# Patient Record
Sex: Male | Born: 1940 | Race: White | Hispanic: No | Marital: Married | Smoking: Never smoker
Health system: Southern US, Community
[De-identification: ages and names within clinical notes are randomized; demographics above are authoritative.]

## PROBLEM LIST (undated history)

## (undated) DIAGNOSIS — R238 Other skin changes: Secondary | ICD-10-CM

## (undated) DIAGNOSIS — IMO0001 Reserved for inherently not codable concepts without codable children: Secondary | ICD-10-CM

## (undated) DIAGNOSIS — M4310 Spondylolisthesis, site unspecified: Secondary | ICD-10-CM

## (undated) DIAGNOSIS — G629 Polyneuropathy, unspecified: Secondary | ICD-10-CM

## (undated) DIAGNOSIS — M1990 Unspecified osteoarthritis, unspecified site: Secondary | ICD-10-CM

## (undated) DIAGNOSIS — M51379 Other intervertebral disc degeneration, lumbosacral region without mention of lumbar back pain or lower extremity pain: Secondary | ICD-10-CM

## (undated) DIAGNOSIS — C801 Malignant (primary) neoplasm, unspecified: Secondary | ICD-10-CM

## (undated) DIAGNOSIS — M069 Rheumatoid arthritis, unspecified: Secondary | ICD-10-CM

## (undated) DIAGNOSIS — Z5189 Encounter for other specified aftercare: Secondary | ICD-10-CM

## (undated) DIAGNOSIS — K5792 Diverticulitis of intestine, part unspecified, without perforation or abscess without bleeding: Secondary | ICD-10-CM

## (undated) DIAGNOSIS — M5137 Other intervertebral disc degeneration, lumbosacral region: Secondary | ICD-10-CM

## (undated) DIAGNOSIS — K222 Esophageal obstruction: Secondary | ICD-10-CM

## (undated) DIAGNOSIS — J189 Pneumonia, unspecified organism: Secondary | ICD-10-CM

## (undated) DIAGNOSIS — R21 Rash and other nonspecific skin eruption: Secondary | ICD-10-CM

## (undated) DIAGNOSIS — Z87898 Personal history of other specified conditions: Secondary | ICD-10-CM

## (undated) DIAGNOSIS — K219 Gastro-esophageal reflux disease without esophagitis: Secondary | ICD-10-CM

## (undated) DIAGNOSIS — M719 Bursopathy, unspecified: Secondary | ICD-10-CM

## (undated) DIAGNOSIS — I639 Cerebral infarction, unspecified: Secondary | ICD-10-CM

## (undated) DIAGNOSIS — N201 Calculus of ureter: Secondary | ICD-10-CM

## (undated) DIAGNOSIS — N400 Enlarged prostate without lower urinary tract symptoms: Secondary | ICD-10-CM

## (undated) DIAGNOSIS — Z8719 Personal history of other diseases of the digestive system: Secondary | ICD-10-CM

## (undated) DIAGNOSIS — Z87442 Personal history of urinary calculi: Secondary | ICD-10-CM

## (undated) DIAGNOSIS — R233 Spontaneous ecchymoses: Secondary | ICD-10-CM

## (undated) HISTORY — PX: COLONOSCOPY: SHX174

## (undated) HISTORY — PX: NISSEN FUNDOPLICATION: SHX2091

## (undated) HISTORY — DX: Spontaneous ecchymoses: R23.3

## (undated) HISTORY — PX: OTHER SURGICAL HISTORY: SHX169

## (undated) HISTORY — DX: Polyneuropathy, unspecified: G62.9

## (undated) HISTORY — DX: Diverticulitis of intestine, part unspecified, without perforation or abscess without bleeding: K57.92

## (undated) HISTORY — PX: HERNIA REPAIR: SHX51

## (undated) HISTORY — DX: Calculus of ureter: N20.1

## (undated) HISTORY — DX: Other skin changes: R23.8

## (undated) HISTORY — DX: Cerebral infarction, unspecified: I63.9

---

## 1997-09-18 ENCOUNTER — Emergency Department (HOSPITAL_COMMUNITY): Admission: EM | Admit: 1997-09-18 | Discharge: 1997-09-18 | Payer: Self-pay | Admitting: Emergency Medicine

## 1997-11-04 ENCOUNTER — Ambulatory Visit (HOSPITAL_COMMUNITY): Admission: RE | Admit: 1997-11-04 | Discharge: 1997-11-04 | Payer: Self-pay | Admitting: Gastroenterology

## 1997-12-09 ENCOUNTER — Inpatient Hospital Stay (HOSPITAL_COMMUNITY): Admission: RE | Admit: 1997-12-09 | Discharge: 1997-12-14 | Payer: Self-pay | Admitting: General Surgery

## 2000-01-10 ENCOUNTER — Encounter: Payer: Self-pay | Admitting: Rheumatology

## 2000-01-10 ENCOUNTER — Encounter: Admission: RE | Admit: 2000-01-10 | Discharge: 2000-01-10 | Payer: Self-pay | Admitting: Rheumatology

## 2000-07-20 ENCOUNTER — Encounter: Payer: Self-pay | Admitting: Emergency Medicine

## 2000-07-20 ENCOUNTER — Emergency Department (HOSPITAL_COMMUNITY): Admission: EM | Admit: 2000-07-20 | Discharge: 2000-07-20 | Payer: Self-pay | Admitting: Emergency Medicine

## 2000-11-06 ENCOUNTER — Inpatient Hospital Stay (HOSPITAL_COMMUNITY): Admission: EM | Admit: 2000-11-06 | Discharge: 2000-11-07 | Payer: Self-pay | Admitting: *Deleted

## 2000-11-06 ENCOUNTER — Encounter: Payer: Self-pay | Admitting: Internal Medicine

## 2000-11-26 ENCOUNTER — Ambulatory Visit (HOSPITAL_COMMUNITY): Admission: RE | Admit: 2000-11-26 | Discharge: 2000-11-26 | Payer: Self-pay | Admitting: Oncology

## 2000-11-26 ENCOUNTER — Encounter: Payer: Self-pay | Admitting: Oncology

## 2000-11-28 ENCOUNTER — Other Ambulatory Visit: Admission: RE | Admit: 2000-11-28 | Discharge: 2000-11-28 | Payer: Self-pay | Admitting: Oncology

## 2000-11-28 ENCOUNTER — Encounter (INDEPENDENT_AMBULATORY_CARE_PROVIDER_SITE_OTHER): Payer: Self-pay | Admitting: Specialist

## 2001-04-17 HISTORY — PX: TOTAL KNEE ARTHROPLASTY: SHX125

## 2001-05-29 ENCOUNTER — Emergency Department (HOSPITAL_COMMUNITY): Admission: EM | Admit: 2001-05-29 | Discharge: 2001-05-29 | Payer: Self-pay | Admitting: Emergency Medicine

## 2001-06-03 ENCOUNTER — Encounter: Payer: Self-pay | Admitting: Orthopedic Surgery

## 2001-06-05 ENCOUNTER — Inpatient Hospital Stay (HOSPITAL_COMMUNITY): Admission: RE | Admit: 2001-06-05 | Discharge: 2001-06-10 | Payer: Self-pay | Admitting: Orthopedic Surgery

## 2001-06-07 ENCOUNTER — Encounter: Payer: Self-pay | Admitting: Orthopedic Surgery

## 2002-03-12 ENCOUNTER — Encounter: Payer: Self-pay | Admitting: Emergency Medicine

## 2002-03-12 ENCOUNTER — Inpatient Hospital Stay (HOSPITAL_COMMUNITY): Admission: EM | Admit: 2002-03-12 | Discharge: 2002-03-13 | Payer: Self-pay | Admitting: Emergency Medicine

## 2002-03-13 ENCOUNTER — Encounter: Payer: Self-pay | Admitting: Geriatric Medicine

## 2002-04-01 ENCOUNTER — Encounter: Payer: Self-pay | Admitting: General Surgery

## 2002-04-01 ENCOUNTER — Ambulatory Visit (HOSPITAL_COMMUNITY): Admission: RE | Admit: 2002-04-01 | Discharge: 2002-04-01 | Payer: Self-pay | Admitting: General Surgery

## 2002-04-08 ENCOUNTER — Encounter: Payer: Self-pay | Admitting: General Surgery

## 2002-04-08 ENCOUNTER — Ambulatory Visit (HOSPITAL_COMMUNITY): Admission: RE | Admit: 2002-04-08 | Discharge: 2002-04-08 | Payer: Self-pay | Admitting: General Surgery

## 2002-08-28 ENCOUNTER — Ambulatory Visit (HOSPITAL_COMMUNITY): Admission: RE | Admit: 2002-08-28 | Discharge: 2002-08-28 | Payer: Self-pay | Admitting: Gastroenterology

## 2003-08-03 ENCOUNTER — Ambulatory Visit (HOSPITAL_COMMUNITY): Admission: RE | Admit: 2003-08-03 | Discharge: 2003-08-03 | Payer: Self-pay | Admitting: Oncology

## 2004-01-28 ENCOUNTER — Ambulatory Visit (HOSPITAL_COMMUNITY): Admission: RE | Admit: 2004-01-28 | Discharge: 2004-01-28 | Payer: Self-pay | Admitting: Gastroenterology

## 2004-01-28 ENCOUNTER — Encounter (INDEPENDENT_AMBULATORY_CARE_PROVIDER_SITE_OTHER): Payer: Self-pay | Admitting: Specialist

## 2004-07-18 ENCOUNTER — Encounter: Admission: RE | Admit: 2004-07-18 | Discharge: 2004-07-18 | Payer: Self-pay | Admitting: Geriatric Medicine

## 2004-07-27 ENCOUNTER — Encounter: Admission: RE | Admit: 2004-07-27 | Discharge: 2004-07-27 | Payer: Self-pay | Admitting: Geriatric Medicine

## 2005-04-17 HISTORY — PX: INGUINAL HERNIA REPAIR: SUR1180

## 2005-11-26 ENCOUNTER — Emergency Department (HOSPITAL_COMMUNITY): Admission: EM | Admit: 2005-11-26 | Discharge: 2005-11-26 | Payer: Self-pay | Admitting: Emergency Medicine

## 2005-11-29 ENCOUNTER — Ambulatory Visit (HOSPITAL_BASED_OUTPATIENT_CLINIC_OR_DEPARTMENT_OTHER): Admission: RE | Admit: 2005-11-29 | Discharge: 2005-11-29 | Payer: Self-pay | Admitting: Urology

## 2005-11-30 ENCOUNTER — Ambulatory Visit (HOSPITAL_COMMUNITY): Admission: RE | Admit: 2005-11-30 | Discharge: 2005-11-30 | Payer: Self-pay | Admitting: Urology

## 2006-01-17 ENCOUNTER — Inpatient Hospital Stay (HOSPITAL_COMMUNITY): Admission: RE | Admit: 2006-01-17 | Discharge: 2006-01-19 | Payer: Self-pay | Admitting: Orthopedic Surgery

## 2006-05-09 ENCOUNTER — Encounter: Admission: RE | Admit: 2006-05-09 | Discharge: 2006-05-09 | Payer: Self-pay | Admitting: Geriatric Medicine

## 2006-06-14 ENCOUNTER — Inpatient Hospital Stay (HOSPITAL_COMMUNITY): Admission: EM | Admit: 2006-06-14 | Discharge: 2006-06-20 | Payer: Self-pay | Admitting: Emergency Medicine

## 2006-06-15 ENCOUNTER — Encounter (INDEPENDENT_AMBULATORY_CARE_PROVIDER_SITE_OTHER): Payer: Self-pay | Admitting: *Deleted

## 2006-08-28 ENCOUNTER — Inpatient Hospital Stay (HOSPITAL_COMMUNITY): Admission: EM | Admit: 2006-08-28 | Discharge: 2006-08-30 | Payer: Self-pay | Admitting: Emergency Medicine

## 2007-04-18 HISTORY — PX: OTHER SURGICAL HISTORY: SHX169

## 2007-07-31 ENCOUNTER — Emergency Department (HOSPITAL_COMMUNITY): Admission: EM | Admit: 2007-07-31 | Discharge: 2007-07-31 | Payer: Self-pay | Admitting: Emergency Medicine

## 2007-09-11 ENCOUNTER — Encounter: Admission: RE | Admit: 2007-09-11 | Discharge: 2007-09-11 | Payer: Self-pay | Admitting: Geriatric Medicine

## 2008-02-19 ENCOUNTER — Ambulatory Visit (HOSPITAL_COMMUNITY): Admission: RE | Admit: 2008-02-19 | Discharge: 2008-02-19 | Payer: Self-pay | Admitting: General Surgery

## 2008-03-24 ENCOUNTER — Ambulatory Visit (HOSPITAL_COMMUNITY): Admission: RE | Admit: 2008-03-24 | Discharge: 2008-03-24 | Payer: Self-pay | Admitting: Gastroenterology

## 2008-04-30 ENCOUNTER — Ambulatory Visit (HOSPITAL_COMMUNITY): Admission: RE | Admit: 2008-04-30 | Discharge: 2008-05-01 | Payer: Self-pay | Admitting: General Surgery

## 2009-09-20 ENCOUNTER — Encounter: Admission: RE | Admit: 2009-09-20 | Discharge: 2009-09-20 | Payer: Self-pay | Admitting: Geriatric Medicine

## 2009-09-29 ENCOUNTER — Encounter: Admission: RE | Admit: 2009-09-29 | Discharge: 2009-09-29 | Payer: Self-pay | Admitting: Geriatric Medicine

## 2009-09-29 ENCOUNTER — Other Ambulatory Visit: Admission: RE | Admit: 2009-09-29 | Discharge: 2009-09-29 | Payer: Self-pay | Admitting: Interventional Radiology

## 2009-12-17 ENCOUNTER — Encounter (HOSPITAL_COMMUNITY): Admission: RE | Admit: 2009-12-17 | Discharge: 2010-01-06 | Payer: Self-pay | Admitting: Rheumatology

## 2010-05-06 ENCOUNTER — Ambulatory Visit (HOSPITAL_BASED_OUTPATIENT_CLINIC_OR_DEPARTMENT_OTHER): Payer: MEDICARE | Admitting: Oncology

## 2010-05-08 ENCOUNTER — Encounter: Payer: Self-pay | Admitting: Geriatric Medicine

## 2010-05-09 ENCOUNTER — Other Ambulatory Visit: Payer: Self-pay | Admitting: Dermatology

## 2010-05-23 ENCOUNTER — Encounter (HOSPITAL_BASED_OUTPATIENT_CLINIC_OR_DEPARTMENT_OTHER): Payer: MEDICARE | Admitting: Oncology

## 2010-05-23 ENCOUNTER — Other Ambulatory Visit: Payer: Self-pay | Admitting: Oncology

## 2010-05-23 DIAGNOSIS — D649 Anemia, unspecified: Secondary | ICD-10-CM

## 2010-05-23 LAB — MORPHOLOGY: PLT EST: ADEQUATE

## 2010-05-23 LAB — CBC & DIFF AND RETIC
Basophils Absolute: 0 10*3/uL (ref 0.0–0.1)
EOS%: 0.3 % (ref 0.0–7.0)
Eosinophils Absolute: 0 10*3/uL (ref 0.0–0.5)
HCT: 29.1 % — ABNORMAL LOW (ref 38.4–49.9)
MCH: 17.9 pg — ABNORMAL LOW (ref 27.2–33.4)
MCHC: 27.1 g/dL — ABNORMAL LOW (ref 32.0–36.0)
MCV: 65.8 fL — ABNORMAL LOW (ref 79.3–98.0)
MONO#: 1 10*3/uL — ABNORMAL HIGH (ref 0.1–0.9)
RBC: 4.42 10*6/uL (ref 4.20–5.82)
RDW: 17.8 % — ABNORMAL HIGH (ref 11.0–14.6)
Retic %: 1.01 % (ref 0.50–1.60)
lymph#: 2.5 10*3/uL (ref 0.9–3.3)

## 2010-05-25 ENCOUNTER — Encounter (HOSPITAL_BASED_OUTPATIENT_CLINIC_OR_DEPARTMENT_OTHER): Payer: MEDICARE | Admitting: Oncology

## 2010-05-25 DIAGNOSIS — D649 Anemia, unspecified: Secondary | ICD-10-CM

## 2010-05-25 LAB — PROTEIN ELECTROPHORESIS, SERUM
Alpha-1-Globulin: 3.5 % (ref 2.9–4.9)
Alpha-2-Globulin: 7.1 % (ref 7.1–11.8)
Beta Globulin: 5.4 % (ref 4.7–7.2)
Total Protein, Serum Electrophoresis: 7.9 g/dL (ref 6.0–8.3)

## 2010-05-25 LAB — IRON AND TIBC: UIBC: 368 ug/dL

## 2010-05-25 LAB — SEDIMENTATION RATE: Sed Rate: 5 mm/hr (ref 0–16)

## 2010-05-25 LAB — VITAMIN B12: Vitamin B-12: 532 pg/mL (ref 211–911)

## 2010-05-25 LAB — FERRITIN: Ferritin: 3 ng/mL — ABNORMAL LOW (ref 22–322)

## 2010-05-26 LAB — IGG, IGA, IGM: IgA: 91 mg/dL (ref 68–378)

## 2010-06-01 ENCOUNTER — Encounter (HOSPITAL_BASED_OUTPATIENT_CLINIC_OR_DEPARTMENT_OTHER): Payer: MEDICARE | Admitting: Oncology

## 2010-06-01 DIAGNOSIS — D649 Anemia, unspecified: Secondary | ICD-10-CM

## 2010-06-02 ENCOUNTER — Encounter (HOSPITAL_BASED_OUTPATIENT_CLINIC_OR_DEPARTMENT_OTHER): Payer: MEDICARE | Admitting: Oncology

## 2010-06-02 ENCOUNTER — Other Ambulatory Visit (HOSPITAL_COMMUNITY)
Admission: RE | Admit: 2010-06-02 | Discharge: 2010-06-02 | Disposition: A | Discharge status: Home / Self Care | Payer: Medicare Other | Source: Ambulatory Visit | Attending: Oncology | Admitting: Oncology

## 2010-06-02 ENCOUNTER — Other Ambulatory Visit: Payer: Self-pay | Admitting: Oncology

## 2010-06-02 DIAGNOSIS — D649 Anemia, unspecified: Secondary | ICD-10-CM

## 2010-06-02 DIAGNOSIS — D472 Monoclonal gammopathy: Secondary | ICD-10-CM | POA: Insufficient documentation

## 2010-06-02 LAB — CBC & DIFF AND RETIC
Basophils Absolute: 0.1 10*3/uL (ref 0.0–0.1)
Eosinophils Absolute: 0.2 10*3/uL (ref 0.0–0.5)
HCT: 32.8 % — ABNORMAL LOW (ref 38.4–49.9)
LYMPH%: 32.1 % (ref 14.0–49.0)
MCH: 18.9 pg — ABNORMAL LOW (ref 27.2–33.4)
MCV: 70.4 fL — ABNORMAL LOW (ref 79.3–98.0)
MONO#: 0.9 10*3/uL (ref 0.1–0.9)
MONO%: 14.7 % — ABNORMAL HIGH (ref 0.0–14.0)
Platelets: 197 10*3/uL (ref 140–400)
RBC: 4.66 10*6/uL (ref 4.20–5.82)
RDW: 23.5 % — ABNORMAL HIGH (ref 11.0–14.6)
Retic %: 1.62 % — ABNORMAL HIGH (ref 0.50–1.60)
Retic Ct Abs: 75.49 10*3/uL (ref 24.10–77.50)
lymph#: 2 10*3/uL (ref 0.9–3.3)

## 2010-06-03 ENCOUNTER — Ambulatory Visit: Payer: MEDICARE | Admitting: Cardiovascular Disease

## 2010-07-20 ENCOUNTER — Other Ambulatory Visit: Payer: Self-pay | Admitting: Oncology

## 2010-07-20 ENCOUNTER — Encounter (HOSPITAL_BASED_OUTPATIENT_CLINIC_OR_DEPARTMENT_OTHER): Payer: MEDICARE | Admitting: Oncology

## 2010-07-20 DIAGNOSIS — D509 Iron deficiency anemia, unspecified: Secondary | ICD-10-CM

## 2010-07-20 DIAGNOSIS — M069 Rheumatoid arthritis, unspecified: Secondary | ICD-10-CM

## 2010-07-20 DIAGNOSIS — D649 Anemia, unspecified: Secondary | ICD-10-CM

## 2010-07-20 LAB — CBC & DIFF AND RETIC
BASO%: 0.3 % (ref 0.0–2.0)
Basophils Absolute: 0 10*3/uL (ref 0.0–0.1)
Immature Retic Fract: 3 % (ref 0.00–13.40)
MCH: 22.3 pg — ABNORMAL LOW (ref 27.2–33.4)
MCHC: 29.2 g/dL — ABNORMAL LOW (ref 32.0–36.0)
MCV: 76.2 fL — ABNORMAL LOW (ref 79.3–98.0)
MONO#: 1.1 10*3/uL — ABNORMAL HIGH (ref 0.1–0.9)
NEUT#: 6.3 10*3/uL (ref 1.5–6.5)
NEUT%: 62.1 % (ref 39.0–75.0)
WBC: 10.1 10*3/uL (ref 4.0–10.3)
lymph#: 2.6 10*3/uL (ref 0.9–3.3)

## 2010-07-20 LAB — MORPHOLOGY: PLT EST: ADEQUATE

## 2010-07-20 LAB — IRON AND TIBC
TIBC: 328 ug/dL (ref 215–435)
UIBC: 310 ug/dL

## 2010-07-28 ENCOUNTER — Encounter (HOSPITAL_BASED_OUTPATIENT_CLINIC_OR_DEPARTMENT_OTHER): Payer: MEDICARE | Admitting: Oncology

## 2010-07-28 DIAGNOSIS — D649 Anemia, unspecified: Secondary | ICD-10-CM

## 2010-08-01 LAB — CBC
MCHC: 30.6 g/dL (ref 30.0–36.0)
MCV: 65.5 fL — ABNORMAL LOW (ref 78.0–100.0)
Platelets: 322 10*3/uL (ref 150–400)
WBC: 7.2 10*3/uL (ref 4.0–10.5)

## 2010-08-01 LAB — DIFFERENTIAL
Basophils Absolute: 0 10*3/uL (ref 0.0–0.1)
Basophils Relative: 0 % (ref 0–1)
Eosinophils Relative: 3 % (ref 0–5)
Lymphocytes Relative: 22 % (ref 12–46)
Lymphs Abs: 1.6 10*3/uL (ref 0.7–4.0)
Monocytes Relative: 15 % — ABNORMAL HIGH (ref 3–12)
Neutro Abs: 4.3 10*3/uL (ref 1.7–7.7)

## 2010-08-01 LAB — COMPREHENSIVE METABOLIC PANEL
AST: 19 U/L (ref 0–37)
Albumin: 3.2 g/dL — ABNORMAL LOW (ref 3.5–5.2)
Calcium: 9.5 mg/dL (ref 8.4–10.5)
Creatinine, Ser: 0.99 mg/dL (ref 0.4–1.5)
GFR calc Af Amer: >60 mL/min (ref >60)

## 2010-08-01 LAB — PROTIME-INR
INR: 1.2 (ref 0.00–1.49)
Prothrombin Time: 15.7 seconds — ABNORMAL HIGH (ref 11.6–15.2)

## 2010-08-02 ENCOUNTER — Inpatient Hospital Stay (HOSPITAL_COMMUNITY)
Admission: EM | Admit: 2010-08-02 | Discharge: 2010-08-06 | DRG: 982 | Disposition: A | Discharge status: Home / Self Care | Payer: MEDICARE | Attending: Internal Medicine | Admitting: Internal Medicine

## 2010-08-02 DIAGNOSIS — Z880 Allergy status to penicillin: Secondary | ICD-10-CM

## 2010-08-02 DIAGNOSIS — K5731 Diverticulosis of large intestine without perforation or abscess with bleeding: Principal | ICD-10-CM | POA: Diagnosis present

## 2010-08-02 DIAGNOSIS — D638 Anemia in other chronic diseases classified elsewhere: Secondary | ICD-10-CM | POA: Diagnosis present

## 2010-08-02 DIAGNOSIS — M069 Rheumatoid arthritis, unspecified: Secondary | ICD-10-CM | POA: Diagnosis present

## 2010-08-02 DIAGNOSIS — IMO0002 Reserved for concepts with insufficient information to code with codable children: Secondary | ICD-10-CM

## 2010-08-02 DIAGNOSIS — D696 Thrombocytopenia, unspecified: Secondary | ICD-10-CM | POA: Diagnosis present

## 2010-08-02 DIAGNOSIS — Z882 Allergy status to sulfonamides status: Secondary | ICD-10-CM

## 2010-08-02 DIAGNOSIS — L299 Pruritus, unspecified: Secondary | ICD-10-CM | POA: Diagnosis not present

## 2010-08-02 DIAGNOSIS — D62 Acute posthemorrhagic anemia: Secondary | ICD-10-CM | POA: Diagnosis present

## 2010-08-02 LAB — POCT I-STAT, CHEM 8
Chloride: 106 mEq/L (ref 96–112)
HCT: 42 % (ref 39.0–52.0)
Hemoglobin: 14.3 g/dL (ref 13.0–17.0)
Potassium: 4.5 mEq/L (ref 3.5–5.1)
Sodium: 141 mEq/L (ref 135–145)

## 2010-08-02 LAB — DIFFERENTIAL
Basophils Absolute: 0 10*3/uL (ref 0.0–0.1)
Eosinophils Relative: 1 % (ref 0–5)
Lymphocytes Relative: 26 % (ref 12–46)
Lymphs Abs: 2.8 10*3/uL (ref 0.7–4.0)
Monocytes Relative: 10 % (ref 3–12)
Neutrophils Relative %: 63 % (ref 43–77)

## 2010-08-02 LAB — CBC
HCT: 29.3 % — ABNORMAL LOW (ref 39.0–52.0)
Hemoglobin: 11.8 g/dL — ABNORMAL LOW (ref 13.0–17.0)
MCH: 23.6 pg — ABNORMAL LOW (ref 26.0–34.0)
MCHC: 30 g/dL (ref 30.0–36.0)
Platelets: 289 10*3/uL (ref 150–400)
Platelets: 245 10*3/uL (ref 150–400)
RBC: 5.01 MIL/uL (ref 4.22–5.81)
RDW: 22 % — ABNORMAL HIGH (ref 11.5–15.5)
WBC: 9.9 10*3/uL (ref 4.0–10.5)

## 2010-08-02 LAB — PREPARE RBC (CROSSMATCH)

## 2010-08-02 LAB — OCCULT BLOOD, POC DEVICE: Fecal Occult Bld: POSITIVE

## 2010-08-03 LAB — CBC
Hemoglobin: 8.2 g/dL — ABNORMAL LOW (ref 13.0–17.0)
MCHC: 31.3 g/dL (ref 30.0–36.0)
MCV: 77.5 fL — ABNORMAL LOW (ref 78.0–100.0)
Platelets: 224 10*3/uL (ref 150–400)
RDW: 21 % — ABNORMAL HIGH (ref 11.5–15.5)
RDW: 21.1 % — ABNORMAL HIGH (ref 11.5–15.5)
WBC: 9.2 10*3/uL (ref 4.0–10.5)
WBC: 7.8 10*3/uL (ref 4.0–10.5)

## 2010-08-03 LAB — HEMOGLOBIN AND HEMATOCRIT, BLOOD
HCT: 24.9 % — ABNORMAL LOW (ref 39.0–52.0)
HCT: 24.6 % — ABNORMAL LOW (ref 39.0–52.0)
Hemoglobin: 7.8 g/dL — ABNORMAL LOW (ref 13.0–17.0)

## 2010-08-03 LAB — BASIC METABOLIC PANEL
BUN: 17 mg/dL (ref 6–23)
GFR calc Af Amer: >60 mL/min (ref >60)
GFR calc non Af Amer: >60 mL/min (ref >60)
Potassium: 4.2 mEq/L (ref 3.5–5.1)

## 2010-08-03 LAB — PREPARE RBC (CROSSMATCH)

## 2010-08-04 ENCOUNTER — Inpatient Hospital Stay (HOSPITAL_COMMUNITY): Payer: MEDICARE

## 2010-08-04 LAB — HEMOGLOBIN AND HEMATOCRIT, BLOOD
HCT: 23 % — ABNORMAL LOW (ref 39.0–52.0)
Hemoglobin: 7.2 g/dL — ABNORMAL LOW (ref 13.0–17.0)
Hemoglobin: 7.8 g/dL — ABNORMAL LOW (ref 13.0–17.0)

## 2010-08-04 LAB — BASIC METABOLIC PANEL
CO2: 25 mEq/L (ref 19–32)
Calcium: 7.8 mg/dL — ABNORMAL LOW (ref 8.4–10.5)
Creatinine, Ser: 0.96 mg/dL (ref 0.4–1.5)
Glucose, Bld: 136 mg/dL — ABNORMAL HIGH (ref 70–99)

## 2010-08-04 LAB — CARDIAC PANEL(CRET KIN+CKTOT+MB+TROPI)
CK, MB: 1.2 ng/mL (ref 0.3–4.0)
CK, MB: 1.7 ng/mL (ref 0.3–4.0)
Relative Index: INVALID (ref 0.0–2.5)
Troponin I: 0.01 ng/mL (ref 0.00–0.06)

## 2010-08-04 MED ORDER — IOHEXOL 300 MG/ML  SOLN
300.0000 mL | Freq: Once | INTRAMUSCULAR | Status: AC | PRN
  Administered 2010-08-04: 65 mL via INTRA_ARTERIAL

## 2010-08-04 NOTE — H&P (Signed)
  NAME:  [name], [name]                ACCOUNT NO.:  [id number]  MEDICAL RECORD NO.:  [id number]           PATIENT TYPE:  E  LOCATION:  [facility]                         FACILITY:  [facility]  PHYSICIAN:  Lonia Blood, M.D.       DATE OF BIRTH:  1941-03-02  DATE OF ADMISSION:  08/02/2010 DATE OF DISCHARGE:                             HISTORY & PHYSICAL   PRIMARY CARE PHYSICIAN:  Hal T. Stoneking, MD  CHIEF COMPLAINT:  Passing blood through the rectum.  HISTORY OF PRESENT ILLNESS:  [name] is a 70 year old gentleman with history of rheumatoid arthritis and multiple abdominal surgeries, lysis of adhesions, recurrent incisional hernias, who presented to the emergency room after he started having significant hematochezia.  It is painless.  He has no fevers.  No chills.  PAST MEDICAL HISTORY: 1. Rheumatoid arthritis. 2. Osteoarthritis. 3. History of kidney stones. 4. History of gastric ulcers. 5. Chronic steroid usage. 6. Left total knee replacement. 7. Benign prostatic hypertrophy. 8. Right inguinal hernia repair. 9. Left inguinal hernia repair. 10.Nissen fundoplication. 11.Recurrent ventral incisional hernia repair in June 2010.  HOME MEDICATIONS:  Prednisone, Tylenol, calcium, multivitamin, and Prilosec.  ALLERGIES:  CLINDAMYCIN, CODEINE, and PENICILLIN.  SOCIAL HISTORY:  He is married, lives with his wife.  FAMILY HISTORY:  Positive for hypertension.  PHYSICAL EXAMINATION:  VITAL SIGNS:  Upon admission, temperature 98.2, blood pressure 109/71, pulse 63, respirations 19, and saturation 93% on room air. GENERAL:  The patient is alert and oriented, in no acute distress. HEAD:  Normocephalic and atraumatic. EYES:  Pupils equal, round reactive to light and accommodation. Extraocular movements are intact. THROAT:  Clear. NECK:  Supple.  No JVD. CHEST:  Clear to auscultation.  No wheeze, rhonchi, or crackles. HEART:  Regular rate and rhythm without murmurs, rubs, or  gallops. ABDOMEN:  Soft.  There is some hyperactive bowel sounds.  Nontender. LOWER EXTREMITIES:  Without edema. SKIN:  Warm and dry.  No suspicious looking rashes.  LABORATORY DATA:  Laboratory values on admission was white blood cell count is 10.9, hemoglobin 11.8, and platelet count is 289.  Sodium is 141, potassium 4.5, chloride 106, bicarbonate is 25, BUN 23, creatinine 1, and glucose 119.  ASSESSMENT/PLAN:  This is a 70 year old gentleman admitted with lower gastrointestinal bleeding, most likely diverticular in nature.  He does not seem to have upper gastrointestinal bleeding due to the fact of lack of vomiting, hemodynamic stability, and color of the blood.  Since he has been on chronic steroids, we will proceed with admission to telemetry unit.  Give him stress dose steroids.  Transfuse him 2 units packed red blood cells and follow closely to see if he is going to require any colonoscopy or upper endoscopy.  Gastroenterologist on call. Dr. Jeani Hawking, has been alerted about this admission by the emergency room physician.     Lonia Blood, M.D.   SL/MEDQ  D:  08/02/2010  T:  08/02/2010  Job:  [id number]  Electronically Signed by Lonia Blood M.D. on 08/04/2010 02:27:18 PM

## 2010-08-05 LAB — VITAMIN B12: Vitamin B-12: 326 pg/mL (ref 211–911)

## 2010-08-05 LAB — IRON AND TIBC
TIBC: 177 ug/dL — ABNORMAL LOW (ref 215–435)
UIBC: 145 ug/dL

## 2010-08-05 LAB — HEMOGLOBIN AND HEMATOCRIT, BLOOD: HCT: 23.7 % — ABNORMAL LOW (ref 39.0–52.0)

## 2010-08-05 LAB — FOLATE: Folate: 11.3 ng/mL

## 2010-08-05 LAB — PREPARE RBC (CROSSMATCH)

## 2010-08-06 LAB — TYPE AND SCREEN
ABO/RH(D): A NEG
Antibody Screen: NEGATIVE
Unit division: 0
Unit division: 0
Unit division: 0
Unit division: 0

## 2010-08-06 LAB — BASIC METABOLIC PANEL
BUN: 11 mg/dL (ref 6–23)
CO2: 26 mEq/L (ref 19–32)
Calcium: 7.8 mg/dL — ABNORMAL LOW (ref 8.4–10.5)
Creatinine, Ser: 0.93 mg/dL (ref 0.4–1.5)
GFR calc non Af Amer: >60 mL/min (ref >60)
Glucose, Bld: 110 mg/dL — ABNORMAL HIGH (ref 70–99)

## 2010-08-06 LAB — CBC
HCT: 23.7 % — ABNORMAL LOW (ref 39.0–52.0)
Hemoglobin: 8.1 g/dL — ABNORMAL LOW (ref 13.0–17.0)
MCH: 27.8 pg (ref 26.0–34.0)
MCHC: 34.2 g/dL (ref 30.0–36.0)
MCV: 81.4 fL (ref 78.0–100.0)
RDW: 18.1 % — ABNORMAL HIGH (ref 11.5–15.5)

## 2010-08-07 NOTE — Consult Note (Signed)
NAME:  [name], [name]                ACCOUNT NO.:  [id number]  MEDICAL RECORD NO.:  [id number]           PATIENT TYPE:  I  LOCATION:  6737                         FACILITY:  [facility]  PHYSICIAN:  Bogard Hawks. Elnoria Howard, MD    DATE OF BIRTH:  1940-12-31  DATE OF CONSULTATION:  08/02/2010 DATE OF DISCHARGE:                                CONSULTATION   REASON FOR CONSULTATION:  Hematochezia.  PRIMARY GASTROINTESTINAL PHYSICIAN:  Danise Edge, MD  PRIMARY CARE PROVIDER:  Hal T. Stoneking, MD  HEMATOLOGIST:  Pierce Crane, MD, FRCPC  HISTORY OF PRESENT ILLNESS:  This is a 70 year old gentleman with a past medical history of rheumatoid arthritis, history of small bowel obstruction status post lysis of adhesions, inguinal hernia repair, ventral hernia repair, history of iron-deficiency anemia, and history of vertigo who was admitted to the hospital with complaints of hematochezia.  The patient states that he started to have pain with hematochezia that was quite prominent starting at 2 p.m.  Prior to this time, he had a couple of bowel movements in the morning where he saw just a slight amount of blood but since 2 p.m., he started to have significant amount of bleeding.  This started to concern him after 2 large bloody bowel movements and subsequently presented to the emergency room for further evaluation and treatment.  While in the emergency room and also the time before he was evaluated, he had a total of 6 other bowel movements which brings a total of 8 bloody bowel movements at this time.  Currently, the patient denies having any further hematochezia and he is hemodynamically stable.  No complaints of any chest pain, abdominal pain, shortness of breath, nausea, vomiting, fevers, or chills.  In 2004, per the patient, he underwent a colonoscopy by Dr. Laural Benes with findings of some diverticula and a prior CT scan in the years prior does confirm that he had sigmoid diverticulosis.  As a result  of his symptoms, GI consultation was requested.  However, this has been an inadvertent consultation as the patient should have been seen by Dr. Laural Benes and or his partners at [facility].  PAST MEDICAL HISTORY:  As stated above.  PAST SURGICAL HISTORY:  As stated above.  FAMILY HISTORY:  Noncontributory.  SOCIAL HISTORY:  Negative for alcohol, tobacco, illicit drug use.  FAMILY HISTORY:  Noncontributory.  REVIEW OF SYSTEMS:  As stated above in history present illness. Otherwise negative.  ALLERGIES:  CODEINE, PENICILLIN, SULFA, and CLINDAMYCIN.  MEDICATIONS: 1. Hydrocortisone 25 mg IV q.12 h. 2. Protonix 40 mg IV daily. 3. Morphine 2 mg IV q.4 h. 4. Zofran 4 mg IV q.6 h.  PHYSICAL EXAMINATION:  VITAL SIGNS:  Blood pressure is 106/61, heart rate is 66, respirations 17, temperature is 97.9. GENERAL:  The patient is in no acute distress, alert and oriented. HEENT:  Normocephalic, atraumatic.  Extraocular muscles intact. NECK:  Supple.  No lymphadenopathy. LUNGS:  Clear to auscultation bilaterally. CARDIOVASCULAR:  Regular rate and rhythm. ABDOMEN:  Mildly obese, soft, nontender, nondistended. EXTREMITIES:  No clubbing, cyanosis, or edema. RECTAL:  Performed in ER.  Deferred at this  time, but is reported to be grossly bloody.  LABORATORY VALUES:  White blood cell count 9.9, hemoglobin 8.8, MCV 76.9, platelets at 246.  Sodium 141, potassium 4.5, chloride 106, glucose is 119, BUN 23, creatinine is 1.0.  Hemoccult positive.  IMPRESSION: 1. Probable diverticular bleed. 2. History of iron-deficiency anemia. 3. Rheumatoid arthritis. 4. History of small bowel obstruction status post lysis of adhesion.     Given the patient's acute onset of hematochezia and confirmation of     diverticula in the sigmoid colon, I believe that this is consistent     with a diverticular bleed.  Currently, he is hemodynamically     stable.  There is no evidence that this may be an ischemic colitis.      The patient has been worked up by Dr. Pierce Crane in the past with     bone marrow biopsy in August 2002 and most recently in February     2012 as he has a persistent microcytic anemia.  The bone marrow     biopsies are essentially negative and he has also had upper     endoscopies by Dr. Laural Benes in 2004 which were negative for celiac     disease.  In fact, the patient was supposed to see Dr. Laural Benes on     August 04, 2010 for an office visit to consider repeat evaluation of     a GI source for his microcytic anemia.  Plan at this time is supportive care.  I believe he will require a repeat colonoscopy with the recent bleeding and the fact that his last colonoscopy was 8 years ago.  He does have a persistent microcytic anemia.  Apparently, Dr. Donnie Coffin was asking Dr. Laural Benes to reevaluate this issue from GI standpoint.  As it stands now, I will relinquish the patient's care tomorrow to Dr. Laural Benes in [facility] and they will finalize the decisions for his management from the GI standpoint.     Legaspi Hawks Elnoria Howard, MD     PDH/MEDQ  D:  08/02/2010  T:  08/03/2010  Job:  [id number]  cc:   Danise Edge, M.D. Hal T. Stoneking, M.D. Pierce Crane, M.D., F.R.C.P.C.  Electronically Signed by Jeani Hawking MD on 08/07/2010 08:54:34 PM

## 2010-08-10 NOTE — Op Note (Signed)
  NAME:  [name], [name]                ACCOUNT NO.:  [id number]  MEDICAL RECORD NO.:  [id number]           PATIENT TYPE:  I  LOCATION:  6737                         FACILITY:  [facility]  PHYSICIAN:  Danise Edge, M.D.   DATE OF BIRTH:  06/18/1940  DATE OF PROCEDURE:  08/04/2010 DATE OF DISCHARGE:                              OPERATIVE REPORT   HISTORY:  [name] is a 70 year old male born on 11/22/1940.  The patient was admitted with acute lower gastrointestinal bleeding approximately 48 hours ago.  He underwent a colonic lavage prep today.  Diagnostic colonoscopy is scheduled.  The patient has undergone Nissen fundoplication performed by Dr. Claud Kelp and has undergone a partial small-bowel resection for adhesions causing small bowel obstruction years ago performed by Dr. Avel Peace.  In 2004, the patient underwent a screening colonoscopy which was normal. 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  ENDOSCOPIST:  Danise Edge, MD  PREMEDICATION:  Fentanyl 100 mcg, Versed 10 mg.  PROCEDURE:  After obtaining informed consent, the patient was placed in the left lateral decubitus position.  Anal inspection and digital rectal exam were normal.  The Pentax pediatric colonoscope was introduced into the rectum and easily advanced to the cecum.  A normal-appearing ileocecal valve and appendiceal orifice were identified.  The entire colon is filled with fresh blood and blood clots.  The patient has universal colonic diverticulosis.  Arterial bleeding from a probable diverticulum was isolated to the hepatic flexure.  The area could not be cleared of blood to deploy an Endoclip or inject with epinephrine.  Interventional Radiology was consulted and the patient transported to Radiology for interventional radiologic control of bleeding from the hepatic flexure.  I also notified Surgery who  will remain on standby if Interventional Radiology fails to control the arterial hepatic flexure bleeding and the patient requires surgery.          ______________________________ Danise Edge, M.D.     MJ/MEDQ  D:  08/04/2010  T:  08/05/2010  Job:  [id number]  cc:   [place] Daphine Deutscher, MD Hal T. Stoneking, M.D.  Electronically Signed by Danise Edge M.D. on 08/10/2010 12:07:44 PM

## 2010-08-24 NOTE — Discharge Summary (Signed)
NAME:  [name], [name]                ACCOUNT NO.:  [id number]  MEDICAL RECORD NO.:  [id number]           PATIENT TYPE:  I  LOCATION:  6737                         FACILITY:  [facility]  PHYSICIAN:  Calvert Cantor, M.D.     DATE OF BIRTH:  11-26-1940  DATE OF ADMISSION:  08/02/2010 DATE OF DISCHARGE:  08/06/2010                              DISCHARGE SUMMARY   PRIMARY CARE PHYSICIAN:  Hal T. Stoneking, MD  PRESENTING COMPLAINT:  Rectal bleed.  DISCHARGE DIAGNOSES: 1. Diverticular bleed status post embolization. 2. Anemia, chronic anemia and acute blood loss anemia status post 6     units of packed red blood cell transfusion.  PAST MEDICAL HISTORY: 1. Rheumatoid arthritis on prednisone. 2. Osteoarthritis. 3. Nephrolithiasis. 4. Gastric ulcers in the past. 5. BPH. 6. Nissen fundoplication.  DISCHARGE MEDICATIONS:  No new medications have been ordered.  The patient is being discharged on the following: 1. Calcium carbonate plus vitamin D 1 tablet daily. 2. Fish oil 1 capsule daily. 3. Miacalcin nasal spray, 1 spray daily. 4. Multivitamin 1 tablet daily. 5. Prednisone 5 mg 1-1/2 to 2 tablets daily. 6. Prilosec 20 mg daily. 7. Tylenol 650 mg twice a day as needed.  PROCEDURES:  The patient had a colonoscopy on August 04, 2010 performed by Dr. Danise Edge which revealed a colon which was filled with fresh blood and clots.  No intervention could be done.  The patient underwent a bead embolization on August 05, 2010 performed by Dr. Arn Medal.  HOSPITAL COURSE:  This is a 70 year old male who came in complaining of large amounts of bright red blood per rectum.  The patient's initial hemoglobin was 8.8 but eventually dropped to 7.8 and then 7.2.  He was transfused a total of 6 units of packed red blood cells over the course of his stay and hemoglobin on discharge has been stable at 8.1.  A GI consult was requested.  The patient was initially seen by Dr. Elnoria Howard however, his  original gastroenterologist is Dr. Laural Benes who evaluated him the subsequent day.  On the 19th, he performed a colonoscopy but could not perform any intervention.  On the 20th, the patient underwent embolization and subsequently stopped passing fresh blood.  He was passing old blood yesterday.  Today he states that the stool is essentially normal but with a slight blood tinge.  He has been advanced to a soft easy-to-digest diet and is doing well.  The patient has been advised to take a high-fiber diet and avoid seeds and nuts but he is advised that this can occur again and he should come to the ER immediately if it was to happen again.  PHYSICAL EXAMINATION ON DISCHARGE:  ABDOMEN:  Soft, nontender, nondistended.  Bowel sounds positive.  No organomegaly. LUNGS:  Clear bilaterally. HEART:  Regular rate and rhythm.  No murmurs.  CONDITION ON DISCHARGE:  Stable.  FOLLOWUP INSTRUCTIONS:  Follow with primary care physician and GI in 1-2 weeks.  Time on discharge was 50 minutes.     Calvert Cantor, M.D.     SR/MEDQ  D:  08/06/2010  T:  08/06/2010  Job:  [id number]  cc:   Hal T. Stoneking, M.D. Danise Edge, M.D.  Electronically Signed by Calvert Cantor M.D. on 08/24/2010 11:02:24 PM

## 2010-08-30 NOTE — H&P (Signed)
NAME:  [name], [name]                ACCOUNT NO.:  [id number]   MEDICAL RECORD NO.:  [id number]          PATIENT TYPE:  INP   LOCATION:  1827                         FACILITY:  [facility]   PHYSICIAN:  Ellie Lunch, M.D.      DATE OF BIRTH:  1941/01/23   DATE OF ADMISSION:  08/28/2006  DATE OF DISCHARGE:                              HISTORY & PHYSICAL   PRIMARY CARE PHYSICIAN:  Dr. Merlene Laughter with [facility] at [place].   REASON FOR ADMISSION:  Vertigo.   HISTORY OF PRESENT ILLNESS:  [name] is a very pleasant 70 year old  white male with a past medical history significant for rheumatoid  arthritis, that comes to the emergency room because of many episodes of  vertigo that started the evening prior to admission.  The patient  initially started having episodes where he would have to room spinning  around him when he got up and would start moving.  However, since 3:30  this morning, as soon as the patient sits up on the bed he is having  episodes of vertigo associated now with nausea and vomiting.  He is  unable to get out of bed or even sit up in bed because of the vertigo.  He denies any symptoms of headaches or focal neurological deficits,  slurred speech, blurry vision, seizures, or any loss of consciousness.  He does complain of ringing in his ears that has been associated with  the vertigo since yesterday evening, but no history of ear pain, ear  discharge, or any evidence of upper respiratory infection.  He felt  healthy up until yesterday evening.  He says that he has a tendency to  fall on his left side whenever he has episodes of vertigo.   PAST MEDICAL HISTORY:  1. History of rheumatoid arthritis for the past 20 years.  He was on      Enbrel up until 2 months ago.  2. Small bowel obstruction and ventral hernia with 3 small bowel      strictures in the mid ileus.  Status post exploratory laparotomy,      lysis of adhesions, small bowel resection, and ventral hernia  repair performed by Dr. Abbey Chatters in February of 2008.  3. History of right knee replacement.  4. Bilateral inguinal hernia repair by Dr. Derrell Lolling.  5. History of kidney stones.  Status post lithotripsy.  6. History of GERD.  7. History of gastric ulcers in the past.   CURRENT MEDICATIONS:  1. Prednisone 5 mg p.o. daily.  2. Motrin 200 mg two pills twice a day.  3. Multivitamin p.o. daily.   ALLERGIES:  CODEINE GIVES HIM HIVES.  PENICILLIN GIVES HIM A RASH.   SOCIAL HISTORY:  He lives with his wife.  He is currently retired,  worked as an Personnel officer.  He is very active, plays golf every day.  Does not give any history of smoking, alcohol, or drug use.   REVIEW OF SYSTEMS:  As per HPI.   PHYSICAL EXAMINATION:  VITAL SIGNS:  On admission, temperature 97.1,  blood pressure 151/83 laying, and 140/78 sitting.  Pulse  55,  respirations 22, O2 sats 98% on two liters.  GENERAL EXAM:  The patient is in no acute distress.  He is able to talk  in full sentences.  HEAD:  Atraumatic, normocephalic.  Pupils equal, round, and reactive to  light and accommodation.  Extraocular movements are intact.  Oropharynx  is clear.  NECK:  Supple.  No JVD.  No bruits.  CARDIOVASCULAR:  Bradycardic with a regular rate and rhythm.  No  murmurs.  CHEST:  Clear to auscultation bilaterally.  No rales, rhonchi, or  wheezing.  ABDOMEN:  Soft.  The patient does have a very well-healed midline  abdominal scar from his surgery performed in February of this year.  Positive bowel sounds.  EXTREMITIES:  No clubbing, cyanosis, or edema.  NEUROLOGICALLY:  Alert and oriented times 4.  Cranial nerves II-XII are  intact.  Muscle strength 5/5 in all extremities.  Sensations are intact.  Cerebellar signs were negative.  The patient does fall on the left side  when he was made to stand.   ADMISSION LABS:  White count 8.8, hemoglobin 10, platelets 360,000, MCV  72.  Sodium 138, potassium 3.1, chloride 104, Bicarb 27,  glucose 144,  BUN 10, creatinine 0.86.  Total protein 6.3.  Albumin 2.9.  SGOT 15,  SGPT 12.   PLAN:  1. Vertigo with tendency to fall on the left side.  We will go ahead      and check a MRI as well as a MRA to look for vertebral basilar      insufficiency.  We will also go ahead and call an ENT consult.  We      will give him p.r.n. meclizine for vertigo and p.r.n. Zofran for      nausea.  2. Rheumatoid arthritis.  We will continue the patient's prednisone      and Motrin.  3. Microcytic anemia.  Will inquire whether the patient has received a      colonoscopy as an outpatient.  Will continue to monitor in the      hospital.  Will check FOBT.      Ellie Lunch, M.D.  Electronically Signed     BP/MEDQ  D:  08/28/2006  T:  08/28/2006  Job:  [id number]   cc:   Hal T. Stoneking, M.D.

## 2010-08-30 NOTE — Op Note (Signed)
NAME:  [name], [name]                ACCOUNT NO.:  [id number]   MEDICAL RECORD NO.:  [id number]          PATIENT TYPE:  OIB   LOCATION:  5151                         FACILITY:  [facility]   PHYSICIAN:  Adolph Pollack, M.D.DATE OF BIRTH:  Aug 30, 1940   DATE OF PROCEDURE:  04/30/2008  DATE OF DISCHARGE:                               OPERATIVE REPORT   PREOPERATIVE DIAGNOSIS:  Recurrent ventral incisional hernia.   POSTOPERATIVE DIAGNOSIS:  Recurrent ventral incisional hernia.   PROCEDURE:  1. Laparoscopic lysis of adhesions (1.5 hours).  2. Laparoscopic repair of recurrent ventral incisional hernia with      mesh.   SURGEON:  Adolph Pollack, MD   ASSISTANT:  Currie Paris, MD   ANESTHESIA:  General.   INDICATIONS:  [name] is a 70 year old male who has had a number for  abdominal procedures.  His last procedure was emergency exploratory  laparotomy for small bowel obstruction requiring small bowel resection.  At that time, he was noted to have a small epigastric ventral incisional  hernia which was primarily repaired, but now has recurred and he  presents now for repair.   TECHNIQUE:  He was seen in the holding area and brought to the operating  room, placed supine on the operating table.  General anesthetic was  administered.  Foley catheter was inserted in the bladder.  The hair on  the abdominal wall was clipped and abdominal wall was sterilely prepped  and draped.   Marcaine solution was infiltrated in the left lateral abdomen.  An  incision was made in the left lateral abdomen to the skin and  subcutaneous tissue.  I then incised the fascial layers and entered the  peritoneal cavity under direct vision.  A purse-string suture of 0  Vicryl was placed around the fascial edges and a Hasson trocar was  induced to the peritoneal cavity.  A pneumoperitoneum was then created  by insufflation of CO2 gas.   Laparoscope was introduced.  There were significant omental  adhesions to  the anterior abdominal wall as well as some small bowel adhesions to the  anterior abdominal wall inferiorly.  I placed a 5-mm trocar in the left  lower quadrant, began dissecting the omentum away from the abdominal  wall partly until I could see the right side.  I then placed a 10-mm  trocar and a 11-mm trocar in the right lateral abdominal wall and  proceeded with adhesiolysis from this side.  I carefully lysed adhesions  between the omentum of the anterior abdominal wall and the  supraumbilical area.  Some bleeding from the omentum was stopped with  electrocautery.  I continued to perform this until I had completely  freed up the omentum from the anterior abdominal wall.  In the  subumbilical area, there was a fairly dense adhesions from the small  bowel to the abdominal wall, which I gently worked down sharply and  bluntly and included some peritoneum on the small bowel in order to have  the small bowel fall away enough for me to put the anchoring tacks and  anchoring sutures  in the hole of the mesh.  I could visualize the  ventral hernia and there were multiple small hernias noted in the upper  midline.   After an hour and half of doing this, I was unable to take a spinal  needle and marked the periphery of the mesh.  I marked 4 cm away from  all areas of the hernia and brought up in appropriate size piece of  Paratex mesh with a nonadherent barrier into the field.  The hernia  defect measured approximately 8 x 16 cm.  A piece of mesh of appropriate  size with appropriate overlap was then chosen and after 8 anchoring  sutures of #1 Novofil were placed around the mesh, he was hydrated.  It  was then rolled up and placed into the abdominal cavity and then  unrolled with a nonadherent barrier facing the viscera.   I then made 8 stab incisions around the area of the hernia and brought  up the anchoring suture across fascial bridges.  I then tied all these  down initially  anchoring the mesh into the anterior abdominal wall.  This appropriately covered the hernia defect with good overlap.  I then  used a spiral tacker to put it outer rim so this tacks in around the  periphery of the mesh and inner rim as well for further anchoring.   After this, I irrigated out the area and evacuated the fluid.  There was  no bleeding noted and all quadrants were inspected.  No small bowel  injury or intestinal injury was noted.   I then removed the trocars and watched the mesh to approximate the  omentum.  The remaining trocars were removed.  The fascial defect with a  Hasson trocar was closed by tightening up using a purse-string suture.   Needle, sponge, and instrument counts were reportedly to be correct.  All skin incisions were then closed with 4-0 Monocryl subcuticular  stitches followed by Steri-Strips and sterile dressings.  He tolerated  the procedure well without any apparent complications and was taken to  the recovery room in satisfactory condition.      Adolph Pollack, M.D.  Electronically Signed     TJR/MEDQ  D:  04/30/2008  T:  05/01/2008  Job:  [id number]   cc:   Hal T. Stoneking, M.D.  Danise Edge, M.D.  Excell Seltzer. Annabell Howells, M.D.

## 2010-09-02 NOTE — Consult Note (Signed)
[place]. [facility]  Patient:    [name], [name]                       MRN: [id number] Proc. Date: 11/06/00 Adm. Date:  [id number] Attending:  Corlis Leak CC:         Tyson Dense, M.D.  Charolett Bumpers III, M.D.   Consultation Report  REASON FOR CONSULTATION:  Evaluate small bowel obstruction.  HISTORY OF PRESENT ILLNESS:  This is a very pleasant 70 year old white man with steroid-dependent rheumatoid arthritis and a past history of peptic ulcer disease and a past history of gastroesophageal reflux disease.  He was admitted to the hospital last night after a 6-8 hour history of severe diffuse abdominal pain, visible abdominal distention, and nausea and retching but he did not vomit.  He states that he had a normal bowel movement yesterday morning and has not had any real change in his bowel habits lately.  He reports that he had a similar episode three months ago and was evaluated in the emergency room and discharged.  He was told that he had a "blockage."  He also notes that he has a small ventral hernia that does not bother him.  He was admitted last night.  X-rays showed a pattern suggesting small bowel obstruction.  A nasogastric tube was placed but has not drained very much. This morning, he feels much better stating that his pain and abdominal distention have completely resolved.  He states that he is passing flatus and is hungry.  PAST MEDICAL HISTORY: 1. He has history of severe rheumatoid arthritis, steroid dependent, followed    by Lennox Pippins. 2. History of gastroesophageal reflux disease.  History of esophageal    stricture which has not been dilated in several years.  He did have open    Nissen fundoplication and left inguinal hernia repair about three or four    years ago and has done well with that, although he still occasionally has    some breakthrough reflux. 3. He has a history of peptic ulcer disease in 1972. 4.  History of kidney stones. 5. History of benign prostatic hypertrophy. 6. History of diverticulosis with colonoscopy in 1994 and a flexible    sigmoidoscopy in 1998. 7. He has had a rectal fissure repair in the distant past.  CURRENT MEDICATIONS: 1. Prednisone 5 mg a day. 2. Plaquenil 200 mg q.d. 3. Folic acid 1 mg q.d. 4. Tylenol b.i.d. 5. Methotrexate 13 mg p.o. weekly and at other times under the direction of    Dr. Lennox Pippins.  ALLERGIES:  PENICILLIN causes a skin rash.  FAMILY HISTORY:  The patient is adopted.  SOCIAL HISTORY:  The patient is married.  He has three children.  Denies the use of tobacco or alcohol.  PHYSICAL EXAMINATION:  GENERAL:  A very pleasant middle-aged gentleman in no distress.  Nasogastric tube is in place and draining minimal fluid.  NECK:  No mass.  LUNGS:  Clear to auscultation.  HEART:  Regular rate and rhythm.  No murmur.  ABDOMEN:  An upper midline scar.  The abdomen is soft, not distended, nontender, no mass.  There is a small ventral hernia in the upper midline incision.  This is easily reducible and nontender.  EXTREMITIES:  Groins are examined.  There is a well-healed scar in the left groin.  No evidence of recurrent inguinal hernia.  LABORATORY DATA:  Hemoglobin 12,100, white blood cell count was  11,800 yesterday but is 8600 this morning.  Potassium is 3.8, glucose 125.  Abdominal x-ray this morning showed essentially resolved small bowel obstruction with minimal small bowel gas and lots of air in the colon.  Clear pattern of improvement.  IMPRESSION: 1. Suspect either resolving small bowel obstruction secondary to adhesions or    resolving motility disorder. 2. Ventral hernia, asymptomatic, doubt that this is the etiology of small    bowel obstruction. 3. Status post open Nissen fundoplication, doing well postoperatively. 4. Status post left inguinal hernia repair, doing well. 5. Esophageal stricture, somewhat symptomatic,  may need dilatation in the    future. 6. Steroid-dependent rheumatoid arthritis.  PLAN:  I advised that we remove the nasogastric tube and allow a clear-liquid diet and see how he does.  I also advised that we discontinue his narcotic.  He should be observed for 24 hours in the hospital to make sure that his gastrointestinal tract function normalizes.  If his symptoms recur, we will need to consider further workup, which may include small bowel follow-through or even possibly laparotomy if he develops a full-blown small bowel obstruction.  If he becomes asymptomatic and resumes gastrointestinal function, I think he can be followed as an outpatient. DD:  11/06/00 TD:  11/06/00 Job: [id number]

## 2010-09-02 NOTE — Discharge Summary (Signed)
NAME:  [name], [name]                ACCOUNT NO.:  [id number]   MEDICAL RECORD NO.:  [id number]          PATIENT TYPE:  INP   LOCATION:  5705                         FACILITY:  [facility]   PHYSICIAN:  Maisie Fus A. Cornett, M.D.DATE OF BIRTH:  1941/03/11   DATE OF ADMISSION:  06/14/2006  DATE OF DISCHARGE:  06/20/2006                               DISCHARGE SUMMARY   OPERATIVE PHYSICIAN:  Dr. Abbey Chatters.   CHIEF COMPLAINT AND REASON FOR ADMISSION:  [name] is a 70 year old  male patient with known history of rheumatoid arthritis, on prednisone  and Enbrel, who presented to the ER after experiencing abdominal pain  for the past 24 hours.  Last normal bowel movements were 24 hours prior,  earlier in the day around 9 o'clock.  Since that time, he has had  increasing nausea, vomiting and abdominal pain.  The pain was improved  somewhat with IV narcotics in the ER.  CT scan of the abdomen was  performed and demonstrated high-grade mid-jejunal/ileal small bowel  obstruction.  In the ER, the patient's white count was 20,000, fever was  101.3 and on exam, his abdomen was distended, but not tense, not very  tender.  No bowel sounds were auscultated.  He had an upper abdominal  ventral hernia from prior laparotomies, but no incarceration of  herniated material.  Otherwise, exam was unremarkable.  The patient was  admitted by Dr. Lindie Spruce with the following by diagnoses:  1. High-grade mid small bowel obstruction.  2. Rheumatoid arthritis, on chronic steroids/Enbrel.  3. Fever of unknown origin.  4. Volume depletion.   HOSPITAL COURSE:  The patient was admitted to the general surgical  floor, where he was started on n.p.o. status, IV fluids an NG tube for  bowel decompression.  Within 24 hours, his abdomen was slightly more  distended.  He was complaining of more diffuse pain and x-rays showed no  improvement in small bowel pattern.  His white count had improved to  13,800; hemoglobin was 10.8.   Urinalysis was negative.  Potassium 4.2,  creatinine 1.02.  He was not passing any flatus as well.  Based on  clinical findings, it was felt that the patient would best be served by  laparotomy procedure for expected lysis of adhesions.   On June 14, 2006, the patient was taken to the OR by Dr. Abbey Chatters  with a preoperative diagnosis of small bowel obstruction and ventral  hernia.  Postoperative diagnosis was the same, except the patient was  also found to have 3 distinct areas of small bowel stricturing not  related to adhesions externally.  The patient underwent exploratory  laparotomy and lysis of adhesions, small bowel resection and ventral  hernia repair.  The patient tolerated the procedure well and was sent  back to the general floor to recover.   Throughout the remainder of the hospitalization, the patient did  remarkably well.  His white count continued to decrease.  His  Foley  catheter was discontinued by postop day #1.  His NG tube was clamped by  postop day #3 and by postop day #4, his  NG tube was removed and he was  started on a clear liquid diet, advanced to full liquids by that evening  and by postop day #5, date of discharge, the patient was tolerating a  solid diet without any abdominal pain, nausea or vomiting.   As noted, the patient had some mild anemia preoperatively.  In  discussing with the patient, he has had prior history of duodenal ulcer  disease and has had iron deficiency anemia in the past for which he has  had to take iron supplementation.  Anemia indices were checked  preoperatively; iron was less than 10, UIBC was 289, B12 602, folate  greater than 20; reticulocyte count was normal.  On postop day #4, the  patient did have CBC checked postoperatively; white count was normal at  8600, but hemoglobin was down to 8.7; some of this was dilutional, but  the patient was started on Nu-Iron 150 mg b.i.d. and this will continue  after discharge.   The  patient also has steroid and immunosuppressant-dependent rheumatoid  arthritis.  The patient was complaining on postoperative day #4 of  severe pain, swelling and limited mobility of the extremities,  especially the right hand.  I had a long discussion with the patient  regarding reinitiation of rheumatological medications.  The patient had  been placed on Solu-Cortef 25 mg IV daily while n.p.o.  His prednisone  was restarted at 5 mg daily on postop day #4, since he was tolerating  clear liquids.  The patient insisted that during prior surgical  procedures including abdominal procedures, exploratory lap and knee  surgeries, he has continued his Enbrel without any difficulty related to  wound incisions or postoperative complications.  The patient states  without taking the Enbrel he cannot move and has significant pain;  prednisone is not adequate.  On postop day #4, all medications were  resumed including the Enbrel.  I did discuss this with Dr. Luisa Hart and  he agreed.  In addition, until Enbrel could be placed on board, the  patient was given Toradol 15 mg IV and by postop day #5, the patient and  wife noted that the patient had marked improvement in hand discomfort  and swelling with the use of the Toradol.  He had not been given any  Enbrel at this time.  The patient did verbalize concerns over the cost  of Enbrel and with Toradol causing marked decrease in symptoms, we were  curious as to whether he could continue this as an outpatient.  I did  recommend using over-the-counter NSAIDs in the immediate postoperative  period along with food and using a PPI such as Prilosec OTC, but  cautioned them regarding history of ulcer disease and taking this  medication along with the prednisone.  I see no reason not to take along  with Enbrel if they wish to continue this medication.   In regards to clinical exam, abdomen was soft, bowel sounds were present.  Stapled area was clean, dry and intact  without any redness,  drainage or wound dehiscence.  Vital Signs:  The patient was afebrile  and vital signs stable.   He did have an electrolyte panel checked on day of discharge; potassium  was 3.8, BUN 12, creatinine 0.81, glucose 95.  Surgical pathology  returned with no evidence of malignancy.   FINAL DISCHARGE DIAGNOSES:  1. Small bowel obstruction secondary to adhesions and small bowel      strictures.  2. status post exploratory laparotomy with lysis  of adhesions and      small bowel resection.  3. severe rheumatoid arthritis, on steroids and immunosuppressant      medication.  4. Iron deficiency anemia, recurrent.  5. History of duodenal ulcer with bleed.  6. Hypokalemia, resolved over oral repletion.   DISCHARGE MEDICATIONS:  1. The patient will resume home medications.  2. Nu-Iron 150 mg b.i.d. for anemia.  The patient has been given a      prescription with 6 refills.  3. Percocet 5/325 one to two tablets every 4 hours as need for pain,      #40 dispensed with no refills.  4. Over-the-counter ibuprofen as directed on bottle, need to take with      food and Prilosec OTC since taking prednisone.   ACTIVITY:  May shower.  No lifting more than 15 pounds for the next 6  weeks.  No driving while taking Percocet.  Increase activity slowly.  May walk up steps.   WOUND CARE:  Pat staple area dry.   DIET:  No restrictions.   FOLLOWUP APPOINTMENTS:  You are to see Dr. Abbey Chatters in the office on  Wednesday, March 12 at 9:45 the morning.   ADDITIONAL INSTRUCTIONS:  You are to call the surgeon if:  A.  Fever more than 101 degrees Fahrenheit.  B.  Nausea, vomiting or diarrhea.  C.  New or increased belly pain.  D.  Redness or drainage from wounds.   Prior to discharge and prior to reinitiating Enbrel, I also had a  discussion with the patient and his wife regarding potential  complications of using immunosuppressant drugs in the immediate  postoperative period,  especially in light that the patient did have a  small bowel resection with bowel anastomosis.  The biggest concern at  that this point would be for an anastomotic leak in addition to wound  infection and wound dehiscence.  I discussed with them signs and  symptoms to watch for including the above-mentioned signs of fever and  abdominal pain that is different, that may indicate a bowel leak.      Allison L. Rennis Harding, N.P.      Thomas A. Cornett, M.D.  Electronically Signed    ALE/MEDQ  D:  06/20/2006  T:  06/20/2006  Job:  [id number]   cc:   Adolph Pollack, M.D.  Hal T. Stoneking, M.D.

## 2010-09-29 ENCOUNTER — Ambulatory Visit (HOSPITAL_COMMUNITY)
Admission: RE | Admit: 2010-09-29 | Discharge: 2010-09-29 | Disposition: A | Discharge status: Home / Self Care | Payer: Medicare Other | Source: Ambulatory Visit | Attending: Gastroenterology | Admitting: Gastroenterology

## 2010-09-29 DIAGNOSIS — M810 Age-related osteoporosis without current pathological fracture: Secondary | ICD-10-CM | POA: Insufficient documentation

## 2010-09-29 DIAGNOSIS — Z8546 Personal history of malignant neoplasm of prostate: Secondary | ICD-10-CM | POA: Insufficient documentation

## 2010-09-29 DIAGNOSIS — Z96659 Presence of unspecified artificial knee joint: Secondary | ICD-10-CM | POA: Insufficient documentation

## 2010-09-29 DIAGNOSIS — R1310 Dysphagia, unspecified: Secondary | ICD-10-CM | POA: Insufficient documentation

## 2010-09-29 DIAGNOSIS — M069 Rheumatoid arthritis, unspecified: Secondary | ICD-10-CM | POA: Insufficient documentation

## 2010-10-03 NOTE — Op Note (Signed)
  NAME:  [name], [name]                ACCOUNT NO.:  [id number]  MEDICAL RECORD NO.:  [id number]  LOCATION:  [facility]                         FACILITY:  [facility]  PHYSICIAN:  Danise Edge, M.D.   DATE OF BIRTH:  [birth year]  DATE OF PROCEDURE:  09/29/2010 DATE OF DISCHARGE:                              OPERATIVE REPORT   PROCEDURE:  Esophagogastroduodenoscopy with esophageal dilation.  REFERRING PHYSICIAN:  Hal T. Stoneking, M.D.  HISTORY:  [name] is a 70 year old male, born 06-Jul-1940.  The patient has undergone Nissen fundoplication in the past.  He has developed esophageal dysphagia.  PAST MEDICAL AND SURGICAL HISTORY: 1. Erosive rheumatoid arthritis. 2. Allergic rhinitis. 3. Osteoporosis. 4. Monoclonal gammopathy, unknown significance. 5. Thyroid cyst. 6. Prostate cancer. 7. Diverticular colonic bleed. 8. Total left knee replacement surgery. 9. Rectal fissure surgery. 10.Inguinal hernia repair, twice. 11.Repair of abdominal wall hernia.  ENDOSCOPIST:  Danise Edge, M.D.  PREMEDICATION: 1. Fentanyl 75 mcg. 2. Versed 7.5 mg.  PROCEDURE:  The patient was placed in the left lateral decubitus position.  The Pentax gastroscope was passed through the posterior hypopharynx into the proximal esophagus without difficulty.  The hypopharynx, larynx and vocal cords appeared normal.  Esophagoscopy:  The proximal mid and lower segments of the esophageal mucosa appeared completely normal.  The squamocolumnar junction is noted at approximately 40 cm from the incisor teeth.  There is no endoscopic evidence for the presence of erosive esophagitis, Barrett's esophagus or esophageal stricture formation.  Gastroscopy:  Retroflex view of the gastric cardia and fundus appears normal.  The fundoplication appears intact.  The gastric body, antrum and pylorus appeared normal.  Duodenoscopy:  No duodenal bulb and descending duodenum appeared normal.  Esophageal dilation:   Using the CRE esophageal balloon dilator, the balloon was inflated to 18 mm at the esophagogastric junction.  Post dilation, there was no indication of a stricture dilation.  ASSESSMENT:  Normal esophagogastroduodenoscopy post Nissen fundoplication.  There is no endoscopic evidence for the presence of erosive esophagitis, Barrett's esophagus, esophageal stricture formation or esophageal obstruction.  I suspect the patient's intermittent esophageal dysphagia is related to the Nissen fundoplication remaining intact and possible esophageal dysmotility.          ______________________________ Danise Edge, M.D.     MJ/MEDQ  D:  09/29/2010  T:  09/29/2010  Job:  [id number]  Electronically Signed by Danise Edge M.D. on 10/03/2010 04:15:57 PM

## 2010-10-28 ENCOUNTER — Other Ambulatory Visit: Payer: Self-pay | Admitting: Oncology

## 2010-10-28 ENCOUNTER — Encounter (HOSPITAL_BASED_OUTPATIENT_CLINIC_OR_DEPARTMENT_OTHER): Payer: Medicare Other | Admitting: Oncology

## 2010-10-28 DIAGNOSIS — D649 Anemia, unspecified: Secondary | ICD-10-CM

## 2010-10-28 LAB — COMPREHENSIVE METABOLIC PANEL
AST: 16 U/L (ref 0–37)
Albumin: 4 g/dL (ref 3.5–5.2)
Alkaline Phosphatase: 57 U/L (ref 39–117)
BUN: 15 mg/dL (ref 6–23)
Calcium: 9.5 mg/dL (ref 8.4–10.5)
Chloride: 104 mEq/L (ref 96–112)
Glucose, Bld: 102 mg/dL — ABNORMAL HIGH (ref 70–99)
Potassium: 4.8 mEq/L (ref 3.5–5.3)
Sodium: 140 mEq/L (ref 135–145)
Total Protein: 7.6 g/dL (ref 6.0–8.3)

## 2010-10-28 LAB — MORPHOLOGY

## 2010-10-28 LAB — CBC & DIFF AND RETIC
EOS%: 0.7 % (ref 0.0–7.0)
Eosinophils Absolute: 0.1 10*3/uL (ref 0.0–0.5)
LYMPH%: 22.7 % (ref 14.0–49.0)
MCH: 23.6 pg — ABNORMAL LOW (ref 27.2–33.4)
MCV: 77.6 fL — ABNORMAL LOW (ref 79.3–98.0)
MONO%: 7.4 % (ref 0.0–14.0)
Platelets: 211 10*3/uL (ref 140–400)
RBC: 4.96 10*6/uL (ref 4.20–5.82)
RDW: 15.8 % — ABNORMAL HIGH (ref 11.0–14.6)
Retic %: 0.39 % — ABNORMAL LOW (ref 0.50–1.60)
Retic Ct Abs: 19.34 10*3/uL — ABNORMAL LOW (ref 24.10–77.50)

## 2010-10-28 LAB — IRON AND TIBC: UIBC: 314 ug/dL

## 2011-01-31 ENCOUNTER — Encounter: Payer: Self-pay | Admitting: Cardiovascular Disease

## 2011-02-02 ENCOUNTER — Other Ambulatory Visit: Payer: Self-pay | Admitting: Physical Medicine and Rehabilitation

## 2011-02-02 DIAGNOSIS — M79604 Pain in right leg: Secondary | ICD-10-CM

## 2011-02-07 ENCOUNTER — Ambulatory Visit
Admission: RE | Admit: 2011-02-07 | Discharge: 2011-02-07 | Disposition: A | Discharge status: Home / Self Care | Payer: Medicare Other | Source: Ambulatory Visit | Attending: Physical Medicine and Rehabilitation | Admitting: Physical Medicine and Rehabilitation

## 2011-02-07 DIAGNOSIS — M79605 Pain in left leg: Secondary | ICD-10-CM

## 2011-04-14 ENCOUNTER — Inpatient Hospital Stay (HOSPITAL_COMMUNITY)
Admission: EM | Admit: 2011-04-14 | Discharge: 2011-04-20 | DRG: 378 | Disposition: A | Discharge status: Home / Self Care | Payer: Medicare Other | Attending: Internal Medicine | Admitting: Internal Medicine

## 2011-04-14 ENCOUNTER — Encounter: Payer: Self-pay | Admitting: Emergency Medicine

## 2011-04-14 DIAGNOSIS — Z881 Allergy status to other antibiotic agents status: Secondary | ICD-10-CM

## 2011-04-14 DIAGNOSIS — D509 Iron deficiency anemia, unspecified: Secondary | ICD-10-CM | POA: Diagnosis present

## 2011-04-14 DIAGNOSIS — Z880 Allergy status to penicillin: Secondary | ICD-10-CM

## 2011-04-14 DIAGNOSIS — Z96659 Presence of unspecified artificial knee joint: Secondary | ICD-10-CM

## 2011-04-14 DIAGNOSIS — K922 Gastrointestinal hemorrhage, unspecified: Secondary | ICD-10-CM | POA: Diagnosis present

## 2011-04-14 DIAGNOSIS — K5731 Diverticulosis of large intestine without perforation or abscess with bleeding: Principal | ICD-10-CM | POA: Diagnosis present

## 2011-04-14 DIAGNOSIS — Z888 Allergy status to other drugs, medicaments and biological substances status: Secondary | ICD-10-CM

## 2011-04-14 DIAGNOSIS — IMO0002 Reserved for concepts with insufficient information to code with codable children: Secondary | ICD-10-CM

## 2011-04-14 DIAGNOSIS — Z79899 Other long term (current) drug therapy: Secondary | ICD-10-CM

## 2011-04-14 DIAGNOSIS — D62 Acute posthemorrhagic anemia: Secondary | ICD-10-CM | POA: Diagnosis present

## 2011-04-14 DIAGNOSIS — K219 Gastro-esophageal reflux disease without esophagitis: Secondary | ICD-10-CM | POA: Diagnosis present

## 2011-04-14 DIAGNOSIS — N400 Enlarged prostate without lower urinary tract symptoms: Secondary | ICD-10-CM | POA: Diagnosis present

## 2011-04-14 DIAGNOSIS — D500 Iron deficiency anemia secondary to blood loss (chronic): Secondary | ICD-10-CM | POA: Diagnosis present

## 2011-04-14 DIAGNOSIS — M069 Rheumatoid arthritis, unspecified: Secondary | ICD-10-CM | POA: Diagnosis present

## 2011-04-14 HISTORY — DX: Benign prostatic hyperplasia without lower urinary tract symptoms: N40.0

## 2011-04-14 HISTORY — DX: Spondylolisthesis, site unspecified: M43.10

## 2011-04-14 HISTORY — DX: Pneumonia, unspecified organism: J18.9

## 2011-04-14 HISTORY — DX: Other intervertebral disc degeneration, lumbosacral region: M51.37

## 2011-04-14 HISTORY — DX: Reserved for inherently not codable concepts without codable children: IMO0001

## 2011-04-14 HISTORY — DX: Bursopathy, unspecified: M71.9

## 2011-04-14 HISTORY — DX: Gastro-esophageal reflux disease without esophagitis: K21.9

## 2011-04-14 HISTORY — DX: Other intervertebral disc degeneration, lumbosacral region without mention of lumbar back pain or lower extremity pain: M51.379

## 2011-04-14 HISTORY — DX: Rheumatoid arthritis, unspecified: M06.9

## 2011-04-14 HISTORY — DX: Encounter for other specified aftercare: Z51.89

## 2011-04-14 HISTORY — DX: Unspecified osteoarthritis, unspecified site: M19.90

## 2011-04-14 LAB — BASIC METABOLIC PANEL
BUN: 17 mg/dL (ref 6–23)
Creatinine, Ser: 0.89 mg/dL (ref 0.50–1.35)
GFR calc non Af Amer: 85 mL/min — ABNORMAL LOW (ref >90)
Glucose, Bld: 92 mg/dL (ref 70–99)
Potassium: 3.7 mEq/L (ref 3.5–5.1)

## 2011-04-14 LAB — CBC
HCT: 39.5 % (ref 39.0–52.0)
HCT: 34.6 % — ABNORMAL LOW (ref 39.0–52.0)
Hemoglobin: 12.3 g/dL — ABNORMAL LOW (ref 13.0–17.0)
Hemoglobin: 10.4 g/dL — ABNORMAL LOW (ref 13.0–17.0)
MCH: 24.3 pg — ABNORMAL LOW (ref 26.0–34.0)
MCHC: 31.1 g/dL (ref 30.0–36.0)
MCV: 78.1 fL (ref 78.0–100.0)
WBC: 8.4 10*3/uL (ref 4.0–10.5)

## 2011-04-14 MED ORDER — SODIUM CHLORIDE 0.9 % IV SOLN: INTRAVENOUS | Status: DC

## 2011-04-14 MED ORDER — TRAZODONE 25 MG HALF TABLET
25.0000 mg | ORAL_TABLET | Freq: Every evening | ORAL | Status: DC | PRN
  Filled 2011-04-14: qty 1

## 2011-04-14 MED ORDER — SODIUM CHLORIDE 0.9 % IV SOLN
INTRAVENOUS | Status: DC
  Administered 2011-04-14 – 2011-04-19 (×9): via INTRAVENOUS

## 2011-04-14 MED ORDER — PREDNISONE (PAK) 5 MG PO TABS: 7.5000 mg | ORAL_TABLET | Freq: Every day | ORAL | Status: DC

## 2011-04-14 MED ORDER — PANTOPRAZOLE SODIUM 40 MG PO TBEC
40.0000 mg | DELAYED_RELEASE_TABLET | Freq: Every day | ORAL | Status: DC
  Administered 2011-04-14 – 2011-04-19 (×6): 40 mg via ORAL
  Filled 2011-04-14 (×6): qty 1

## 2011-04-14 MED ORDER — CALCIUM CARBONATE-VITAMIN D 500-200 MG-UNIT PO TABS
1.0000 | ORAL_TABLET | Freq: Every day | ORAL | Status: DC
  Administered 2011-04-14 – 2011-04-20 (×7): 1 via ORAL
  Filled 2011-04-14 (×7): qty 1

## 2011-04-14 MED ORDER — SODIUM CHLORIDE 0.9 % IJ SOLN: 3.0000 mL | INTRAMUSCULAR | Status: DC | PRN

## 2011-04-14 MED ORDER — SODIUM CHLORIDE 0.9 % IV SOLN: 250.0000 mL | INTRAVENOUS | Status: DC | PRN

## 2011-04-14 MED ORDER — ACETAMINOPHEN 650 MG RE SUPP: 650.0000 mg | Freq: Four times a day (QID) | RECTAL | Status: DC | PRN

## 2011-04-14 MED ORDER — PROSIGHT PO TABS
1.0000 | ORAL_TABLET | Freq: Every day | ORAL | Status: DC
  Administered 2011-04-14 – 2011-04-20 (×7): 1 via ORAL
  Filled 2011-04-14 (×8): qty 1

## 2011-04-14 MED ORDER — ACETAMINOPHEN 325 MG PO TABS: 650.0000 mg | ORAL_TABLET | Freq: Four times a day (QID) | ORAL | Status: DC | PRN

## 2011-04-14 MED ORDER — SODIUM CHLORIDE 0.9 % IJ SOLN: 3.0000 mL | Freq: Two times a day (BID) | INTRAMUSCULAR | Status: DC

## 2011-04-14 MED ORDER — PREDNISONE 5 MG PO TABS
7.5000 mg | ORAL_TABLET | Freq: Every day | ORAL | Status: DC
  Administered 2011-04-14 – 2011-04-20 (×7): 7.5 mg via ORAL
  Filled 2011-04-14 (×8): qty 1

## 2011-04-14 NOTE — ED Notes (Signed)
Attempted to call report to floor; floor unable to take report and to call back

## 2011-04-14 NOTE — ED Notes (Signed)
PT. REPORTS RECTAL BLEEDING ONSET 2 AM THIS MORNING ,   ABDOMINAL CRAMPING , STATES HISTORY OF BLEEDING COLON IN THE PAST .

## 2011-04-14 NOTE — ED Notes (Signed)
Pt was noted to have another episode of  Bright red rectal bleeding.  Noted to have lost about 200cc of blood.

## 2011-04-14 NOTE — H&P (Signed)
Hospital Admission Note Date: 04/14/2011  Patient name: [name]           Medical record number: [id number] Date of birth: 07-Sep-1940           Age: 70 y.o.   Gender: male    PCP:   Ginette Otto, MD, MD   Chief Complaint:  BRBPR  HPI: [name] is a 70 y.o. male with past medical history of rheumatoid arthritis on chronic steroids and a history of significant lower GI bleed in April 2012 treated with embolization. At that time required transfusion of 6 units of packed RBCs. Patient was in his usual state of health all about 2:00 in the morning. He felt the urge to go to the bathroom and he had bowel movement, since then he had 4 bloody bowel movements. Patient denies any nausea, vomiting, hematemesis, recent weight loss, abdominal pain or fever and chills. Patient did not have any lightheadedness or dizziness and because of his previous history of lower GI bleed he came into the emergency department for further evaluation. Upon initial evaluation in the emergency department patient was found to have hemoglobin of 12.3, since last night he total of 5 bloody bowel movements. GI was notified patient will be admitted for further evaluation.  Past Medical History: Past Medical History  Diagnosis Date  . Rheumatoid arthritis   . Osteoarthritis   . Bursitis   . BPH (benign prostatic hyperplasia)    Past Surgical History  Procedure Date  . Hernia repair   . Total knee arthroplasty   . Nissen fundoplication     Medications: Prior to Admission medications   Medication Sig Start Date End Date Taking? Authorizing Provider  acetaminophen (TYLENOL) 325 MG tablet Take 1,300 mg by mouth 2 (two) times daily.     Yes Historical Provider, MD  calcium-vitamin D (OSCAL WITH D) 500-200 MG-UNIT per tablet Take 1 tablet by mouth daily.     Yes Historical Provider, MD  multivitamin (PROSIGHT) TABS Take 1 tablet by mouth daily.     Yes Historical Provider, MD  omeprazole (PRILOSEC) 20  MG capsule Take 20 mg by mouth daily.     Yes Historical Provider, MD  predniSONE (STERAPRED UNI-PAK) 5 MG TABS Take 7.5 mg by mouth daily.     Yes Historical Provider, MD    Allergies:   Allergies  Allergen Reactions  . Clindamycin/Lincomycin Hives and Swelling  . Codeine Hives and Swelling  . Penicillins Hives and Swelling    Social History:  reports that he has never smoked. He does not have any smokeless tobacco history on file. He reports that he does not drink alcohol or use illicit drugs.  Family History: History reviewed. No pertinent family history.  Review of Systems:  Constitutional: negative for anorexia, fevers and sweats Eyes: negative for irritation, redness and visual disturbance Ears, nose, mouth, throat, and face: negative for earaches, epistaxis, nasal congestion and sore throat Respiratory: negative for cough, dyspnea on exertion, sputum and wheezing Cardiovascular: negative for chest pain, dyspnea, lower extremity edema, orthopnea, palpitations and syncope Gastrointestinal: negative for abdominal pain, constipation, diarrhea, melena, nausea and vomiting Genitourinary:negative for dysuria, frequency and hematuria Hematologic/lymphatic: negative for bleeding, easy bruising and lymphadenopathy Musculoskeletal:negative for arthralgias, muscle weakness and stiff joints Neurological: negative for coordination problems, gait problems, headaches and weakness Endocrine: negative for diabetic symptoms including polydipsia, polyuria and weight loss Allergic/Immunologic: negative for anaphylaxis, hay fever and urticaria  Physical Exam: BP 108/64  Pulse 55  Temp(Src) 97.7 F (36.5 C) (Oral)  Resp 18  SpO2 93% General appearance: alert, cooperative and no distress  Head: Normocephalic, without obvious abnormality, atraumatic  Eyes: conjunctivae/corneas clear. PERRL, EOM's intact. Fundi benign.  Nose: Nares normal. Septum midline. Mucosa normal. No drainage or sinus  tenderness.  Throat: lips, mucosa, and tongue normal; teeth and gums normal  Neck: Supple, no masses, no cervical lymphadenopathy, no JVD appreciated, no meningeal signs Resp: clear to auscultation bilaterally  Chest wall: no tenderness  Cardio: regular rate and rhythm, S1, S2 normal, no murmur, click, rub or gallop  GI: soft, non-tender; bowel sounds normal; no masses, no organomegaly  Extremities: extremities normal, atraumatic, no cyanosis or edema  Skin: Skin color, texture, turgor normal. No rashes or lesions  Neurologic: Alert and oriented X 3, normal strength and tone. Normal symmetric reflexes. Normal coordination and gait  Labs on Admission:   Basename 04/14/11 0600  NA 138  K 3.7  CL 103  CO2 26  GLUCOSE 92  BUN 17  CREATININE 0.89  CALCIUM 9.1  MG --  PHOS --   Basename 04/14/11 0600  WBC 9.1  NEUTROABS --  HGB 12.3*  HCT 39.5  MCV 78.1  PLT 252   Radiological Exams on Admission: No results found.   IMPRESSION: Present on Admission:  .Lower GI bleed .Rheumatoid arthritis .Iron deficiency anemia  Assessment/Plan  1. Lower GI bleed: Likely lower GI bleed. Patient has history of diverticulosis and history of lower GI bleed treated with embolization in April of 2012. Gastroenterology service already notified. Patient will be n.p.o. except for medications with sips of water. Denies any NSAID use, blood thinners he is on chronic steroids.  2. Anemia, acute or chronic: Patient does have chronic iron deficiency anemia. And 10/28/2010 his iron is 28 and his ferritin is 10. Patient is not on iron supplementation, but if he cannot tolerate oral iron can get IV InFeD before discharge.  3. Rheumatoid arthritis: Per notes erosive arthritis, patient is on chronic steroids. He said the dose to 7.5-10 mg depending on his aches and pains. He's been on it for more than 20 years. 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 If blood pressure drops  probably will need a stress dose of steroids. I will continue the prednisone for now.  Armanie Ullmer A 04/14/2011, 8:05 AM

## 2011-04-14 NOTE — ED Provider Notes (Addendum)
History     CSN: [id number]  Arrival date & time 04/14/11  0509   None     Chief Complaint  Patient presents with  . Rectal Bleeding     HPI The patient reports development of acute onset right rib blood per rectum this morning at approximately 2 AM.  He has had 5 episodes of bright red blood per rectum.  He has a prior history of diverticular bleeds that required interventional radiology treatment with a coil in April 2012.  He reports no abdominal pain.  He denies nausea and vomiting.  He denies fevers and chills.  Reports no lightheadedness weakness or syncope.  Nothing worsens the symptoms.  Nothing improves his symptoms.  His symptoms are intermittent.  He is not on anticoagulants.   Past Medical History  Diagnosis Date  . Rheumatoid arthritis   . Osteoarthritis   . Bursitis     Past Surgical History  Procedure Date  . Hernia repair   . Colon resection   . Total knee arthroplasty     No family history on file.  History  Substance Use Topics  . Smoking status: Never Smoker   . Smokeless tobacco: Not on file  . Alcohol Use: No      Review of Systems  All other systems reviewed and are negative.    Allergies  Clindamycin/lincomycin; Codeine; and Penicillins  Home Medications   Current Outpatient Rx  Name Route Sig Dispense Refill  . ACETAMINOPHEN 325 MG PO TABS Oral Take 1,300 mg by mouth 2 (two) times daily.      Marland Kitchen CALCIUM CARBONATE-VITAMIN D 500-200 MG-UNIT PO TABS Oral Take 1 tablet by mouth daily.      Marland Kitchen PROSIGHT PO TABS Oral Take 1 tablet by mouth daily.      Marland Kitchen OMEPRAZOLE 20 MG PO CPDR Oral Take 20 mg by mouth daily.      Marland Kitchen PREDNISONE (PAK) 5 MG PO TABS Oral Take 7.5 mg by mouth daily.        BP 147/76  Pulse 69  Temp(Src) 97.7 F (36.5 C) (Oral)  Resp 17  SpO2 98%  Physical Exam  Nursing note and vitals reviewed. Constitutional: He is oriented to person, place, and time. He appears well-developed and well-nourished.  HENT:  Head:  Normocephalic and atraumatic.  Eyes: EOM are normal.  Neck: Normal range of motion.  Cardiovascular: Normal rate, regular rhythm, normal heart sounds and intact distal pulses.   Pulmonary/Chest: Effort normal and breath sounds normal. No respiratory distress.  Abdominal: Soft. He exhibits no distension. There is no tenderness.  Genitourinary:       Bright red blood on gloved finger.  Nontender rectal exam  Musculoskeletal: Normal range of motion.  Neurological: He is alert and oriented to person, place, and time.  Skin: Skin is warm and dry.  Psychiatric: He has a normal mood and affect. Judgment normal.    ED Course  Procedures (including critical care time)  Labs Reviewed  CBC - Abnormal; Notable for the following:    Hemoglobin 12.3 (*)    MCH 24.3 (*)    All other components within normal limits  BASIC METABOLIC PANEL  PROTIME-INR  TYPE AND SCREEN   No results found.   1. Lower GI bleed       MDM  Patient with lower GI bleed who will require admission for serial CBCs and observation.  His hemoglobin and vital signs are normal this time.  We'll contact the hospitalist for  admission  6:40 AM Spoke with Dr Evette Cristal with [facility] who will evaluate the patient in the hospital. Spoke with Dr Conley Rolls, [facility] who will admit       Lyanne Co, MD 04/14/11 1610  Lyanne Co, MD 04/14/11 [phone]

## 2011-04-14 NOTE — Progress Notes (Signed)
Pt has had two bowel movements: first was a small/moderate amount of blood with clots, the second was a scant amount of blood with one quarter sized blood clot. Second bowel movement was considerably smaller than the fist.

## 2011-04-14 NOTE — ED Notes (Signed)
Pt reports that he has had 5 episodes of bright red bleeding out of his rectum.  States that he can feel blood building up in his rectum, he goes to the bathroom and it is bright red blood.  Reports that he had the same symptoms a year ago and had an arterial bleed in his colon that had a coil placed to repair the problem.  Pt reports some lightheadness this morning.  Denies pain or nausea.  Skin warm, dry and intact.  Neuro intact.

## 2011-04-14 NOTE — Consult Note (Signed)
[facility] Consult Note  Referring Provider: No ref. provider found Primary Care Physician:  Ginette Otto, MD, MD Primary Gastroenterologist:  Dr.  Chief Complaint: Painless rectal bleeding HPI: [name] is an 70 y.o. white male.  Who presents with the onset of painless hematochezia at about 2 AM with approximately 5 bowel movements often with blood and clots. He has no pain and no lightheadedness or near syncope. 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 Interventional radiology was consulted and did an arteriogram with placement of coils that stopped the bleeding. He has had no further bleeding until today.  Past Medical History  Diagnosis Date  . Rheumatoid arthritis   . Osteoarthritis   . Bursitis   . BPH (benign prostatic hyperplasia)   . Blood transfusion     " no reaction from transfusion"  . GERD (gastroesophageal reflux disease)   . Pneumonia   . H/O hiatal hernia     Past Surgical History  Procedure Date  . Hernia repair   . Total knee arthroplasty   . Nissen fundoplication     Medications Prior to Admission  Medication Dose Route Frequency Provider Last Rate Last Dose  . 0.9 %  sodium chloride infusion   Intravenous Continuous Mutaz A Elmahi 100 mL/hr at 04/14/11 1311    . acetaminophen (TYLENOL) tablet 650 mg  650 mg Oral Q6H PRN Mutaz A Elmahi       Or  . acetaminophen (TYLENOL) suppository 650 mg  650 mg Rectal Q6H PRN Mutaz A Elmahi      . calcium-vitamin D (OSCAL WITH D) 500-200 MG-UNIT per tablet 1 tablet  1 tablet Oral Daily Mutaz A Elmahi      . multivitamin (PROSIGHT) tablet 1 tablet  1 tablet Oral Daily Mutaz A Elmahi      . pantoprazole (PROTONIX) EC tablet 40 mg  40 mg Oral Q1200 Mutaz A Elmahi      . predniSONE (DELTASONE) tablet 7.5 mg  7.5 mg Oral Q breakfast Sloan Burch  Fleming, PHARMD      . traZODone (DESYREL) tablet 25 mg  25 mg Oral QHS PRN Mutaz A Elmahi      . DISCONTD: 0.9 %  sodium chloride infusion  250 mL Intravenous PRN Lyanne Co, MD      . DISCONTD: 0.9 %  sodium chloride infusion   Intravenous STAT Lyanne Co, MD      . DISCONTD: predniSONE (STERAPRED UNI-PAK) tablet 10 mg  10 mg Oral Daily Mutaz A Elmahi      . DISCONTD: sodium chloride 0.9 % injection 3 mL  3 mL Intravenous Q12H Lyanne Co, MD      . DISCONTD: sodium chloride 0.9 % injection 3 mL  3 mL Intravenous PRN Lyanne Co, MD       No current outpatient prescriptions on file as of 04/14/2011.    Allergies:  Allergies  Allergen Reactions  . Clindamycin/Lincomycin Hives and Swelling  . Codeine Hives and Swelling  . Penicillins Hives and Swelling    History reviewed. No pertinent family history.  Social History:  reports that he has never smoked. He has never used smokeless tobacco. He reports that he does not drink alcohol or use illicit drugs.  Negative except for the above   Blood pressure 114/66, pulse 56, temperature 97.7 F (  36.5 C), temperature source Oral, resp. rate 18, SpO2 93.00%. Head: Normocephalic, without obvious abnormality, atraumatic Neck: no adenopathy, no carotid bruit, no JVD, supple, symmetrical, trachea midline and thyroid not enlarged, symmetric, no tenderness/mass/nodules Resp: clear to auscultation bilaterally Cardio: regular rate and rhythm, S1, S2 normal, no murmur, click, rub or gallop GI: Abdomen soft nondistended with normoactive bowel sounds no hepatosplenomegaly mass or guarding Extremities: extremities normal, atraumatic, no cyanosis or edema  Results for orders placed during the hospital encounter of 04/14/11 (from the past 48 hour(s))  CBC     Status: Abnormal   Collection Time   04/14/11  6:00 AM      Component Value Range Comment   WBC 9.1  4.0 - 10.5 (K/uL)    RBC 5.06  4.22 - 5.81 (MIL/uL)    Hemoglobin 12.3 (*)  13.0 - 17.0 (g/dL)    HCT 45.4  09.8 - 11.9 (%)    MCV 78.1  78.0 - 100.0 (fL)    MCH 24.3 (*) 26.0 - 34.0 (pg)    MCHC 31.1  30.0 - 36.0 (g/dL)    RDW 14.7  82.9 - 56.2 (%)    Platelets 252  150 - 400 (K/uL)   BASIC METABOLIC PANEL     Status: Abnormal   Collection Time   04/14/11  6:00 AM      Component Value Range Comment   Sodium 138  135 - 145 (mEq/L)    Potassium 3.7  3.5 - 5.1 (mEq/L)    Chloride 103  96 - 112 (mEq/L)    CO2 26  19 - 32 (mEq/L)    Glucose, Bld 92  70 - 99 (mg/dL)    BUN 17  6 - 23 (mg/dL)    Creatinine, Ser 1.30  0.50 - 1.35 (mg/dL)    Calcium 9.1  8.4 - 10.5 (mg/dL)    GFR calc non Af Amer 85 (*) >90 (mL/min)    GFR calc Af Amer >90  >90 (mL/min)   PROTIME-INR     Status: Normal   Collection Time   04/14/11  6:00 AM      Component Value Range Comment   Prothrombin Time 15.0  11.6 - 15.2 (seconds)    INR 1.16  0.00 - 1.49    TYPE AND SCREEN     Status: Normal   Collection Time   04/14/11  6:15 AM      Component Value Range Comment   ABO/RH(D) A NEG      Antibody Screen NEG      Sample Expiration 04/17/2011      No results found.  Assessment: Painless lower GI bleeding likely from diverticulosis Plan:  Conservative approach initially with monitoring of stools and hemoglobin. If brisk bleeding continues will need to decide whether to attempt colonoscopy or to proceed directly to pooled RBC scan with followup angiography if positive results. At this point his hemoglobin and vital signs are stable. I would favor radiologic approach since his cecum was reached within the last year and no lesions other than diverticuli were seen. We'll follow with you. Anniston Nellums C 04/14/2011, 1:43 PM

## 2011-04-15 LAB — BASIC METABOLIC PANEL
CO2: 28 mEq/L (ref 19–32)
GFR calc non Af Amer: 86 mL/min — ABNORMAL LOW (ref >90)
Glucose, Bld: 87 mg/dL (ref 70–99)
Potassium: 3.8 mEq/L (ref 3.5–5.1)
Sodium: 139 mEq/L (ref 135–145)

## 2011-04-15 LAB — CBC
HCT: 31.3 % — ABNORMAL LOW (ref 39.0–52.0)
Hemoglobin: 10.6 g/dL — ABNORMAL LOW (ref 13.0–17.0)
MCH: 24.1 pg — ABNORMAL LOW (ref 26.0–34.0)
MCH: 24.2 pg — ABNORMAL LOW (ref 26.0–34.0)
MCV: 78 fL (ref 78.0–100.0)
MCV: 78.1 fL (ref 78.0–100.0)
Platelets: 191 10*3/uL (ref 150–400)
RBC: 4.4 MIL/uL (ref 4.22–5.81)
RBC: 4.01 MIL/uL — ABNORMAL LOW (ref 4.22–5.81)

## 2011-04-15 LAB — FERRITIN: Ferritin: 10 ng/mL — ABNORMAL LOW (ref 22–322)

## 2011-04-15 LAB — IRON AND TIBC
Iron: 12 ug/dL — ABNORMAL LOW (ref 42–135)
UIBC: 274 ug/dL (ref 125–400)

## 2011-04-15 LAB — RETICULOCYTES: Retic Count, Absolute: 43.8 10*3/uL (ref 19.0–186.0)

## 2011-04-15 MED ORDER — SODIUM CHLORIDE 0.9 % IV SOLN
1000.0000 mg | Freq: Once | INTRAVENOUS | Status: AC
  Administered 2011-04-15: 1000 mg via INTRAVENOUS
  Filled 2011-04-15: qty 20

## 2011-04-15 MED ORDER — SODIUM CHLORIDE 0.9 % IV SOLN
1250.0000 mg | Freq: Once | INTRAVENOUS | Status: DC
  Filled 2011-04-15: qty 25

## 2011-04-15 NOTE — Progress Notes (Signed)
DAILY PROGRESS NOTE                              GENERAL INTERNAL MEDICINE [facility]  SUBJECTIVE: Had about 4 bowel movements since admission, still has some blurred but overall it's clearing.  OBJECTIVE: BP 132/83  Pulse 63  Temp(Src) 98.2 F (36.8 C) (Oral)  Resp 18  Ht 5\' 6"  (1.676 m)  Wt 80.831 kg (178 lb 3.2 oz)  BMI 28.76 kg/m2  SpO2 96%  Intake/Output Summary (Last 24 hours) at 04/15/11 0941 Last data filed at 04/15/11 1610  Gross per 24 hour  Intake   1040 ml  Output    801 ml  Net    239 ml                      Weight change:  Physical Exam: General: Alert and awake oriented x3 not in any acute distress. HEENT: anicteric sclera, pupils equal reactive to light and accommodation CVS: S1-S2 heard, no murmur rubs or gallops Chest: clear to auscultation bilaterally, no wheezing rales or rhonchi Abdomen:  normal bowel sounds, soft, nontender, nondistended, no organomegaly Neuro: Cranial nerves II-XII intact, no focal neurological deficits Extremities: no cyanosis, no clubbing or edema noted bilaterally   Lab Results:  Basename 04/15/11 0720 04/14/11 0600  NA 139 138  K 3.8 3.7  CL 105 103  CO2 28 26  GLUCOSE 87 92  BUN 14 17  CREATININE 0.86 0.89  CALCIUM 9.0 9.1  MG -- --  PHOS -- --   Basename 04/14/11 1749 04/14/11 0600  WBC 8.4 9.1  NEUTROABS -- --  HGB 10.4* 12.3*  HCT 34.6* 39.5  MCV 78.1 78.1  PLT 211 252   Basename 04/14/11 1320  TSH 0.778  T4TOTAL --  T3FREE --  THYROIDAB --   Studies/Results: No results found. Medications: Scheduled Meds:   . calcium-vitamin D  1 tablet Oral Daily  . multivitamin  1 tablet Oral Daily  . pantoprazole  40 mg Oral Q1200  . predniSONE  7.5 mg Oral Q breakfast  . DISCONTD: sodium chloride   Intravenous STAT  . DISCONTD: predniSONE  10 mg Oral Daily   Continuous Infusions:   . sodium chloride 100 mL/hr at 04/14/11 1311   PRN Meds:.acetaminophen, acetaminophen, traZODone  ASSESSMENT &  PLAN: Active Problems:  Lower GI bleed  Rheumatoid arthritis  Long-term current use of steroids  Iron deficiency anemia  1. GI bleed: Likely lower GI bleed in origin, patient did have history of significant lower GI bleed needed embolization. He does have diverticulosis this is might be diverticular in origin. Its clearing now. Patient is still on clear liquids, hemoglobin from this morning pending. If it's okay with GI, diet might be advanced.  2. Rheumatoid arthritis: Erosive arthritis on long-term steroids without continue.  3. Anemia: Chronic iron deficiency anemia exacerbated by acute blood loss. Had low iron and ferritin in July of this year, he said he has sensitive stomach for oral iron. because he cannot get oral iron I'll give IV iron, because of very low iron in his body. He will need multivitamin on discharge.  4. Long-term use of steroids: Continue the steroids. If patient developed hypotension might need stress dose.    LOS: 1 day   Abass Misener A 04/15/2011, 9:41 AM

## 2011-04-15 NOTE — Progress Notes (Signed)
[name] 1:19 PM  Subjective: The patient has no complaints and no abdominal pain and we reviewed his history and his previous diverticular bleeding admission and he was not on any aspirin or nonsteroidals at home and believes his bleeding is slowing down and clotting and he has no other complaints  Objective: Vital signs stable afebrile abdomen is soft nontender hemoglobin stable  Assessment: Probable diverticular bleeding  Plan: Continue clear liquid nuclear bleeding scan when necessary may advance diet tomorrow if no signs of further bleeding  Vernice Mannina E

## 2011-04-16 LAB — CBC
HCT: 31.5 % — ABNORMAL LOW (ref 39.0–52.0)
Hemoglobin: 9.7 g/dL — ABNORMAL LOW (ref 13.0–17.0)
MCH: 24 pg — ABNORMAL LOW (ref 26.0–34.0)
MCH: 24.3 pg — ABNORMAL LOW (ref 26.0–34.0)
MCHC: 30.8 g/dL (ref 30.0–36.0)
MCHC: 31 g/dL (ref 30.0–36.0)
MCV: 78 fL (ref 78.0–100.0)
Platelets: 189 K/uL (ref 150–400)
Platelets: 208 10*3/uL (ref 150–400)
RBC: 4.04 MIL/uL — ABNORMAL LOW (ref 4.22–5.81)
RBC: 4.07 MIL/uL — ABNORMAL LOW (ref 4.22–5.81)
RDW: 15.5 % (ref 11.5–15.5)
RDW: 15.7 % — ABNORMAL HIGH (ref 11.5–15.5)
WBC: 7 K/uL (ref 4.0–10.5)

## 2011-04-16 NOTE — Progress Notes (Signed)
DAILY PROGRESS NOTE                              GENERAL [facility]NAL [facility]  SUBJECTIVE: No Bowel movements since yesterday.  OBJECTIVE: BP 136/75  Pulse 69  Temp(Src) 97.8 F (36.6 C) (Oral)  Resp 17  Ht 5\' 6"  (1.676 m)  Wt 80.4 kg (177 lb 4 oz)  BMI 28.61 kg/m2  SpO2 95%  Intake/Output Summary (Last 24 hours) at 04/16/11 0918 Last data filed at 04/16/11 0859  Gross per 24 hour  Intake 5798.33 ml  Output   1900 ml  Net 3898.33 ml                      Weight change: -7.8 kg (-17 lb 3.1 oz) Physical Exam: General: Alert and awake oriented x3 not in any acute distress. HEENT: anicteric sclera, pupils equal reactive to light and accommodation CVS: S1-S2 heard, no murmur rubs or gallops Chest: clear to auscultation bilaterally, no wheezing rales or rhonchi Abdomen:  normal bowel sounds, soft, nontender, nondistended, no organomegaly Neuro: Cranial nerves II-XII intact, no focal neurological deficits Extremities: no cyanosis, no clubbing or edema noted bilaterally   Lab Results:  Basename 04/15/11 0720 04/14/11 0600  NA 139 138  K 3.8 3.7  CL 105 103  CO2 28 26  GLUCOSE 87 92  BUN 14 17  CREATININE 0.86 0.89  CALCIUM 9.0 9.1  MG -- --  PHOS -- --    [facility] 04/15/11 2145 04/15/11 0931  WBC 6.9 7.7  NEUTROABS -- --  HGB 9.7* 10.6*  HCT 31.3* 34.3*  MCV 78.1 78.0  PLT 191 202    Basename 04/14/11 1320  TSH 0.778  T4TOTAL --  T3FREE --  THYROIDAB --   Studies/Results: No results found. Medications: Scheduled Meds:    . calcium-vitamin D  1 tablet Oral Daily  . iron dextran (INFED/DEXFERRUM) infusion  1,000 mg Intravenous Once  . multivitamin  1 tablet Oral Daily  . pantoprazole  40 mg Oral Q1200  . predniSONE  7.5 mg Oral Q breakfast  . DISCONTD: iron dextran (INFED/DEXFERRUM) infusion  1,250 mg Intravenous Once   Continuous Infusions:    . sodium chloride 100 mL/hr at 04/15/11 2311   PRN Meds:.acetaminophen, acetaminophen,  traZODone  ASSESSMENT & PLAN: Active Problems:  Lower GI bleed  Rheumatoid arthritis  Long-term current use of steroids  Iron deficiency anemia   1. GI bleed: Likely lower GI bleed in origin, patient did have history of significant lower GI bleed needed embolization. He does have diverticulosis this is might be diverticular in origin. Its clearing now. Patient is still on clear liquids, will advance her diet to a regular diet. If tolerated can be discharged later today or tomorrow morning.  2. Rheumatoid arthritis: Erosive arthritis on long-term steroids without continue.  3. Anemia: Chronic iron deficiency anemia exacerbated by acute blood loss. Had low iron and ferritin in July of this year, he said he has sensitive stomach for oral iron. IV iron given. Total of 1 gm. He will need multivitamin on discharge.  4. Long-term use of steroids: Continue the steroids. If patient developed hypotension might need stress dose.    LOS: 2 days   Ludmilla Mcgillis A 04/16/2011, 9:18 AM

## 2011-04-16 NOTE — Progress Notes (Signed)
[name] 9:13 AM  Subjective: Patient has no signs of bleeding and has not had a bowel movement since yesterday morning and has no abdominal pain and wants to eat and go home  Objective: Vital signs stable afebrile abdomen is soft nontender no a.m. hemoglobin minimal drop last night  Assessment: GI bleeding probably diverticuli  Plan: Slowly advance diet I tried to convince him he needed to stay 1 more day just to be sure but will leave time of discharge to hospitalist and please call us if we can be of any further assistance otherwise can followup with his primary gastroenterologist [name] in the office in one to 2 weeks  [facility] E

## 2011-04-16 NOTE — Progress Notes (Signed)
Small,dark colored loose stool noted.

## 2011-04-17 ENCOUNTER — Inpatient Hospital Stay (HOSPITAL_COMMUNITY): Payer: Medicare Other

## 2011-04-17 LAB — CBC
Hemoglobin: 8.9 g/dL — ABNORMAL LOW (ref 13.0–17.0)
MCHC: 30.8 g/dL (ref 30.0–36.0)
Platelets: 196 10*3/uL (ref 150–400)
Platelets: 207 10*3/uL (ref 150–400)
RBC: 3.67 MIL/uL — ABNORMAL LOW (ref 4.22–5.81)
RDW: 16 % — ABNORMAL HIGH (ref 11.5–15.5)
WBC: 8 10*3/uL (ref 4.0–10.5)
WBC: 7.7 10*3/uL (ref 4.0–10.5)

## 2011-04-17 LAB — PREPARE RBC (CROSSMATCH)

## 2011-04-17 MED ORDER — TECHNETIUM TC 99M-LABELED RED BLOOD CELLS IV KIT
25.0000 | PACK | Freq: Once | INTRAVENOUS | Status: AC | PRN
  Administered 2011-04-17: 25 via INTRAVENOUS

## 2011-04-17 NOTE — Progress Notes (Signed)
Patient had two more episodes of dark blood stool,total of 700 cc.BP 126/71 HR 64, T 98.0, R 18, O2 sat 94 on RA. On call MD,M Lynch,NP notified.Will await for 0:600 CBC result per MD.Will continue to monitor patient. Kermit Balo Joselita 04/17/2011

## 2011-04-17 NOTE — Progress Notes (Signed)
DAILY PROGRESS NOTE                              [facility]AL [facility]  SUBJECTIVE: Patient started on the regular diet yesterday. Had 5 bloody bowel movement since then. He also felt lightheaded this morning.  OBJECTIVE: BP 103/63  Pulse 79  Temp(Src) 98.3 F (36.8 C) (Oral)  Resp 18  Ht 5\' 6"  (1.676 m)  Wt 80.377 kg (177 lb 3.2 oz)  BMI 28.60 kg/m2  SpO2 96%  Intake/Output Summary (Last 24 hours) at 04/17/11 1208 Last data filed at 04/17/11 0900  Gross per 24 hour  Intake 3448.33 ml  Output   3201 ml  Net 247.33 ml                      Weight change: -0.023 kg (-0.8 oz) Physical Exam: General: Alert and awake oriented x3 not in any acute distress. HEENT: anicteric sclera, pupils equal reactive to light and accommodation CVS: S1-S2 heard, no murmur rubs or gallops Chest: clear to auscultation bilaterally, no wheezing rales or rhonchi Abdomen:  normal bowel sounds, soft, nontender, nondistended, no organomegaly Neuro: Cranial nerves II-XII intact, no focal neurological deficits Extremities: no cyanosis, no clubbing or edema noted bilaterally   Lab Results:  [facility] 04/15/11 0720  NA 139  K 3.8  CL 105  CO2 28  GLUCOSE 87  BUN 14  CREATININE 0.86  CALCIUM 9.0  MG --  PHOS --    Basename 04/17/11 1045 04/17/11 0201  WBC 7.7 8.0  NEUTROABS -- --  HGB 7.8* 8.9*  HCT 25.3* 28.7*  MCV 78.6 78.2  PLT 207 196    Basename 04/14/11 1320  TSH 0.778  T4TOTAL --  T3FREE --  THYROIDAB --   Studies/Results: No results found. Medications: Scheduled Meds:    . calcium-vitamin D  1 tablet Oral Daily  . multivitamin  1 tablet Oral Daily  . pantoprazole  40 mg Oral Q1200  . predniSONE  7.5 mg Oral Q breakfast   Continuous Infusions:    . sodium chloride 100 mL/hr at 04/17/11 0547   PRN Meds:.acetaminophen, acetaminophen, traZODone  ASSESSMENT & PLAN: Active Problems:  Lower GI bleed  Rheumatoid arthritis  Long-term current use  of steroids  Iron deficiency anemia   1. GI bleed: Likely lower GI bleed in origin, patient did have significant lower GI bleed in the past required embolization. The low GI bleed seems like it was stopped. When patient restarts her diet had multiple bowel movements it has fresh blood and blood clots. Patient hemoglobin dropped from 9.7 to 7.8. We'll continue the PPI I will notify GI about possible rebleeding.  2. Rheumatoid arthritis: Erosive arthritis on long-term steroids without continue.  3. Anemia: Chronic iron deficiency anemia exacerbated by acute blood loss. Had 1 g of iron infused. Over the past 24 hours hemoglobin dropped to 7.8 from 9.7. I will transfuse 2 units of packed RBCs. As patient has symptoms with lightheadedness.  4. Long-term use of steroids: Continue the steroids. If patient developed hypotension might need stress dose.    LOS: 3 days   Quindarrius Joplin A 04/17/2011, 12:08 PM

## 2011-04-17 NOTE — Progress Notes (Signed)
Utilization review complete 

## 2011-04-17 NOTE — Progress Notes (Signed)
Patient had bloody stools x2,450 cc total.Patient's Vitas BP 137/79 HR 62 T 97.8 RR 18 O2 sat 94% on RA.Patient complains of feeling  woozy,advised pt to call whenever he needs to use the bathroom.Bedside commode in the room.M. Lynch,NP notified.Will continue to monitor patient. [name] 04/17/2011

## 2011-04-17 NOTE — Progress Notes (Signed)
04/17/11 @ 21:06 received call from NM: positive hepatic flexure of the colon. Result sent via text to Donnamarie Poag on call for [facility]. Steele Berg RN

## 2011-04-18 ENCOUNTER — Inpatient Hospital Stay (HOSPITAL_COMMUNITY): Payer: Medicare Other

## 2011-04-18 ENCOUNTER — Encounter (HOSPITAL_COMMUNITY): Payer: Self-pay | Admitting: General Surgery

## 2011-04-18 DIAGNOSIS — K922 Gastrointestinal hemorrhage, unspecified: Secondary | ICD-10-CM

## 2011-04-18 LAB — CBC
HCT: 26.3 % — ABNORMAL LOW (ref 39.0–52.0)
HCT: 26.1 % — ABNORMAL LOW (ref 39.0–52.0)
HCT: 24.1 % — ABNORMAL LOW (ref 39.0–52.0)
Hemoglobin: 8.4 g/dL — ABNORMAL LOW (ref 13.0–17.0)
Hemoglobin: 8.4 g/dL — ABNORMAL LOW (ref 13.0–17.0)
MCH: 25.5 pg — ABNORMAL LOW (ref 26.0–34.0)
MCH: 26.1 pg (ref 26.0–34.0)
MCHC: 31.9 g/dL (ref 30.0–36.0)
MCV: 79.5 fL (ref 78.0–100.0)
Platelets: 181 10*3/uL (ref 150–400)
RBC: 3.3 MIL/uL — ABNORMAL LOW (ref 4.22–5.81)
RBC: 3.28 MIL/uL — ABNORMAL LOW (ref 4.22–5.81)
RBC: 3.03 MIL/uL — ABNORMAL LOW (ref 4.22–5.81)
RDW: 16.4 % — ABNORMAL HIGH (ref 11.5–15.5)
WBC: 8.3 10*3/uL (ref 4.0–10.5)

## 2011-04-18 LAB — TYPE AND SCREEN
ABO/RH(D): A NEG
Antibody Screen: NEGATIVE
Unit division: 0
Unit division: 0

## 2011-04-18 LAB — BASIC METABOLIC PANEL
BUN: 11 mg/dL (ref 6–23)
CO2: 22 mEq/L (ref 19–32)
Chloride: 110 mEq/L (ref 96–112)
Glucose, Bld: 106 mg/dL — ABNORMAL HIGH (ref 70–99)
Potassium: 3.3 mEq/L — ABNORMAL LOW (ref 3.5–5.1)
Sodium: 140 mEq/L (ref 135–145)

## 2011-04-18 MED ORDER — MIDAZOLAM HCL 5 MG/5ML IJ SOLN
INTRAMUSCULAR | Status: AC | PRN
  Administered 2011-04-18: 1 mg via INTRAVENOUS
  Administered 2011-04-18: 0.5 mg via INTRAVENOUS

## 2011-04-18 MED ORDER — FENTANYL CITRATE 0.05 MG/ML IJ SOLN
INTRAMUSCULAR | Status: AC | PRN
  Administered 2011-04-18: 50 ug via INTRAVENOUS
  Administered 2011-04-18: 25 ug via INTRAVENOUS

## 2011-04-18 NOTE — ED Notes (Signed)
Needs to void, urinal given.

## 2011-04-18 NOTE — Progress Notes (Signed)
Subjective: Multiple bloody bowel movements this morning. Nuclear medicine study last night showed active bleeding in hepatic flexure region.  Objective: Vital signs in last 24 hours: Temp:  [97.7 F (36.5 C)-98.5 F (36.9 C)] 97.7 F (36.5 C) (01/01 0514) Pulse Rate:  [63-80] 80  (01/01 0900) Resp:  [16-20] 18  (01/01 0900) BP: (94-138)/(58-76) 94/63 mmHg (01/01 0900) SpO2:  [95 %-96 %] 96 % (01/01 0900) Weight change:  Last BM Date: 04/17/11  PE: GEN:  NAD, frustrated, does not appear acutely toxic ABD:  Soft, hyperactive bowel sounds  Lab Results: Results for orders placed during the hospital encounter of 04/14/11 (from the past 48 hour(s))  CBC     Status: Abnormal   Collection Time   04/16/11 11:05 AM      Component Value Range Comment   WBC 7.0  4.0 - 10.5 (K/uL)    RBC 4.04 (*) 4.22 - 5.81 (MIL/uL)    Hemoglobin 9.7 (*) 13.0 - 17.0 (g/dL)    HCT 16.1 (*) 09.6 - 52.0 (%)    MCV 78.0  78.0 - 100.0 (fL)    MCH 24.0 (*) 26.0 - 34.0 (pg)    MCHC 30.8  30.0 - 36.0 (g/dL)    RDW 04.5  40.9 - 81.1 (%)    Platelets 189  150 - 400 (K/uL)   CBC     Status: Abnormal   Collection Time   04/16/11  5:36 PM      Component Value Range Comment   WBC 8.2  4.0 - 10.5 (K/uL)    RBC 4.07 (*) 4.22 - 5.81 (MIL/uL)    Hemoglobin 9.9 (*) 13.0 - 17.0 (g/dL)    HCT 91.4 (*) 78.2 - 52.0 (%)    MCV 78.4  78.0 - 100.0 (fL)    MCH 24.3 (*) 26.0 - 34.0 (pg)    MCHC 31.0  30.0 - 36.0 (g/dL)    RDW 95.6 (*) 21.3 - 15.5 (%)    Platelets 208  150 - 400 (K/uL)   CBC     Status: Abnormal   Collection Time   04/17/11  2:01 AM      Component Value Range Comment   WBC 8.0  4.0 - 10.5 (K/uL)    RBC 3.67 (*) 4.22 - 5.81 (MIL/uL)    Hemoglobin 8.9 (*) 13.0 - 17.0 (g/dL)    HCT 08.6 (*) 57.8 - 52.0 (%)    MCV 78.2  78.0 - 100.0 (fL)    MCH 24.3 (*) 26.0 - 34.0 (pg)    MCHC 31.0  30.0 - 36.0 (g/dL)    RDW 46.9 (*) 62.9 - 15.5 (%)    Platelets 196  150 - 400 (K/uL)   CBC     Status: Abnormal   Collection Time   04/17/11 10:45 AM      Component Value Range Comment   WBC 7.7  4.0 - 10.5 (K/uL)    RBC 3.22 (*) 4.22 - 5.81 (MIL/uL)    Hemoglobin 7.8 (*) 13.0 - 17.0 (g/dL)    HCT 52.8 (*) 41.3 - 52.0 (%)    MCV 78.6  78.0 - 100.0 (fL)    MCH 24.2 (*) 26.0 - 34.0 (pg)    MCHC 30.8  30.0 - 36.0 (g/dL)    RDW 24.4 (*) 01.0 - 15.5 (%)    Platelets 207  150 - 400 (K/uL)   PREPARE RBC (CROSSMATCH)     Status: Normal   Collection Time   04/17/11 12:30 PM  Component Value Range Comment   Order Confirmation ORDER PROCESSED BY BLOOD BANK     CBC     Status: Abnormal   Collection Time   04/18/11  1:15 AM      Component Value Range Comment   WBC 8.6  4.0 - 10.5 (K/uL)    RBC 3.30 (*) 4.22 - 5.81 (MIL/uL)    Hemoglobin 8.4 (*) 13.0 - 17.0 (g/dL)    HCT 16.1 (*) 09.6 - 52.0 (%)    MCV 79.7  78.0 - 100.0 (fL)    MCH 25.5 (*) 26.0 - 34.0 (pg)    MCHC 31.9  30.0 - 36.0 (g/dL)    RDW 04.5 (*) 40.9 - 15.5 (%)    Platelets 187  150 - 400 (K/uL)   CBC     Status: Abnormal   Collection Time   04/18/11  9:18 AM      Component Value Range Comment   WBC 8.3  4.0 - 10.5 (K/uL)    RBC 3.28 (*) 4.22 - 5.81 (MIL/uL)    Hemoglobin 8.4 (*) 13.0 - 17.0 (g/dL)    HCT 81.1 (*) 91.4 - 52.0 (%)    MCV 79.6  78.0 - 100.0 (fL)    MCH 25.6 (*) 26.0 - 34.0 (pg)    MCHC 32.2  30.0 - 36.0 (g/dL)    RDW 78.2 (*) 95.6 - 15.5 (%)    Platelets 203  150 - 400 (K/uL)     Studies/Results: Nm Gi Blood Loss  04/17/2011  *RADIOLOGY REPORT*  Clinical Data: Lower GI bleed.  NUCLEAR MEDICINE GASTROINTESTINAL BLEEDING STUDY  Technique:  Sequential abdominal images were obtained following intravenous administration of Tc-49m labeled red blood cells.  Radiopharmaceutical: 27 mCi technetium tagged to RBCs.  Comparison: CT scan dated 06/14/2006  Findings: There is evidence of active GI bleeding in the region of the hepatic flexure of the colon at 105 minutes.  No other abnormalities.  IMPRESSION: Active GI bleeding  detected in the right upper quadrant, probably in the hepatic flexure of the colon. 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  Critical Value/emergent results were called by telephone at the time of interpretation on 04/17/2011  at 8:56 p.m.  to  Lurena Joiner, RN, who verbally acknowledged these results.  Original Report Authenticated By: Gwynn Burly, M.D.     Assessment:  1.  Hematochezia with history of diverticulosis. 2.  Nuclear medicine study positive for bleeding in region of hepatic flexure.  Plan:  1.  I have discussed with Dr. Arthor Captain, who will consult interventional radiology.  Patient would likely best benefit from interventional radiology evaluation for angiogram with possible embolization. 2.  I have discussed with Dr. Dwain Sarna with [facility], who will see patient as well.  Would likely reserve surgical therapy for bleeding persistence despite angioembolization.   [name] 04/18/2011, 10:16 AM

## 2011-04-18 NOTE — Procedures (Signed)
Procedure:  SMA arteriography Access:  Right CFA, 5Fr sheath Findings:  Unable to find source of active GI bleed w/ selective SMA angio.  No recurrent bleed identified at hepatic flexure near region of previous embolization.  Attempted catheterization of IMA unsuccessful.

## 2011-04-18 NOTE — Progress Notes (Signed)
DAILY PROGRESS NOTE                              GENERAL [facility]NAL [facility]IC[facility]  SUBJECTIVE: Still has bloody bowel movements, had 4-5 since yesterday. Tagged RBC scan done last night showed active bleeding in the hepatic flexure.  OBJECTIVE: BP 94/63  Pulse 80  Temp(Src) 97.7 F (36.5 C) (Oral)  Resp 18  Ht 5\' 6"  (1.676 m)  Wt 80.377 kg (177 lb 3.2 oz)  BMI 28.60 kg/m2  SpO2 96%  Intake/Output Summary (Last 24 hours) at 04/18/11 1134 Last data filed at 04/18/11 1101  Gross per 24 hour  Intake 2196.67 ml  Output   1159 ml  Net 1037.67 ml                      Weight change:  Physical Exam: General: Alert and awake oriented x3 not in any acute distress. HEENT: anicteric sclera, pupils equal reactive to light and accommodation CVS: S1-S2 heard, no murmur rubs or gallops Chest: clear to auscultation bilaterally, no wheezing rales or rhonchi Abdomen:  normal bowel sounds, soft, nontender, nondistended, no organomegaly Neuro: Cranial nerves II-XII intact, no focal neurological deficits Extremities: no cyanosis, no clubbing or edema noted bilaterally   Lab Results:  [facility] 04/18/11 0918  NA 140  K 3.3*  CL 110  CO2 22  GLUCOSE 106*  BUN 11  CREATININE 0.93  CALCIUM 8.3*  MG --  PHOS --    Basename 04/18/11 0918 04/18/11 0115  WBC 8.3 8.6  NEUTROABS -- --  HGB 8.4* 8.4*  HCT 26.1* 26.3*  MCV 79.6 79.7  PLT 203 187   Studies/Results: No results found. Medications: Scheduled Meds:    . calcium-vitamin D  1 tablet Oral Daily  . multivitamin  1 tablet Oral Daily  . pantoprazole  40 mg Oral Q1200  . predniSONE  7.5 mg Oral Q breakfast   Continuous Infusions:    . sodium chloride 100 mL/hr at 04/18/11 0020   PRN Meds:.acetaminophen, acetaminophen, technetium labeled red blood cells, traZODone  ASSESSMENT & PLAN: Active Problems:  Lower GI bleed  Rheumatoid arthritis  Long-term current use of steroids  Iron deficiency anemia   1. GI  bleed: Likely lower GI bleed in origin, patient did have significant lower GI bleed in the past required coiling of colic branch of SMA in 08/04/2010.Marland Kitchen Patient still has bloody bowel movement, tagged RBC scan done last night showed active bleeding in the hepatic flexure. Dr. Dulce Sellar from gastroenterology is following. He recommended interventional radiology consultation. And he already consulted Gen. surgery for evaluation for possible surgical intervention.  I spoke with Dr. Fredia Sorrow from interventional radiology pager [phone] and he recommended to wait till general surgery decide about surgical intervention. His justification that the bleeding in the same area (hepatic flexure) that bled before earlier in April this year. Repeat coiling or embolization for the same arterial system has a higher risk of ischemia and complications. I spoke with Will Marlyne Beards PA-C. from Gen. surgery to notify me about their decision for surgery versus embolization.  2. Rheumatoid arthritis: Erosive arthritis on long-term steroids without continue.  3. Anemia: Chronic iron deficiency anemia exacerbated by acute blood loss. Had 1 g of iron infused. Over the past 24 hours hemoglobin dropped to 7.8 from 9.7. I will transfuse 2 units of packed RBCs. He hemoglobin improved to 8.4 after transfusion.  4. Long-term use  of steroids: Continue the steroids. If patient developed hypotension might need stress dose.    LOS: 4 days   Kendell Gammon A 04/18/2011, 11:34 AM

## 2011-04-18 NOTE — Consult Note (Signed)
Reason for Consult:Rectal Bleeding Referring Physician: Outlaw [name] is an 71 y.o. male.  HPI: Patient is a 70 year old gentleman with significant history of rheumatoid arthritis, he has been steroid dependent, and on immunosuppressive for 27 years. He was admitted to the [facility] 08/02/2010 with rectal bleeding. Workup included a colonoscopy which showed fresh blood and clots no intervention to be performed. He then underwent arteriogram and coil embolization of the right colic branch of the SMA 08/04/2010. Bleeding was confirmed at the hepatic flexure and this was well controlled with the coil. He's done well until 04/14/2011. He had a bowel movement at 2:30 AM and it seemed normal. He then had recurrent bowel movement in the early a.m. which were bloody. He was admitted to the hospital at [facility] on 12/28. He was seen by GI and they recommended conservative treatment. On admission his hemoglobin/hematocrit 9.7/31.3.  He has continued to have bleeding with a drop in his hemoglobin to 7.8 and hematocrit of 25.3. He's been transfused with 2 units of packed cells, his last hemoglobin was 8.4 with a hematocrit of 26.1. GI recommended nuclear medicine GI bleeding study which was performed yesterday showing active GI bleeding the right upper quadrant probably the right hepatic flexure of the colon. This is judged to be the same area of his previous bleed and embolization. Interventional radiology as been contacted and asked to intervene. It was their recommendation that they discussed this with the surgery with concerns that a repeat embolization of this area might cause severe ischemia. General surgery as been asked to see in consultation.  Past Medical History  Diagnosis Date  . Rheumatoid arthritis     27 years  . Osteoarthritis   . Bursitis     chronic hip pain  . BPH (benign prostatic hyperplasia)   . Blood transfusion     " no reaction from transfusion"  . GERD  (gastroesophageal reflux disease)   . Pneumonia   . H/O hiatal hernia   . Disc disease, degenerative, lumbar or lumbosacral     Chronic back pain now  . Spondylisthesis     Past Surgical History  Procedure Date  . Hernia repair   . Total knee arthroplasty     Bilateral  . Nissen fundoplication   . Inguinal hernia repair     Bilateral   . Lithotripsy for nephrolithiasis   . Lysis of adhesion, small bowel resection     2008  . Ventral hernia repair with mesh     2009    History reviewed. No pertinent family history.  Social History:  reports that he has never smoked. He has never used smokeless tobacco. He reports that he does not drink alcohol or use illicit drugs.  Allergies:  Allergies  Allergen Reactions  . Clindamycin/Lincomycin Hives and Swelling  . Codeine Hives and Swelling  . Penicillins Hives and Swelling    Medications:  Prior to Admission:  Prescriptions prior to admission  Medication Sig Dispense Refill  . acetaminophen (TYLENOL) 325 MG tablet Take 1,300 mg by mouth 2 (two) times daily.        . calcium-vitamin D (OSCAL WITH D) 500-200 MG-UNIT per tablet Take 1 tablet by mouth daily.        . multivitamin (PROSIGHT) TABS Take 1 tablet by mouth daily.        Marland Kitchen omeprazole (PRILOSEC) 20 MG capsule Take 20 mg by mouth daily.        . predniSONE (STERAPRED  UNI-PAK) 5 MG TABS Take 7.5 mg by mouth daily.         Scheduled:   . calcium-vitamin D  1 tablet Oral Daily  . multivitamin  1 tablet Oral Daily  . pantoprazole  40 mg Oral Q1200  . predniSONE  7.5 mg Oral Q breakfast   Continuous:   . sodium chloride 100 mL/hr at 04/18/11 0020    Results for orders placed during the hospital encounter of 04/14/11 (from the past 48 hour(s))  CBC     Status: Abnormal   Collection Time   04/16/11  5:36 PM      Component Value Range Comment   WBC 8.2  4.0 - 10.5 (K/uL)    RBC 4.07 (*) 4.22 - 5.81 (MIL/uL)    Hemoglobin 9.9 (*) 13.0 - 17.0 (g/dL)    HCT 16.1 (*)  09.6 - 52.0 (%)    MCV 78.4  78.0 - 100.0 (fL)    MCH 24.3 (*) 26.0 - 34.0 (pg)    MCHC 31.0  30.0 - 36.0 (g/dL)    RDW 04.5 (*) 40.9 - 15.5 (%)    Platelets 208  150 - 400 (K/uL)   CBC     Status: Abnormal   Collection Time   04/17/11  2:01 AM      Component Value Range Comment   WBC 8.0  4.0 - 10.5 (K/uL)    RBC 3.67 (*) 4.22 - 5.81 (MIL/uL)    Hemoglobin 8.9 (*) 13.0 - 17.0 (g/dL)    HCT 81.1 (*) 91.4 - 52.0 (%)    MCV 78.2  78.0 - 100.0 (fL)    MCH 24.3 (*) 26.0 - 34.0 (pg)    MCHC 31.0  30.0 - 36.0 (g/dL)    RDW 78.2 (*) 95.6 - 15.5 (%)    Platelets 196  150 - 400 (K/uL)   CBC     Status: Abnormal   Collection Time   04/17/11 10:45 AM      Component Value Range Comment   WBC 7.7  4.0 - 10.5 (K/uL)    RBC 3.22 (*) 4.22 - 5.81 (MIL/uL)    Hemoglobin 7.8 (*) 13.0 - 17.0 (g/dL)    HCT 21.3 (*) 08.6 - 52.0 (%)    MCV 78.6  78.0 - 100.0 (fL)    MCH 24.2 (*) 26.0 - 34.0 (pg)    MCHC 30.8  30.0 - 36.0 (g/dL)    RDW 57.8 (*) 46.9 - 15.5 (%)    Platelets 207  150 - 400 (K/uL)   PREPARE RBC (CROSSMATCH)     Status: Normal   Collection Time   04/17/11 12:30 PM      Component Value Range Comment   Order Confirmation ORDER PROCESSED BY BLOOD BANK     CBC     Status: Abnormal   Collection Time   04/18/11  1:15 AM      Component Value Range Comment   WBC 8.6  4.0 - 10.5 (K/uL)    RBC 3.30 (*) 4.22 - 5.81 (MIL/uL)    Hemoglobin 8.4 (*) 13.0 - 17.0 (g/dL)    HCT 62.9 (*) 52.8 - 52.0 (%)    MCV 79.7  78.0 - 100.0 (fL)    MCH 25.5 (*) 26.0 - 34.0 (pg)    MCHC 31.9  30.0 - 36.0 (g/dL)    RDW 41.3 (*) 24.4 - 15.5 (%)    Platelets 187  150 - 400 (K/uL)   CBC     Status:  Abnormal   Collection Time   04/18/11  9:18 AM      Component Value Range Comment   WBC 8.3  4.0 - 10.5 (K/uL)    RBC 3.28 (*) 4.22 - 5.81 (MIL/uL)    Hemoglobin 8.4 (*) 13.0 - 17.0 (g/dL)    HCT 16.1 (*) 09.6 - 52.0 (%)    MCV 79.6  78.0 - 100.0 (fL)    MCH 25.6 (*) 26.0 - 34.0 (pg)    MCHC 32.2  30.0 - 36.0  (g/dL)    RDW 04.5 (*) 40.9 - 15.5 (%)    Platelets 203  150 - 400 (K/uL)   BASIC METABOLIC PANEL     Status: Abnormal   Collection Time   04/18/11  9:18 AM      Component Value Range Comment   Sodium 140  135 - 145 (mEq/L)    Potassium 3.3 (*) 3.5 - 5.1 (mEq/L)    Chloride 110  96 - 112 (mEq/L)    CO2 22  19 - 32 (mEq/L)    Glucose, Bld 106 (*) 70 - 99 (mg/dL)    BUN 11  6 - 23 (mg/dL)    Creatinine, Ser 8.11  0.50 - 1.35 (mg/dL)    Calcium 8.3 (*) 8.4 - 10.5 (mg/dL)    GFR calc non Af Amer 83 (*) >90 (mL/min)    GFR calc Af Amer >90  >90 (mL/min)     Nm Gi Blood Loss  04/17/2011  *RADIOLOGY REPORT*  Clinical Data: Lower GI bleed.  NUCLEAR MEDICINE GASTROINTESTINAL BLEEDING STUDY  Technique:  Sequential abdominal images were obtained following intravenous administration of Tc-36m labeled red blood cells.  Radiopharmaceutical: 27 mCi technetium tagged to RBCs.  Comparison: CT scan dated 06/14/2006  Findings: There is evidence of active GI bleeding in the region of the hepatic flexure of the colon at 105 minutes.  No other abnormalities.  IMPRESSION: Active GI bleeding detected in the right upper quadrant, probably in the hepatic flexure of the colon. 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  Critical Value/emergent results were called by telephone at the time of interpretation on 04/17/2011  at 8:56 p.m.  to  Lurena Joiner, RN, who verbally acknowledged these results.  Original Report Authenticated By: Gwynn Burly, M.D.    Review of Systems  Constitutional: Negative.   HENT: Negative.   Eyes: Negative.   Respiratory: Negative.   Cardiovascular: Negative.        He's noted some orthostatic dizziness getting up to go to BR last 24 hours.  None prior to admission.  Gastrointestinal: Positive for heartburn (occasional), diarrhea (Multiple bloody stools since 04/14/11) and blood in stool. Negative for nausea, vomiting, abdominal pain and constipation.    Genitourinary: Negative.   Musculoskeletal: Positive for back pain and joint pain (hips). Negative for falls.       He has chronic hip and back pain  Skin: Negative.   Neurological: Negative.   Endo/Heme/Allergies: Negative.   Psychiatric/Behavioral: Negative.    Blood pressure 94/63, pulse 80, temperature 97.7 F (36.5 C), temperature source Oral, resp. rate 18, height 5\' 6"  (1.676 m), weight 80.377 kg (177 lb 3.2 oz), SpO2 96.00%. Physical Exam  Constitutional: He is oriented to person, place, and time. He appears well-developed and well-nourished. No distress.  HENT:  Head: Normocephalic and atraumatic.  Nose: Nose normal.  Eyes: Conjunctivae and EOM are normal. Pupils are equal, round, and reactive to light.  Neck: Normal range of motion.  Neck supple. No JVD present. No tracheal deviation present. No thyromegaly present.  Cardiovascular: Normal rate, regular rhythm, normal heart sounds and intact distal pulses.  Exam reveals no gallop and no friction rub.   No murmur heard. Respiratory: Effort normal and breath sounds normal. No respiratory distress. He has no wheezes. He has no rales. He exhibits no tenderness.  GI: He exhibits no distension and no mass. There is no tenderness. There is no rebound and no guarding.       Mid line abdominal scar and lateral abd scars, Inguinal hernia scars  Musculoskeletal: He exhibits no edema and no tenderness.       Bilateral knee scars  Lymphadenopathy:    He has no cervical adenopathy.  Neurological: He is alert and oriented to person, place, and time. He has normal reflexes. No cranial nerve deficit.  Skin: Skin is warm and dry. He is not diaphoretic.  Psychiatric: He has a normal mood and affect. His behavior is normal. Judgment and thought content normal.    Assessment/Plan: 1. Recurrent GI bleed in the area of the right hepatic flexure of the colon. 2. Prior bleed April 2012 with coil embolization distal right colic branch of the  SMA. 3. History of Open Niesen fundoplication with a  History GI bleed/ esophageal stricture. 4. History of prior small bowel obstruction, with lysis of adhesions and small bowel resection 2008. 5. History of ventral hernia repair with mesh, 2009. 6. 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 7. Chronic hip pain with bursitis. 8. Rheumatoid arthritis, severe. On steroids and immunosuppression for the last 27 years. 9. History of nephrolithiasis with prior lithotripsy. 10. BPH Plan: We will review this with Dr. Dwain Sarna, and he will discussed with hospitalist Dr. Dulce Sellar and interventional radiology.  [name] 04/18/2011, 12:52 PM

## 2011-04-18 NOTE — Consult Note (Signed)
He appears to have recurrence of a right colonic diverticular bleed.  This underwent angioembo last year and was effective.  He is currently bleeding and has received 2 U of blood.  His abdomen is soft, nontender.  He has a significant history of Nissen, SBO requiring laparotomy and SBR, and LVH with mesh.  We discussed options including observation (which I told him is not reasonable), radiologic intervention and surgery.  We discussed risks and benefits of all of these.  Specifically failure of IR to stop bleeding, localize or cause ischemia leading to emergent operation.  We also discussed operative risks. He would like to try and I agree with proceeding to IR first.  If this fails or there is complication, surgery will be the option.  I think even if this resolves he will need a conversation about elective surgery given two episodes.  He can do this with Dr. Abbey Chatters as he has operated on him before.

## 2011-04-19 LAB — CBC
HCT: 24.1 % — ABNORMAL LOW (ref 39.0–52.0)
HCT: 23.5 % — ABNORMAL LOW (ref 39.0–52.0)
Hemoglobin: 7.6 g/dL — ABNORMAL LOW (ref 13.0–17.0)
MCH: 25.8 pg — ABNORMAL LOW (ref 26.0–34.0)
MCH: 25.9 pg — ABNORMAL LOW (ref 26.0–34.0)
MCHC: 32.3 g/dL (ref 30.0–36.0)
MCV: 80.9 fL (ref 78.0–100.0)
Platelets: 191 10*3/uL (ref 150–400)
RBC: 2.98 MIL/uL — ABNORMAL LOW (ref 4.22–5.81)
RDW: 17.4 % — ABNORMAL HIGH (ref 11.5–15.5)
WBC: 7 10*3/uL (ref 4.0–10.5)

## 2011-04-19 NOTE — Progress Notes (Signed)
Pt had out-dated peripheral iv; new iv started on the 2nd attempt; pt somewhat upset because he's "been stuck over and over, every day;"   Would like to suggest a picc line for this pt if he will need longer term iv access; please advise;  Thank you!

## 2011-04-19 NOTE — Progress Notes (Signed)
  Subjective: Pt ok. No c/o Reports plenty of flatus, denies BPR. Denies pain.  Objective: Vital signs in last 24 hours: Temp:  [97.2 F (36.2 C)-98.5 F (36.9 C)] 97.9 F (36.6 C) (01/01 2130) Pulse Rate:  [65-82] 70  (01/01 2130) Resp:  [10-24] 16  (01/01 2130) BP: (94-163)/(57-89) 148/81 mmHg (01/01 2130) SpO2:  [93 %-100 %] 94 % (01/01 2130) Last BM Date: 04/18/11  Intake/Output this shift:    Physical Exam: BP 148/81  Pulse 70  Temp(Src) 97.9 F (36.6 C) (Oral)  Resp 16  Ht 5\' 6"  (1.676 m)  Wt 80.377 kg (177 lb 3.2 oz)  BMI 28.60 kg/m2  SpO2 94% Abdomen: soft, benign.  Labs: CBC  Basename 04/18/11 1733 04/18/11 0918  WBC 8.1 8.3  HGB 7.9* 8.4*  HCT 24.1* 26.1*  PLT 181 203   BMET  Basename 04/18/11 0918  NA 140  K 3.3*  CL 110  CO2 22  GLUCOSE 106*  BUN 11  CREATININE 0.93  CALCIUM 8.3*   LFT No results found for this basename: PROT,ALBUMIN,AST,ALT,ALKPHOS,BILITOT,BILIDIR,IBILI,LIPASE in the last 72 hours PT/INR  Basename 04/18/11 1331  LABPROT 16.1*  INR 1.26   ABG No results found for this basename: PHART:2,PCO2:2,PO2:2,HCO3:2 in the last 72 hours  Studies/Results: Nm Gi Blood Loss  04/17/2011  *RADIOLOGY REPORT*  Clinical Data: Lower GI bleed.  NUCLEAR MEDICINE GASTROINTESTINAL BLEEDING STUDY  Technique:  Sequential abdominal images were obtained following intravenous administration of Tc-79m labeled red blood cells.  Radiopharmaceutical: 27 mCi technetium tagged to RBCs.  Comparison: CT scan dated 06/14/2006  Findings: There is evidence of active GI bleeding in the region of the hepatic flexure of the colon at 105 minutes.  No other abnormalities.  IMPRESSION: Active GI bleeding detected in the right upper quadrant, probably in the hepatic flexure of the colon. 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  Critical Value/emergent results were called by telephone at the time of interpretation on 04/17/2011  at  8:56 p.m.  to  Lurena Joiner, RN, who verbally acknowledged these results.  Original Report Authenticated By: Gwynn Burly, M.D.    Assessment: Active Problems:  Lower GI bleed  Rheumatoid arthritis  Long-term current use of steroids  Iron deficiency anemia     Plan: Negative angio last night. Await labs today. ?need for colonoscopy while here? Will d/w MD and treating team.  LOS: 5 days    Marianna Fuss 04/19/2011

## 2011-04-19 NOTE — Progress Notes (Signed)
Subjective: No bleeding over past 24 hours. Angiogram yesterday afternoon showed no bleeding site.  Objective: Vital signs in last 24 hours: Temp:  [97.9 F (36.6 C)-98.5 F (36.9 C)] 97.9 F (36.6 C) (01/01 2130) Pulse Rate:  [65-82] 70  (01/01 2130) Resp:  [10-24] 16  (01/01 2130) BP: (115-163)/(57-89) 148/81 mmHg (01/01 2130) SpO2:  [93 %-100 %] 94 % (01/01 2130) Weight change:  Last BM Date: 04/18/11  PE: GEN:  NAD, non-toxic appearing. ABD:  Soft, non-tender  Lab Results: CBC    Component Value Date/Time   WBC 7.0 04/19/2011 0940   WBC 6.7 10/28/2010 1056   RBC 2.98* 04/19/2011 0940   RBC 4.96 10/28/2010 1056   HGB 7.7* 04/19/2011 0940   HGB 11.7* 10/28/2010 1056   HCT 24.1* 04/19/2011 0940   HCT 38.5 10/28/2010 1056   PLT 191 04/19/2011 0940   PLT 211 10/28/2010 1056   MCV 80.9 04/19/2011 0940   MCV 77.6* 10/28/2010 1056   MCH 25.8* 04/19/2011 0940   MCH 23.6* 10/28/2010 1056   MCHC 32.0 04/19/2011 0940   MCHC 30.4* 10/28/2010 1056   RDW 17.1* 04/19/2011 0940   RDW 15.8* 10/28/2010 1056   LYMPHSABS 1.5 10/28/2010 1056   LYMPHSABS 2.8 08/02/2010 1635   MONOABS 0.5 10/28/2010 1056   MONOABS 1.1* 08/02/2010 1635   EOSABS 0.1 10/28/2010 1056   EOSABS 0.1 08/02/2010 1635   BASOSABS 0.0 10/28/2010 1056   BASOSABS 0.0 08/02/2010 1635   Assessment:  1.  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  No bleeding in 24 hours. 2.  Acute blood loss anemia.  Improving post-transfusion.  Plan:  1.  Advance diet. 2.  I don't see any utility in repeat colonoscopy, as we have previously endoscopically located source of bleeding (but weren't able to treat it) within the past 8 months.  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 3.  At this point, I agree with supportive management.   However, I would give strong consideration to outpatient follow-up with Dr. Abbey Chatters (patient's primary surgeon) for discussion of risk/benefits of elective right hemicolectomy or, at the very least, potential surgical decision-making in the event he should have similar rebleeding in the future. 4.  Thank you for the consultation.  We will follow with you.   Freddy Jaksch 04/19/2011, 2:29 PM

## 2011-04-19 NOTE — Progress Notes (Signed)
Subjective: Patient seen and examined this am. denies any rectal bleed today.   Objective:  Vital signs in last 24 hours:  Filed Vitals:   04/18/11 2030 04/18/11 2130 04/19/11 1035 04/19/11 1400  BP: 115/61 148/81 140/84 135/79  Pulse: 69 70 84 76  Temp: 98.5 F (36.9 C) 97.9 F (36.6 C) 97.6 F (36.4 C) 98.2 F (36.8 C)  TempSrc: Oral Oral Oral Oral  Resp: 16 16 18 18   Height:      Weight:      SpO2: 93% 94% 94% 96%    Intake/Output from previous day:   Intake/Output Summary (Last 24 hours) at 04/19/11 1853 Last data filed at 04/19/11 1500  Gross per 24 hour  Intake    240 ml  Output    905 ml  Net   -665 ml    Physical Exam:  General: elderly male  in no acute distress. HEENT: no pallor, no icterus, moist oral mucosa, no JVD, no lymphadenopathy Heart: Normal  s1 &s2  Regular rate and rhythm, without murmurs, rubs, gallops. Lungs: Clear to auscultation bilaterally. Abdomen: Soft, nontender, nondistended, positive bowel sounds. Extremities: No clubbing cyanosis or edema with positive pedal pulses. Neuro: Alert, awake, oriented x3, nonfocal.   Lab Results:  Basic Metabolic Panel:    Component Value Date/Time   NA 140 04/18/2011 0918   K 3.3* 04/18/2011 0918   CL 110 04/18/2011 0918   CO2 22 04/18/2011 0918   BUN 11 04/18/2011 0918   CREATININE 0.93 04/18/2011 0918   GLUCOSE 106* 04/18/2011 0918   CALCIUM 8.3* 04/18/2011 0918   CBC:    Component Value Date/Time   WBC 8.3 04/19/2011 1726   WBC 6.7 10/28/2010 1056   HGB 7.6* 04/19/2011 1726   HGB 11.7* 10/28/2010 1056   HCT 23.5* 04/19/2011 1726   HCT 38.5 10/28/2010 1056   PLT 204 04/19/2011 1726   PLT 211 10/28/2010 1056   MCV 80.2 04/19/2011 1726   MCV 77.6* 10/28/2010 1056   NEUTROABS 4.6 10/28/2010 1056   NEUTROABS 6.9 08/02/2010 1635   LYMPHSABS 1.5 10/28/2010 1056   LYMPHSABS 2.8 08/02/2010 1635   MONOABS 0.5 10/28/2010 1056   MONOABS 1.1* 08/02/2010 1635   EOSABS 0.1 10/28/2010 1056   EOSABS 0.1 08/02/2010 1635   BASOSABS  0.0 10/28/2010 1056   BASOSABS 0.0 08/02/2010 1635    No results found for this or any previous visit (from the past 240 hour(s)).  Studies/Results: Nm Gi Blood Loss  04/17/2011  *RADIOLOGY REPORT*  Clinical Data: Lower GI bleed.  NUCLEAR MEDICINE GASTROINTESTINAL BLEEDING STUDY  Technique:  Sequential abdominal images were obtained following intravenous administration of Tc-34m labeled red blood cells.  Radiopharmaceutical: 27 mCi technetium tagged to RBCs.  Comparison: CT scan dated 06/14/2006  Findings: There is evidence of active GI bleeding in the region of the hepatic flexure of the colon at 105 minutes.  No other abnormalities.  IMPRESSION: Active GI bleeding detected in the right upper quadrant, probably in the hepatic flexure of the colon. 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  Critical Value/emergent results were called by telephone at the time of interpretation on 04/17/2011  at 8:56 p.m.  to  Lurena Joiner, RN, who verbally acknowledged these results.  Original Report Authenticated By: Gwynn Burly, M.D.   Ir Angiogram Visceral Selective  04/19/2011  *RADIOLOGY REPORT*  Clinical Data: Recurrent lower GI bleed.  Status post prior transcatheter coil embolization of a distal right colic branch on  08/04/2010 to treat bleeding at the level of the hepatic flexure of the colon.  Bleeding scan yesterday has demonstrated positive bleeding near the expected position of the hepatic flexure.  1.  ULTRASOUND GUIDANCE FOR VASCULAR ACCESS OF THE RIGHT COMMON FEMORAL ARTERY 2.  SELECTIVE ARTERIOGRAPHY OF THE SUPERIOR MESENTERIC ARTERY  Comparison:  08/04/2010  Sedation: 0.5 mg IV Versed; 25 mcg IV Fentanyl.  Total Moderate Sedation Time: 30 minutes.  Contrast:  90 ml Omnipaque-300  Fluoroscopy Time: 7.7 minutes.  Procedure:  The procedure, risks, benefits, and alternatives were explained to the patient.  Questions regarding the procedure were encouraged and answered.  The  patient understands and consents to the procedure.  The right groin was prepped with Betadine in a sterile fashion, and a sterile drape was applied covering the operative field.  A sterile gown and sterile gloves were used for the procedure. Local anesthesia was provided with 1% Lidocaine.  The right common femoral artery was accessed utilizing a micropuncture set and direct ultrasound guidance.  Ultrasound image documentation was performed.  After securing guide wire access, a 5- French sheath was placed.  A 5-French Cobra catheter was then advanced and used to selectively catheterize the trunk of the superior mesenteric artery.  Selective arteriography was performed in two different projections.  Attempt was also made to catheterize the inferior mesenteric artery with the Cobra catheter as well as a Sos catheter.  After the procedure, sheath and catheter were removed and hemostasis obtained with manual compression and use of a V-pad.  Complications: None  Findings: Superior mesenteric arteriography demonstrates no evidence of active contrast extravasation or vascular abnormality. Branch vessels are well delineated.  Focal embolization coils are again noted near the hepatic flexure in stable position.  No evidence of active bleeding at the site of prior embolization or at the hepatic flexure of the colon.  In order to assess the rest of the colon, attempt was made to catheterize the inferior mesenteric artery which did appear open by prior CT in 2008.  The artery was not able to be selectively catheterized utilizing different catheters.  IMPRESSION: Normal selective arteriography of the superior mesenteric artery demonstrating no evidence of active GI bleed.  Region of prior coil embolization at the hepatic flexure shows no angiographic abnormalities.  Embolization was unable to be performed.  The inferior mesenteric artery was attempted to be studied but could not be selectively catheterized.  Original Report  Authenticated By: Reola Calkins, M.D.   Ir US Guide Vasc Access Right  04/19/2011  *RADIOLOGY REPORT*  Clinical Data: Recurrent lower GI bleed.  Status post prior transcatheter coil embolization of a distal right colic branch on 08/04/2010 to treat bleeding at the level of the hepatic flexure of the colon.  Bleeding scan yesterday has demonstrated positive bleeding near the expected position of the hepatic flexure.  1.  ULTRASOUND GUIDANCE FOR VASCULAR ACCESS OF THE RIGHT COMMON FEMORAL ARTERY 2.  SELECTIVE ARTERIOGRAPHY OF THE SUPERIOR MESENTERIC ARTERY  Comparison:  08/04/2010  Sedation: 0.5 mg IV Versed; 25 mcg IV Fentanyl.  Total Moderate Sedation Time: 30 minutes.  Contrast:  90 ml Omnipaque-300  Fluoroscopy Time: 7.7 minutes.  Procedure:  The procedure, risks, benefits, and alternatives were explained to the patient.  Questions regarding the procedure were encouraged and answered.  The patient understands and consents to the procedure.  The right groin was prepped with Betadine in a sterile fashion, and a sterile drape was applied covering the operative field.  A  sterile gown and sterile gloves were used for the procedure. Local anesthesia was provided with 1% Lidocaine.  The right common femoral artery was accessed utilizing a micropuncture set and direct ultrasound guidance.  Ultrasound image documentation was performed.  After securing guide wire access, a 5- French sheath was placed.  A 5-French Cobra catheter was then advanced and used to selectively catheterize the trunk of the superior mesenteric artery.  Selective arteriography was performed in two different projections.  Attempt was also made to catheterize the inferior mesenteric artery with the Cobra catheter as well as a Sos catheter.  After the procedure, sheath and catheter were removed and hemostasis obtained with manual compression and use of a V-pad.  Complications: None  Findings: Superior mesenteric arteriography demonstrates no  evidence of active contrast extravasation or vascular abnormality. Branch vessels are well delineated.  Focal embolization coils are again noted near the hepatic flexure in stable position.  No evidence of active bleeding at the site of prior embolization or at the hepatic flexure of the colon.  In order to assess the rest of the colon, attempt was made to catheterize the inferior mesenteric artery which did appear open by prior CT in 2008.  The artery was not able to be selectively catheterized utilizing different catheters.  IMPRESSION: Normal selective arteriography of the superior mesenteric artery demonstrating no evidence of active GI bleed.  Region of prior coil embolization at the hepatic flexure shows no angiographic abnormalities.  Embolization was unable to be performed.  The inferior mesenteric artery was attempted to be studied but could not be selectively catheterized.  Original Report Authenticated By: Reola Calkins, M.D.    Medications: Scheduled Meds:   . calcium-vitamin D  1 tablet Oral Daily  . multivitamin  1 tablet Oral Daily  . pantoprazole  40 mg Oral Q1200  . predniSONE  7.5 mg Oral Q breakfast   Continuous Infusions:   . sodium chloride 100 mL/hr at 04/19/11 1316   PRN Meds:.acetaminophen, acetaminophen, traZODone  Assessment/Plan: 71 y/o male with hx of RA on steroids, hx of lower Gi bleed s/p embolization in April 2012presented with lower GI bleed  1. GI bleed:  Likely lower GI bleed in origin, patient did have significant lower GI bleed in the past required embolization.  When patient restarted  her diet had multiple bowel movements it has fresh blood and blood clots. Patient's  hemoglobin dropped from 9.7 to 7.8. Cont PPI Seen by GI and surgery. Had SMA angiogram done on 1/1 and unable to find source of active Gi bleed. No recurrent bleeding seen over hepatic flexure which was previously embolized. This still could be the source of bleeding as per GI as both  colonoscopy and tagged RBC scan in past have shown the hepatic flexure to be the source of bleed. Feels the there is no utility on re scoping and recommend monitoring for now and patient to follow up with his surgeon on evaluation for possible rt hemicolectomy as outpt.  2. Rheumatoid arthritis: Erosive arthritis on long-term steroids . Cont for now  3. Anemia: Chronic iron deficiency anemia exacerbated by acute blood loss. Had 1 g of iron infused. Over the past 24 hours hemoglobin dropped to 7.8 from 9.7. Given 2 u PRBC . H&h now stable. monitor in am       LOS: 5 days   Kennith Morss 04/19/2011, 6:53 PM

## 2011-04-20 LAB — CBC
MCH: 26.1 pg (ref 26.0–34.0)
MCHC: 31.7 g/dL (ref 30.0–36.0)
MCV: 82.5 fL (ref 78.0–100.0)
Platelets: 241 10*3/uL (ref 150–400)
RBC: 3.14 MIL/uL — ABNORMAL LOW (ref 4.22–5.81)
RDW: 18.6 % — ABNORMAL HIGH (ref 11.5–15.5)

## 2011-04-20 NOTE — Discharge Summary (Signed)
Patient ID: [name] MRN: [id number] DOB/AGE: Sep 14, 1940 71 y.o.  Admit date: 04/14/2011 Discharge date: 04/20/2011  Primary Care Physician:  Ginette Otto, MD, MD  Discharge Diagnoses:    Present on Admission:  . Acute Lower GI bleed .Rheumatoid arthritis .Iron deficiency anemia    Current Discharge Medication List    CONTINUE these medications which have NOT CHANGED   Details  acetaminophen (TYLENOL) 325 MG tablet Take 1,300 mg by mouth 2 (two) times daily.      calcium-vitamin D (OSCAL WITH D) 500-200 MG-UNIT per tablet Take 1 tablet by mouth daily.      multivitamin (PROSIGHT) TABS Take 1 tablet by mouth daily.      omeprazole (PRILOSEC) 20 MG capsule Take 20 mg by mouth daily.      predniSONE (STERAPRED UNI-PAK) 5 MG TABS Take 7.5 mg by mouth daily.          Disposition and Follow-up:  Follow up with PCP in 1 week  follow up with Dr Abbey Chatters ( surgery) within 1 week  Consults:   GI Madilyn Fireman) Dwain Sarna( surgery)  Significant Diagnostic Studies:  04/17/2011 *RADIOLOGY REPORT* Clinical Data: Lower GI bleed. NUCLEAR MEDICINE GASTROINTESTINAL BLEEDING STUDY Technique: Sequential abdominal images were obtained following intravenous administration of Tc-40m labeled red blood cells. Radiopharmaceutical: 27 mCi technetium tagged to RBCs. Comparison: CT scan dated 06/14/2006 Findings: There is evidence of active GI bleeding in the region of the hepatic flexure of the colon at 105 minutes. No other abnormalities. IMPRESSION: Active GI bleeding detected in the right upper quadrant, probably in the hepatic flexure of the colon. 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 Critical Value/emergent results were called by telephone at the time of interpretation on 04/17/2011 at 8:56 p.m. to Lurena Joiner, RN, who verbally acknowledged these results. Original Report Authenticated By: Gwynn Burly, M.D.   Ir Angiogram Visceral Selective    04/19/2011 *RADIOLOGY REPORT* Clinical Data: Recurrent lower GI bleed. Status post prior transcatheter coil embolization of a distal right colic branch on 08/04/2010 to treat bleeding at the level of the hepatic flexure of the colon. Bleeding scan yesterday has demonstrated positive bleeding near the expected position of the hepatic flexure. 1. ULTRASOUND GUIDANCE FOR VASCULAR ACCESS OF THE RIGHT COMMON FEMORAL ARTERY 2. SELECTIVE ARTERIOGRAPHY OF THE SUPERIOR MESENTERIC ARTERY Comparison: 08/04/2010 Sedation: 0.5 mg IV Versed; 25 mcg IV Fentanyl. Total Moderate Sedation Time: 30 minutes. Contrast: 90 ml Omnipaque-300 Fluoroscopy Time: 7.7 minutes. Procedure: The procedure, risks, benefits, and alternatives were explained to the patient. Questions regarding the procedure were encouraged and answered. The patient understands and consents to the procedure. The right groin was prepped with Betadine in a sterile fashion, and a sterile drape was applied covering the operative field. A sterile gown and sterile gloves were used for the procedure. Local anesthesia was provided with 1% Lidocaine. The right common femoral artery was accessed utilizing a micropuncture set and direct ultrasound guidance. Ultrasound image documentation was performed. After securing guide wire access, a 5- French sheath was placed. A 5-French Cobra catheter was then advanced and used to selectively catheterize the trunk of the superior mesenteric artery. Selective arteriography was performed in two different projections. Attempt was also made to catheterize the inferior mesenteric artery with the Cobra catheter as well as a Sos catheter. After the procedure, sheath and catheter were removed and hemostasis obtained with manual compression and use of a V-pad. Complications: None Findings: Superior mesenteric arteriography demonstrates no evidence of active  contrast extravasation or vascular abnormality. Branch vessels are well delineated. Focal  embolization coils are again noted near the hepatic flexure in stable position. No evidence of active bleeding at the site of prior embolization or at the hepatic flexure of the colon. In order to assess the rest of the colon, attempt was made to catheterize the inferior mesenteric artery which did appear open by prior CT in 2008. The artery was not able to be selectively catheterized utilizing different catheters. IMPRESSION: Normal selective arteriography of the superior mesenteric artery demonstrating no evidence of active GI bleed. Region of prior coil embolization at the hepatic flexure shows no angiographic abnormalities. Embolization was unable to be performed. The inferior mesenteric artery was attempted to be studied but could not be selectively catheterized. Original Report Authenticated By: Reola Calkins, M.D.    Ir US Guide Vasc Access Right  04/19/2011 *RADIOLOGY REPORT* Clinical Data: Recurrent lower GI bleed. Status post prior transcatheter coil embolization of a distal right colic branch on 08/04/2010 to treat bleeding at the level of the hepatic flexure of the colon. Bleeding scan yesterday has demonstrated positive bleeding near the expected position of the hepatic flexure. 1. ULTRASOUND GUIDANCE FOR VASCULAR ACCESS OF THE RIGHT COMMON FEMORAL ARTERY 2. SELECTIVE ARTERIOGRAPHY OF THE SUPERIOR MESENTERIC ARTERY Comparison: 08/04/2010 Sedation: 0.5 mg IV Versed; 25 mcg IV Fentanyl. Total Moderate Sedation Time: 30 minutes. Contrast: 90 ml Omnipaque-300 Fluoroscopy Time: 7.7 minutes. Procedure: The procedure, risks, benefits, and alternatives were explained to the patient. Questions regarding the procedure were encouraged and answered. The patient understands and consents to the procedure. The right groin was prepped with Betadine in a sterile fashion, and a sterile drape was applied covering the operative field. A sterile gown and sterile gloves were used for the procedure. Local anesthesia was  provided with 1% Lidocaine. The right common femoral artery was accessed utilizing a micropuncture set and direct ultrasound guidance. Ultrasound image documentation was performed. After securing guide wire access, a 5- French sheath was placed. A 5-French Cobra catheter was then advanced and used to selectively catheterize the trunk of the superior mesenteric artery. Selective arteriography was performed in two different projections. Attempt was also made to catheterize the inferior mesenteric artery with the Cobra catheter as well as a Sos catheter. After the procedure, sheath and catheter were removed and hemostasis obtained with manual compression and use of a V-pad. Complications: None Findings: Superior mesenteric arteriography demonstrates no evidence of active contrast extravasation or vascular abnormality. Branch vessels are well delineated. Focal embolization coils are again noted near the hepatic flexure in stable position. No evidence of active bleeding at the site of prior embolization or at the hepatic flexure of the colon. In order to assess the rest of the colon, attempt was made to catheterize the inferior mesenteric artery which did appear open by prior CT in 2008. The artery was not able to be selectively catheterized utilizing different catheters. IMPRESSION: Normal selective arteriography of the superior mesenteric artery demonstrating no evidence of active GI bleed. Region of prior coil embolization at the hepatic flexure shows no angiographic abnormalities. Embolization was unable to be performed. The inferior mesenteric artery was attempted to be studied but could not be selectively catheterized. Original Report Authenticated By: Reola Calkins, M.D.   Medications:    Brief H and P: For complete details please refer to admission H and P, but in brief 71 y.o. male with past medical history of rheumatoid arthritis on chronic steroids and a history  of significant lower GI bleed in  April 2012 treated with embolization. At that time required transfusion of 6 units of packed RBCs. Patient was in his usual state of health all about 2:00 in the morning. He felt the urge to go to the bathroom and he had bowel movement, since then he had 4 bloody bowel movements. Patient denies any nausea, vomiting, hematemesis, recent weight loss, abdominal pain or fever and chills. Patient did not have any lightheadedness or dizziness and because of his previous history of lower GI bleed he came into the emergency department for further evaluation.  Upon initial evaluation in the emergency department patient was found to have hemoglobin of 12.3, since last night he total of 5 bloody bowel movements. GI was notified patient will be admitted for further evaluation.    Physical Exam on Discharge:  Filed Vitals:   04/19/11 1035 04/19/11 1400 04/19/11 2147 04/20/11 0604  BP: 140/84 135/79 113/56 131/67  Pulse: 84 76 70 87  Temp: 97.6 F (36.4 C) 98.2 F (36.8 C) 98.2 F (36.8 C) 98.1 F (36.7 C)  TempSrc: Oral Oral Oral Oral  Resp: 18 18 18 18   Height:   5\' 6"  (1.676 m)   Weight:   79.198 kg (174 lb 9.6 oz)   SpO2: 94% 96% 95% 97%     Intake/Output Summary (Last 24 hours) at 04/20/11 1120 Last data filed at 04/20/11 0600  Gross per 24 hour  Intake    240 ml  Output    952 ml  Net   -712 ml    General: elderly male in no acute distress.  HEENT: no pallor, no icterus, moist oral mucosa, no JVD, no lymphadenopathy  Heart: Normal s1 &s2 Regular rate and rhythm, without murmurs, rubs, gallops.  Lungs: Clear to auscultation bilaterally.  Abdomen: Soft, nontender, nondistended, positive bowel sounds.  Extremities: No clubbing cyanosis or edema with positive pedal pulses.  Neuro: Alert, awake, oriented x3, nonfocal.   CBC:    Component Value Date/Time   WBC 9.9 04/20/2011 0845   WBC 6.7 10/28/2010 1056   HGB 8.2* 04/20/2011 0845   HGB 11.7* 10/28/2010 1056   HCT 25.9* 04/20/2011 0845    HCT 38.5 10/28/2010 1056   PLT 241 04/20/2011 0845   PLT 211 10/28/2010 1056   MCV 82.5 04/20/2011 0845   MCV 77.6* 10/28/2010 1056   NEUTROABS 4.6 10/28/2010 1056   NEUTROABS 6.9 08/02/2010 1635   LYMPHSABS 1.5 10/28/2010 1056   LYMPHSABS 2.8 08/02/2010 1635   MONOABS 0.5 10/28/2010 1056   MONOABS 1.1* 08/02/2010 1635   EOSABS 0.1 10/28/2010 1056   EOSABS 0.1 08/02/2010 1635   BASOSABS 0.0 10/28/2010 1056   BASOSABS 0.0 08/02/2010 1635    Basic Metabolic Panel:    Component Value Date/Time   NA 140 04/18/2011 0918   K 3.3* 04/18/2011 0918   CL 110 04/18/2011 0918   CO2 22 04/18/2011 0918   BUN 11 04/18/2011 0918   CREATININE 0.93 04/18/2011 0918   GLUCOSE 106* 04/18/2011 0918   CALCIUM 8.3* 04/18/2011 0918    Hospital Course:   1. Lower GI bleed:  Likely lower GI bleed in origin, patient did have significant lower GI bleed in the past requiring embolization.  When patient restarted his  diet he had multiple bowel movements it has fresh blood and blood clots. Patient's hemoglobin dropped from 9.7 to 7.8. Placed on PPI Seen by GI and surgery. Had SMA angiogram done on 1/1 and unable to find  source of active Gi bleed. No recurrent bleeding seen over hepatic flexure which was previously embolized. This still could be the source of bleeding as per GI as both colonoscopy and tagged RBC scan in past have shown the hepatic flexure to be the source of bleed. Feels the there is no utility on re scoping and recommend monitoring for now and patient to follow up with his surgeon on evaluation for possible rt hemicolectomy as outpt. Seen by surgery and recommends outpatient follow up soon  With Dr Abbey Chatters to discuss the possibility of rt hemicolectomy. He will make appt for follow up within 1 week. Patient also has Chronic iron deficiency anemia exacerbated by acute blood loss. Had 1 g of iron infused. Over the past 24 hours since admission hemoglobin dropped to 7.8 from 9.7. Given 2 u PRBC . Patient asymptomatic with H&h  now stable and hb prior to discharge is 8.2 with no further lower GI bleed in past 48 hrs  2. Rheumatoid arthritis: Erosive arthritis on long-term steroids . stable      Time spent on Discharge: 45 minutes  Signed: Masaichi Kracht 04/20/2011, 11:20 AM

## 2011-04-20 NOTE — Progress Notes (Signed)
I assessed patient's rt. Groin area at 0815 am.  Patient refuses any more assessments related to right groin area [name] 

## 2011-04-20 NOTE — Progress Notes (Signed)
   CARE MANAGEMENT NOTE 04/20/2011  Patient:  [name], [name]   Account Number:  [id number]  Date Initiated:  04/20/2011  Documentation initiated by:  Letha Cape  Subjective/Objective Assessment:   dx lower gib  admit- pta independent.     Action/Plan:   Anticipated DC Date:  04/20/2011   Anticipated DC Plan:  HOME/SELF CARE      DC Planning Services  CM consult      Choice offered to / List presented to:             Status of service:  Completed, signed off Medicare Important Message given?   (If response is "NO", the following Medicare IM given date fields will be blank) Date Medicare IM given:   Date Additional Medicare IM given:    Discharge Disposition:  HOME/SELF CARE  Per UR Regulation:    Comments:  04/20/11 132:59 Letha Cape RN, BSN [phone] Pta independent, no needs identified.  Patient dc to home.

## 2011-04-20 NOTE — Progress Notes (Signed)
  Subjective: Pt had BM this am, seemed more formed to him but documented as dark red, poss clot per RN. HE denies pain and wants to go home, he is eating reg diet well.  Objective: Vital signs in last 24 hours: Temp:  [97.6 F (36.4 C)-98.2 F (36.8 C)] 98.1 F (36.7 C) (01/03 0604) Pulse Rate:  [70-87] 87  (01/03 0604) Resp:  [18] 18  (01/03 0604) BP: (113-140)/(56-84) 131/67 mmHg (01/03 0604) SpO2:  [94 %-97 %] 97 % (01/03 0604) Weight:  [174 lb 9.6 oz (79.198 kg)] 174 lb 9.6 oz (79.198 kg) (01/02 2147) Last BM Date: 04/18/11  Intake/Output this shift:    Physical Exam: BP 131/67  Pulse 87  Temp(Src) 98.1 F (36.7 C) (Oral)  Resp 18  Ht 5\' 6"  (1.676 m)  Wt 174 lb 9.6 oz (79.198 kg)  BMI 28.18 kg/m2  SpO2 97% Abdomen: soft, NT  Labs: CBC  Basename 04/19/11 1726 04/19/11 0940  WBC 8.3 7.0  HGB 7.6* 7.7*  HCT 23.5* 24.1*  PLT 204 191   BMET  Basename 04/18/11 0918  NA 140  K 3.3*  CL 110  CO2 22  GLUCOSE 106*  BUN 11  CREATININE 0.93  CALCIUM 8.3*   LFT No results found for this basename: PROT,ALBUMIN,AST,ALT,ALKPHOS,BILITOT,BILIDIR,IBILI,LIPASE in the last 72 hours PT/INR  Basename 04/18/11 1331  LABPROT 16.1*  INR 1.26   ABG No results found for this basename: PHART:2,PCO2:2,PO2:2,HCO3:2 in the last 72 hours    Assessment: Active Problems:  Lower GI bleed  Rheumatoid arthritis  Long-term current use of steroids  Iron deficiency anemia     Plan: Hgb stable. Hemodynamically stable. Clot with formed stool this am. Prob ok for Dc, resume Iron supp. F/U Dr. Abbey Chatters soon to discuss elective resection.  LOS: 6 days    Marianna Fuss 04/20/2011

## 2011-04-20 NOTE — Progress Notes (Signed)
Pt stated he had BM. BM appeared dark red, small.  Appeared to be that the patient passed a clot.  Will continue to monitor BMs

## 2011-04-20 NOTE — Progress Notes (Signed)
I assessed patient's rt. Groin area at 0815 am.  Patient refuses any more assessments related to right groin area [name], Cira Rue

## 2011-05-01 ENCOUNTER — Encounter (INDEPENDENT_AMBULATORY_CARE_PROVIDER_SITE_OTHER): Payer: Medicare Other | Admitting: Cardiology

## 2011-05-01 ENCOUNTER — Other Ambulatory Visit: Payer: Self-pay | Admitting: Cardiology

## 2011-05-01 DIAGNOSIS — M7989 Other specified soft tissue disorders: Secondary | ICD-10-CM

## 2011-05-01 DIAGNOSIS — R609 Edema, unspecified: Secondary | ICD-10-CM

## 2011-05-19 ENCOUNTER — Encounter (INDEPENDENT_AMBULATORY_CARE_PROVIDER_SITE_OTHER): Payer: Self-pay | Admitting: General Surgery

## 2011-05-19 ENCOUNTER — Ambulatory Visit (INDEPENDENT_AMBULATORY_CARE_PROVIDER_SITE_OTHER): Payer: Medicare Other | Admitting: General Surgery

## 2011-05-19 VITALS — BP 128/84 | HR 68 | Temp 97.8°F | Resp 16 | Ht 66.5 in | Wt 182.6 lb

## 2011-05-19 DIAGNOSIS — Z09 Encounter for follow-up examination after completed treatment for conditions other than malignant neoplasm: Secondary | ICD-10-CM

## 2011-05-19 NOTE — Patient Instructions (Signed)
High fiber diet. Try to get off the prednisone.

## 2011-05-19 NOTE — Progress Notes (Signed)
[name] is here for followup after his recent hospitalization for lower GI bleeding from an area at the hepatic flexure the colon felt to be secondary to diverticular disease. He had a previous bleed last year that was treated with angioembolization end coiling in hepatic flexure region. He had a recent recurrent GI bleed in early January which resolved spontaneously. The bleeding scan demonstrated an area of increased activity in the right upper quadrant region consistent with the hepatic flexure.  He received 2 units of blood. He was told at that time that if he has any recurrent bleeding they would recommend partial colectomy. He has had no further bleeding since being at home. He had a colonoscopy done last year by Dr. Laural Benes.  Exam: [name] looks well and in no acute distress.  Abdomen-soft with a long midline scar and some laxity present; multiple small lateral scars are noted as well. No palpable masses.  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 He has had a number of intra-abdominal operations and is currently on steroids for his rheumatoid arthritis.  He is not very keen on any operative intervention at this time.  Plan: High fiber diet. Try to wean him off the prednisone as much as possible-this will be left up to his medical doctors.  If he has a third bleed and I think he should have operative intervention while he was in the hospital. I have discussed this with him.

## 2011-07-28 ENCOUNTER — Other Ambulatory Visit: Payer: Self-pay | Admitting: Geriatric Medicine

## 2011-07-28 DIAGNOSIS — E049 Nontoxic goiter, unspecified: Secondary | ICD-10-CM

## 2011-08-01 ENCOUNTER — Other Ambulatory Visit: Payer: Medicare Other

## 2011-08-03 ENCOUNTER — Ambulatory Visit
Admission: RE | Admit: 2011-08-03 | Discharge: 2011-08-03 | Disposition: A | Discharge status: Home / Self Care | Payer: Medicare Other | Source: Ambulatory Visit | Attending: Geriatric Medicine | Admitting: Geriatric Medicine

## 2011-08-03 DIAGNOSIS — E049 Nontoxic goiter, unspecified: Secondary | ICD-10-CM

## 2012-08-27 ENCOUNTER — Encounter (HOSPITAL_COMMUNITY): Payer: Self-pay | Admitting: *Deleted

## 2012-08-27 ENCOUNTER — Other Ambulatory Visit: Payer: Self-pay | Admitting: Urology

## 2012-08-28 ENCOUNTER — Encounter (HOSPITAL_COMMUNITY): Payer: Self-pay | Admitting: Pharmacy Technician

## 2012-08-28 NOTE — H&P (Signed)
ctive Problems Problems  1. Benign Prostatic Hypertrophy With Urinary Obstruction 600.01 2. Feelings Of Urinary Urgency 788.63 3. Malignant Prostatic Neoplasm T1c 4. Microscopic Hematuria 599.72 5. History of  Nephrolithiasis V13.01 6. 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 Prostate Cancer 185 9. PSA,Elevated 790.93 10. Urge Incontinence Of Urine 788.31 11. Urinary Tract Infection 599.0  History of Present Illness  [name] returns today in f/u.  He has a 6mm right mid ureteral stone.   I couldn't see it well on the CT scout so he is back today for a KUB.   The stone is readily seen on KUB just above the bottom of the sacral edge.  He is not having voiding symptoms but hasn't seen blood since last week.  The pain has improved.   Past Medical History Problems  1. History of  Arthritis V13.4 2. History of  Duodenal Ulcer 532.90 3. History of  Esophageal Reflux 530.81 4. History of  Nephrolithiasis V13.01 5. History of  Nocturia 788.43 6. History of  Rheumatoid Arthritis 714.0  Surgical History Problems  1. History of  Anterior Gastropexy For Hiatal Hernia 2. History of  Anterior Gastropexy For Hiatal Hernia 3. History of  Colon Surgery 4. History of  Cystoscopy With Ureteroscopy 5. History of  Incisional Hernia Repair With Implantation Of Mesh 6. History of  Inguinal Hernia Repair 7. History of  Intestinal Surgery 8. History of  Kidney Surgery 9. History of  Knee Replacement 10. History of  Lithotripsy  Current Meds 1. Alendronate Sodium 70 MG Oral Tablet; Therapy: 29Jan2013 to 2. ICaps MV TABS; Therapy: (Recorded:13Jan2014) to 3. Leflunomide 20 MG Oral Tablet; Therapy: 29Jan2013 to 4. Levitra 10 MG Oral Tablet; TAKE 1 TABLET As Directed PRN; Therapy: 13Jan2014 to (Last  Rx:13Jan2014)  Requested for: 13Jan2014 5. Multi-Vitamin TABS; Therapy: (Recorded:30Apr2009) to 6. Omeprazole 20 MG Oral Capsule Delayed Release; Therapy: 11Oct2012 to 7. PredniSONE 5 MG Oral  Tablet; Therapy: 29Jan2013 to 8. Tamsulosin HCl 0.4 MG Oral Capsule; TAKE 1 CAPSULE Daily with meals; Therapy: 18Nov2013  to (Evaluate:13Nov2014)  Requested for: 18Nov2013; Last Rx:18Nov2013 9. TraMADol HCl 50 MG Oral Tablet; Therapy: 21May2013 to 10. Tylenol Arthritis Pain TBCR; Therapy: (Recorded:13Jan2014) to 11. Vitamin D TABS; Therapy: (Recorded:18Nov2013) to  Allergies Medication  1. Clindamycin 2. Codeine Derivatives 3. Penicillins  Family History Problems  1. Family history of  Family Health Status Number Of Children 1 son1 daughter  Social History Problems  1. Caffeine Use 2. Marital History - Currently Married 3. 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 History of  Alcohol Use  Review of Systems  Cardiovascular: no chest pain.  Respiratory: no shortness of breath.    Vitals Vital Signs [Data Includes: Last 1 Day]  12May2014 03:49PM  Blood Pressure: 150 / 82 Temperature: 98.3 F Heart Rate: 60  Physical Exam Constitutional: Well nourished and well developed . No acute distress.  Pulmonary: No respiratory distress and normal respiratory rhythm and effort.  Cardiovascular: Heart rate and rhythm are normal . No peripheral edema.    Results/Data  The following images/tracing/specimen were independently visualized:  I reviewed his KUB from today and compared to his prior KUB's and his recent CT. He has a 6mm stone over lying the right lower sacrum and it was previously in the lumbar area in 11/13. He has a 5mm RLP stone. There are multiple hernia screws. The film is otherwise unremarkable. Selected Results  URINE CULTURE 09May2014 04:33PM Seward Grater  SOURCE : CLEAN CATCH  Test Name Result Flag Reference  CULTURE, URINE Culture, Urine    ===== COLONY COUNT: =====  9,000 COLONIES/ML   FINAL REPORT: INSIGNIFICANT GROWTH   UA With REFLEX 09May2014 03:29PM Seward Grater   Test Name Result Flag Reference  COLOR YELLOW   YELLOW  APPEARANCE CLEAR  CLEAR  SPECIFIC GRAVITY 1.030  1.005-1.030  pH 6.0  5.0-8.0  GLUCOSE NEG mg/dL  NEG  BILIRUBIN SMALL A NEG  KETONE NEG mg/dL  NEG  BLOOD SMALL A NEG  PROTEIN TRACE mg/dL  NEG  UROBILINOGEN 0.2 mg/dL  5.7-8.4  NITRITE NEG  NEG  LEUKOCYTE ESTERASE NEG  NEG  SQUAMOUS EPITHELIAL/HPF RARE  RARE  WBC 0-2 WBC/hpf  <3  RBC 0-2 RBC/hpf  <3  BACTERIA RARE  RARE  MICROSCOPIC EXAM PERFORMED ON UNCONCENTRATED URINE  CRYSTALS NONE SEEN  NONE SEEN  CASTS NONE SEEN  NONE SEEN   AU CT-STONE PROTOCOL 09May2014 12:00AM Seward Grater   Test Name Result Flag Reference  ** RADIOLOGY REPORT BY [facility] **   *RADIOLOGY REPORT*  Clinical Data: Right flank pain. Nausea. Hematuria. Urolithiasis.  CT ABDOMEN AND PELVIS WITHOUT CONTRAST (URINARY CALCULUS PROTOCOL)  Technique: Multidetector CT imaging was performed through the abdomen and pelvis without intravenous contrast to include the urinary tract.  Comparison: None.  Findings: Moderate right sided hydronephrosis and ureterectasis is seen. There are two adjacent calculi in the mid-pelvic portion of the right ureter where it crosses the iliac vessels, each measuring approximately 6 mm in diameter. No evidence of left ureteral calculi or hydronephrosis.  Several fluid attenuation renal cysts are noted bilaterally, as well as a 1.2 cm hemorrhagic or proteinaceous cyst in the lateral mid pole of the right kidney. Moderately enlarged prostate gland is seen. Bladder shows several diverticula, consistent with chronic bladder outlet obstruction.  The visualized portions of the other abdominal parenchymal organs are unremarkable in appearance on this noncontrast study. No evidence of retroperitoneal or pelvic lymphadenopathy. Sigmoid diverticulosis is demonstrated, however there is no evidence of diverticulitis. Surgical mesh is seen within the anterior abdominal wall soft tissues. No evidence of  recurrent hernia.  IMPRESSION:  1. Moderate right-sided hydronephrosis and ureterectasis, with two adjacent obstructing calculi in the mid-pelvic portion of the right ureter each measuring approximately 6 mm. 2. Moderately enlarged prostate and findings of chronic bladder outlet obstruction. 3. Diverticulosis. No radiographic evidence of diverticulitis.   Original Report Authenticated By: Myles Rosenthal, M.D.   Assessment Assessed  1. Mid Ureteral Stone On The Right 592.1   His mid ureteral stone is well seen on KUB and was at about L3 in November.   I thought it was a bone spur then.   Plan Mid Ureteral Stone On The Right (592.1)  1. Follow-up Schedule Surgery Office  Follow-up  Requested for: 12May2014   I discussed ESWL vs ureteroscopy and he would like to proceed with right ESWL.   I reviewed the risks of bleeding, infection, injury to adjacent structures, need for secondary procedures, thrombotic events and anesthetic complications.

## 2012-08-29 ENCOUNTER — Ambulatory Visit (HOSPITAL_COMMUNITY): Payer: Medicare Other

## 2012-08-29 ENCOUNTER — Ambulatory Visit (HOSPITAL_COMMUNITY)
Admission: RE | Admit: 2012-08-29 | Discharge: 2012-08-29 | Disposition: A | Discharge status: Home / Self Care | Payer: Medicare Other | Source: Ambulatory Visit | Attending: Urology | Admitting: Urology

## 2012-08-29 ENCOUNTER — Encounter (HOSPITAL_COMMUNITY): Payer: Self-pay | Admitting: General Practice

## 2012-08-29 ENCOUNTER — Encounter (HOSPITAL_COMMUNITY)
Admission: RE | Disposition: A | Discharge status: Home / Self Care | Payer: Self-pay | Source: Ambulatory Visit | Attending: Urology

## 2012-08-29 DIAGNOSIS — Z79899 Other long term (current) drug therapy: Secondary | ICD-10-CM | POA: Insufficient documentation

## 2012-08-29 DIAGNOSIS — K5730 Diverticulosis of large intestine without perforation or abscess without bleeding: Secondary | ICD-10-CM | POA: Insufficient documentation

## 2012-08-29 DIAGNOSIS — K219 Gastro-esophageal reflux disease without esophagitis: Secondary | ICD-10-CM | POA: Insufficient documentation

## 2012-08-29 DIAGNOSIS — N201 Calculus of ureter: Secondary | ICD-10-CM | POA: Insufficient documentation

## 2012-08-29 DIAGNOSIS — Z881 Allergy status to other antibiotic agents status: Secondary | ICD-10-CM | POA: Insufficient documentation

## 2012-08-29 DIAGNOSIS — C61 Malignant neoplasm of prostate: Secondary | ICD-10-CM | POA: Insufficient documentation

## 2012-08-29 DIAGNOSIS — Z885 Allergy status to narcotic agent status: Secondary | ICD-10-CM | POA: Insufficient documentation

## 2012-08-29 DIAGNOSIS — M069 Rheumatoid arthritis, unspecified: Secondary | ICD-10-CM | POA: Insufficient documentation

## 2012-08-29 DIAGNOSIS — IMO0002 Reserved for concepts with insufficient information to code with codable children: Secondary | ICD-10-CM | POA: Insufficient documentation

## 2012-08-29 DIAGNOSIS — N200 Calculus of kidney: Secondary | ICD-10-CM | POA: Insufficient documentation

## 2012-08-29 DIAGNOSIS — Z880 Allergy status to penicillin: Secondary | ICD-10-CM | POA: Insufficient documentation

## 2012-08-29 DIAGNOSIS — Z96659 Presence of unspecified artificial knee joint: Secondary | ICD-10-CM | POA: Insufficient documentation

## 2012-08-29 DIAGNOSIS — N529 Male erectile dysfunction, unspecified: Secondary | ICD-10-CM | POA: Insufficient documentation

## 2012-08-29 DIAGNOSIS — Z8711 Personal history of peptic ulcer disease: Secondary | ICD-10-CM | POA: Insufficient documentation

## 2012-08-29 DIAGNOSIS — N3941 Urge incontinence: Secondary | ICD-10-CM | POA: Insufficient documentation

## 2012-08-29 SURGERY — LITHOTRIPSY, ESWL
Anesthesia: LOCAL | Laterality: Right

## 2012-08-29 MED ORDER — FENTANYL CITRATE 0.05 MG/ML IJ SOLN: 25.0000 ug | INTRAMUSCULAR | Status: DC | PRN

## 2012-08-29 MED ORDER — CIPROFLOXACIN HCL 500 MG PO TABS
500.0000 mg | ORAL_TABLET | ORAL | Status: AC
  Administered 2012-08-29: 500 mg via ORAL
  Filled 2012-08-29: qty 1

## 2012-08-29 MED ORDER — DIAZEPAM 5 MG PO TABS
10.0000 mg | ORAL_TABLET | ORAL | Status: AC
  Administered 2012-08-29: 10 mg via ORAL
  Filled 2012-08-29: qty 2

## 2012-08-29 MED ORDER — ACETAMINOPHEN 650 MG RE SUPP
650.0000 mg | RECTAL | Status: DC | PRN
  Filled 2012-08-29: qty 1

## 2012-08-29 MED ORDER — SODIUM CHLORIDE 0.9 % IJ SOLN: 3.0000 mL | INTRAMUSCULAR | Status: DC | PRN

## 2012-08-29 MED ORDER — SODIUM CHLORIDE 0.9 % IV SOLN: 250.0000 mL | INTRAVENOUS | Status: DC | PRN

## 2012-08-29 MED ORDER — DIPHENHYDRAMINE HCL 25 MG PO CAPS
25.0000 mg | ORAL_CAPSULE | ORAL | Status: AC
  Administered 2012-08-29: 25 mg via ORAL
  Filled 2012-08-29: qty 1

## 2012-08-29 MED ORDER — ONDANSETRON HCL 4 MG/2ML IJ SOLN: 4.0000 mg | Freq: Four times a day (QID) | INTRAMUSCULAR | Status: DC | PRN

## 2012-08-29 MED ORDER — SODIUM CHLORIDE 0.9 % IJ SOLN: 3.0000 mL | Freq: Two times a day (BID) | INTRAMUSCULAR | Status: DC

## 2012-08-29 MED ORDER — DEXTROSE-NACL 5-0.45 % IV SOLN
INTRAVENOUS | Status: DC
  Administered 2012-08-29: 07:00:00 via INTRAVENOUS

## 2012-08-29 MED ORDER — ACETAMINOPHEN 325 MG PO TABS: 650.0000 mg | ORAL_TABLET | ORAL | Status: DC | PRN

## 2012-08-29 NOTE — Progress Notes (Signed)
Returned from [employer] and has softball sized pink area right groin . Pt was positioned prone for treatment of stone

## 2012-09-06 ENCOUNTER — Encounter (INDEPENDENT_AMBULATORY_CARE_PROVIDER_SITE_OTHER): Payer: Self-pay

## 2012-09-12 ENCOUNTER — Other Ambulatory Visit: Payer: Self-pay | Admitting: Gastroenterology

## 2012-10-01 ENCOUNTER — Ambulatory Visit (HOSPITAL_COMMUNITY)
Admission: RE | Admit: 2012-10-01 | Discharge: 2012-10-01 | Disposition: A | Discharge status: Home / Self Care | Payer: Medicare Other | Source: Ambulatory Visit | Attending: Gastroenterology | Admitting: Gastroenterology

## 2012-10-01 ENCOUNTER — Encounter (HOSPITAL_COMMUNITY)
Admission: RE | Disposition: A | Discharge status: Home / Self Care | Payer: Self-pay | Source: Ambulatory Visit | Attending: Gastroenterology

## 2012-10-01 ENCOUNTER — Encounter (HOSPITAL_COMMUNITY): Payer: Self-pay

## 2012-10-01 DIAGNOSIS — M069 Rheumatoid arthritis, unspecified: Secondary | ICD-10-CM | POA: Insufficient documentation

## 2012-10-01 DIAGNOSIS — K449 Diaphragmatic hernia without obstruction or gangrene: Secondary | ICD-10-CM | POA: Insufficient documentation

## 2012-10-01 DIAGNOSIS — Z79899 Other long term (current) drug therapy: Secondary | ICD-10-CM | POA: Insufficient documentation

## 2012-10-01 DIAGNOSIS — M810 Age-related osteoporosis without current pathological fracture: Secondary | ICD-10-CM | POA: Insufficient documentation

## 2012-10-01 DIAGNOSIS — K219 Gastro-esophageal reflux disease without esophagitis: Secondary | ICD-10-CM | POA: Insufficient documentation

## 2012-10-01 DIAGNOSIS — Z8546 Personal history of malignant neoplasm of prostate: Secondary | ICD-10-CM | POA: Insufficient documentation

## 2012-10-01 DIAGNOSIS — R1310 Dysphagia, unspecified: Secondary | ICD-10-CM | POA: Insufficient documentation

## 2012-10-01 HISTORY — PX: ESOPHAGOGASTRODUODENOSCOPY: SHX5428

## 2012-10-01 HISTORY — PX: BALLOON DILATION: SHX5330

## 2012-10-01 SURGERY — EGD (ESOPHAGOGASTRODUODENOSCOPY)
Anesthesia: Moderate Sedation

## 2012-10-01 MED ORDER — FENTANYL CITRATE 0.05 MG/ML IJ SOLN
INTRAMUSCULAR | Status: DC | PRN
  Administered 2012-10-01: 75 ug via INTRAVENOUS

## 2012-10-01 MED ORDER — SODIUM CHLORIDE 0.9 % IV SOLN: INTRAVENOUS | Status: DC

## 2012-10-01 MED ORDER — MIDAZOLAM HCL 10 MG/2ML IJ SOLN
INTRAMUSCULAR | Status: AC
  Filled 2012-10-01: qty 2

## 2012-10-01 MED ORDER — BUTAMBEN-TETRACAINE-BENZOCAINE 2-2-14 % EX AERO
INHALATION_SPRAY | CUTANEOUS | Status: DC | PRN
  Administered 2012-10-01: 2 via TOPICAL

## 2012-10-01 MED ORDER — MIDAZOLAM HCL 10 MG/2ML IJ SOLN
INTRAMUSCULAR | Status: DC | PRN
  Administered 2012-10-01: 5 mg via INTRAVENOUS

## 2012-10-01 MED ORDER — FENTANYL CITRATE 0.05 MG/ML IJ SOLN
INTRAMUSCULAR | Status: AC
  Filled 2012-10-01: qty 2

## 2012-10-01 NOTE — H&P (Signed)
  Problem: Dysphagia on Fosamax.  History: The patient is a 72 year old male born [birth year]. 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 He has undergone Nissen fundoplication surgery. On 09/29/2010, is an esophagogastroduodenoscopy was normal post Nissen fundoplication. On 08/28/2002, the patient underwent a normal screening colonoscopy.  The patient was prescribed Fosamax 1 tablet weekly to treat osteoporosis.  The patient has developed solid food esophageal dysphagia without odynophagia.  The patient is scheduled to undergo a diagnostic esophagogastroduodenoscopy with dilation of a distal esophageal stricture if necessary.  Past medical and surgical history: Rheumatoid arthritis. Allergic rhinitis. Osteoporosis. Monoclonal gammopathy. Thyroid cyst. Prostate cancer. Gastroesophageal reflux with a benign distal esophageal stricture every rectal fissure repair. Left knee replacement surgery. Inguinal herniorrhaphies. Abdominal hernia repair.  Medication allergies: Clindamycin causes hives. Codeine causes hives. Penicillin causes rash. Remicade causes itching.  Exam: The patient is alert and lying comfortably on the endoscopy stretcher. Abdomen is soft, flat, and nontender to palpation. Cardiac exam reveals a regular rhythm. Lungs are clear to auscultation.  Plan: Proceed with diagnostic esophagogastroduodenoscopy and possible esophageal stricture dilation to evaluate solid food esophageal dysphagia post this and fundoplication surgery.

## 2012-10-01 NOTE — Op Note (Signed)
Procedure: Diagnostic esophagogastroduodenoscopy with esophageal balloon dilation to evaluate solid food esophageal dysphagia post Nissen fundoplication surgery for gastroesophageal reflux.  Endoscopist: Danise Edge  Premedication: Versed 7.5 mg. Fentanyl 50 mcg.  Procedure: The patient was placed in the left lateral decubitus position. The Pentax gastroscope was passed through the posterior hypopharynx into the proximal esophagus without difficulty. The hypopharynx, larynx, and vocal cords appeared normal.  Esophagoscopy: The proximal, mid, and lower segments of the esophageal mucosa appear normal. The squamocolumnar junction is noted at 36 cm from the incisor teeth. There is no endoscopic evidence for the presence of erosive esophagitis, esophageal stricture formation, or Barrett's esophagus. Using the CRE esophageal balloon dilator, the balloon was inflated to 18 mm at the esophagogastric junction without mucosal disruption indicative of the absence of an esophageal stricture.  Gastroscopy: There is a small hiatal hernia. Retroflexed view of the gastric cardia and fundus was normal. The diaphragmatic hiatus is somewhat patulous post Nissen fundoplication surgery. The gastric body, antrum, and pylorus appeared normal.  Duodenoscopy: The duodenal bulb and descending duodenum appear normal.  Assessment: Normal esophagogastroduodenoscopy post Nissen fundoplication surgery. A small hiatal hernia is present in the diaphragmatic hiatus is somewhat patulous. There is no endoscopic evidence for the presence of an esophageal stricture.  Recommendations: The patient should not take Fosamax tablets. I recommend switching to Fosamax liquid.

## 2012-10-02 ENCOUNTER — Encounter (HOSPITAL_COMMUNITY): Payer: Self-pay | Admitting: Gastroenterology

## 2012-10-31 ENCOUNTER — Other Ambulatory Visit: Payer: Self-pay | Admitting: Urology

## 2012-11-15 DIAGNOSIS — N201 Calculus of ureter: Secondary | ICD-10-CM

## 2012-11-15 HISTORY — DX: Calculus of ureter: N20.1

## 2012-11-25 ENCOUNTER — Encounter (HOSPITAL_COMMUNITY): Payer: Self-pay | Admitting: Pharmacy Technician

## 2012-11-26 ENCOUNTER — Other Ambulatory Visit (HOSPITAL_COMMUNITY): Payer: Self-pay | Admitting: *Deleted

## 2012-11-28 ENCOUNTER — Encounter (HOSPITAL_COMMUNITY)
Admission: RE | Admit: 2012-11-28 | Discharge: 2012-11-28 | Disposition: A | Discharge status: Home / Self Care | Payer: Medicare Other | Source: Ambulatory Visit | Attending: Urology | Admitting: Urology

## 2012-11-28 ENCOUNTER — Encounter (HOSPITAL_COMMUNITY): Payer: Self-pay

## 2012-11-28 DIAGNOSIS — N138 Other obstructive and reflux uropathy: Secondary | ICD-10-CM | POA: Insufficient documentation

## 2012-11-28 DIAGNOSIS — Z01812 Encounter for preprocedural laboratory examination: Secondary | ICD-10-CM | POA: Insufficient documentation

## 2012-11-28 DIAGNOSIS — N401 Enlarged prostate with lower urinary tract symptoms: Secondary | ICD-10-CM | POA: Insufficient documentation

## 2012-11-28 DIAGNOSIS — N139 Obstructive and reflux uropathy, unspecified: Secondary | ICD-10-CM | POA: Insufficient documentation

## 2012-11-28 DIAGNOSIS — N201 Calculus of ureter: Secondary | ICD-10-CM | POA: Insufficient documentation

## 2012-11-28 HISTORY — DX: Personal history of urinary calculi: Z87.442

## 2012-11-28 HISTORY — DX: Rash and other nonspecific skin eruption: R21

## 2012-11-28 HISTORY — DX: Personal history of other specified conditions: Z87.898

## 2012-11-28 HISTORY — DX: Personal history of other diseases of the digestive system: Z87.19

## 2012-11-28 LAB — CBC
HCT: 39.9 % (ref 39.0–52.0)
Hemoglobin: 12.4 g/dL — ABNORMAL LOW (ref 13.0–17.0)
MCH: 25.4 pg — ABNORMAL LOW (ref 26.0–34.0)
MCHC: 31.1 g/dL (ref 30.0–36.0)
MCV: 81.6 fL (ref 78.0–100.0)
RBC: 4.89 MIL/uL (ref 4.22–5.81)

## 2012-11-28 LAB — BASIC METABOLIC PANEL
BUN: 14 mg/dL (ref 6–23)
CO2: 30 mEq/L (ref 19–32)
GFR calc non Af Amer: 82 mL/min — ABNORMAL LOW (ref >90)
Glucose, Bld: 95 mg/dL (ref 70–99)
Potassium: 4 mEq/L (ref 3.5–5.1)

## 2012-11-28 NOTE — Patient Instructions (Addendum)
[name]  11/28/2012                           YOUR PROCEDURE IS SCHEDULED ON:  12/05/12               PLEASE REPORT TO SHORT STAY CENTER AT :  7:30 AM               CALL THIS NUMBER IF ANY PROBLEMS THE DAY OF SURGERY :               [phone]                      REMEMBER:   Do not eat food or drink liquids AFTER MIDNIGHT   Take these medicines the morning of surgery with A SIP OF WATER: tylenol / omeprazole / prednisone / tramadol if needed   Do not wear jewelry, make-up   Do not wear lotions, powders, or perfumes.   Do not shave legs or underarms 12 hrs. before surgery (men may shave face)  Do not bring valuables to the hospital.  Contacts, dentures or bridgework may not be worn into surgery.  Leave suitcase in the car. After surgery it may be brought to your room.  For patients admitted to the hospital more than one night, checkout time is 11:00                          The day of discharge.   Patients discharged the day of surgery will not be allowed to drive home                             If going home same day of surgery, must have someone stay with you first                           24 hrs at home and arrange for some one to drive you home from hospital.    Special Instructions:   Please read over the following fact sheets that you were given:                        1. [name] PREPARING FOR SURGERY SHEET                                                X_____________________________________________________________________        Failure to follow these instructions may result in cancellation of your surgery

## 2012-12-04 NOTE — H&P (Signed)
ctive Problems Problems  1. Benign Prostatic Hypertrophy With Urinary Obstruction 600.01 2. Feelings Of Urinary Urgency 788.63 3. Malignant Prostatic Neoplasm T1c 4. Microscopic Hematuria 599.72 5. Mid Ureteral Stone On The Right 592.1 6. History of  Nephrolithiasis V13.01 7. 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 Prostate Cancer 185 10. PSA,Elevated 790.93 11. Urge Incontinence Of Urine 788.31 12. Urinary Tract Infection 599.0  History of Present Illness  [name] returns today in f/u.  He had right ESWL for a mid ureteral stone on 5/15.  A KUB on 5/28 showed the stone in 2 fragments in the distal ureter.  A KUB today shows both the stones at the RUVJ.   He has had an occasional pain in the right flank.  He has had intermittant hematuria as well.    He has BPH with BOO.  He was given tamsulosin but had side effects and had to stop it.  His PVR today is 117cc and his IPSS is 29.  He reports increased LUTS over the last month.   He has T1c prostate cancer that is being managed with surveillance and his follow up biopsy in 2010 was actually negative.  His PSA in January was down from the high to 4.34.   Past Medical History Problems  1. History of  Arthritis V13.4 2. History of  Duodenal Ulcer 532.90 3. History of  Esophageal Reflux 530.81 4. History of  Nephrolithiasis V13.01 5. History of  Nocturia 788.43 6. History of  Rheumatoid Arthritis 714.0  Surgical History Problems  1. History of  Anterior Gastropexy For Hiatal Hernia 2. History of  Anterior Gastropexy For Hiatal Hernia 3. History of  Colon Surgery 4. History of  Cystoscopy With Ureteroscopy 5. History of  Esophageal Dilation 6. History of  Incisional Hernia Repair With Implantation Of Mesh 7. History of  Inguinal Hernia Repair 8. History of  Intestinal Surgery 9. History of  Kidney Surgery 10. History of  Knee Replacement 11. History of  Lithotripsy 12. History of  Lithotripsy  Current Meds 1. ICaps MV  TABS; Therapy: (Recorded:13Jan2014) to 2. Leflunomide 20 MG Oral Tablet; Therapy: 29Jan2013 to 3. Levitra 10 MG Oral Tablet; TAKE 1 TABLET As Directed PRN; Therapy: 13Jan2014 to (Last  Rx:13Jan2014)  Requested for: 13Jan2014 4. Multi-Vitamin TABS; Therapy: (Recorded:30Apr2009) to 5. Omeprazole 20 MG Oral Capsule Delayed Release; Therapy: 11Oct2012 to 6. PredniSONE 5 MG Oral Tablet; Therapy: 29Jan2013 to 7. Tamsulosin HCl 0.4 MG Oral Capsule; TAKE 1 CAPSULE Daily with meals; Therapy: 18Nov2013  to (Evaluate:13Nov2014)  Requested for: 18Nov2013; Last Rx:18Nov2013 8. TraMADol HCl 50 MG Oral Tablet; Therapy: 21May2013 to 9. Tylenol Arthritis Pain TBCR; Therapy: (Recorded:13Jan2014) to 10. Vitamin D TABS; Therapy: (Recorded:18Nov2013) to  Allergies Medication  1. Clindamycin 2. Codeine Derivatives 3. Penicillins  Family History Problems  1. Family history of  Family Health Status Number Of Children 1 son1 daughter  Social History Problems  1. Caffeine Use 2. Marital History - Currently Married 3. 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 History of  Alcohol Use   Past and social history reviewed and  updated.   Review of Systems  Genitourinary: feelings of urinary urgency, incontinence, weak urinary stream, urinary stream starts and stops and hematuria.  Cardiovascular: no chest pain.  Respiratory: no shortness of breath.    Vitals Vital Signs [Data Includes: Last 1 Day]  16Jul2014 12:26PM  Blood Pressure: 148 / 79 Temperature: 96.9 F Heart Rate: 57  Physical Exam Constitutional: Well nourished and well  developed . No acute distress.  Neck: The appearance of the neck is normal and no neck mass is present.  Pulmonary: No respiratory distress and normal respiratory rhythm and effort.  Cardiovascular: Heart rate and rhythm are normal . No peripheral edema.  Abdomen: The abdomen is soft and nontender. No masses are palpated. No CVA tenderness.  No hernias are palpable. No hepatosplenomegaly noted.    Results/Data Urine [Data Includes: Last 1 Day]   16Jul2014  COLOR YELLOW   APPEARANCE CLEAR   SPECIFIC GRAVITY 1.020   pH 5.5   GLUCOSE NEG mg/dL  BILIRUBIN NEG   KETONE NEG mg/dL  BLOOD NEG   PROTEIN NEG mg/dL  UROBILINOGEN 0.2 mg/dL  NITRITE NEG   LEUKOCYTE ESTERASE NEG    The following images/tracing/specimen were independently visualized:  KUB today shows at the RUVJ. They measure about 4x27mm. There are no other stones. He has hernia screws in the abdomen. The films is otherwise unchanged.  The following clinical lab reports were reviewed:  UA today is clear.    Assessment Assessed  1. Mid Ureteral Stone On The Right 592.1 2. Benign Prostatic Hypertrophy With Urinary Obstruction 600.01   The stones are at the UVJ on the right and he has intermittant symptoms. His LUTS is worse and he has known BPH with BOO.   Plan Benign Prostatic Hypertrophy With Urinary Obstruction (600.01)  1. Follow-up Schedule Surgery Office  Follow-up  Requested for: 16Jul2014 Health Maintenance (V70.0)  2. UA With REFLEX  Done: 16Jul2014 11:18AM   I discussed the options and will get him set up for right ureteroscopic stone extraction and TURP.   I reviewed the risks of bleeding, infection, incontinence, stricture, sexual dysfunction, need for a stent, injury to the ureter or adjacent structures, thrombotic events and anesthetic complications.   He wants to wait about a month because of some work he is doing.

## 2012-12-05 ENCOUNTER — Encounter (HOSPITAL_COMMUNITY): Payer: Self-pay

## 2012-12-05 ENCOUNTER — Ambulatory Visit (HOSPITAL_COMMUNITY): Payer: Medicare Other

## 2012-12-05 ENCOUNTER — Observation Stay (HOSPITAL_COMMUNITY)
Admission: RE | Admit: 2012-12-05 | Discharge: 2012-12-07 | Disposition: A | Discharge status: Home / Self Care | Payer: Medicare Other | Source: Ambulatory Visit | Attending: Urology | Admitting: Urology

## 2012-12-05 ENCOUNTER — Encounter (HOSPITAL_COMMUNITY): Payer: Self-pay | Admitting: Anesthesiology

## 2012-12-05 ENCOUNTER — Ambulatory Visit (HOSPITAL_COMMUNITY): Payer: Medicare Other | Admitting: Anesthesiology

## 2012-12-05 ENCOUNTER — Encounter (HOSPITAL_COMMUNITY)
Admission: RE | Disposition: A | Discharge status: Home / Self Care | Payer: Self-pay | Source: Ambulatory Visit | Attending: Urology

## 2012-12-05 DIAGNOSIS — N139 Obstructive and reflux uropathy, unspecified: Secondary | ICD-10-CM | POA: Insufficient documentation

## 2012-12-05 DIAGNOSIS — N201 Calculus of ureter: Secondary | ICD-10-CM | POA: Insufficient documentation

## 2012-12-05 DIAGNOSIS — N138 Other obstructive and reflux uropathy: Principal | ICD-10-CM | POA: Insufficient documentation

## 2012-12-05 DIAGNOSIS — C61 Malignant neoplasm of prostate: Secondary | ICD-10-CM | POA: Insufficient documentation

## 2012-12-05 DIAGNOSIS — N529 Male erectile dysfunction, unspecified: Secondary | ICD-10-CM | POA: Insufficient documentation

## 2012-12-05 DIAGNOSIS — Z79899 Other long term (current) drug therapy: Secondary | ICD-10-CM | POA: Insufficient documentation

## 2012-12-05 DIAGNOSIS — M069 Rheumatoid arthritis, unspecified: Secondary | ICD-10-CM | POA: Insufficient documentation

## 2012-12-05 DIAGNOSIS — N4289 Other specified disorders of prostate: Secondary | ICD-10-CM | POA: Insufficient documentation

## 2012-12-05 DIAGNOSIS — K219 Gastro-esophageal reflux disease without esophagitis: Secondary | ICD-10-CM | POA: Insufficient documentation

## 2012-12-05 DIAGNOSIS — N401 Enlarged prostate with lower urinary tract symptoms: Secondary | ICD-10-CM | POA: Insufficient documentation

## 2012-12-05 DIAGNOSIS — N3941 Urge incontinence: Secondary | ICD-10-CM | POA: Insufficient documentation

## 2012-12-05 HISTORY — PX: TRANSURETHRAL PROSTATECTOMY WITH GYRUS INSTRUMENTS: SHX6153

## 2012-12-05 HISTORY — PX: CYSTOSCOPY WITH URETEROSCOPY: SHX5123

## 2012-12-05 SURGERY — CYSTOSCOPY WITH URETEROSCOPY
Anesthesia: General | Laterality: Right | Wound class: Clean Contaminated

## 2012-12-05 MED ORDER — ACETAMINOPHEN 325 MG PO TABS: 650.0000 mg | ORAL_TABLET | ORAL | Status: DC | PRN

## 2012-12-05 MED ORDER — DEXAMETHASONE SODIUM PHOSPHATE 10 MG/ML IJ SOLN
INTRAMUSCULAR | Status: DC | PRN
  Administered 2012-12-05: 10 mg via INTRAVENOUS

## 2012-12-05 MED ORDER — LIDOCAINE HCL (CARDIAC) 20 MG/ML IV SOLN
INTRAVENOUS | Status: DC | PRN
  Administered 2012-12-05: 100 mg via INTRAVENOUS

## 2012-12-05 MED ORDER — PROPOFOL 10 MG/ML IV BOLUS
INTRAVENOUS | Status: DC | PRN
  Administered 2012-12-05: 150 mg via INTRAVENOUS

## 2012-12-05 MED ORDER — BISACODYL 10 MG RE SUPP: 10.0000 mg | Freq: Every day | RECTAL | Status: DC | PRN

## 2012-12-05 MED ORDER — LEFLUNOMIDE 10 MG PO TABS
20.0000 mg | ORAL_TABLET | Freq: Every day | ORAL | Status: DC
  Administered 2012-12-05 – 2012-12-07 (×3): 20 mg via ORAL
  Filled 2012-12-05 (×3): qty 2

## 2012-12-05 MED ORDER — HYDRALAZINE HCL 20 MG/ML IJ SOLN
5.0000 mg | INTRAMUSCULAR | Status: AC
  Administered 2012-12-05: 5 mg via INTRAVENOUS
  Filled 2012-12-05: qty 1

## 2012-12-05 MED ORDER — LACTATED RINGERS IV SOLN: INTRAVENOUS | Status: DC

## 2012-12-05 MED ORDER — HYDRALAZINE HCL 20 MG/ML IJ SOLN
5.0000 mg | INTRAMUSCULAR | Status: DC
  Administered 2012-12-05: 5 mg via INTRAVENOUS
  Filled 2012-12-05: qty 1

## 2012-12-05 MED ORDER — TRAMADOL HCL 50 MG PO TABS
50.0000 mg | ORAL_TABLET | Freq: Four times a day (QID) | ORAL | Status: DC | PRN
  Administered 2012-12-06 – 2012-12-07 (×2): 50 mg via ORAL
  Filled 2012-12-05 (×2): qty 1

## 2012-12-05 MED ORDER — DOCUSATE SODIUM 100 MG PO CAPS: 100.0000 mg | ORAL_CAPSULE | Freq: Two times a day (BID) | ORAL | Status: DC

## 2012-12-05 MED ORDER — PREDNISONE 5 MG PO TABS
5.0000 mg | ORAL_TABLET | Freq: Every day | ORAL | Status: DC
  Administered 2012-12-06 – 2012-12-07 (×2): 5 mg via ORAL
  Filled 2012-12-05 (×2): qty 1

## 2012-12-05 MED ORDER — LACTATED RINGERS IV SOLN
INTRAVENOUS | Status: DC | PRN
  Administered 2012-12-05 (×2): via INTRAVENOUS

## 2012-12-05 MED ORDER — IOHEXOL 300 MG/ML  SOLN
INTRAMUSCULAR | Status: DC | PRN
  Administered 2012-12-05: 3 mL

## 2012-12-05 MED ORDER — CIPROFLOXACIN IN D5W 400 MG/200ML IV SOLN
400.0000 mg | INTRAVENOUS | Status: AC
  Administered 2012-12-05: 400 mg via INTRAVENOUS

## 2012-12-05 MED ORDER — HYDROMORPHONE HCL PF 1 MG/ML IJ SOLN
0.5000 mg | INTRAMUSCULAR | Status: DC | PRN
Start: 2012-12-05 — End: 2012-12-07
  Administered 2012-12-05 (×3): 1 mg via INTRAVENOUS
  Filled 2012-12-05 (×2): qty 1

## 2012-12-05 MED ORDER — KCL IN DEXTROSE-NACL 20-5-0.45 MEQ/L-%-% IV SOLN
INTRAVENOUS | Status: DC
  Administered 2012-12-05: 14:00:00 via INTRAVENOUS
  Filled 2012-12-05 (×3): qty 1000

## 2012-12-05 MED ORDER — ONDANSETRON HCL 4 MG/2ML IJ SOLN
4.0000 mg | INTRAMUSCULAR | Status: DC | PRN
  Administered 2012-12-05: 4 mg via INTRAVENOUS
  Filled 2012-12-05: qty 2

## 2012-12-05 MED ORDER — DOCUSATE SODIUM 100 MG PO CAPS
100.0000 mg | ORAL_CAPSULE | Freq: Two times a day (BID) | ORAL | Status: DC
  Administered 2012-12-05 – 2012-12-07 (×4): 100 mg via ORAL
  Filled 2012-12-05 (×5): qty 1

## 2012-12-05 MED ORDER — LEFLUNOMIDE 20 MG PO TABS
20.0000 mg | ORAL_TABLET | Freq: Every morning | ORAL | Status: DC
  Filled 2012-12-05: qty 1

## 2012-12-05 MED ORDER — FENTANYL CITRATE 0.05 MG/ML IJ SOLN
INTRAMUSCULAR | Status: DC | PRN
  Administered 2012-12-05 (×2): 50 ug via INTRAVENOUS
  Administered 2012-12-05: 100 ug via INTRAVENOUS
  Administered 2012-12-05: 50 ug via INTRAVENOUS

## 2012-12-05 MED ORDER — PANTOPRAZOLE SODIUM 40 MG PO TBEC
40.0000 mg | DELAYED_RELEASE_TABLET | Freq: Every day | ORAL | Status: DC
  Administered 2012-12-06 – 2012-12-07 (×2): 40 mg via ORAL
  Filled 2012-12-05 (×2): qty 1

## 2012-12-05 MED ORDER — SODIUM CHLORIDE 0.9 % IR SOLN
3000.0000 mL | Status: AC
  Administered 2012-12-05: 3000 mL

## 2012-12-05 MED ORDER — HYOSCYAMINE SULFATE 0.125 MG SL SUBL
0.1250 mg | SUBLINGUAL_TABLET | SUBLINGUAL | Status: DC | PRN
  Administered 2012-12-05: 0.125 mg via ORAL
  Filled 2012-12-05: qty 1

## 2012-12-05 MED ORDER — PROMETHAZINE HCL 25 MG/ML IJ SOLN: 6.2500 mg | INTRAMUSCULAR | Status: DC | PRN

## 2012-12-05 MED ORDER — CIPROFLOXACIN HCL 500 MG PO TABS
500.0000 mg | ORAL_TABLET | Freq: Two times a day (BID) | ORAL | Status: DC
  Administered 2012-12-05 – 2012-12-07 (×5): 500 mg via ORAL
  Filled 2012-12-05 (×7): qty 1

## 2012-12-05 MED ORDER — CIPROFLOXACIN HCL 500 MG PO TABS: 500.0000 mg | ORAL_TABLET | Freq: Two times a day (BID) | ORAL | Status: DC

## 2012-12-05 MED ORDER — ZOLPIDEM TARTRATE 5 MG PO TABS: 5.0000 mg | ORAL_TABLET | Freq: Every evening | ORAL | Status: DC | PRN

## 2012-12-05 MED ORDER — HYDROCORTISONE 1 % EX CREA
1.0000 "application " | TOPICAL_CREAM | Freq: Four times a day (QID) | CUTANEOUS | Status: DC
  Administered 2012-12-05 – 2012-12-07 (×6): 1 via TOPICAL
  Filled 2012-12-05 (×2): qty 28

## 2012-12-05 MED ORDER — SODIUM CHLORIDE 0.9 % IR SOLN
Status: DC | PRN
  Administered 2012-12-05: 22000 mL via INTRAVESICAL

## 2012-12-05 MED ORDER — ONDANSETRON HCL 4 MG/2ML IJ SOLN
INTRAMUSCULAR | Status: DC | PRN
  Administered 2012-12-05: 4 mg via INTRAVENOUS

## 2012-12-05 MED ORDER — HYDROMORPHONE HCL PF 1 MG/ML IJ SOLN
INTRAMUSCULAR | Status: AC
  Administered 2012-12-05: 1 mg
  Filled 2012-12-05: qty 1

## 2012-12-05 MED ORDER — MIDAZOLAM HCL 5 MG/5ML IJ SOLN
INTRAMUSCULAR | Status: DC | PRN
  Administered 2012-12-05: 2 mg via INTRAVENOUS

## 2012-12-05 MED ORDER — HYDROMORPHONE HCL PF 1 MG/ML IJ SOLN
0.2500 mg | INTRAMUSCULAR | Status: DC | PRN
  Administered 2012-12-05 (×2): 0.5 mg via INTRAVENOUS

## 2012-12-05 SURGICAL SUPPLY — 30 items
BAG URINE DRAINAGE (UROLOGICAL SUPPLIES) IMPLANT
BAG URO CATCHER STRL LF (DRAPE) ×3 IMPLANT
BASKET LASER NITINOL 1.9FR (BASKET) ×3 IMPLANT
BLADE SURG 15 STRL LF DISP TIS (BLADE) IMPLANT
BLADE SURG 15 STRL SS (BLADE)
BSKT STON RTRVL 120 1.9FR (BASKET) ×2
CATH FOLEY 3WAY 30CC 22FR (CATHETERS) ×1 IMPLANT
CATH URET 5FR 28IN OPEN ENDED (CATHETERS) ×3 IMPLANT
CLOTH BEACON ORANGE TIMEOUT ST (SAFETY) ×3 IMPLANT
DRAPE CAMERA CLOSED 9X96 (DRAPES) ×3 IMPLANT
ELECT BUTTON HF 24-28F 2 30DE (ELECTRODE) ×2 IMPLANT
ELECT HF RESECT BIPO 24F 45 ND (CUTTING LOOP) ×2 IMPLANT
ELECT LOOP MED HF 24F 12D (CUTTING LOOP) ×2 IMPLANT
ELECT LOOP MED HF 24F 12D CBL (CLIP) ×1 IMPLANT
ELECT REM PT RETURN 9FT ADLT (ELECTROSURGICAL)
ELECTRODE REM PT RTRN 9FT ADLT (ELECTROSURGICAL) ×2 IMPLANT
GLOVE SURG SS PI 8.0 STRL IVOR (GLOVE) ×3 IMPLANT
GOWN PREVENTION PLUS XLARGE (GOWN DISPOSABLE) ×3 IMPLANT
GOWN STRL REIN XL XLG (GOWN DISPOSABLE) ×3 IMPLANT
GUIDEWIRE STR DUAL SENSOR (WIRE) ×1 IMPLANT
HOLDER FOLEY CATH W/STRAP (MISCELLANEOUS) ×1 IMPLANT
IV NS IRRIG 3000ML ARTHROMATIC (IV SOLUTION) ×8 IMPLANT
KIT ASPIRATION TUBING (SET/KITS/TRAYS/PACK) ×3 IMPLANT
MANIFOLD NEPTUNE II (INSTRUMENTS) ×3 IMPLANT
PACK CYSTO (CUSTOM PROCEDURE TRAY) ×3 IMPLANT
SHEATH ACCESS URETERAL 38CM (SHEATH) ×1 IMPLANT
SUT ETHILON 3 0 PS 1 (SUTURE) IMPLANT
SYR 30ML LL (SYRINGE) ×1 IMPLANT
SYRINGE IRR TOOMEY STRL 70CC (SYRINGE) ×1 IMPLANT
TUBING CONNECTING 10 (TUBING) ×3 IMPLANT

## 2012-12-05 NOTE — Anesthesia Postprocedure Evaluation (Signed)
Anesthesia Post Note  Patient: [name]  Procedure(s) Performed: Procedure(s) (LRB): CYSTOSCOPY WITH RIGHT URETEROSCOPY AND STONE EXTRACTION (Right) TRANSURETHRAL PROSTATECTOMY WITH GYRUS INSTRUMENTS (N/A)  Anesthesia type: General  Patient location: PACU  Post pain: Pain level controlled  Post assessment: Post-op Vital signs reviewed  Last Vitals:  Filed Vitals:   12/05/12 1245  BP: 154/79  Pulse: 52  Temp:   Resp: 13    Post vital signs: Reviewed  Level of consciousness: sedated  Complications: No apparent anesthesia complications

## 2012-12-05 NOTE — Progress Notes (Signed)
Patient ID: [name], male   DOB: 01/05/1941, 72 y.o.   MRN: [id number]  Mr.  [name] has had some right colic post ureteroscopy but that has improved and his BP is better with apresoline and is down to 164/71.   His urine has been bloody but clears on CBI.    With his large prostate and hematuria, he will probably be best served with 2 days in house with the foley.    Dr. Retta Diones will round in the morning. Scripts, instruction and f/u appt on chart.

## 2012-12-05 NOTE — Progress Notes (Signed)
Patients BP elevated upon admission from PACU systolic in 190-200's/ 80's-90's7, RN notified MD and received orders, see MAR. Will continue to monitor.

## 2012-12-05 NOTE — Anesthesia Preprocedure Evaluation (Addendum)
Anesthesia Evaluation  Patient identified by MRN, date of birth, ID band Patient awake    Reviewed: Allergy & Precautions, H&P , NPO status , Patient's Chart, lab work & pertinent test results  Airway Mallampati: II TM Distance: >3 FB Neck ROM: Full    Dental  (+) Teeth Intact and Dental Advisory Given   Pulmonary pneumonia -,  breath sounds clear to auscultation  Pulmonary exam normal       Cardiovascular negative cardio ROS  Rhythm:Regular Rate:Normal     Neuro/Psych negative neurological ROS  negative psych ROS   GI/Hepatic Neg liver ROS, hiatal hernia, GERD-  Medicated,History Nissen Fundoplication    Endo/Other  negative endocrine ROS  Renal/GU negative Renal ROS  negative genitourinary   Musculoskeletal  (+) Arthritis -, Rheumatoid disorders,    Abdominal   Peds negative pediatric ROS (+)  Hematology negative hematology ROS (+)   Anesthesia Other Findings   Reproductive/Obstetrics                          Anesthesia Physical Anesthesia Plan  ASA: II  Anesthesia Plan: General   Post-op Pain Management:    Induction: Intravenous  Airway Management Planned: LMA  Additional Equipment:   Intra-op Plan:   Post-operative Plan: Extubation in OR  Informed Consent: I have reviewed the patients History and Physical, chart, labs and discussed the procedure including the risks, benefits and alternatives for the proposed anesthesia with the patient or authorized representative who has indicated his/her understanding and acceptance.   Dental advisory given  Plan Discussed with: CRNA  Anesthesia Plan Comments:         Anesthesia Quick Evaluation

## 2012-12-05 NOTE — Brief Op Note (Signed)
12/05/2012  11:50 AM  PATIENT:  [name]  72 y.o. male  PRE-OPERATIVE DIAGNOSIS:  RIGHT UVJ STONE AND BPH WITH BLADDER OUTLET OBSTRUCTION  POST-OPERATIVE DIAGNOSIS:  RIGHT UVJ STONE AND BPH WITH BLADDER OUTLET OBSTRUCTION  PROCEDURE:  Procedure(s): CYSTOSCOPY WITH RIGHT URETEROSCOPY AND STONE EXTRACTION (Right) TRANSURETHRAL PROSTATECTOMY WITH GYRUS INSTRUMENTS (N/A)  SURGEON:  Surgeon(s) and Role:    * Anner Crete, MD - Primary  PHYSICIAN ASSISTANT:   ASSISTANTS: none   ANESTHESIA:   general  EBL:  Total I/O In: 1000 [I.V.:1000] Out: - 500  BLOOD ADMINISTERED:none  DRAINS: Urinary Catheter (Foley)   LOCAL MEDICATIONS USED:  NONE  SPECIMEN:  Source of Specimen:  right ureteral stones and prostate chips  DISPOSITION OF SPECIMEN:  stones to family.  prostate to path.   COUNTS:  YES  TOURNIQUET:  * No tourniquets in log *  DICTATION: .Other Dictation: Dictation Number [id number]  PLAN OF CARE: Admit for overnight observation  PATIENT DISPOSITION:  PACU - hemodynamically stable.   Delay start of Pharmacological VTE agent (>24hrs) due to surgical blood loss or risk of bleeding: yes

## 2012-12-05 NOTE — Transfer of Care (Signed)
Immediate Anesthesia Transfer of Care Note  Patient: [name]  Procedure(s) Performed: Procedure(s): CYSTOSCOPY WITH RIGHT URETEROSCOPY AND STONE EXTRACTION (Right) TRANSURETHRAL PROSTATECTOMY WITH GYRUS INSTRUMENTS (N/A)  Patient Location: PACU  Anesthesia Type:General  Level of Consciousness: sedated  Airway & Oxygen Therapy: Patient Spontanous Breathing and Patient connected to face mask oxygen  Post-op Assessment: Report given to PACU RN and Post -op Vital signs reviewed and stable  Post vital signs: Reviewed and stable  Complications: No apparent anesthesia complications

## 2012-12-05 NOTE — Interval H&P Note (Signed)
History and Physical Interval Note:  12/05/2012 9:53 AM  [name]  has presented today for surgery, with the diagnosis of RIGHT UVJ STONE AND BPH WITH BLADDER OUTLET OBSTRUCTION  The various methods of treatment have been discussed with the patient and family. After consideration of risks, benefits and other options for treatment, the patient has consented to  Procedure(s): CYSTOSCOPY WITH RIGHT URETEROSCOPY AND STONE EXTRACTION (Right) TRANSURETHRAL PROSTATECTOMY WITH GYRUS INSTRUMENTS (N/A) as a surgical intervention .  The patient's history has been reviewed, patient examined, no change in status, stable for surgery.  I have reviewed the patient's chart and labs.  Questions were answered to the patient's satisfaction.     [name]

## 2012-12-06 ENCOUNTER — Encounter (HOSPITAL_COMMUNITY): Payer: Self-pay | Admitting: Urology

## 2012-12-06 NOTE — Progress Notes (Signed)
1 Day Post-Op Subjective: Patient reports that he is having no pain. He has not had nausea.  Objective: Vital signs in last 24 hours: Temp:  [97.5 F (36.4 C)-98 F (36.7 C)] 98 F (36.7 C) (08/22 0537) Pulse Rate:  [51-68] 61 (08/22 0537) Resp:  [11-13] 12 (08/22 0537) BP: (116-210)/(65-88) 144/65 mmHg (08/22 0537) SpO2:  [96 %-100 %] 97 % (08/22 0537) Weight:  [82.237 kg (181 lb 4.8 oz)] 82.237 kg (181 lb 4.8 oz) (08/21 1314)  Intake/Output from previous day: 08/21 0701 - 08/22 0700 In: 40981.1 [P.O.:120; I.V.:2978.3] Out: 91478 [Urine:25600] Intake/Output this shift:    Physical Exam:  Constitutional: Vital signs reviewed. WD WN in NAD   Eyes: PERRL, No scleral icterus.    Urine is still slightly bloody but without clots. CBI is still continuing.  Lab Results: No results found for this basename: HGB, HCT,  in the last 72 hours BMET No results found for this basename: NA, K, CL, CO2, GLUCOSE, BUN, CREATININE, CALCIUM,  in the last 72 hours No results found for this basename: LABPT, INR,  in the last 72 hours No results found for this basename: LABURIN,  in the last 72 hours Results for orders placed in visit on 10/28/10  [facility] SMEAR     Status: None   Collection Time    10/28/10 10:56 AM      Result Value Range Status   Smear Result Smear Available   Final    Studies/Results: Kub Day Of Procedure  12/05/2012   *RADIOLOGY REPORT*  Clinical Data: Ureteral calculus  ABDOMEN - 1 VIEW  Comparison: October 30, 2012  Findings: There are coils throughout the abdomen.  There are clips in the upper abdomen and pelvis.  There are stable calcifications in the pelvis which may be vascular etiology.  No convincing ureteral calcifications are seen.  The bowel gas pattern is normal.  IMPRESSION: Normal bowel gas pattern.  No convincing calculi seen on this study.   Original Report Authenticated By: Bretta Bang, M.D.   Dg C-arm 1-60 Min-no Report  12/05/2012   CLINICAL DATA: intra  op   C-ARM 1-60 MINUTES  Fluoroscopy was utilized by the requesting physician.  No radiographic  interpretation.     Assessment/Plan:   Postoperative day #1 ureteroscopic stone extraction and TURP. He is doing well. He still needs his CBI. He can get out of bed and be put on a regular diet. We will saline lock his IV.   LOS: 1 day   Chelsea Aus 12/06/2012, 7:43 AM

## 2012-12-06 NOTE — Op Note (Signed)
NAME:  [name], [name]                ACCOUNT NO.:  [id number]  MEDICAL RECORD NO.:  [id number]  LOCATION:  [facility]                         FACILITY:  [facility]  PHYSICIAN:  Excell Seltzer. Annabell Howells, M.D.    DATE OF BIRTH:  1941-04-07  DATE OF PROCEDURE:  12/05/2012 DATE OF DISCHARGE:                              OPERATIVE REPORT   PROCEDURE: 1. Cystoscopy with right ureteroscopic stone extraction. 2. Transurethral resection of the prostate.  PREOPERATIVE DIAGNOSES: 1. Right distal ureteral stone. 2. Benign prostatic hypertrophy. 3. Bladder outlet obstruction.  POSTOPERATIVE DIAGNOSES: 1. Right distal ureteral stone. 2. Benign prostatic hypertrophy. 3. Bladder outlet obstruction.  SURGEON:  Excell Seltzer. Annabell Howells, M.D.  ANESTHESIA:  General.  SPECIMENS:  Two distal ureteral stones and prostate chips.  BLOOD LOSS:  Approximately 500 mL.  DRAINS:  22-French 3-way Foley catheter.  COMPLICATIONS:  None.  INDICATIONS:  [name] is a 72 year old white male with two 6-mm right distal ureteral stents and BPH, bladder outlet obstruction.  He is to undergo cystoscopy with right ureteroscopic stone extraction and TURP.  FINDINGS ON PROCEDURE:  He was given Cipro, he was taken to the operating room where general anesthetic was induced.  He was placed in lithotomy position.  He was fitted with PAS hose.  Perineum and genitalia were prepped with Betadine solution, draped in usual sterile fashion.  Cystoscopy was performed using a 22-French scope and 12-degree lens. Examination revealed a normal urethra.  The external sphincter was intact.  The prostatic urethra was approximately 4 cm in length with trilobar hyperplasia with obstruction.  The bladder wall had moderate trabeculation.  No tumors or stones were noted.  Ureteral orifices were unremarkable.  They were somewhat obscured by the middle lobe.  The right ureteral orifice was identified and cannulated with 5-French open-end catheter, a small amount  of contrast was injected to confirm the location of stones in the distal ureter.  Filling defect was seen, confirming the calcifications noted were intraureteral.  The guidewire was then passed to the kidney, and a 12-French introducer sheath inner core was used to dilate the distal ureter.  A 6.4-French short ureteroscope was then passed alongside the wire, and the stones were visualized.  The Nitinol basket was used and the stones were sequentially grasped and removed without significant resistance.  Once the stones were removed, it was felt that stenting was not indicated and proceeded with a TURP.  The 28-French continuous flow resectoscope sheath was inserted.  This was fitted with an [place] handle with a 12-degree lens, and a bipolar loop.  Saline was used as irrigant.  Resection was initiated by resecting the middle lobe of the prostate down the bladder fibers and the floor of the prostate was resected out to alongside the verumontanum.  The left lobe was resected from bladder neck to apex out to capsular fibers.  This was followed by the right lobe.  Apical and anterior tissue was resected once the initial channel was created.  At this point, the bladder was evacuated free of chips and final hemostasis was performed.  Once the hemostasis was achieved, the bladder was inspected.  There were no retained chips.  Ureteral orifices were  intact and there was no active bleeding.  The external sphincter appeared intact as well.  The scope was removed.  Pressure on the bladder produced a good stream.  A 22-French three-way Foley catheter was inserted with the aid of a catheter guide.  The balloon was filled 30 mL sterile fluid.  The catheter was irrigated with clear return and placed to continuous irrigation and straight drainage.  The patient was taken down from lithotomy position.  His anesthetic was reversed.  He was moved to recovery room in stable condition.  There were  no complications.     Excell Seltzer. Annabell Howells, M.D.     JJW/MEDQ  D:  12/05/2012  T:  12/05/2012  Job:  [id number]

## 2012-12-07 NOTE — Progress Notes (Signed)
Utilization Review completed.  

## 2012-12-07 NOTE — Discharge Summary (Signed)
Physician Discharge Summary  Patient ID: [name] MRN: [id number] DOB/AGE: [birth year] 72 y.o.  Admit date: 12/05/2012 Discharge date: 12/07/2012  Admission Diagnoses: Rt Ureteral Stone, Prostatic Hyperplasia with Urinary Obstruction  Discharge Diagnoses: Rt Ureteral Stone, Prostatic Hyperplasia with Urinary Obstruction s/p surgery  Discharged Condition: good  Hospital Course: Pt underwent right ureteroscopic stone manipulation and transurethral resection of prostate on the day of admission without acute complications. He was admitted to the 4th floor Urology service post-op where he began his uneventful recovery. He was initially managed on continuous bladder irrigation which was weaned to off. By POD 2, the day of discharge, the patient had passed a 3 cup voiding trial, has pain controlled with PO meds, was ambulatory, maintaining PO intake, and felt to be adequate for discharge.   Consults: None  Significant Diagnostic Studies: labs: surgical pathology - pending at discharge  Treatments: surgery:  right ureteroscopic stone manipulation and transurethral resection of prostate on 12/05/12  Discharge Exam: Blood pressure 152/77, pulse 59, temperature 98.2 F (36.8 C), temperature source Oral, resp. rate 12, height 5\' 7"  (1.702 m), weight 82.237 kg (181 lb 4.8 oz), SpO2 98.00%. General appearance: alert, cooperative, appears stated age and wife at bedside Head: Normocephalic, without obvious abnormality, atraumatic Eyes: conjunctivae/corneas clear. PERRL, EOM's intact. Fundi benign. Ears: normal TM's and external ear canals both ears Nose: Nares normal. Septum midline. Mucosa normal. No drainage or sinus tenderness. Throat: lips, mucosa, and tongue normal; teeth and gums normal Neck: no adenopathy, no carotid bruit, no JVD, supple, symmetrical, trachea midline and thyroid not enlarged, symmetric, no tenderness/mass/nodules Back: symmetric, no curvature. ROM normal. No CVA  tenderness. Resp: clear to auscultation bilaterally Chest wall: no tenderness Cardio: regular rate and rhythm, S1, S2 normal, no murmur, click, rub or gallop GI: soft, non-tender; bowel sounds normal; no masses,  no organomegaly Male genitalia: normal Extremities: extremities normal, atraumatic, no cyanosis or edema Pulses: 2+ and symmetric Skin: Skin color, texture, turgor normal. No rashes or lesions Lymph nodes: Cervical, supraclavicular, and axillary nodes normal. Neurologic: Grossly normal 3 glass urine inspected and found to be progressively clearing and free of clot. Pt denies sensation of residual.  Disposition: 01-Home or Self Care     Medication List    TAKE these medications       ciprofloxacin 500 MG tablet  Commonly known as:  CIPRO  Take 1 tablet (500 mg total) by mouth 2 (two) times daily.     docusate sodium 100 MG capsule  Commonly known as:  COLACE  Take 1 capsule (100 mg total) by mouth 2 (two) times daily.     hydrocortisone cream 1 %  Apply 1 application topically as needed (for rash in groin).     ICAPS Caps  Take 1 capsule by mouth daily.     leflunomide 20 MG tablet  Commonly known as:  ARAVA  Take 20 mg by mouth every morning.     omeprazole 20 MG capsule  Commonly known as:  PRILOSEC  Take 20 mg by mouth daily.     predniSONE 5 MG tablet  Commonly known as:  DELTASONE  Take 5 mg by mouth daily.     Similasan Allergy Eye Relief Soln  Apply 1 drop to eye daily as needed (for allergies).     traMADol 50 MG tablet  Commonly known as:  ULTRAM  Take 50 mg by mouth 2 (two) times daily.      ASK your doctor about these medications  acetaminophen 650 MG CR tablet  Commonly known as:  TYLENOL  Take 1,300 mg by mouth 2 (two) times daily.  Ask about: Which instructions should I use?           Follow-up Information   Follow up with Anner Crete, MD On 12/12/2012. [phone])    Specialty:  Urology   Contact information:   [address]  [address] [place] [address] [phone]       Signed: Sebastian [name] 12/07/2012, 1:10 PM

## 2013-02-03 ENCOUNTER — Inpatient Hospital Stay (HOSPITAL_COMMUNITY)
Admission: EM | Admit: 2013-02-03 | Discharge: 2013-02-09 | DRG: 378 | Disposition: A | Discharge status: Home / Self Care | Payer: Medicare Other | Attending: Internal Medicine | Admitting: Internal Medicine

## 2013-02-03 ENCOUNTER — Encounter (HOSPITAL_COMMUNITY): Payer: Self-pay | Admitting: Emergency Medicine

## 2013-02-03 DIAGNOSIS — M069 Rheumatoid arthritis, unspecified: Secondary | ICD-10-CM

## 2013-02-03 DIAGNOSIS — K625 Hemorrhage of anus and rectum: Secondary | ICD-10-CM

## 2013-02-03 DIAGNOSIS — K922 Gastrointestinal hemorrhage, unspecified: Secondary | ICD-10-CM

## 2013-02-03 DIAGNOSIS — N390 Urinary tract infection, site not specified: Secondary | ICD-10-CM

## 2013-02-03 DIAGNOSIS — Z96659 Presence of unspecified artificial knee joint: Secondary | ICD-10-CM

## 2013-02-03 DIAGNOSIS — N400 Enlarged prostate without lower urinary tract symptoms: Secondary | ICD-10-CM | POA: Diagnosis present

## 2013-02-03 DIAGNOSIS — M810 Age-related osteoporosis without current pathological fracture: Secondary | ICD-10-CM | POA: Diagnosis present

## 2013-02-03 DIAGNOSIS — M1990 Unspecified osteoarthritis, unspecified site: Secondary | ICD-10-CM | POA: Diagnosis present

## 2013-02-03 DIAGNOSIS — D62 Acute posthemorrhagic anemia: Secondary | ICD-10-CM | POA: Diagnosis present

## 2013-02-03 DIAGNOSIS — K219 Gastro-esophageal reflux disease without esophagitis: Secondary | ICD-10-CM | POA: Diagnosis present

## 2013-02-03 DIAGNOSIS — D509 Iron deficiency anemia, unspecified: Secondary | ICD-10-CM

## 2013-02-03 DIAGNOSIS — E46 Unspecified protein-calorie malnutrition: Secondary | ICD-10-CM | POA: Diagnosis present

## 2013-02-03 DIAGNOSIS — K5731 Diverticulosis of large intestine without perforation or abscess with bleeding: Principal | ICD-10-CM

## 2013-02-03 DIAGNOSIS — IMO0002 Reserved for concepts with insufficient information to code with codable children: Secondary | ICD-10-CM

## 2013-02-03 LAB — URINALYSIS, ROUTINE W REFLEX MICROSCOPIC
Ketones, ur: NEGATIVE mg/dL
Protein, ur: 100 mg/dL — AB
Urobilinogen, UA: 0.2 mg/dL (ref 0.0–1.0)

## 2013-02-03 LAB — CBC WITH DIFFERENTIAL/PLATELET
Basophils Relative: 1 % (ref 0–1)
HCT: 34.6 % — ABNORMAL LOW (ref 39.0–52.0)
Hemoglobin: 10.7 g/dL — ABNORMAL LOW (ref 13.0–17.0)
Lymphocytes Relative: 18 % (ref 12–46)
Lymphs Abs: 1.8 10*3/uL (ref 0.7–4.0)
MCHC: 30.9 g/dL (ref 30.0–36.0)
Monocytes Absolute: 1 10*3/uL (ref 0.1–1.0)
Monocytes Relative: 10 % (ref 3–12)
Neutro Abs: 6.9 10*3/uL (ref 1.7–7.7)
RBC: 4.34 MIL/uL (ref 4.22–5.81)

## 2013-02-03 LAB — COMPREHENSIVE METABOLIC PANEL
Alkaline Phosphatase: 69 U/L (ref 39–117)
BUN: 13 mg/dL (ref 6–23)
CO2: 24 mEq/L (ref 19–32)
Chloride: 104 mEq/L (ref 96–112)
Creatinine, Ser: 0.98 mg/dL (ref 0.50–1.35)
GFR calc non Af Amer: 81 mL/min — ABNORMAL LOW (ref >90)
Glucose, Bld: 125 mg/dL — ABNORMAL HIGH (ref 70–99)
Total Bilirubin: 0.5 mg/dL (ref 0.3–1.2)

## 2013-02-03 LAB — URINE MICROSCOPIC-ADD ON

## 2013-02-03 LAB — CBC
HCT: 28.5 % — ABNORMAL LOW (ref 39.0–52.0)
Hemoglobin: 9.7 g/dL — ABNORMAL LOW (ref 13.0–17.0)
Hemoglobin: 8.9 g/dL — ABNORMAL LOW (ref 13.0–17.0)
MCH: 25.4 pg — ABNORMAL LOW (ref 26.0–34.0)
Platelets: 196 10*3/uL (ref 150–400)
RBC: 3.82 MIL/uL — ABNORMAL LOW (ref 4.22–5.81)
RBC: 3.59 MIL/uL — ABNORMAL LOW (ref 4.22–5.81)
WBC: 8.9 10*3/uL (ref 4.0–10.5)
WBC: 7.5 10*3/uL (ref 4.0–10.5)

## 2013-02-03 MED ORDER — SODIUM CHLORIDE 0.9 % IV SOLN
INTRAVENOUS | Status: DC
  Administered 2013-02-03: 125 mL via INTRAVENOUS
  Administered 2013-02-03: 125 mL/h via INTRAVENOUS
  Administered 2013-02-04: 06:00:00 via INTRAVENOUS

## 2013-02-03 MED ORDER — PANTOPRAZOLE SODIUM 40 MG PO TBEC
40.0000 mg | DELAYED_RELEASE_TABLET | Freq: Every day | ORAL | Status: DC
  Administered 2013-02-04 – 2013-02-09 (×6): 40 mg via ORAL
  Filled 2013-02-03 (×6): qty 1

## 2013-02-03 MED ORDER — ONDANSETRON HCL 4 MG/2ML IJ SOLN: 4.0000 mg | Freq: Four times a day (QID) | INTRAMUSCULAR | Status: DC | PRN

## 2013-02-03 MED ORDER — PREDNISONE 10 MG PO TABS
10.0000 mg | ORAL_TABLET | Freq: Every day | ORAL | Status: DC
  Administered 2013-02-04: 5 mg via ORAL
  Filled 2013-02-03: qty 1

## 2013-02-03 MED ORDER — ALBUTEROL SULFATE (5 MG/ML) 0.5% IN NEBU: 2.5000 mg | INHALATION_SOLUTION | RESPIRATORY_TRACT | Status: DC | PRN

## 2013-02-03 MED ORDER — ACETAMINOPHEN 500 MG PO TABS
1000.0000 mg | ORAL_TABLET | Freq: Two times a day (BID) | ORAL | Status: DC
  Administered 2013-02-03 – 2013-02-09 (×12): 1000 mg via ORAL
  Filled 2013-02-03 (×15): qty 2

## 2013-02-03 MED ORDER — SODIUM CHLORIDE 0.9 % IV BOLUS (SEPSIS)
500.0000 mL | Freq: Once | INTRAVENOUS | Status: AC
  Administered 2013-02-03: 500 mL via INTRAVENOUS

## 2013-02-03 MED ORDER — LEFLUNOMIDE 20 MG PO TABS
20.0000 mg | ORAL_TABLET | Freq: Every morning | ORAL | Status: DC
  Administered 2013-02-04 – 2013-02-09 (×6): 20 mg via ORAL
  Filled 2013-02-03 (×6): qty 1

## 2013-02-03 MED ORDER — OCUVITE-LUTEIN PO CAPS
1.0000 | ORAL_CAPSULE | Freq: Every day | ORAL | Status: DC
  Administered 2013-02-04 – 2013-02-09 (×6): 1 via ORAL
  Filled 2013-02-03 (×6): qty 1

## 2013-02-03 MED ORDER — CIPROFLOXACIN IN D5W 400 MG/200ML IV SOLN
400.0000 mg | Freq: Two times a day (BID) | INTRAVENOUS | Status: DC
  Administered 2013-02-03 – 2013-02-04 (×2): 400 mg via INTRAVENOUS
  Filled 2013-02-03 (×3): qty 200

## 2013-02-03 MED ORDER — SODIUM CHLORIDE 0.9 % IJ SOLN
3.0000 mL | Freq: Two times a day (BID) | INTRAMUSCULAR | Status: DC
  Administered 2013-02-05 – 2013-02-08 (×7): 3 mL via INTRAVENOUS

## 2013-02-03 MED ORDER — SIMILASAN ALLERGY EYE RELIEF OP SOLN: 1.0000 [drp] | Freq: Every day | OPHTHALMIC | Status: DC | PRN

## 2013-02-03 MED ORDER — ONDANSETRON HCL 4 MG PO TABS: 4.0000 mg | ORAL_TABLET | Freq: Four times a day (QID) | ORAL | Status: DC | PRN

## 2013-02-03 MED ORDER — TRAMADOL HCL 50 MG PO TABS
50.0000 mg | ORAL_TABLET | Freq: Two times a day (BID) | ORAL | Status: DC
  Administered 2013-02-03 – 2013-02-09 (×12): 50 mg via ORAL
  Filled 2013-02-03 (×12): qty 1

## 2013-02-03 NOTE — ED Notes (Signed)
Spoke with EDP stated seen clotted blood dark red from patient stool sample from Nurse tech. Visibly bloody. Cancelled stool occult.

## 2013-02-03 NOTE — ED Provider Notes (Signed)
CSN: [id number]     Arrival date & time 02/03/13  0915 History   First MD Initiated Contact with Patient 02/03/13 [phone]     Chief Complaint  Patient presents with  . Rectal Bleeding   (Consider location/radiation/quality/duration/timing/severity/associated sxs/prior Treatment) HPI Comments: [name] is a 72 y.o. male who reports frequent episodes of rectal bleeding, this morning, dark, red in color. He has mild, associated weakness. He last had an episode of rectal bleeding, January 2014. He's had multiple episodes of rectal bleeding, associated with "broken blood vessel." He denies chest pain, shortness of breath, or back pain. No recent fever, or nausea or vomiting. There are no other known modifying factors.   Patient is a 72 y.o. male presenting with hematochezia. The history is provided by the patient.  Rectal Bleeding   Past Medical History  Diagnosis Date  . Bursitis     chronic hip pain  . BPH (benign prostatic hyperplasia)   . Blood transfusion     " no reaction from transfusion"  . GERD (gastroesophageal reflux disease)   . H/O hiatal hernia   . Spondylisthesis   . Osteoporosis   . Bruises easily   . Diverticulitis   . Pneumonia   . Rash     GROIN - ITCHING PAST 3 MONTHS  . History of kidney stones   . History of GI bleed     "vessels burst in colon" x2  . Rheumatoid arthritis(714.0)     27 years  . Osteoarthritis   . Disc disease, degenerative, lumbar or lumbosacral     Chronic back pain now  . History of vertigo    Past Surgical History  Procedure Laterality Date  . Total knee arthroplasty  2003    Bilateral  . Nissen fundoplication      about 2005, pt not sure records not currently available  . Lithotripsy for nephrolithiasis      Pt and wife not sure when but here in [place]  . Lysis of adhesion, small bowel resection  2009  . Hernia repair    . Inguinal hernia repair  2007    Bilateral   . Ventral hernia repair with mesh    .  Esophagogastroduodenoscopy N/A 10/01/2012    Procedure: ESOPHAGOGASTRODUODENOSCOPY (EGD);  Surgeon: Charolett Bumpers, MD;  Location: [facility];  Service: Endoscopy;  Laterality: N/A;  . Balloon dilation N/A 10/01/2012    Procedure: BALLOON DILATION;  Surgeon: Charolett Bumpers, MD;  Location: [facility];  Service: Endoscopy;  Laterality: N/A;  . Cystoscopy with ureteroscopy Right 12/05/2012    Procedure: CYSTOSCOPY WITH RIGHT URETEROSCOPY AND STONE EXTRACTION;  Surgeon: Anner Crete, MD;  Location: [facility];  Service: Urology;  Laterality: Right;  . Transurethral prostatectomy with gyrus instruments N/A 12/05/2012    Procedure: TRANSURETHRAL PROSTATECTOMY WITH GYRUS INSTRUMENTS;  Surgeon: Anner Crete, MD;  Location: [facility];  Service: Urology;  Laterality: N/A;   No family history on file. History  Substance Use Topics  . Smoking status: Never Smoker   . Smokeless tobacco: Never Used  . Alcohol Use: No    Review of Systems  Gastrointestinal: Positive for hematochezia.  All other systems reviewed and are negative.    Allergies  Clindamycin/lincomycin; Codeine; and Penicillins  Home Medications   Current Outpatient Rx  Name  Route  Sig  Dispense  Refill  . acetaminophen (TYLENOL) 650 MG CR tablet   Oral   Take 1,300 mg by mouth 2 (two) times daily.         Marland Kitchen  Homeopathic Products [facility] ALLERGY EYE RELIEF) SOLN   Ophthalmic   Apply 1 drop to eye daily as needed (for allergies).         . hydrocortisone cream 1 %   Topical   Apply 1 application topically as needed (for rash in groin).         Marland Kitchen leflunomide (ARAVA) 20 MG tablet   Oral   Take 20 mg by mouth every morning.          . Multiple Vitamins-Minerals (ICAPS) CAPS   Oral   Take 1 capsule by mouth daily.         Marland Kitchen omeprazole (PRILOSEC) 20 MG capsule   Oral   Take 20 mg by mouth daily.           . predniSONE (DELTASONE) 5 MG tablet   Oral   Take 5 mg by mouth daily.         . traMADol (ULTRAM)  50 MG tablet   Oral   Take 50 mg by mouth 2 (two) times daily.          BP 130/75  Pulse 59  Temp(Src) 97.7 F (36.5 C) (Oral)  Resp 16  Ht 5' 7.5" (1.715 m)  Wt 178 lb (80.74 kg)  BMI 27.45 kg/m2  SpO2 100% Physical Exam  Nursing note and vitals reviewed. Constitutional: He is oriented to person, place, and time. He appears well-developed and well-nourished.  HENT:  Head: Normocephalic and atraumatic.  Right Ear: External ear normal.  Left Ear: External ear normal.  Eyes: Conjunctivae and EOM are normal. Pupils are equal, round, and reactive to light.  Neck: Normal range of motion and phonation normal. Neck supple.  Cardiovascular: Normal rate, regular rhythm, normal heart sounds and intact distal pulses.   Pulmonary/Chest: Effort normal and breath sounds normal. He exhibits no bony tenderness.  Abdominal: Soft. Normal appearance. There is no tenderness.  Musculoskeletal: Normal range of motion.  Neurological: He is alert and oriented to person, place, and time. No cranial nerve deficit or sensory deficit. He exhibits normal muscle tone. Coordination normal.  Skin: Skin is warm, dry and intact.  Psychiatric: He has a normal mood and affect. His behavior is normal. Judgment and thought content normal.    ED Course  Procedures (including critical care time) Medications  0.9 %  sodium chloride infusion (125 mL/hr Intravenous New Bag/Given 02/03/13 1059)  sodium chloride 0.9 % bolus 500 mL (0 mLs Intravenous Stopped 02/03/13 1059)    Patient Vitals for the past 24 hrs:  BP Temp Temp src Pulse Resp SpO2 Height Weight  02/03/13 1445 130/75 mmHg - - 59 16 100 % - -  02/03/13 1330 126/64 mmHg - - - 12 - - -  02/03/13 1245 121/71 mmHg - - 54 13 98 % - -  02/03/13 1215 116/73 mmHg - - 58 16 98 % - -  02/03/13 1145 119/69 mmHg - - - 16 98 % - -  02/03/13 1130 120/66 mmHg - - - 15 98 % - -  02/03/13 1115 122/63 mmHg - - - 16 98 % - -  02/03/13 1045 121/60 mmHg - - - 16 98 % - -   02/03/13 1000 107/70 mmHg - - 60 16 100 % - -  02/03/13 0936 108/71 mmHg - - 80 - 97 % - -  02/03/13 0935 128/84 mmHg - - 65 - 94 % - -  02/03/13 0934 149/82 mmHg - - 62 18 - - -  02/03/13 0922 133/89 mmHg 97.7 F (36.5 C) Oral 79 18 97 % 5' 7.5" (1.715 m) 178 lb (80.74 kg)     Consultation: Discussed with Dr. Laural Benes his primary gastroenterologist to assess a call, his on-call partner. I discussed the case with Dr. Dulce Sellar, who will see the patient in the hospital  Consultation: Discussed with the on-call [facility] hospitalist, who will admit the patient.   Holding orders for admission written   CRITICAL CARE Performed by: Flint Melter Total critical care time: 35 minutes Critical care time was exclusive of separately billable procedures and treating other patients. Critical care was necessary to treat or prevent imminent or life-threatening deterioration. Critical care was time spent personally by me on the following activities: development of treatment plan with patient and/or surrogate as well as nursing, discussions with consultants, evaluation of patient's response to treatment, examination of patient, obtaining history from patient or surrogate, ordering and performing treatments and interventions, ordering and review of laboratory studies, ordering and review of radiographic studies, pulse oximetry and re-evaluation of patient's condition.  Labs Review Labs Reviewed  CBC WITH DIFFERENTIAL - Abnormal; Notable for the following:    Hemoglobin 10.7 (*)    HCT 34.6 (*)    MCH 24.7 (*)    All other components within normal limits  COMPREHENSIVE METABOLIC PANEL - Abnormal; Notable for the following:    Glucose, Bld 125 (*)    Albumin 3.2 (*)    GFR calc non Af Amer 81 (*)    All other components within normal limits  URINALYSIS, ROUTINE W REFLEX MICROSCOPIC - Abnormal; Notable for the following:    APPearance CLOUDY (*)    Specific Gravity, Urine 1.031 (*)    Hgb urine dipstick  SMALL (*)    Bilirubin Urine SMALL (*)    Protein, ur 100 (*)    Leukocytes, UA MODERATE (*)    All other components within normal limits  URINE MICROSCOPIC-ADD ON - Abnormal; Notable for the following:    Bacteria, UA FEW (*)    Casts HYALINE CASTS (*)    All other components within normal limits  URINE CULTURE  CBC  TYPE AND SCREEN   Imaging Review No results found.  EKG Interpretation   None       MDM   1. Rectal bleeding   2. UTI (lower urinary tract infection)    Rectal bleeding, likely diverticular, with drop in hemoglobin, from baseline. He has remained hemodynamically stable in the emergency department. He will need to be admitted for treatment, and observation.   Nursing Notes Reviewed/ Care Coordinated, and agree without changes. Applicable Imaging Reviewed.  Interpretation of Laboratory Data incorporated into ED treatment   Plan: Admit    Flint Melter, MD 02/03/13 [phone]

## 2013-02-03 NOTE — ED Notes (Signed)
Patient rolled to bathroom on stedy, patient somewhat dizzy, and returned on stedy.

## 2013-02-03 NOTE — ED Notes (Signed)
Pt rolled to bathroom on stedy and returned.  Loose bloody stool in toliet.

## 2013-02-03 NOTE — H&P (Signed)
[facility] History and Physical  [name] GEX:[id number] DOB: Jul 16, 1940 DOA: 02/03/2013  Referring physician: Dr. Mancel Bale, EDP PCP: Ginette Otto, MD  Outpatient Specialists:  1. GI: Dr. Danise Edge. 2. Rheumatology: Dr. Zenovia Beveridge  Chief Complaint: Rectal bleeding  HPI: [name] is a 72 y.o. male with prior history of rectal bleeding (2012: Apparently was transfused 8 units PRBCs and had to have "coiling" to stop bleeding and in 2013), not on blood thinners, rheumatoid arthritis on chronic prednisone, GERD presented to the ED on 02/03/13 with several episodes of bright red bleeding per rectum. At approximately 5:30 AM on 02/03/13, he felt a growling in the abdomen followed by first episode of bright red bleeding with clots. Following this he had a 8 more episodes of rectal bleeding before he came to the ED. This consisted of all bright red blood, some clots and no significant stool. He denies abdominal pain, nausea or vomiting. After the first couple of episodes, he's been feeling "woozy" but no sweating or LOC. Denies rectal pain. No NSAID use. In the ED, initial hemoglobin was 10.7 (11/28/12:12.4) which dropped to 9.7 in 4 hours. [facility] consulted by EDP. Hospitalist admission requested.   Review of Systems: All systems reviewed and apart from history of presenting illness, are negative. Gives history of chronic aches and pains related to his arthritis.  Past Medical History  Diagnosis Date  . Bursitis     chronic hip pain  . BPH (benign prostatic hyperplasia)   . Blood transfusion     " no reaction from transfusion"  . GERD (gastroesophageal reflux disease)   . H/O hiatal hernia   . Spondylisthesis   . Osteoporosis   . Bruises easily   . Diverticulitis   . Pneumonia   . Rash     GROIN - ITCHING PAST 3 MONTHS  . History of kidney stones   . History of GI bleed     "vessels burst in colon" x2  . Rheumatoid arthritis(714.0)     27 years   . Osteoarthritis   . Disc disease, degenerative, lumbar or lumbosacral     Chronic back pain now  . History of vertigo    Past Surgical History  Procedure Laterality Date  . Total knee arthroplasty  2003    Bilateral  . Nissen fundoplication      about 2005, pt not sure records not currently available  . Lithotripsy for nephrolithiasis      Pt and wife not sure when but here in [place]  . Lysis of adhesion, small bowel resection  2009  . Hernia repair    . Inguinal hernia repair  2007    Bilateral   . Ventral hernia repair with mesh    . Esophagogastroduodenoscopy N/A 10/01/2012    Procedure: ESOPHAGOGASTRODUODENOSCOPY (EGD);  Surgeon: Charolett Bumpers, MD;  Location: [facility];  Service: Endoscopy;  Laterality: N/A;  . Balloon dilation N/A 10/01/2012    Procedure: BALLOON DILATION;  Surgeon: Charolett Bumpers, MD;  Location: [facility];  Service: Endoscopy;  Laterality: N/A;  . Cystoscopy with ureteroscopy Right 12/05/2012    Procedure: CYSTOSCOPY WITH RIGHT URETEROSCOPY AND STONE EXTRACTION;  Surgeon: Anner Crete, MD;  Location: [facility];  Service: Urology;  Laterality: Right;  . Transurethral prostatectomy with gyrus instruments N/A 12/05/2012    Procedure: TRANSURETHRAL PROSTATECTOMY WITH GYRUS INSTRUMENTS;  Surgeon: Anner Crete, MD;  Location: [facility];  Service: Urology;  Laterality: N/A;   Social History:  reports that he has never smoked. He has never used smokeless tobacco. He reports that he does not drink alcohol or use illicit drugs. Married. Independent of activities of daily living.  Allergies  Allergen Reactions  . Clindamycin/Lincomycin Hives and Swelling  . Codeine Hives and Swelling  . Penicillins Hives and Swelling    History reviewed. No pertinent family history. Denies significant family history.  Prior to Admission medications   Medication Sig Start Date End Date Taking? Authorizing Provider  acetaminophen (TYLENOL) 650 MG CR tablet Take 1,300 mg by  mouth 2 (two) times daily.   Yes Historical Provider, MD  Homeopathic Products [facility] ALLERGY EYE RELIEF) SOLN Apply 1 drop to eye daily as needed (for allergies).   Yes Historical Provider, MD  hydrocortisone cream 1 % Apply 1 application topically as needed (for rash in groin).   Yes Historical Provider, MD  leflunomide (ARAVA) 20 MG tablet Take 20 mg by mouth every morning.    Yes Historical Provider, MD  Multiple Vitamins-Minerals (ICAPS) CAPS Take 1 capsule by mouth daily.   Yes Historical Provider, MD  omeprazole (PRILOSEC) 20 MG capsule Take 20 mg by mouth daily.     Yes Historical Provider, MD  predniSONE (DELTASONE) 5 MG tablet Take 5 mg by mouth daily.   Yes Historical Provider, MD  traMADol (ULTRAM) 50 MG tablet Take 50 mg by mouth 2 (two) times daily.   Yes Historical Provider, MD   Physical Exam: Filed Vitals:   02/03/13 1215 02/03/13 1245 02/03/13 1330 02/03/13 1445  BP: 116/73 121/71 126/64 130/75  Pulse: 58 54  59  Temp:      TempSrc:      Resp: 16 13 12 16   Height:      Weight:      SpO2: 98% 98%  100%     General exam: Moderately built and nourished male patient, lying comfortably supine on the gurney in no obvious distress.  Head, eyes and ENT: Nontraumatic and normocephalic. Pupils equally reacting to light and accommodation. Oral mucosa moist.  Neck: Supple. No JVD, carotid bruit or thyromegaly.  Lymphatics: No lymphadenopathy.  Respiratory system: Clear to auscultation. No increased work of breathing.  Cardiovascular system: S1 and S2 heard, RRR. No JVD, murmurs, gallops, clicks or pedal edema.  Gastrointestinal system: Abdomen is nondistended, soft and nontender. Midline laparotomy scar. Normal bowel sounds heard. No organomegaly or masses appreciated.  Central nervous system: Alert and oriented. No focal neurological deficits.  Extremities: Symmetric 5 x 5 power. Peripheral pulses symmetrically felt.   Skin: No rashes or acute  findings.  Musculoskeletal system: Boggy swelling of metacarpophalangeal joints without acute findings.  Psychiatry: Pleasant and cooperative.   Labs on Admission:  Basic Metabolic Panel:  Recent Labs Lab 02/03/13 0945  NA 138  K 3.5  CL 104  CO2 24  GLUCOSE 125*  BUN 13  CREATININE 0.98  CALCIUM 8.8   Liver Function Tests:  Recent Labs Lab 02/03/13 0945  AST 17  ALT 10  ALKPHOS 69  BILITOT 0.5  PROT 7.2  ALBUMIN 3.2*   No results found for this basename: LIPASE, AMYLASE,  in the last 168 hours No results found for this basename: AMMONIA,  in the last 168 hours CBC:  Recent Labs Lab 02/03/13 0945 02/03/13 1434  WBC 10.3 8.9  NEUTROABS 6.9  --   HGB 10.7* 9.7*  HCT 34.6* 30.3*  MCV 79.7 79.3  PLT 242 196   Cardiac Enzymes: No results found for this  basename: CKTOTAL, CKMB, CKMBINDEX, TROPONINI,  in the last 168 hours  BNP (last 3 results) No results found for this basename: PROBNP,  in the last 8760 hours CBG: No results found for this basename: GLUCAP,  in the last 168 hours  Radiological Exams on Admission: No results found.   Assessment/Plan Principal Problem:   Lower GI bleed Active Problems:   Rheumatoid arthritis(714.0)   Acute post-hemorrhagic anemia   UTI (lower urinary tract infection)   Acute lower GI bleeding - DD: Diverticular, AVM versus other etiologies - Admit to step down unit for close monitoring. - Place on clear liquid diet. - GI consulted and input pending.  Acute blood loss anemia - Secondary to GI bleed - Place 2 large bore peripheral IV access is - Monitor CBCs closely and transfuse if hemoglobin less than 8 g per DL  Possible UTI/BPH history - IV Cipro pending urine culture results   Rheumatoid arthritis/chronic prednisone - Will double the dose of prednisone for stressful situation.  History of GERD - Continue PPIs  Code Status: Full  Family Communication: Discussed with spouse at bedside.  Disposition  Plan: Home when medically stable.   Time spent: 55 minutes  Jettie Lazare, MD, FACP, FHM. [facility] Pager [phone]  If 7PM-7AM, please contact night-coverage [email or web address] Password [email or web address] 02/03/2013, 3:55 PM

## 2013-02-03 NOTE — ED Notes (Signed)
Pt reports blood in stool noted since 0530 today, pt reports having 9 episodes of seeing blood in stool. Pt reports nausea and dizziness.

## 2013-02-03 NOTE — ED Notes (Signed)
EDP spoke with patient who stated does not want rectal exam will wait for bowel movement.

## 2013-02-04 DIAGNOSIS — K5731 Diverticulosis of large intestine without perforation or abscess with bleeding: Principal | ICD-10-CM

## 2013-02-04 LAB — URINE CULTURE

## 2013-02-04 LAB — BASIC METABOLIC PANEL
BUN: 15 mg/dL (ref 6–23)
CO2: 25 mEq/L (ref 19–32)
Calcium: 7.7 mg/dL — ABNORMAL LOW (ref 8.4–10.5)
Creatinine, Ser: 0.92 mg/dL (ref 0.50–1.35)
GFR calc non Af Amer: 83 mL/min — ABNORMAL LOW (ref >90)
Glucose, Bld: 90 mg/dL (ref 70–99)

## 2013-02-04 LAB — CBC
HCT: 25.5 % — ABNORMAL LOW (ref 39.0–52.0)
Hemoglobin: 7.8 g/dL — ABNORMAL LOW (ref 13.0–17.0)
MCH: 24.6 pg — ABNORMAL LOW (ref 26.0–34.0)
MCV: 80.4 fL (ref 78.0–100.0)
RBC: 3.17 MIL/uL — ABNORMAL LOW (ref 4.22–5.81)

## 2013-02-04 MED ORDER — PREDNISONE 5 MG PO TABS
5.0000 mg | ORAL_TABLET | Freq: Every day | ORAL | Status: DC
  Administered 2013-02-05 – 2013-02-09 (×5): 5 mg via ORAL
  Filled 2013-02-04 (×5): qty 1

## 2013-02-04 NOTE — Progress Notes (Signed)
Last hgb=7.8. NP/PA paged with this information.

## 2013-02-04 NOTE — Progress Notes (Signed)
Utilization Review Completed.  

## 2013-02-04 NOTE — Progress Notes (Signed)
[facility] Progress Note [facility] TEAM 1 - Stepdown ICU Team   [name] ZOX:[id number] DOB: 1940-06-17 DOA: 02/03/2013 PCP: Ginette Otto, MD  Brief narrative: 72 y.o. male admitted for hematochezia. Patient has no abdominal pain. This is his third episode of hematochezia since April 2012. Had colonoscopy for bleeding in April 2012 by Dr. 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 In addition he had a UA that appeared consitent with a UTI    Assessment/Plan: Active Problems:   Lower GI bleed/knownDiverticulosis of colon with hemorrhage -appreciate GI assistance -no further bleeding and no abdominal pain -no plans to pursue endoscopy at this time since not actively bleeding -if recurrent bleeding then need to pursue NM Bleeding scan -GI advancing diet    Acute post-hemorrhagic anemia -hgb has drifted down to 7.8 from 10.7 so will transfuse 2 units PRBCs and check CBC after done and again in am    ?? UTI (lower urinary tract infection) -urine cx no growth so will dc anbx's    Rheumatoid arthritis(714.0)/ Long-term current use of steroids -clarified home dose Prednisone as 5 mg daily -cont Arava   DVT prophylaxis: SCDs Code Status: Full Family Communication: Patient Disposition Plan/Expected LOS: Stepdown Isolation: None Nutritional Status: Acute protein calorie malnutrition related to acute lower GI bleeding  Consultants: Gastroenterology  Procedures: None  Antibiotics: Cipro 10/20 >>> 10/21  HPI/Subjective: Patient alert. No complaints. No abdominal pain or further bleeding. No shortness of breath no chest pain. No dizziness.  Objective: Blood pressure 122/66, pulse 57, temperature 98 F (36.7 C), temperature source Oral, resp. rate 19, height 5' 7.5" (1.715 m), weight 80.74 kg (178 lb), SpO2 99.00%.  Intake/Output  Summary (Last 24 hours) at 02/04/13 1211 Last data filed at 02/04/13 1100  Gross per 24 hour  Intake 2555.83 ml  Output    215 ml  Net 2340.83 ml     Exam: General: No acute respiratory distress Lungs: Clear to auscultation bilaterally without wheezes or crackles, RA Cardiovascular: Regular rate and rhythm without murmur gallop or rub normal S1 and S2, no peripheral edema or JVD Abdomen: Nontender, nondistended, soft, bowel sounds positive, no rebound, no ascites, no appreciable mass Musculoskeletal: No significant cyanosis, clubbing of bilateral lower extremities Neurological: Alert and oriented x 3, moves all extremities x 4 without focal neurological deficits, CN 2-12 intact  Scheduled Meds:  Scheduled Meds: . acetaminophen  1,000 mg Oral BID  . ciprofloxacin  400 mg Intravenous Q12H  . leflunomide  20 mg Oral q morning - 10a  . multivitamin-lutein  1 capsule Oral Daily  . pantoprazole  40 mg Oral Daily  . predniSONE  10 mg Oral Daily  . sodium chloride  3 mL Intravenous Q12H  . traMADol  50 mg Oral BID   Continuous Infusions: . sodium chloride Stopped (02/04/13 0943)    **Reviewed in detail by the Attending Physician  Data Reviewed: Basic Metabolic Panel:  Recent Labs Lab 02/03/13 0945 02/04/13 0245  NA 138 142  K 3.5 4.0  CL 104 112  CO2 24 25  GLUCOSE 125* 90  BUN 13 15  CREATININE 0.98 0.92  CALCIUM 8.8 7.7*   Liver Function Tests:  Recent Labs Lab 02/03/13 0945  AST 17  ALT 10  ALKPHOS 69  BILITOT 0.5  PROT 7.2  ALBUMIN 3.2*   No results found for this basename: LIPASE, AMYLASE,  in the last 168 hours  No results found for this basename: AMMONIA,  in the last 168 hours CBC:  Recent Labs Lab 02/03/13 0945 02/03/13 1434 02/03/13 2015 02/04/13 0245  WBC 10.3 8.9 7.5 6.5  NEUTROABS 6.9  --   --   --   HGB 10.7* 9.7* 8.9* 7.8*  HCT 34.6* 30.3* 28.5* 25.5*  MCV 79.7 79.3 79.4 80.4  PLT 242 196 201 184   Cardiac Enzymes: No results found  for this basename: CKTOTAL, CKMB, CKMBINDEX, TROPONINI,  in the last 168 hours BNP (last 3 results) No results found for this basename: PROBNP,  in the last 8760 hours CBG: No results found for this basename: GLUCAP,  in the last 168 hours  Recent Results (from the past 240 hour(s))  URINE CULTURE     Status: None   Collection Time    02/03/13 11:02 AM      Result Value Range Status   Specimen Description URINE, RANDOM   Final   Special Requests NONE   Final   Culture  Setup Time     Final   Value: 02/03/2013 12:24     Performed at [facility] Count     Final   Value: NO GROWTH     Performed at [facility]   Culture     Final   Value: NO GROWTH     Performed at [facility]   Report Status 02/04/2013 FINAL   Final  MRSA PCR SCREENING     Status: None   Collection Time    02/03/13  5:29 PM      Result Value Range Status   MRSA by PCR NEGATIVE  NEGATIVE Final   Comment:            The GeneXpert MRSA Assay (FDA     approved for NASAL specimens     only), is one component of a     comprehensive MRSA colonization     surveillance program. It is not     intended to diagnose MRSA     infection nor to guide or     monitor treatment for     MRSA infections.     Studies:  Recent x-ray studies have been reviewed in detail by the Attending Physician     Junious Silk, ANP [facility] Office  [phone] Pager [phone]  **If unable to reach the above provider after paging please contact the Flow Manager @ [phone]  On-Call/Text Page:      [email or web address]      password [email or web address]  If 7PM-7AM, please contact night-coverage [email or web address] Password [facility] 02/04/2013, 12:11 PM   LOS: 1 day   I have examined the patient, reviewed the chart and modified the above note which I agree with.   Gwendolin Briel,MD [phone] 02/04/2013, 4:21 PM

## 2013-02-04 NOTE — Progress Notes (Signed)
Nutrition Brief Note  Patient identified on the Malnutrition Screening Tool (MST) Report  Wt Readings from Last 15 Encounters:  02/03/13 178 lb (80.74 kg)  12/05/12 181 lb 4.8 oz (82.237 kg)  12/05/12 181 lb 4.8 oz (82.237 kg)  11/28/12 177 lb 8 oz (80.513 kg)  10/01/12 177 lb (80.287 kg)  10/01/12 177 lb (80.287 kg)  08/29/12 177 lb (80.287 kg)  08/29/12 177 lb (80.287 kg)  05/19/11 182 lb 9.6 oz (82.827 kg)  04/19/11 174 lb 9.6 oz (79.198 kg)    Body mass index is 27.45 kg/(m^2). Patient meets criteria for overweight based on current BMI.   Current diet order is Full Liquids, patient is consuming approximately 75% of meals at this time. Labs and medications reviewed.   Patient reports good appetite, feels ready for diet advancement. No abdominal pain or bleeding. Plan to discharge in the next day or two if no rebleeding.   No nutrition interventions warranted at this time. If nutrition issues arise, please consult RD.   Linnell Fulling, RD, LDN Pager #: [phone] After-Hours Pager #: [phone]

## 2013-02-04 NOTE — Consult Note (Signed)
[facility] Gastroenterology Consultation Note  Referring Provider: Dr. Marcellus Scott [facility]) Primary Care Physician:  Ginette Otto, MD Primary Gastroenterologist:  Dr. Danise Edge  Reason for Consultation:  Blood in stool  HPI: [name] is a 72 y.o. male admitted for hematochezia.  Patient has no abdominal pain.  This is his third episode of hematochezia since April 2012.  Had colonoscopy for bleeding in April 2012 by Dr. 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  Since admission, his bleeding has stopped.     Past Medical History  Diagnosis Date  . Bursitis     chronic hip pain  . BPH (benign prostatic hyperplasia)   . Blood transfusion     " no reaction from transfusion"  . GERD (gastroesophageal reflux disease)   . H/O hiatal hernia   . Spondylisthesis   . Osteoporosis   . Bruises easily   . Diverticulitis   . Pneumonia   . Rash     GROIN - ITCHING PAST 3 MONTHS  . History of kidney stones   . History of GI bleed     "vessels burst in colon" x2  . Rheumatoid arthritis(714.0)     27 years  . Osteoarthritis   . Disc disease, degenerative, lumbar or lumbosacral     Chronic back pain now  . History of vertigo     Past Surgical History  Procedure Laterality Date  . Total knee arthroplasty  2003    Bilateral  . Nissen fundoplication      about 2005, pt not sure records not currently available  . Lithotripsy for nephrolithiasis      Pt and wife not sure when but here in [place]  . Lysis of adhesion, small bowel resection  2009  . Hernia repair    . Inguinal hernia repair  2007    Bilateral   . Ventral hernia repair with mesh    . Esophagogastroduodenoscopy N/A 10/01/2012    Procedure: ESOPHAGOGASTRODUODENOSCOPY (EGD);  Surgeon: Charolett Bumpers, MD;  Location: [facility];  Service: Endoscopy;  Laterality: N/A;  . Balloon  dilation N/A 10/01/2012    Procedure: BALLOON DILATION;  Surgeon: Charolett Bumpers, MD;  Location: [facility];  Service: Endoscopy;  Laterality: N/A;  . Cystoscopy with ureteroscopy Right 12/05/2012    Procedure: CYSTOSCOPY WITH RIGHT URETEROSCOPY AND STONE EXTRACTION;  Surgeon: Anner Crete, MD;  Location: [facility];  Service: Urology;  Laterality: Right;  . Transurethral prostatectomy with gyrus instruments N/A 12/05/2012    Procedure: TRANSURETHRAL PROSTATECTOMY WITH GYRUS INSTRUMENTS;  Surgeon: Anner Crete, MD;  Location: [facility];  Service: Urology;  Laterality: N/A;    Prior to Admission medications   Medication Sig Start Date End Date Taking? Authorizing Provider  acetaminophen (TYLENOL) 650 MG CR tablet Take 1,300 mg by mouth 2 (two) times daily.   Yes Historical Provider, MD  [employer] [facility] ALLERGY EYE RELIEF) SOLN Apply 1 drop to eye daily as needed (for allergies).   Yes Historical Provider, MD  hydrocortisone cream 1 % Apply 1 application topically as needed (for rash in groin).   Yes Historical Provider, MD  leflunomide (ARAVA) 20 MG tablet Take 20 mg by mouth every morning.    Yes Historical Provider, MD  Multiple Vitamins-Minerals (ICAPS) CAPS Take 1 capsule by mouth daily.   Yes Historical Provider, MD  omeprazole (PRILOSEC) 20 MG capsule Take 20 mg by mouth daily.  Yes Historical Provider, MD  predniSONE (DELTASONE) 5 MG tablet Take 5 mg by mouth daily.   Yes Historical Provider, MD  traMADol (ULTRAM) 50 MG tablet Take 50 mg by mouth 2 (two) times daily.   Yes Historical Provider, MD    Current Facility-Administered Medications  Medication Dose Route Frequency Provider Last Rate Last Dose  . 0.9 %  sodium chloride infusion   Intravenous Continuous Flint Melter, MD 125 mL/hr at 02/04/13 0617    . acetaminophen (TYLENOL) tablet 1,000 mg  1,000 mg Oral BID Elease Etienne, MD   1,000 mg at 02/03/13 2255  . albuterol (PROVENTIL) (5 MG/ML) 0.5% nebulizer  solution 2.5 mg  2.5 mg Nebulization Q2H PRN Elease Etienne, MD      . ciprofloxacin (CIPRO) IVPB 400 mg  400 mg Intravenous Q12H Elease Etienne, MD   400 mg at 02/04/13 0615  . leflunomide (ARAVA) tablet 20 mg  20 mg Oral q morning - 10a Elease Etienne, MD      . multivitamin-lutein (OCUVITE-LUTEIN) capsule 1 capsule  1 capsule Oral Daily Elease Etienne, MD      . ondansetron (ZOFRAN) tablet 4 mg  4 mg Oral Q6H PRN Elease Etienne, MD       Or  . ondansetron (ZOFRAN) injection 4 mg  4 mg Intravenous Q6H PRN Elease Etienne, MD      . pantoprazole (PROTONIX) EC tablet 40 mg  40 mg Oral Daily Elease Etienne, MD      . predniSONE (DELTASONE) tablet 10 mg  10 mg Oral Daily Elease Etienne, MD      . sodium chloride 0.9 % injection 3 mL  3 mL Intravenous Q12H Elease Etienne, MD      . traMADol Janean Sark) tablet 50 mg  50 mg Oral BID Elease Etienne, MD   50 mg at 02/03/13 2257    Allergies as of 02/03/2013 - Review Complete 02/03/2013  Allergen Reaction Noted  . Clindamycin/lincomycin Hives and Swelling 04/14/2011  . Codeine Hives and Swelling 04/14/2011  . Penicillins Hives and Swelling 04/14/2011    History reviewed. No pertinent family history.  History   Social History  . Marital Status: Married    Spouse Name: N/A    Number of Children: N/A  . Years of Education: N/A   Occupational History  . Not on file.   Social History Main Topics  . Smoking status: Never Smoker   . Smokeless tobacco: Never Used  . Alcohol Use: No  . Drug Use: No  . Sexual Activity: Not Currently   Other Topics Concern  . Not on file   Social History Narrative  . No narrative on file    Review of Systems: As per HPI, all others negative.  Physical Exam: Vital signs in last 24 hours: Temp:  [97.5 F (36.4 C)-98.3 F (36.8 C)] 97.5 F (36.4 C) (10/21 0754) Pulse Rate:  [54-86] 58 (10/21 0800) Resp:  [10-23] 16 (10/21 0800) BP: (107-149)/(52-89) 142/66 mmHg (10/21 0800) SpO2:   [94 %-100 %] 97 % (10/21 0800) Weight:  [80.74 kg (178 lb)] 80.74 kg (178 lb) (10/20 0922) Last BM Date: 02/03/13 General:   Alert,  Well-developed, well-nourished, pleasant and cooperative in NAD Head:  Normocephalic and atraumatic. Eyes:  Sclera clear, no icterus.   Conjunctiva pink. Ears:  Normal auditory acuity. Nose:  No deformity, discharge,  or lesions. Mouth:  No deformity or lesions.  Oropharynx pink & moist.  Neck:  Supple; no masses or thyromegaly. Lungs:  Clear throughout to auscultation.   No wheezes, crackles, or rhonchi. No acute distress. Heart:  Regular rate and rhythm; no murmurs, clicks, rubs,  or gallops. Abdomen:  Soft, nontender and nondistended. Old surgical scar.  No masses, hepatosplenomegaly or hernias noted. Normal bowel sounds, without guarding, and without rebound.     Msk:  Symmetrical without gross deformities. Normal posture. Pulses:  Normal pulses noted. Extremities:  Without clubbing or edema. Neurologic:  Alert and  oriented x4;  grossly normal neurologically. Skin:  Intact without significant lesions or rashes. Cervical Nodes:  No significant cervical adenopathy. Psych:  Alert and cooperative. Normal mood and affect.   Lab Results:  Recent Labs  02/03/13 1434 02/03/13 2015 02/04/13 0245  WBC 8.9 7.5 6.5  HGB 9.7* 8.9* 7.8*  HCT 30.3* 28.5* 25.5*  PLT 196 201 184   BMET  Recent Labs  02/03/13 0945 02/04/13 0245  NA 138 142  K 3.5 4.0  CL 104 112  CO2 24 25  GLUCOSE 125* 90  BUN 13 15  CREATININE 0.98 0.92  CALCIUM 8.8 7.7*   LFT  Recent Labs  02/03/13 0945  PROT 7.2  ALBUMIN 3.2*  AST 17  ALT 10  ALKPHOS 69  BILITOT 0.5   PT/INR No results found for this basename: LABPROT, INR,  in the last 72 hours  Studies/Results: No results found.  Impression:  1.  Hematochezia.  Suspect diverticular bleeding.  Third episode over past 2.5 years.  Bleeding has seemingly stopped. 2.  Acute blood loss anemia.  Plan:  1.   Advance diet. 2.  If rebleeds, consider nuclear medicine tagged RBC study as next step in evaluation. 3.  If patient has repeated episodes of bleeding, might consider elective surgical consult, but this would likely require a complete colectomy.  Patient and I would both like to avoid this if at all possible. 4.  If no rebleeding, and tolerates diet, hopefully patient can be discharged in the next day or two.   LOS: 1 day   Zadia Uhde M  02/04/2013, 8:47 AM

## 2013-02-05 ENCOUNTER — Inpatient Hospital Stay (HOSPITAL_COMMUNITY): Payer: Medicare Other

## 2013-02-05 ENCOUNTER — Encounter (HOSPITAL_COMMUNITY): Payer: Self-pay | Admitting: Radiology

## 2013-02-05 DIAGNOSIS — K625 Hemorrhage of anus and rectum: Secondary | ICD-10-CM

## 2013-02-05 LAB — CBC
HCT: 26.9 % — ABNORMAL LOW (ref 39.0–52.0)
HCT: 24.6 % — ABNORMAL LOW (ref 39.0–52.0)
HCT: 27.6 % — ABNORMAL LOW (ref 39.0–52.0)
Hemoglobin: 9.8 g/dL — ABNORMAL LOW (ref 13.0–17.0)
Hemoglobin: 8.8 g/dL — ABNORMAL LOW (ref 13.0–17.0)
Hemoglobin: 8 g/dL — ABNORMAL LOW (ref 13.0–17.0)
Hemoglobin: 9 g/dL — ABNORMAL LOW (ref 13.0–17.0)
MCH: 25 pg — ABNORMAL LOW (ref 26.0–34.0)
MCH: 25.6 pg — ABNORMAL LOW (ref 26.0–34.0)
MCH: 26.5 pg (ref 26.0–34.0)
MCHC: 31 g/dL (ref 30.0–36.0)
MCHC: 32.5 g/dL (ref 30.0–36.0)
MCHC: 32.6 g/dL (ref 30.0–36.0)
MCV: 78.7 fL (ref 78.0–100.0)
MCV: 78.8 fL (ref 78.0–100.0)
Platelets: 185 10*3/uL (ref 150–400)
Platelets: 166 10*3/uL (ref 150–400)
RBC: 3.92 MIL/uL — ABNORMAL LOW (ref 4.22–5.81)
RBC: 3.42 MIL/uL — ABNORMAL LOW (ref 4.22–5.81)
RBC: 3.12 MIL/uL — ABNORMAL LOW (ref 4.22–5.81)
RBC: 3.4 MIL/uL — ABNORMAL LOW (ref 4.22–5.81)
RDW: 16.7 % — ABNORMAL HIGH (ref 11.5–15.5)
WBC: 8 10*3/uL (ref 4.0–10.5)
WBC: 7.2 10*3/uL (ref 4.0–10.5)

## 2013-02-05 LAB — PREPARE RBC (CROSSMATCH)

## 2013-02-05 MED ORDER — NITROGLYCERIN 1 MG/10 ML FOR IR/CATH LAB
INTRA_ARTERIAL | Status: AC
  Filled 2013-02-05: qty 10

## 2013-02-05 MED ORDER — MIDAZOLAM HCL 2 MG/2ML IJ SOLN
INTRAMUSCULAR | Status: AC | PRN
  Administered 2013-02-05: 2 mg via INTRAVENOUS

## 2013-02-05 MED ORDER — TECHNETIUM TC 99M-LABELED RED BLOOD CELLS IV KIT
25.0000 | PACK | Freq: Once | INTRAVENOUS | Status: AC | PRN
  Administered 2013-02-05: 25 via INTRAVENOUS

## 2013-02-05 MED ORDER — DEXTROSE-NACL 5-0.9 % IV SOLN
INTRAVENOUS | Status: DC
  Administered 2013-02-05 – 2013-02-06 (×2): via INTRAVENOUS

## 2013-02-05 MED ORDER — MIDAZOLAM HCL 2 MG/2ML IJ SOLN
INTRAMUSCULAR | Status: AC
  Filled 2013-02-05: qty 6

## 2013-02-05 MED ORDER — FENTANYL CITRATE 0.05 MG/ML IJ SOLN
INTRAMUSCULAR | Status: AC
  Filled 2013-02-05: qty 4

## 2013-02-05 MED ORDER — IOHEXOL 300 MG/ML  SOLN
150.0000 mL | Freq: Once | INTRAMUSCULAR | Status: AC | PRN
  Administered 2013-02-05: 80 mL via INTRA_ARTERIAL

## 2013-02-05 MED ORDER — FENTANYL CITRATE 0.05 MG/ML IJ SOLN
INTRAMUSCULAR | Status: AC | PRN
  Administered 2013-02-05: 50 ug via INTRAVENOUS

## 2013-02-05 NOTE — H&P (Signed)
Chief Complaint: "Blood in stool since 10/20." Referring Physician: Dr. Sharon Seller HPI: Mateus F [place] is an 72 y.o. male with PMHx of colonic diverticular GI bleed s/p embolization of small right colic branch in 07/2010 and angiogram in 2013 with no angiographic abnormalities seen at that time and the inferior mesenteric artery was attempted to be studied and unable to be cannulated. Patient states no recurrence until 10/20 at 0530 his stomach was "growling" and he used the restroom where he saw dark red blood with some clots fill the toilet. He presented to ED and hgb found to be trending down from 10.7 to 7.8, patient had PRBC transfusion and hgb trended up 9.8 on 10/21 then today continued bright red bleeding and hgb now 8.0 patient denies any abdominal pain, N/V, chest pain or shortness of breath. He did have NM scan today which revealed active bleeding and we have been consulted for angiogram and possible embolization.   Past Medical History:  Past Medical History  Diagnosis Date  . Bursitis     chronic hip pain  . BPH (benign prostatic hyperplasia)   . Blood transfusion     " no reaction from transfusion"  . GERD (gastroesophageal reflux disease)   . H/O hiatal hernia   . Spondylisthesis   . Osteoporosis   . Bruises easily   . Diverticulitis   . Pneumonia   . Rash     GROIN - ITCHING PAST 3 MONTHS  . History of kidney stones   . History of GI bleed     "vessels burst in colon" x2  . Rheumatoid arthritis(714.0)     27 years  . Osteoarthritis   . Disc disease, degenerative, lumbar or lumbosacral     Chronic back pain now  . History of vertigo     Past Surgical History:  Past Surgical History  Procedure Laterality Date  . Total knee arthroplasty  2003    Bilateral  . Nissen fundoplication      about 2005, pt not sure records not currently available  . Lithotripsy for nephrolithiasis      Pt and wife not sure when but here in [place]  . Lysis of adhesion, small bowel  resection  2009  . Hernia repair    . Inguinal hernia repair  2007    Bilateral   . Ventral hernia repair with mesh    . Esophagogastroduodenoscopy N/A 10/01/2012    Procedure: ESOPHAGOGASTRODUODENOSCOPY (EGD);  Surgeon: Charolett Bumpers, MD;  Location: [facility];  Service: Endoscopy;  Laterality: N/A;  . Balloon dilation N/A 10/01/2012    Procedure: BALLOON DILATION;  Surgeon: Charolett Bumpers, MD;  Location: [facility];  Service: Endoscopy;  Laterality: N/A;  . Cystoscopy with ureteroscopy Right 12/05/2012    Procedure: CYSTOSCOPY WITH RIGHT URETEROSCOPY AND STONE EXTRACTION;  Surgeon: Anner Crete, MD;  Location: [facility];  Service: Urology;  Laterality: Right;  . Transurethral prostatectomy with gyrus instruments N/A 12/05/2012    Procedure: TRANSURETHRAL PROSTATECTOMY WITH GYRUS INSTRUMENTS;  Surgeon: Anner Crete, MD;  Location: [facility];  Service: Urology;  Laterality: N/A;    Family History: History reviewed. No pertinent family history.  Social History:  reports that he has never smoked. He has never used smokeless tobacco. He reports that he does not drink alcohol or use illicit drugs.  Allergies:  Allergies  Allergen Reactions  . Clindamycin/Lincomycin Hives and Swelling  . Codeine Hives and Swelling  . Penicillins Hives and Swelling     Medication  List    ASK your doctor about these medications       acetaminophen 650 MG CR tablet  Commonly known as:  TYLENOL  Take 1,300 mg by mouth 2 (two) times daily.     hydrocortisone cream 1 %  Apply 1 application topically as needed (for rash in groin).     ICAPS Caps  Take 1 capsule by mouth daily.     leflunomide 20 MG tablet  Commonly known as:  ARAVA  Take 20 mg by mouth every morning.     omeprazole 20 MG capsule  Commonly known as:  PRILOSEC  Take 20 mg by mouth daily.     predniSONE 5 MG tablet  Commonly known as:  DELTASONE  Take 5 mg by mouth daily.     [employer] ALLERGY EYE RELIEF Soln  Apply 1 drop to  eye daily as needed (for allergies).     traMADol 50 MG tablet  Commonly known as:  ULTRAM  Take 50 mg by mouth 2 (two) times daily.       Please HPI for pertinent positives, otherwise complete 10 system ROS negative.  Physical Exam: BP 128/66  Pulse 65  Temp(Src) 98 F (36.7 C) (Oral)  Resp 11  Ht 5' 7.5" (1.715 m)  Wt 175 lb 4.3 oz (79.5 kg)  BMI 27.03 kg/m2  SpO2 99% Body mass index is 27.03 kg/(m^2).  General Appearance:  Alert, cooperative, no distress  Head:  Normocephalic, without obvious abnormality, atraumatic  Neck: Supple, symmetrical, trachea midline  Lungs:   Clear to auscultation bilaterally, no w/r/r, respirations unlabored without use of accessory muscles.  Chest Wall:  No tenderness or deformity  Heart:  Regular rate and rhythm, S1, S2 normal, no murmur, rub or gallop.  Abdomen:   Soft, non-tender, non distended, (+) BS  Extremities: Extremities normal, atraumatic, no cyanosis or edema  Pulses: 2+ and symmetric  Neurologic: Normal affect, no gross deficits.   Results for orders placed during the hospital encounter of 02/03/13 (from the past 48 hour(s))  MRSA PCR SCREENING     Status: None   Collection Time    02/03/13  5:29 PM      Result Value Range   MRSA by PCR NEGATIVE  NEGATIVE   Comment:            The GeneXpert MRSA Assay (FDA     approved for NASAL specimens     only), is one component of a     comprehensive MRSA colonization     surveillance program. It is not     intended to diagnose MRSA     infection nor to guide or     monitor treatment for     MRSA infections.  CBC     Status: Abnormal   Collection Time    02/03/13  8:15 PM      Result Value Range   WBC 7.5  4.0 - 10.5 K/uL   RBC 3.59 (*) 4.22 - 5.81 MIL/uL   Hemoglobin 8.9 (*) 13.0 - 17.0 g/dL   HCT 16.1 (*) 09.6 - 04.5 %   MCV 79.4  78.0 - 100.0 fL   MCH 24.8 (*) 26.0 - 34.0 pg   MCHC 31.2  30.0 - 36.0 g/dL   RDW 40.9  81.1 - 91.4 %   Platelets 201  150 - 400 K/uL  CBC      Status: Abnormal   Collection Time    02/04/13  2:45 AM  Result Value Range   WBC 6.5  4.0 - 10.5 K/uL   RBC 3.17 (*) 4.22 - 5.81 MIL/uL   Hemoglobin 7.8 (*) 13.0 - 17.0 g/dL   HCT 96.0 (*) 45.4 - 09.8 %   MCV 80.4  78.0 - 100.0 fL   MCH 24.6 (*) 26.0 - 34.0 pg   MCHC 30.6  30.0 - 36.0 g/dL   RDW 11.9  14.7 - 82.9 %   Platelets 184  150 - 400 K/uL  BASIC METABOLIC PANEL     Status: Abnormal   Collection Time    02/04/13  2:45 AM      Result Value Range   Sodium 142  135 - 145 mEq/L   Potassium 4.0  3.5 - 5.1 mEq/L   Chloride 112  96 - 112 mEq/L   CO2 25  19 - 32 mEq/L   Glucose, Bld 90  70 - 99 mg/dL   BUN 15  6 - 23 mg/dL   Creatinine, Ser 5.62  0.50 - 1.35 mg/dL   Calcium 7.7 (*) 8.4 - 10.5 mg/dL   GFR calc non Af Amer 83 (*) >90 mL/min   GFR calc Af Amer >90  >90 mL/min   Comment: (NOTE)     The eGFR has been calculated using the CKD EPI equation.     This calculation has not been validated in all clinical situations.     eGFR's persistently <90 mL/min signify possible Chronic Kidney     Disease.  PREPARE RBC (CROSSMATCH)     Status: None   Collection Time    02/04/13  8:14 AM      Result Value Range   Order Confirmation ORDER PROCESSED BY BLOOD BANK    CBC     Status: Abnormal   Collection Time    02/04/13  4:50 PM      Result Value Range   WBC 8.0  4.0 - 10.5 K/uL   RBC 3.92 (*) 4.22 - 5.81 MIL/uL   Hemoglobin 9.8 (*) 13.0 - 17.0 g/dL   Comment: POST TRANSFUSION SPECIMEN   HCT 31.6 (*) 39.0 - 52.0 %   MCV 80.6  78.0 - 100.0 fL   MCH 25.0 (*) 26.0 - 34.0 pg   MCHC 31.0  30.0 - 36.0 g/dL   RDW 13.0 (*) 86.5 - 78.4 %   Platelets 185  150 - 400 K/uL  CBC     Status: Abnormal   Collection Time    02/05/13  7:10 AM      Result Value Range   WBC 7.2  4.0 - 10.5 K/uL   RBC 3.42 (*) 4.22 - 5.81 MIL/uL   Hemoglobin 8.8 (*) 13.0 - 17.0 g/dL   HCT 69.6 (*) 29.5 - 28.4 %   MCV 78.7  78.0 - 100.0 fL   MCH 25.7 (*) 26.0 - 34.0 pg   MCHC 32.7  30.0 - 36.0 g/dL    RDW 13.2 (*) 44.0 - 15.5 %   Platelets 176  150 - 400 K/uL  CBC     Status: Abnormal   Collection Time    02/05/13  9:36 AM      Result Value Range   WBC 6.6  4.0 - 10.5 K/uL   RBC 3.12 (*) 4.22 - 5.81 MIL/uL   Hemoglobin 8.0 (*) 13.0 - 17.0 g/dL   HCT 10.2 (*) 72.5 - 36.6 %   MCV 78.8  78.0 - 100.0 fL   MCH 25.6 (*) 26.0 - 34.0  pg   MCHC 32.5  30.0 - 36.0 g/dL   RDW 09.8 (*) 11.9 - 14.7 %   Platelets 179  150 - 400 K/uL  PREPARE RBC (CROSSMATCH)     Status: None   Collection Time    02/05/13 11:05 AM      Result Value Range   Order Confirmation ORDER PROCESSED BY BLOOD BANK     No results found.  Assessment/Plan GI bleed, active bleed on NM scan today hgb trending down 8.0 after transfusion. BP stable. History of colonic diverticular bleed s/p embolization 2012 of small right colic artery branch. S/p angiogram in 2013 with no angiographic abnormalities. Request for mesenteric angiogram with possible embolization today. Patient has been NPO, labs reviewed, images reviewed. Risks and Benefits discussed with the patient and his wife. All of the patient's questions were answered, patient is agreeable to proceed. Consent signed and in chart.    Pattricia Boss D PA-C 02/05/2013, 3:01 PM

## 2013-02-05 NOTE — Procedures (Signed)
Procedure:  SMA arteriography Access:  Right CFA, 5 Fr sheath Findings:  Pre and post NTG arteriography of SMA shows no GI bleed at level of colon and specifically, hepatic flexure region where previous embolization performed.

## 2013-02-05 NOTE — Progress Notes (Signed)
Subjective: Several episodes of bleeding, starting this morning. No abdominal pain.  Objective: Vital signs in last 24 hours: Temp:  [98 F (36.7 C)-98.5 F (36.9 C)] 98.1 F (36.7 C) (10/22 0735) Pulse Rate:  [57-82] 66 (10/22 0735) Resp:  [13-24] 20 (10/22 0735) BP: (113-153)/(45-131) 132/87 mmHg (10/22 0735) SpO2:  [95 %-100 %] 100 % (10/22 0735) Weight:  [79.5 kg (175 lb 4.3 oz)] 79.5 kg (175 lb 4.3 oz) (10/22 0416) Weight change: -1.24 kg (-2 lb 11.8 oz) Last BM Date: 02/04/13  PE: GEN:  NAD ABD:  Soft, non-tender, hyperactive bowel sounds  Lab Results: CBC    Component Value Date/Time   WBC 7.2 02/05/2013 0710   WBC 6.7 10/28/2010 1056   RBC 3.42* 02/05/2013 0710   RBC 4.38 04/15/2011 1110   RBC 4.96 10/28/2010 1056   HGB 8.8* 02/05/2013 0710   HGB 11.7* 10/28/2010 1056   HCT 26.9* 02/05/2013 0710   HCT 38.5 10/28/2010 1056   PLT 176 02/05/2013 0710   PLT 211 10/28/2010 1056   MCV 78.7 02/05/2013 0710   MCV 77.6* 10/28/2010 1056   MCH 25.7* 02/05/2013 0710   MCH 23.6* 10/28/2010 1056   MCHC 32.7 02/05/2013 0710   MCHC 30.4* 10/28/2010 1056   RDW 16.7* 02/05/2013 0710   RDW 15.8* 10/28/2010 1056   LYMPHSABS 1.8 02/03/2013 0945   LYMPHSABS 1.5 10/28/2010 1056   MONOABS 1.0 02/03/2013 0945   MONOABS 0.5 10/28/2010 1056   EOSABS 0.4 02/03/2013 0945   EOSABS 0.1 10/28/2010 1056   BASOSABS 0.1 02/03/2013 0945   BASOSABS 0.0 10/28/2010 1056   Assessment:  1.  Recurrent GIB, suspect diverticular, hemodynamically stable.  Plan:  1.  Agree with tagged RBC study. 2.  Surgical consult, given recurrent diverticular bleeding. 3.  Will follow.   [name] 02/05/2013, 9:46 AM

## 2013-02-05 NOTE — Plan of Care (Signed)
RN paged. Pt with bloody stools since yesterday. Hgb 8.8. Per GI notes: recommend NM Bleeding scan if re-bleeds. I have ordered STAT and made pt NPO.  Junious Silk, ANP

## 2013-02-05 NOTE — Progress Notes (Signed)
Pt continues to have active rectal bleed. Since 7 AM today, pt had had 5 large watery bright red BM. NP/PA notified. Order received for a STAT CBC. Will continue to monitor.

## 2013-02-05 NOTE — Progress Notes (Signed)
[facility] Progress Note [place] TEAM 1 - Stepdown ICU Team   [name] ZOX:[id number] DOB: 08/24/1940 DOA: 02/03/2013 PCP: Ginette Otto, MD  Brief narrative: 72 y.o. male admitted for hematochezia. Patient has no abdominal pain. This is his third episode of hematochezia since April 2012. Had colonoscopy for bleeding in April 2012 by Dr. 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 In addition he had a UA that appeared consitent with a UTI   Assessment/Plan:    Lower GI bleed/knownDiverticulosis of colon with hemorrhage -appreciate GI assistance -developed recurrent bleeding this am  -NM Bleeding scan was positive for ACTIVE bleeding in hepatic flexure- d/w Dr. Outlaw/rec consult IR to see if candidate for repeat coiling procedure o/w will need urgent surgical consultation - have discussed w/ Dr. Fredia Sorrow and he PLANS TO EVALUATE FOR REPEAT COIL PROCEDURE    Acute post-hemorrhagic anemia -hgb had drifted down to 7.8 from 10.7 and was transfused 2 units PRBCs on 10/21 w/ increase in Hgb to 9.8 -today w/ recurrent bleeding Hgb has drifted back down to 8.0 so will transfuse 2 more units    ?? UTI (lower urinary tract infection) -urine cx no growth so anbx's were dc'd    Rheumatoid arthritis/ Long-term current use of steroids -clarified home dose Prednisone as 5 mg daily - convert to IV -cont Arava unless must remain NPO   DVT prophylaxis: SCDs Code Status: Full Family Communication: Patient and wife at bedside Disposition Plan/Expected LOS: Stepdown  Consultants: Gastroenterology  Procedures: None  Antibiotics: Cipro 10/20 >>> 10/21  HPI/Subjective: Patient alert. Frustrated by recurrence of bleeding. Feels weak now that has brisk bleeding..  Objective: Blood pressure 128/66, pulse 65, temperature 98.1 F (36.7 C),  temperature source Oral, resp. rate 11, height 5' 7.5" (1.715 m), weight 79.5 kg (175 lb 4.3 oz), SpO2 99.00%.  Intake/Output Summary (Last 24 hours) at 02/05/13 1242 Last data filed at 02/05/13 0331  Gross per 24 hour  Intake   1320 ml  Output   1350 ml  Net    -30 ml   Exam: General: No acute respiratory distress Lungs: Clear to auscultation bilaterally without wheezes or crackles, RA Cardiovascular: Regular rate and rhythm without murmur gallop or rub normal S1 and S2, no peripheral edema or JVD Abdomen: Nontender, nondistended, soft, bowel sounds positive, no rebound, no ascites, no appreciable mass Musculoskeletal: No significant cyanosis, clubbing of bilateral lower extremities Neurological: Alert and oriented x 3, moves all extremities x 4 without focal neurological deficits, CN 2-12 intact  Scheduled Meds:  Scheduled Meds: . acetaminophen  1,000 mg Oral BID  . leflunomide  20 mg Oral q morning - 10a  . multivitamin-lutein  1 capsule Oral Daily  . pantoprazole  40 mg Oral Daily  . predniSONE  5 mg Oral Daily  . sodium chloride  3 mL Intravenous Q12H  . traMADol  50 mg Oral BID   Data Reviewed: Basic Metabolic Panel:  Recent Labs Lab 02/03/13 0945 02/04/13 0245  NA 138 142  K 3.5 4.0  CL 104 112  CO2 24 25  GLUCOSE 125* 90  BUN 13 15  CREATININE 0.98 0.92  CALCIUM 8.8 7.7*   Liver Function Tests:  Recent Labs Lab 02/03/13 0945  AST 17  ALT 10  ALKPHOS 69  BILITOT 0.5  PROT 7.2  ALBUMIN 3.2*   CBC:  Recent Labs Lab 02/03/13 0945  02/03/13 2015 02/04/13  0245 02/04/13 1650 02/05/13 0710 02/05/13 0936  WBC 10.3  < > 7.5 6.5 8.0 7.2 6.6  NEUTROABS 6.9  --   --   --   --   --   --   HGB 10.7*  < > 8.9* 7.8* 9.8* 8.8* 8.0*  HCT 34.6*  < > 28.5* 25.5* 31.6* 26.9* 24.6*  MCV 79.7  < > 79.4 80.4 80.6 78.7 78.8  PLT 242  < > 201 184 185 176 179  < > = values in this interval not displayed.   Recent Results (from the past 240 hour(s))  URINE  CULTURE     Status: None   Collection Time    02/03/13 11:02 AM      Result Value Range Status   Specimen Description URINE, RANDOM   Final   Special Requests NONE   Final   Culture  Setup Time     Final   Value: 02/03/2013 12:24     Performed at [facility] Count     Final   Value: NO GROWTH     Performed at [facility]   Culture     Final   Value: NO GROWTH     Performed at [facility]   Report Status 02/04/2013 FINAL   Final  MRSA PCR SCREENING     Status: None   Collection Time    02/03/13  5:29 PM      Result Value Range Status   MRSA by PCR NEGATIVE  NEGATIVE Final   Comment:            The GeneXpert MRSA Assay (FDA     approved for NASAL specimens     only), is one component of a     comprehensive MRSA colonization     surveillance program. It is not     intended to diagnose MRSA     infection nor to guide or     monitor treatment for     MRSA infections.     Studies:  Recent x-ray studies have been reviewed in detail by the Attending Physician     Junious Silk, ANP [facility] Office  [phone] Pager [phone]  **If unable to reach the above provider after paging please contact the Flow Manager @ [phone]  On-Call/Text Page:      [email or web address]      password [email or web address]  If 7PM-7AM, please contact night-coverage [email or web address] Password [email or web address] 02/05/2013, 12:42 PM   LOS: 2 days   I have personally examined this patient and reviewed the entire database. I have reviewed the above note, made any necessary editorial changes, and agree with its content.  Lonia Blood, MD [facility]

## 2013-02-05 NOTE — H&P (Signed)
Agree 

## 2013-02-06 ENCOUNTER — Encounter (HOSPITAL_COMMUNITY): Payer: Self-pay | Admitting: General Surgery

## 2013-02-06 DIAGNOSIS — K922 Gastrointestinal hemorrhage, unspecified: Secondary | ICD-10-CM

## 2013-02-06 LAB — COMPREHENSIVE METABOLIC PANEL
ALT: 6 U/L (ref 0–53)
AST: 12 U/L (ref 0–37)
Albumin: 2.3 g/dL — ABNORMAL LOW (ref 3.5–5.2)
CO2: 25 mEq/L (ref 19–32)
Calcium: 7.6 mg/dL — ABNORMAL LOW (ref 8.4–10.5)
Creatinine, Ser: 0.95 mg/dL (ref 0.50–1.35)
GFR calc non Af Amer: 82 mL/min — ABNORMAL LOW (ref >90)
Sodium: 140 mEq/L (ref 135–145)
Total Bilirubin: 0.4 mg/dL (ref 0.3–1.2)
Total Protein: 4.8 g/dL — ABNORMAL LOW (ref 6.0–8.3)

## 2013-02-06 LAB — CBC
HCT: 23.8 % — ABNORMAL LOW (ref 39.0–52.0)
HCT: 27.5 % — ABNORMAL LOW (ref 39.0–52.0)
Hemoglobin: 9.2 g/dL — ABNORMAL LOW (ref 13.0–17.0)
MCH: 27 pg (ref 26.0–34.0)
MCH: 27.1 pg (ref 26.0–34.0)
MCHC: 33.2 g/dL (ref 30.0–36.0)
MCHC: 33.5 g/dL (ref 30.0–36.0)
MCV: 81.2 fL (ref 78.0–100.0)
Platelets: 159 10*3/uL (ref 150–400)
RDW: 16.4 % — ABNORMAL HIGH (ref 11.5–15.5)
RDW: 15.9 % — ABNORMAL HIGH (ref 11.5–15.5)
WBC: 8.8 10*3/uL (ref 4.0–10.5)

## 2013-02-06 LAB — PREPARE RBC (CROSSMATCH)

## 2013-02-06 MED ORDER — SODIUM CHLORIDE 0.9 % IV BOLUS (SEPSIS)
500.0000 mL | Freq: Once | INTRAVENOUS | Status: AC
  Administered 2013-02-06: 500 mL via INTRAVENOUS

## 2013-02-06 NOTE — Consult Note (Signed)
[name] 18-Jun-1940  [id number].   Primary Care MD: Dr. Zenovia Foley, Rheumatologist Requesting MD: Dr. Calvert Cantor Chief Complaint/Reason for Consult: GI bleed HPI: This is a 72 yo male who has a history of recurrent diverticular bleeds who underwent emobolization in 2012 of the hepatic flexure.  He did well until 2013 when he rebled and was hospitalized again.  This episode stopped on it's own.  He began bleeding again this past Monday.  He was admitted and is currently getting his 5th and 6th unit of pRBCs.  The patient had  Nuclear medicine bleeding scan yesterday which was positive for the hepatic flexure.  He then underwent angiogram, but was negative.  He had a colonoscopy 2 years ago that revealed pandiverticulosis.  We have been admitted for further evaluation given continued bleeding.  ROS: please see HPI, otherwise patient denies abdominal pain, admits to weakness.  States blood is bright red and some old darker clots.  Otherwise all other systems are negative.  History reviewed. No pertinent family history.  Past Medical History  Diagnosis Date  . Bursitis     chronic hip pain  . BPH (benign prostatic hyperplasia)   . Blood transfusion     " no reaction from transfusion"  . GERD (gastroesophageal reflux disease)   . H/O hiatal hernia   . Spondylisthesis   . Osteoporosis   . Bruises easily   . Diverticulitis   . Pneumonia   . Rash     GROIN - ITCHING PAST 3 MONTHS  . History of kidney stones   . History of GI bleed     "vessels burst in colon" x2  . Rheumatoid arthritis(714.0)     27 years  . Osteoarthritis   . Disc disease, degenerative, lumbar or lumbosacral     Chronic back pain now  . History of vertigo     Past Surgical History  Procedure Laterality Date  . Total knee arthroplasty  2003    Bilateral  . Nissen fundoplication      about 2005, pt not sure records not currently available  . Lithotripsy for nephrolithiasis      Pt and wife not sure  when but here in [place]  . Lysis of adhesion, small bowel resection  2009  . Hernia repair    . Inguinal hernia repair  2007    Bilateral   . Ventral hernia repair with mesh    . Esophagogastroduodenoscopy N/A 10/01/2012    Procedure: ESOPHAGOGASTRODUODENOSCOPY (EGD);  Surgeon: Charolett Bumpers, MD;  Location: [facility];  Service: Endoscopy;  Laterality: N/A;  . Balloon dilation N/A 10/01/2012    Procedure: BALLOON DILATION;  Surgeon: Charolett Bumpers, MD;  Location: [facility];  Service: Endoscopy;  Laterality: N/A;  . Cystoscopy with ureteroscopy Right 12/05/2012    Procedure: CYSTOSCOPY WITH RIGHT URETEROSCOPY AND STONE EXTRACTION;  Surgeon: Anner Crete, MD;  Location: [facility];  Service: Urology;  Laterality: Right;  . Transurethral prostatectomy with gyrus instruments N/A 12/05/2012    Procedure: TRANSURETHRAL PROSTATECTOMY WITH GYRUS INSTRUMENTS;  Surgeon: Anner Crete, MD;  Location: [facility];  Service: Urology;  Laterality: N/A;    Social History:  reports that he has never smoked. He has never used smokeless tobacco. He reports that he does not drink alcohol or use illicit drugs.  Allergies:  Allergies  Allergen Reactions  . Clindamycin/Lincomycin Hives and Swelling  . Codeine Hives and Swelling  . Penicillins Hives and Swelling    Medications Prior  to Admission  Medication Sig Dispense Refill  . acetaminophen (TYLENOL) 650 MG CR tablet Take 1,300 mg by mouth 2 (two) times daily.      . Homeopathic Products [facility] ALLERGY EYE RELIEF) SOLN Apply 1 drop to eye daily as needed (for allergies).      . hydrocortisone cream 1 % Apply 1 application topically as needed (for rash in groin).      Marland Kitchen leflunomide (ARAVA) 20 MG tablet Take 20 mg by mouth every morning.       . Multiple Vitamins-Minerals (ICAPS) CAPS Take 1 capsule by mouth daily.      Marland Kitchen omeprazole (PRILOSEC) 20 MG capsule Take 20 mg by mouth daily.        . predniSONE (DELTASONE) 5 MG tablet Take 5 mg by mouth  daily.      . traMADol (ULTRAM) 50 MG tablet Take 50 mg by mouth 2 (two) times daily.        Blood pressure 151/64, pulse 72, temperature 98.4 F (36.9 C), temperature source Oral, resp. rate 12, height 5' 7.5" (1.715 m), weight 174 lb 2.6 oz (79 kg), SpO2 100.00%. Physical Exam: General: pleasant, WD, WN white male who is laying in bed in NAD HEENT: head is normocephalic, atraumatic.  Sclera are noninjected.  PERRL.  Ears and nose without any masses or lesions.  Mouth is pink and moist Heart: regular, rate, and rhythm.  Normal s1,s2. No obvious murmurs, gallops, or rubs noted.  Palpable radial and pedal pulses bilaterally Lungs: CTAB, no wheezes, rhonchi, or rales noted.  Respiratory effort nonlabored Abd: soft, NT, ND, +BS, no masses, hernias, or organomegaly MS: all 4 extremities are symmetrical with no cyanosis, clubbing, or edema. Skin: warm and dry with no masses, lesions, or rashes Psych: A&Ox3 with an appropriate affect.    Results for orders placed during the hospital encounter of 02/03/13 (from the past 48 hour(s))  CBC     Status: Abnormal   Collection Time    02/04/13  4:50 PM      Result Value Range   WBC 8.0  4.0 - 10.5 K/uL   RBC 3.92 (*) 4.22 - 5.81 MIL/uL   Hemoglobin 9.8 (*) 13.0 - 17.0 g/dL   Comment: POST TRANSFUSION SPECIMEN   HCT 31.6 (*) 39.0 - 52.0 %   MCV 80.6  78.0 - 100.0 fL   MCH 25.0 (*) 26.0 - 34.0 pg   MCHC 31.0  30.0 - 36.0 g/dL   RDW 16.1 (*) 09.6 - 04.5 %   Platelets 185  150 - 400 K/uL  CBC     Status: Abnormal   Collection Time    02/05/13  7:10 AM      Result Value Range   WBC 7.2  4.0 - 10.5 K/uL   RBC 3.42 (*) 4.22 - 5.81 MIL/uL   Hemoglobin 8.8 (*) 13.0 - 17.0 g/dL   HCT 40.9 (*) 81.1 - 91.4 %   MCV 78.7  78.0 - 100.0 fL   MCH 25.7 (*) 26.0 - 34.0 pg   MCHC 32.7  30.0 - 36.0 g/dL   RDW 78.2 (*) 95.6 - 21.3 %   Platelets 176  150 - 400 K/uL  CBC     Status: Abnormal   Collection Time    02/05/13  9:36 AM      Result Value Range    WBC 6.6  4.0 - 10.5 K/uL   RBC 3.12 (*) 4.22 - 5.81 MIL/uL   Hemoglobin 8.0 (*)  13.0 - 17.0 g/dL   HCT 82.9 (*) 56.2 - 13.0 %   MCV 78.8  78.0 - 100.0 fL   MCH 25.6 (*) 26.0 - 34.0 pg   MCHC 32.5  30.0 - 36.0 g/dL   RDW 86.5 (*) 78.4 - 69.6 %   Platelets 179  150 - 400 K/uL  PREPARE RBC (CROSSMATCH)     Status: None   Collection Time    02/05/13 11:05 AM      Result Value Range   Order Confirmation ORDER PROCESSED BY BLOOD BANK    CBC     Status: Abnormal   Collection Time    02/05/13 10:42 PM      Result Value Range   WBC 9.1  4.0 - 10.5 K/uL   RBC 3.40 (*) 4.22 - 5.81 MIL/uL   Hemoglobin 9.0 (*) 13.0 - 17.0 g/dL   HCT 29.5 (*) 28.4 - 13.2 %   MCV 81.2  78.0 - 100.0 fL   MCH 26.5  26.0 - 34.0 pg   MCHC 32.6  30.0 - 36.0 g/dL   RDW 44.0 (*) 10.2 - 72.5 %   Platelets 166  150 - 400 K/uL  COMPREHENSIVE METABOLIC PANEL     Status: Abnormal   Collection Time    02/06/13  5:25 AM      Result Value Range   Sodium 140  135 - 145 mEq/L   Potassium 3.9  3.5 - 5.1 mEq/L   Chloride 110  96 - 112 mEq/L   CO2 25  19 - 32 mEq/L   Glucose, Bld 108 (*) 70 - 99 mg/dL   BUN 14  6 - 23 mg/dL   Creatinine, Ser 3.66  0.50 - 1.35 mg/dL   Calcium 7.6 (*) 8.4 - 10.5 mg/dL   Total Protein 4.8 (*) 6.0 - 8.3 g/dL   Albumin 2.3 (*) 3.5 - 5.2 g/dL   AST 12  0 - 37 U/L   ALT 6  0 - 53 U/L   Alkaline Phosphatase 44  39 - 117 U/L   Total Bilirubin 0.4  0.3 - 1.2 mg/dL   GFR calc non Af Amer 82 (*) >90 mL/min   GFR calc Af Amer >90  >90 mL/min   Comment: (NOTE)     The eGFR has been calculated using the CKD EPI equation.     This calculation has not been validated in all clinical situations.     eGFR's persistently <90 mL/min signify possible Chronic Kidney     Disease.  CBC     Status: Abnormal   Collection Time    02/06/13 11:15 AM      Result Value Range   WBC 8.8  4.0 - 10.5 K/uL   RBC 2.93 (*) 4.22 - 5.81 MIL/uL   Hemoglobin 7.9 (*) 13.0 - 17.0 g/dL   HCT 44.0 (*) 34.7 - 42.5 %    MCV 81.2  78.0 - 100.0 fL   MCH 27.0  26.0 - 34.0 pg   MCHC 33.2  30.0 - 36.0 g/dL   RDW 95.6 (*) 38.7 - 56.4 %   Platelets 159  150 - 400 K/uL  PREPARE RBC (CROSSMATCH)     Status: None   Collection Time    02/06/13 12:52 PM      Result Value Range   Order Confirmation ORDER PROCESSED BY BLOOD BANK     Nm Gi Blood Loss  02/05/2013   CLINICAL DATA:  Gastrointestinal bleeding  EXAM: NUCLEAR MEDICINE  GASTROINTESTINAL BLEEDING SCAN  TECHNIQUE: Sequential abdominal images were obtained following intravenous administration of Tc-24m labeled red blood cells.  COMPARISON:  None.  RADIOPHARMACEUTICALS:  CURIE ULTRATAG TECHNETIUM TC 19M-LABELED RED BLOOD CELLS IV KITmCi Tc-55m in-vitro labeled red cells.  FINDINGS: There in the initial hr of imaging, there is faint uptake in the region of the hepatic flexure. During the 2nd hr min imaging, there is intense uptake within the region of the hepatic flexure of which courses across the location of the transverse colon and descending colon and a pattern consistent with peristalsis of the colon. Physiologic activity in the urinary system is noted. Blood pool activity is also present.  IMPRESSION: The above study is compatible with acute gastrointestinal bleeding within the hepatic flexure of the colon.   Electronically Signed   By: Maryclare Bean M.D.   On: 02/05/2013 15:08   Ir Angiogram Visceral Selective  02/05/2013   CLINICAL DATA:  Lower GI bleed emanating from the hepatic flexure of the colon by nuclear medicine bleeding scan. The patient has a history of right-sided diverticular bleed with prior transcatheter coil embolization.  EXAM: IR ULTRASOUND GUIDANCE VASC ACCESS RIGHT; SELECTIVE VISCERAL ARTERIOGRAPHY  CONTRAST:  CONTRAST 80 mL Omnipaque 300  PROCEDURE: Informed consent was obtained from the patient for selective arteriography and possible embolization. The right groin was sterilely prepped with Betadine and draped. Local anesthesia was provided  with 1% lidocaine.  Ultrasound was used to confirm patency of the right common femoral artery. Under ultrasound guidance, the right common femoral artery was accessed utilizing a micropuncture set. A 5 French vascular sheath was placed.  A 5 French Cobra catheter was introduced into the aorta. This was used to selectively catheterize the superior mesenteric artery. Catheter was advanced over a guidewire in the SMA trunk. Selective arteriography was performed. 100 mcg of intra-arterial a nitroglycerin was administered to the catheter. Additional selective arteriography was then performed. After the procedure, hemostasis was obtained with manual compression and use of a V-Pad after sheath removal.  Complications: None  FINDINGS: SMA arteriography shows normally patent branches supplying small bowel and colon. There is no evidence of active extravasation of contrast material. Examination was focused to the proximal half of the colon. Previously placed hepatic flexure embolization coils are in stable position. No vascular malformations are identified. The venous phase shows normally patent venous drainage.  IMPRESSION: Normal SMA arteriography demonstrating no evidence of active GI bleed. A target vessel for embolization was not identifiable.  PREPERATION AND PROCEDURE MEDICATIONS: Sedation: 2 mg IV Versed, 50 mcg IV fentanyl.  Total Moderate Sedation Time: 21 minutes.  100 mcg intra arterial nitroglycerin.   Electronically Signed   By: Irish Lack M.D.   On: 02/05/2013 17:15   Ir US Guide Vasc Access Right  02/05/2013   CLINICAL DATA:  Lower GI bleed emanating from the hepatic flexure of the colon by nuclear medicine bleeding scan. The patient has a history of right-sided diverticular bleed with prior transcatheter coil embolization.  EXAM: IR ULTRASOUND GUIDANCE VASC ACCESS RIGHT; SELECTIVE VISCERAL ARTERIOGRAPHY  CONTRAST:  CONTRAST 80 mL Omnipaque 300  PROCEDURE: Informed consent was obtained from the  patient for selective arteriography and possible embolization. The right groin was sterilely prepped with Betadine and draped. Local anesthesia was provided with 1% lidocaine.  Ultrasound was used to confirm patency of the right common femoral artery. Under ultrasound guidance, the right common femoral artery was accessed utilizing a micropuncture set. A 5 French vascular sheath was placed.  A 5 French Cobra catheter was introduced into the aorta. This was used to selectively catheterize the superior mesenteric artery. Catheter was advanced over a guidewire in the SMA trunk. Selective arteriography was performed. 100 mcg of intra-arterial a nitroglycerin was administered to the catheter. Additional selective arteriography was then performed. After the procedure, hemostasis was obtained with manual compression and use of a V-Pad after sheath removal.  Complications: None  FINDINGS: SMA arteriography shows normally patent branches supplying small bowel and colon. There is no evidence of active extravasation of contrast material. Examination was focused to the proximal half of the colon. Previously placed hepatic flexure embolization coils are in stable position. No vascular malformations are identified. The venous phase shows normally patent venous drainage.  IMPRESSION: Normal SMA arteriography demonstrating no evidence of active GI bleed. A target vessel for embolization was not identifiable.  PREPERATION AND PROCEDURE MEDICATIONS: Sedation: 2 mg IV Versed, 50 mcg IV fentanyl.  Total Moderate Sedation Time: 21 minutes.  100 mcg intra arterial nitroglycerin.   Electronically Signed   By: Irish Lack M.D.   On: 02/05/2013 17:15       Assessment/Plan 1. LGI bleed, likely location of hepatic flexure 2. ABL anemia Patient Active Problem List   Diagnosis Date Noted  . Diverticulosis of colon with hemorrhage 02/04/2013  . Acute post-hemorrhagic anemia 02/03/2013  . UTI (lower urinary tract infection)  02/03/2013  . Lower GI bleed 04/14/2011  . Rheumatoid arthritis(714.0) 04/14/2011  . Long-term current use of steroids 04/14/2011  . Iron deficiency anemia 04/14/2011   Plan: 1. The patient has had his 3rd episode of diverticular bleeding in three years.  Each time the location has been the hepatic flexure, including this episode, noted on nuclear medicine scan.  He does have pandiverticulosis, but has some fairly convincing information to point to the right colon as the source of bleeding.  The patient would not like to immediate jump to an operation; however, after he receives these two units of blood, if his bleeding does not cease, then he would like to pursue a partial colectomy.  The patient is om prednisone and Arava, which to put him at higher risk for healing problems.  Transfuse as needed.  We will follow closely.  Npo p MN in case he needs OR tomorrow.  Hoyle Barkdull E 02/06/2013, 2:10 PM Pager: [phone]

## 2013-02-06 NOTE — Progress Notes (Signed)
Subjective: Patient s/p mesenteric angiogram 10/22 with no angiographic abnormality. He states still with bloody stools overnight, however less frequent. He did have (2) episodes overnight bright red stool. He denies any abdominal pain. He denies any right groin access site pain, bleeding or swelling.  Objective: Physical Exam: BP 120/67  Pulse 71  Temp(Src) 98.2 F (36.8 C) (Oral)  Resp 20  Ht 5' 7.5" (1.715 m)  Wt 174 lb 2.6 oz (79 kg)  BMI 26.86 kg/m2  SpO2 98%  Abd: Soft, NT, ND, (+) BS Ext: right femoral access site dressing C/D/I, no signs of bleeding, hematoma, or infection, soft. DP intact bilaterally.  Labs: CBC  Recent Labs  02/05/13 0936 02/05/13 2242  WBC 6.6 9.1  HGB 8.0* 9.0*  HCT 24.6* 27.6*  PLT 179 166   BMET  Recent Labs  02/04/13 0245 02/06/13 0525  NA 142 140  K 4.0 3.9  CL 112 110  CO2 25 25  GLUCOSE 90 108*  BUN 15 14  CREATININE 0.92 0.95  CALCIUM 7.7* 7.6*   LFT  Recent Labs  02/06/13 0525  PROT 4.8*  ALBUMIN 2.3*  AST 12  ALT 6  ALKPHOS 44  BILITOT 0.4   PT/INR No results found for this basename: LABPROT, INR,  in the last 72 hours   Studies/Results: Nm Gi Blood Loss  02/05/2013   CLINICAL DATA:  Gastrointestinal bleeding  EXAM: NUCLEAR MEDICINE GASTROINTESTINAL BLEEDING SCAN  TECHNIQUE: Sequential abdominal images were obtained following intravenous administration of Tc-37m labeled red blood cells.  COMPARISON:  None.  RADIOPHARMACEUTICALS:  CURIE ULTRATAG TECHNETIUM TC 60M-LABELED RED BLOOD CELLS IV KITmCi Tc-70m in-vitro labeled red cells.  FINDINGS: There in the initial hr of imaging, there is faint uptake in the region of the hepatic flexure. During the 2nd hr min imaging, there is intense uptake within the region of the hepatic flexure of which courses across the location of the transverse colon and descending colon and a pattern consistent with peristalsis of the colon. Physiologic activity in the urinary system  is noted. Blood pool activity is also present.  IMPRESSION: The above study is compatible with acute gastrointestinal bleeding within the hepatic flexure of the colon.   Electronically Signed   By: Maryclare Bean M.D.   On: 02/05/2013 15:08   Ir Angiogram Visceral Selective  02/05/2013   CLINICAL DATA:  Lower GI bleed emanating from the hepatic flexure of the colon by nuclear medicine bleeding scan. The patient has a history of right-sided diverticular bleed with prior transcatheter coil embolization.  EXAM: IR ULTRASOUND GUIDANCE VASC ACCESS RIGHT; SELECTIVE VISCERAL ARTERIOGRAPHY  CONTRAST:  CONTRAST 80 mL Omnipaque 300  PROCEDURE: Informed consent was obtained from the patient for selective arteriography and possible embolization. The right groin was sterilely prepped with Betadine and draped. Local anesthesia was provided with 1% lidocaine.  Ultrasound was used to confirm patency of the right common femoral artery. Under ultrasound guidance, the right common femoral artery was accessed utilizing a micropuncture set. A 5 French vascular sheath was placed.  A 5 French Cobra catheter was introduced into the aorta. This was used to selectively catheterize the superior mesenteric artery. Catheter was advanced over a guidewire in the SMA trunk. Selective arteriography was performed. 100 mcg of intra-arterial a nitroglycerin was administered to the catheter. Additional selective arteriography was then performed. After the procedure, hemostasis was obtained with manual compression and use of a V-Pad after sheath removal.  Complications: None  FINDINGS: SMA arteriography shows normally  patent branches supplying small bowel and colon. There is no evidence of active extravasation of contrast material. Examination was focused to the proximal half of the colon. Previously placed hepatic flexure embolization coils are in stable position. No vascular malformations are identified. The venous phase shows normally patent venous  drainage.  IMPRESSION: Normal SMA arteriography demonstrating no evidence of active GI bleed. A target vessel for embolization was not identifiable.  PREPERATION AND PROCEDURE MEDICATIONS: Sedation: 2 mg IV Versed, 50 mcg IV fentanyl.  Total Moderate Sedation Time: 21 minutes.  100 mcg intra arterial nitroglycerin.   Electronically Signed   By: Irish Lack M.D.   On: 02/05/2013 17:15   Ir US Guide Vasc Access Right  02/05/2013   CLINICAL DATA:  Lower GI bleed emanating from the hepatic flexure of the colon by nuclear medicine bleeding scan. The patient has a history of right-sided diverticular bleed with prior transcatheter coil embolization.  EXAM: IR ULTRASOUND GUIDANCE VASC ACCESS RIGHT; SELECTIVE VISCERAL ARTERIOGRAPHY  CONTRAST:  CONTRAST 80 mL Omnipaque 300  PROCEDURE: Informed consent was obtained from the patient for selective arteriography and possible embolization. The right groin was sterilely prepped with Betadine and draped. Local anesthesia was provided with 1% lidocaine.  Ultrasound was used to confirm patency of the right common femoral artery. Under ultrasound guidance, the right common femoral artery was accessed utilizing a micropuncture set. A 5 French vascular sheath was placed.  A 5 French Cobra catheter was introduced into the aorta. This was used to selectively catheterize the superior mesenteric artery. Catheter was advanced over a guidewire in the SMA trunk. Selective arteriography was performed. 100 mcg of intra-arterial a nitroglycerin was administered to the catheter. Additional selective arteriography was then performed. After the procedure, hemostasis was obtained with manual compression and use of a V-Pad after sheath removal.  Complications: None  FINDINGS: SMA arteriography shows normally patent branches supplying small bowel and colon. There is no evidence of active extravasation of contrast material. Examination was focused to the proximal half of the colon. Previously  placed hepatic flexure embolization coils are in stable position. No vascular malformations are identified. The venous phase shows normally patent venous drainage.  IMPRESSION: Normal SMA arteriography demonstrating no evidence of active GI bleed. A target vessel for embolization was not identifiable.  PREPERATION AND PROCEDURE MEDICATIONS: Sedation: 2 mg IV Versed, 50 mcg IV fentanyl.  Total Moderate Sedation Time: 21 minutes.  100 mcg intra arterial nitroglycerin.   Electronically Signed   By: Irish Lack M.D.   On: 02/05/2013 17:15   Assessment/Plan: Recurrent hematochezia with history of colonic diverticular bleed s/p embolization of right colic branch in 2012. (+) NM scan 10/22 for GIB within hepatic flexure of colon.  S/p mesenteric angiogram 10/22 with no angiographic abnormalities seen. H/H stable, continue to monitor.   LOS: 3 days    Cloretta Ned 02/06/2013 9:40 AM

## 2013-02-06 NOTE — Progress Notes (Signed)
[facility] Progress Note [place] TEAM 1 - Stepdown ICU Team   [name] ZOX:[id number] DOB: Dec 08, 1940 DOA: 02/03/2013 PCP: Ginette Otto, MD  Brief narrative: 72 y.o. male admitted for hematochezia. Patient has no abdominal pain. This is his third episode of hematochezia since April 2012. Had colonoscopy for bleeding in April 2012 by Dr. 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 In addition he had a UA that appeared consitent with a UTI   Assessment/Plan:    Lower GI bleed/knownDiverticulosis of colon with hemorrhage -appreciate GI assistance -developed recurrent bleeding this am  -NM Bleeding scan was positive for ACTIVE bleeding in hepatic flexure- d/w Dr. Outlaw/rec consult IR to see if candidate for repeat coiling procedure  -IR eval- arteriogram 10/22 but no bleeding seen -cont w/ bleeding overnight but slowed -GI rec surgical eval- note that episodes of GIB in 2012 and this admit were BOTH in hepatic flexure which seems to narrow foci of bleeding if surgery anticipated -NPO for now    Acute post-hemorrhagic anemia -hgb had drifted down to 7.8 from 10.7 and was transfused 2 units PRBCs on 10/21 w/ increase in Hgb to 9.8 -10/22: w/ recurrent bleeding Hgb had drifted back down to 8.0 so will transfuse 2 more units -10/23: add'l bleeding overnight but more clots- pt symptomatic with weakness- repeat Hgb has decreased from 9 to 7.9 so will transfuse 2 more units -BP soft so will give 500 cc NS     ?? UTI (lower urinary tract infection) -urine cx no growth so anbx's were dc'd    Rheumatoid arthritis/ Long-term current use of steroids -clarified home dose Prednisone as 5 mg daily - converted to IV -cont Arava unless must remain NPO   DVT prophylaxis: SCDs Code Status: Full Family Communication: Patient and wife at  bedside Disposition Plan/Expected LOS: Stepdown  Consultants: Gastroenterology [facility]  Procedures: None  Antibiotics: Cipro 10/20 >>> 10/21  HPI/Subjective: Patient alert. Frustrated by recurrence of bleeding. Feels weak now that has brisk bleeding..  Objective: Blood pressure 116/38, pulse 66, temperature 98.2 F (36.8 C), temperature source Oral, resp. rate 18, height 5' 7.5" (1.715 m), weight 79 kg (174 lb 2.6 oz), SpO2 99.00%.  Intake/Output Summary (Last 24 hours) at 02/06/13 1251 Last data filed at 02/06/13 1104  Gross per 24 hour  Intake 1062.5 ml  Output    201 ml  Net  861.5 ml   Exam: General: No acute respiratory distress-pale/weak Lungs: Clear to auscultation bilaterally without wheezes or crackles, RA Cardiovascular: Regular rate and rhythm without murmur gallop or rub normal S1 and S2, no peripheral edema or JVD-BP soft Abdomen: Nontender, nondistended, soft, bowel sounds positive, no rebound, no ascites, no appreciable mass Musculoskeletal: No significant cyanosis, clubbing of bilateral lower extremities Neurological: Alert and oriented x 3, moves all extremities x 4 without focal neurological deficits, CN 2-12 intact  Scheduled Meds:  Scheduled Meds: . acetaminophen  1,000 mg Oral BID  . leflunomide  20 mg Oral q morning - 10a  . multivitamin-lutein  1 capsule Oral Daily  . pantoprazole  40 mg Oral Daily  . predniSONE  5 mg Oral Daily  . sodium chloride  3 mL Intravenous Q12H  . traMADol  50 mg Oral BID   Data Reviewed: Basic Metabolic Panel:  Recent Labs Lab 02/03/13 0945 02/04/13 0245 02/06/13 0525  NA 138 142 140  K 3.5 4.0 3.9  CL 104  112 110  CO2 24 25 25   GLUCOSE 125* 90 108*  BUN 13 15 14   CREATININE 0.98 0.92 0.95  CALCIUM 8.8 7.7* 7.6*   Liver Function Tests:  Recent Labs Lab 02/03/13 0945 02/06/13 0525  AST 17 12  ALT 10 6  ALKPHOS 69 44  BILITOT 0.5 0.4  PROT 7.2 4.8*  ALBUMIN 3.2* 2.3*   CBC:  Recent Labs Lab  02/03/13 0945  02/04/13 1650 02/05/13 0710 02/05/13 0936 02/05/13 2242 02/06/13 1115  WBC 10.3  < > 8.0 7.2 6.6 9.1 8.8  NEUTROABS 6.9  --   --   --   --   --   --   HGB 10.7*  < > 9.8* 8.8* 8.0* 9.0* 7.9*  HCT 34.6*  < > 31.6* 26.9* 24.6* 27.6* 23.8*  MCV 79.7  < > 80.6 78.7 78.8 81.2 81.2  PLT 242  < > 185 176 179 166 159  < > = values in this interval not displayed.   Recent Results (from the past 240 hour(s))  URINE CULTURE     Status: None   Collection Time    02/03/13 11:02 AM      Result Value Range Status   Specimen Description URINE, RANDOM   Final   Special Requests NONE   Final   Culture  Setup Time     Final   Value: 02/03/2013 12:24     Performed at [facility] Count     Final   Value: NO GROWTH     Performed at [facility]   Culture     Final   Value: NO GROWTH     Performed at [facility]   Report Status 02/04/2013 FINAL   Final  MRSA PCR SCREENING     Status: None   Collection Time    02/03/13  5:29 PM      Result Value Range Status   MRSA by PCR NEGATIVE  NEGATIVE Final   Comment:            The GeneXpert MRSA Assay (FDA     approved for NASAL specimens     only), is one component of a     comprehensive MRSA colonization     surveillance program. It is not     intended to diagnose MRSA     infection nor to guide or     monitor treatment for     MRSA infections.     Studies:  Recent x-ray studies have been reviewed in detail by the Attending Physician     Junious Silk, ANP [facility] Office  [phone] Pager [phone]  **If unable to reach the above provider after paging please contact the Flow Manager @ [phone]  On-Call/Text Page:      [email or web address]      password [email or web address]  If 7PM-7AM, please contact night-coverage [email or web address] Password [facility] 02/06/2013, 12:51 PM   LOS: 3 days    I have examined the patient, reviewed the chart and modified the above note which I agree with.    Lorcan Shelp,MD [phone] 02/06/2013, 5:19 PM

## 2013-02-06 NOTE — Consult Note (Signed)
Patient interviewed and examined, agree with PA note above.  All imaging history reviewed. Bleeding appears very well localized to the hepatic flexure. He currently is stable and feels that the bleeding is slowing down and would like to give it a chance to stop. He is receiving his fifth and sixth units of packed red cells. If the hemoglobin does not stabilize and clinical bleeding stopped at this point we will need to proceed with right hemicolectomy. This was discussed with the patient and his wife and they are in agreement.  Mariella Saa MD, FACS  02/06/2013 4:03 PM

## 2013-02-06 NOTE — Progress Notes (Signed)
Subjective: Patient continues to bleed intermittently. Is understandably frustrated.  Objective: Vital signs in last 24 hours: Temp:  [97.6 F (36.4 C)-98.4 F (36.9 C)] 98 F (36.7 C) (10/23 0434) Pulse Rate:  [55-88] 76 (10/23 0434) Resp:  [8-25] 16 (10/23 0434) BP: (113-149)/(37-81) 116/37 mmHg (10/23 0434) SpO2:  [93 %-100 %] 96 % (10/23 0434) Weight:  [79 kg (174 lb 2.6 oz)] 79 kg (174 lb 2.6 oz) (10/23 0434) Weight change: -0.5 kg (-1 lb 1.6 oz) Last BM Date: 02/05/13  PE: GEN:  NAD ABD:  Soft  Lab Results: CBC    Component Value Date/Time   WBC 9.1 02/05/2013 2242   WBC 6.7 10/28/2010 1056   RBC 3.40* 02/05/2013 2242   RBC 4.38 04/15/2011 1110   RBC 4.96 10/28/2010 1056   HGB 9.0* 02/05/2013 2242   HGB 11.7* 10/28/2010 1056   HCT 27.6* 02/05/2013 2242   HCT 38.5 10/28/2010 1056   PLT 166 02/05/2013 2242   PLT 211 10/28/2010 1056   MCV 81.2 02/05/2013 2242   MCV 77.6* 10/28/2010 1056   MCH 26.5 02/05/2013 2242   MCH 23.6* 10/28/2010 1056   MCHC 32.6 02/05/2013 2242   MCHC 30.4* 10/28/2010 1056   RDW 16.2* 02/05/2013 2242   RDW 15.8* 10/28/2010 1056   LYMPHSABS 1.8 02/03/2013 0945   LYMPHSABS 1.5 10/28/2010 1056   MONOABS 1.0 02/03/2013 0945   MONOABS 0.5 10/28/2010 1056   EOSABS 0.4 02/03/2013 0945   EOSABS 0.1 10/28/2010 1056   BASOSABS 0.1 02/03/2013 0945   BASOSABS 0.0 10/28/2010 1056   Assessment:  1.  Recurrent episodic hematochezia.  Prior bleeding at hepatic flexure in setting of diverticulosis.  Tagged scan yesterday again positive at hepatic flexure, but angiogram did not show any site of bleeding.  Patient has subsequently began rebleeding again, fortunately is hemodynamically stable.  Plan:  1.  Serial CBCs, volume repletion as needed. 2.  Surgical consultation.  If patient has recurrent bleeding, and embolization can't be performed, I am worried he might need surgery (since he has pancolonic diverticulosis, however, this may require a complete  colectomy), though both patient and I would like to avoid surgery if at all possible. 3.  Will follow.   Freddy Jaksch 02/06/2013, 8:47 AM

## 2013-02-07 DIAGNOSIS — D509 Iron deficiency anemia, unspecified: Secondary | ICD-10-CM

## 2013-02-07 LAB — TYPE AND SCREEN
ABO/RH(D): A NEG
Unit division: 0
Unit division: 0
Unit division: 0

## 2013-02-07 LAB — CBC
HCT: 27 % — ABNORMAL LOW (ref 39.0–52.0)
MCHC: 34.1 g/dL (ref 30.0–36.0)
MCV: 81.1 fL (ref 78.0–100.0)
Platelets: 163 10*3/uL (ref 150–400)
RDW: 16.4 % — ABNORMAL HIGH (ref 11.5–15.5)

## 2013-02-07 NOTE — Progress Notes (Signed)
Subjective: He looks good, had a small BM this Am with some clot in it.  He has never had any pain.  Objective: Vital signs in last 24 hours: Temp:  [98.1 F (36.7 C)-98.5 F (36.9 C)] 98.4 F (36.9 C) (10/24 0753) Pulse Rate:  [55-81] 69 (10/24 0753) Resp:  [10-32] 14 (10/24 0753) BP: (98-175)/(38-85) 145/73 mmHg (10/24 0753) SpO2:  [95 %-100 %] 95 % (10/24 0753) Last BM Date: 02/06/13 1 stool yesterday, 9 stools day prior. Afebrile, BP and HR are stable H/H 9.2/27 today Intake/Output from previous day: 10/23 0701 - 10/24 0700 In: 1400 [I.V.:250; Blood:650; IV Piggyback:500] Out: 500 [Urine:500] Intake/Output this shift:    General appearance: alert, cooperative and no distress Resp: clear to auscultation bilaterally GI: soft, non-tender; bowel sounds normal; no masses,  no organomegaly  Lab Results:   Recent Labs  02/06/13 2200 02/07/13 0625  WBC 10.1 9.0  HGB 9.2* 9.2*  HCT 27.5* 27.0*  PLT 159 163    BMET  Recent Labs  02/06/13 0525  NA 140  K 3.9  CL 110  CO2 25  GLUCOSE 108*  BUN 14  CREATININE 0.95  CALCIUM 7.6*   PT/INR No results found for this basename: LABPROT, INR,  in the last 72 hours   Recent Labs Lab 02/03/13 0945 02/06/13 0525  AST 17 12  ALT 10 6  ALKPHOS 69 44  BILITOT 0.5 0.4  PROT 7.2 4.8*  ALBUMIN 3.2* 2.3*     Lipase  No results found for this basename: lipase     Studies/Results: Nm Gi Blood Loss  02/05/2013   CLINICAL DATA:  Gastrointestinal bleeding  EXAM: NUCLEAR MEDICINE GASTROINTESTINAL BLEEDING SCAN  TECHNIQUE: Sequential abdominal images were obtained following intravenous administration of Tc-27m labeled red blood cells.  COMPARISON:  None.  RADIOPHARMACEUTICALS:  CURIE ULTRATAG TECHNETIUM TC 71M-LABELED RED BLOOD CELLS IV KITmCi Tc-40m in-vitro labeled red cells.  FINDINGS: There in the initial hr of imaging, there is faint uptake in the region of the hepatic flexure. During the 2nd hr min  imaging, there is intense uptake within the region of the hepatic flexure of which courses across the location of the transverse colon and descending colon and a pattern consistent with peristalsis of the colon. Physiologic activity in the urinary system is noted. Blood pool activity is also present.  IMPRESSION: The above study is compatible with acute gastrointestinal bleeding within the hepatic flexure of the colon.   Electronically Signed   By: Maryclare Bean M.D.   On: 02/05/2013 15:08   Ir Angiogram Visceral Selective  02/05/2013   CLINICAL DATA:  Lower GI bleed emanating from the hepatic flexure of the colon by nuclear medicine bleeding scan. The patient has a history of right-sided diverticular bleed with prior transcatheter coil embolization.  EXAM: IR ULTRASOUND GUIDANCE VASC ACCESS RIGHT; SELECTIVE VISCERAL ARTERIOGRAPHY  CONTRAST:  CONTRAST 80 mL Omnipaque 300  PROCEDURE: Informed consent was obtained from the patient for selective arteriography and possible embolization. The right groin was sterilely prepped with Betadine and draped. Local anesthesia was provided with 1% lidocaine.  Ultrasound was used to confirm patency of the right common femoral artery. Under ultrasound guidance, the right common femoral artery was accessed utilizing a micropuncture set. A 5 French vascular sheath was placed.  A 5 French Cobra catheter was introduced into the aorta. This was used to selectively catheterize the superior mesenteric artery. Catheter was advanced over a guidewire in the SMA trunk. Selective arteriography was performed.  100 mcg of intra-arterial a nitroglycerin was administered to the catheter. Additional selective arteriography was then performed. After the procedure, hemostasis was obtained with manual compression and use of a V-Pad after sheath removal.  Complications: None  FINDINGS: SMA arteriography shows normally patent branches supplying small bowel and colon. There is no evidence of active  extravasation of contrast material. Examination was focused to the proximal half of the colon. Previously placed hepatic flexure embolization coils are in stable position. No vascular malformations are identified. The venous phase shows normally patent venous drainage.  IMPRESSION: Normal SMA arteriography demonstrating no evidence of active GI bleed. A target vessel for embolization was not identifiable.  PREPERATION AND PROCEDURE MEDICATIONS: Sedation: 2 mg IV Versed, 50 mcg IV fentanyl.  Total Moderate Sedation Time: 21 minutes.  100 mcg intra arterial nitroglycerin.   Electronically Signed   By: Irish Lack M.D.   On: 02/05/2013 17:15   Ir US Guide Vasc Access Right  02/05/2013   CLINICAL DATA:  Lower GI bleed emanating from the hepatic flexure of the colon by nuclear medicine bleeding scan. The patient has a history of right-sided diverticular bleed with prior transcatheter coil embolization.  EXAM: IR ULTRASOUND GUIDANCE VASC ACCESS RIGHT; SELECTIVE VISCERAL ARTERIOGRAPHY  CONTRAST:  CONTRAST 80 mL Omnipaque 300  PROCEDURE: Informed consent was obtained from the patient for selective arteriography and possible embolization. The right groin was sterilely prepped with Betadine and draped. Local anesthesia was provided with 1% lidocaine.  Ultrasound was used to confirm patency of the right common femoral artery. Under ultrasound guidance, the right common femoral artery was accessed utilizing a micropuncture set. A 5 French vascular sheath was placed.  A 5 French Cobra catheter was introduced into the aorta. This was used to selectively catheterize the superior mesenteric artery. Catheter was advanced over a guidewire in the SMA trunk. Selective arteriography was performed. 100 mcg of intra-arterial a nitroglycerin was administered to the catheter. Additional selective arteriography was then performed. After the procedure, hemostasis was obtained with manual compression and use of a V-Pad after sheath  removal.  Complications: None  FINDINGS: SMA arteriography shows normally patent branches supplying small bowel and colon. There is no evidence of active extravasation of contrast material. Examination was focused to the proximal half of the colon. Previously placed hepatic flexure embolization coils are in stable position. No vascular malformations are identified. The venous phase shows normally patent venous drainage.  IMPRESSION: Normal SMA arteriography demonstrating no evidence of active GI bleed. A target vessel for embolization was not identifiable.  PREPERATION AND PROCEDURE MEDICATIONS: Sedation: 2 mg IV Versed, 50 mcg IV fentanyl.  Total Moderate Sedation Time: 21 minutes.  100 mcg intra arterial nitroglycerin.   Electronically Signed   By: Irish Lack M.D.   On: 02/05/2013 17:15    Medications: . acetaminophen  1,000 mg Oral BID  . leflunomide  20 mg Oral q morning - 10a  . multivitamin-lutein  1 capsule Oral Daily  . pantoprazole  40 mg Oral Daily  . predniSONE  5 mg Oral Daily  . sodium chloride  3 mL Intravenous Q12H  . traMADol  50 mg Oral BID    Assessment/Plan Recurrent [facility]GI BLEED with prior scans showing hepatic flexure  Angiogram 02/05/13 shows no bleed at the level of the colon/hepatic flexure. Dr. Fredia Sorrow S/p embolization right colic artery 07/2010/arteriorgram 2013 Anemia  Hx of diverticulosis with hemorrhage Rheumatoid Arthritis on long term steroids Hx of Nissen fundoplication/Inguinal hernia repairs Hx of nephrolithiasis  Plan:  He feels good, and wants to know if he can go home this afternoon.  Will defer to GI and medicine.  We will continue to follow.    LOS: 4 days    [name] 02/07/2013

## 2013-02-07 NOTE — Progress Notes (Signed)
Patient interviewed and examined, agree with PA note above. He does not want surgery now and clinically appears to have stopped bleeding. He understands if he has any further bleeding then would strongly recommend proceeding with colectomy. He understands he needs to be observed for a day or 2 to be sure the bleeding had stopped. Mariella Saa MD, FACS  02/07/2013 6:25 PM

## 2013-02-07 NOTE — Progress Notes (Signed)
Pt's rt arm which had his iv fluid running looked swollen, fluid was stopped, iv team was notified to come and start another iv, pt refused iv to be started when iv team got on the floor stating that it was too late into the night to start an iv and that he would rather have it started in the morning. This morning, pt again refused an iv start. Will report off to the morning nurse to follow up.----Sanjit Mcmichael, rn

## 2013-02-07 NOTE — Progress Notes (Signed)
Subjective: No bleeding since yesterday afternoon. No abdominal pain.  Objective: Vital signs in last 24 hours: Temp:  [98.1 F (36.7 C)-98.5 F (36.9 C)] 98.4 F (36.9 C) (10/24 0753) Pulse Rate:  [55-81] 69 (10/24 0753) Resp:  [10-32] 14 (10/24 0753) BP: (98-175)/(38-85) 145/73 mmHg (10/24 0753) SpO2:  [95 %-100 %] 95 % (10/24 0753) Weight change:  Last BM Date: 02/06/13  PE: GEN:  NAD  Lab Results: CBC    Component Value Date/Time   WBC 9.0 02/07/2013 0625   WBC 6.7 10/28/2010 1056   RBC 3.33* 02/07/2013 0625   RBC 4.38 04/15/2011 1110   RBC 4.96 10/28/2010 1056   HGB 9.2* 02/07/2013 0625   HGB 11.7* 10/28/2010 1056   HCT 27.0* 02/07/2013 0625   HCT 38.5 10/28/2010 1056   PLT 163 02/07/2013 0625   PLT 211 10/28/2010 1056   MCV 81.1 02/07/2013 0625   MCV 77.6* 10/28/2010 1056   MCH 27.6 02/07/2013 0625   MCH 23.6* 10/28/2010 1056   MCHC 34.1 02/07/2013 0625   MCHC 30.4* 10/28/2010 1056   RDW 16.4* 02/07/2013 0625   RDW 15.8* 10/28/2010 1056   LYMPHSABS 1.8 02/03/2013 0945   LYMPHSABS 1.5 10/28/2010 1056   MONOABS 1.0 02/03/2013 0945   MONOABS 0.5 10/28/2010 1056   EOSABS 0.4 02/03/2013 0945   EOSABS 0.1 10/28/2010 1056   BASOSABS 0.1 02/03/2013 0945   BASOSABS 0.0 10/28/2010 1056   Assessment:  1.  Diverticular bleeding of hepatic flexure.  No bleeding for nearly 24 hours.  Plan:  1.  Advance diet. 2.  Follow CBCs. 3.  If no further bleeding, hopefully home this weekend.  If rebleeds, would likely need surgery (right hemicolectomy). 4.  [facility] inpatient team to follow.   [name] 02/07/2013, 10:35 AM

## 2013-02-07 NOTE — Progress Notes (Signed)
[facility] Progress Note [place] TEAM 1 - Stepdown ICU Team   [name] ZOX:[id number] DOB: 05-23-1940 DOA: 02/03/2013 PCP: Ginette Otto, MD  Brief narrative: 72 y.o. male admitted for hematochezia. Patient has no abdominal pain. This is his third episode of hematochezia since April 2012. Had colonoscopy for bleeding in April 2012 by Dr. 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 In addition he had a UA that appeared consitent with a UTI   Assessment/Plan:    Lower GI bleed/knownDiverticulosis of colon with hemorrhage -appreciate GI assistance  -NM Bleeding scan was positive (10/22) for ACTIVE bleeding in hepatic flexure- d/w Dr. Narda Rutherford consult IR to see if candidate for repeat coiling procedure  -IR eval- arteriogram 10/22 but no bleeding seen -cont w/ bleeding into 10/23 but slowed -GI rec surgical eval- note that episodes of GIB in 2012 and this admit were BOTH in hepatic flexure which seems to narrow foci of bleeding if surgery anticipated -since above, bleeding has stopped so will slowly advance to low residue diet -if bleeding recurs will need surgery    Acute post-hemorrhagic anemia -hgb had drifted down to 7.8 from 10.7 and was transfused 2 units PRBCs on 10/21 w/ increase in Hgb to 9.8 -10/22: w/ recurrent bleeding Hgb had drifted back down to 8.0 so was transfused 2 more units -10/23: add'l bleeding overnight but more clots- pt symptomatic with weakness- repeat Hgb has decreased from 9 to 7.9 so was transfused 2 more units -BP better and Hgb remains stable for now    ?? UTI (lower urinary tract infection) -urine cx no growth so anbx's were dc'd    Rheumatoid arthritis/ Long-term current use of steroids -clarified home dose Prednisone as 5 mg daily - converted from IV back to PO (10/24) -cont Arava    DVT prophylaxis:  SCDs Code Status: Full Family Communication: Patient and wife at bedside Disposition Plan/Expected LOS: Stepdown  Consultants: Gastroenterology CCS  Procedures: None  Antibiotics: Cipro 10/20 >>> 10/21  HPI/Subjective: Patient alert. No more bleeding. Hungry.  Objective: Blood pressure 145/73, pulse 69, temperature 98.4 F (36.9 C), temperature source Oral, resp. rate 14, height 5' 7.5" (1.715 m), weight 79 kg (174 lb 2.6 oz), SpO2 95.00%.  Intake/Output Summary (Last 24 hours) at 02/07/13 1120 Last data filed at 02/07/13 0300  Gross per 24 hour  Intake    900 ml  Output    300 ml  Net    600 ml   Exam: General: No acute respiratory distress-pale/weak Lungs: Clear to auscultation bilaterally without wheezes or crackles, RA Cardiovascular: Regular rate and rhythm without murmur gallop or rub normal S1 and S2, no peripheral edema or JVD Abdomen: Nontender, nondistended, soft, bowel sounds positive, no rebound, no ascites, no appreciable mass Musculoskeletal: No significant cyanosis, clubbing of bilateral lower extremities Neurological: Alert and oriented x 3, moves all extremities x 4 without focal neurological deficits, CN 2-12 intact  Scheduled Meds:  Scheduled Meds: . acetaminophen  1,000 mg Oral BID  . leflunomide  20 mg Oral q morning - 10a  . multivitamin-lutein  1 capsule Oral Daily  . pantoprazole  40 mg Oral Daily  . predniSONE  5 mg Oral Daily  . sodium chloride  3 mL Intravenous Q12H  . traMADol  50 mg Oral BID   Data Reviewed: Basic Metabolic Panel:  Recent Labs Lab 02/03/13 0945 02/04/13 0245 02/06/13 0525  NA 138 142  140  K 3.5 4.0 3.9  CL 104 112 110  CO2 24 25 25   GLUCOSE 125* 90 108*  BUN 13 15 14   CREATININE 0.98 0.92 0.95  CALCIUM 8.8 7.7* 7.6*   Liver Function Tests:  Recent Labs Lab 02/03/13 0945 02/06/13 0525  AST 17 12  ALT 10 6  ALKPHOS 69 44  BILITOT 0.5 0.4  PROT 7.2 4.8*  ALBUMIN 3.2* 2.3*   CBC:  Recent Labs Lab  02/03/13 0945  02/05/13 0936 02/05/13 2242 02/06/13 1115 02/06/13 2200 02/07/13 0625  WBC 10.3  < > 6.6 9.1 8.8 10.1 9.0  NEUTROABS 6.9  --   --   --   --   --   --   HGB 10.7*  < > 8.0* 9.0* 7.9* 9.2* 9.2*  HCT 34.6*  < > 24.6* 27.6* 23.8* 27.5* 27.0*  MCV 79.7  < > 78.8 81.2 81.2 80.9 81.1  PLT 242  < > 179 166 159 159 163  < > = values in this interval not displayed.   Recent Results (from the past 240 hour(s))  URINE CULTURE     Status: None   Collection Time    02/03/13 11:02 AM      Result Value Range Status   Specimen Description URINE, RANDOM   Final   Special Requests NONE   Final   Culture  Setup Time     Final   Value: 02/03/2013 12:24     Performed at [facility] Count     Final   Value: NO GROWTH     Performed at [facility]   Culture     Final   Value: NO GROWTH     Performed at [facility]   Report Status 02/04/2013 FINAL   Final  MRSA PCR SCREENING     Status: None   Collection Time    02/03/13  5:29 PM      Result Value Range Status   MRSA by PCR NEGATIVE  NEGATIVE Final   Comment:            The GeneXpert MRSA Assay (FDA     approved for NASAL specimens     only), is one component of a     comprehensive MRSA colonization     surveillance program. It is not     intended to diagnose MRSA     infection nor to guide or     monitor treatment for     MRSA infections.     Studies:  Recent x-ray studies have been reviewed in detail by the Attending Physician     Junious Silk, ANP [facility] Office  [phone] Pager [phone]  **If unable to reach the above provider after paging please contact the Flow Manager @ [phone]  On-Call/Text Page:      [email or web address]      password [email or web address]  If 7PM-7AM, please contact night-coverage [email or web address] Password [email or web address] 02/07/2013, 11:20 AM   LOS: 4 days   I have examined the patient, reviewed the chart and modified the above note which I agree with.    Daquisha Clermont,MD [phone] 02/07/2013, 2:38 PM

## 2013-02-08 LAB — BASIC METABOLIC PANEL
CO2: 25 mEq/L (ref 19–32)
Calcium: 7.8 mg/dL — ABNORMAL LOW (ref 8.4–10.5)
Glucose, Bld: 88 mg/dL (ref 70–99)
Sodium: 140 mEq/L (ref 135–145)

## 2013-02-08 LAB — CBC
HCT: 28 % — ABNORMAL LOW (ref 39.0–52.0)
Hemoglobin: 8.4 g/dL — ABNORMAL LOW (ref 13.0–17.0)
MCH: 27 pg (ref 26.0–34.0)
MCH: 27.4 pg (ref 26.0–34.0)
MCHC: 32.8 g/dL (ref 30.0–36.0)
MCV: 82.3 fL (ref 78.0–100.0)
MCV: 83.3 fL (ref 78.0–100.0)
Platelets: 186 10*3/uL (ref 150–400)
Platelets: 206 10*3/uL (ref 150–400)
RBC: 3.36 MIL/uL — ABNORMAL LOW (ref 4.22–5.81)
RDW: 17 % — ABNORMAL HIGH (ref 11.5–15.5)
WBC: 9.5 10*3/uL (ref 4.0–10.5)

## 2013-02-08 MED ORDER — POTASSIUM CHLORIDE CRYS ER 20 MEQ PO TBCR
40.0000 meq | EXTENDED_RELEASE_TABLET | Freq: Once | ORAL | Status: AC
  Administered 2013-02-08: 40 meq via ORAL
  Filled 2013-02-08: qty 2

## 2013-02-08 NOTE — Progress Notes (Signed)
[facility] Progress Note [place] TEAM 1 - Stepdown ICU Team   [name] WUJ:[id number] DOB: [birth year] DOA: 02/03/2013 PCP: Ginette Otto, MD  Brief narrative: 72 y.o. male admitted for hematochezia. Patient has no abdominal pain. This is his third episode of hematochezia since April 2012. Had colonoscopy for bleeding in April 2012 by Dr. 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 In addition he had a UA that appeared consitent with a UTI   Assessment/Plan:    Lower GI bleed/knownDiverticulosis of colon with hemorrhage -appreciate GI assistance  -NM Bleeding scan was positive (10/22) for ACTIVE bleeding in hepatic flexure- d/w Dr. Narda Rutherford consult IR to see if candidate for repeat coiling procedure  -IR eval- arteriogram 10/22 but no bleeding seen -cont w/ bleeding into 10/23 but slowed -GI rec surgical eval- note that episodes of GIB in 2012 and this admit were BOTH in hepatic flexure which seems to narrow foci of bleeding if surgery anticipated -bloody stool this morning -hgb dropping- cont to follow     Acute post-hemorrhagic anemia -hgb had drifted down to 7.8 from 10.7 and was transfused 2 units PRBCs on 10/21 w/ increase in Hgb to 9.8 -10/22: w/ recurrent bleeding Hgb had drifted back down to 8.0 so was transfused 2 more units -10/23: add'l bleeding overnight but more clots- pt symptomatic with weakness- repeat Hgb has decreased from 9 to 7.9 so was transfused 2 more units -Hgb falling - follow Q 12    ?? UTI (lower urinary tract infection) -urine cx no growth so anbx's were dc'd    Rheumatoid arthritis/ Long-term current use of steroids -clarified home dose Prednisone as 5 mg daily - converted from IV back to PO (10/24) -cont Arava    DVT prophylaxis: SCDs Code Status: Full Family Communication: Patient and wife at  bedside Disposition Plan/Expected LOS: Stepdown  Consultants: Gastroenterology [facility]CS  Procedures: None  Antibiotics: Cipro 10/20 >>> 10/21  HPI/Subjective: Patient alert. Had BM just now- checked by myself- bloody-   Objective: Blood pressure 137/50, pulse 75, temperature 98.4 F (36.9 C), temperature source Oral, resp. rate 16, height 5' 7.5" (1.715 m), weight 78.3 kg (172 lb 9.9 oz), SpO2 97.00%.  Intake/Output Summary (Last 24 hours) at 02/08/13 1541 Last data filed at 02/08/13 0800  Gross per 24 hour  Intake    240 ml  Output    750 ml  Net   -510 ml   Exam: General: No acute respiratory distress-pale/weak Lungs: Clear to auscultation bilaterally without wheezes or crackles, RA Cardiovascular: Regular rate and rhythm without murmur gallop or rub normal S1 and S2, no peripheral edema or JVD Abdomen: Nontender, nondistended, soft, bowel sounds positive, no rebound, no ascites, no appreciable mass Musculoskeletal: No significant cyanosis, clubbing of bilateral lower extremities Neurological: Alert and oriented x 3, moves all extremities x 4 without focal neurological deficits, CN 2-12 intact  Scheduled Meds:  Scheduled Meds: . acetaminophen  1,000 mg Oral BID  . leflunomide  20 mg Oral q morning - 10a  . multivitamin-lutein  1 capsule Oral Daily  . pantoprazole  40 mg Oral Daily  . predniSONE  5 mg Oral Daily  . sodium chloride  3 mL Intravenous Q12H  . traMADol  50 mg Oral BID   Data Reviewed: Basic Metabolic Panel:  Recent Labs Lab 02/03/13 0945 02/04/13 0245 02/06/13 0525 02/08/13 0530  NA 138 142 140 140  K 3.5  4.0 3.9 3.4*  CL 104 112 110 107  CO2 24 25 25 25   GLUCOSE 125* 90 108* 88  BUN 13 15 14 11   CREATININE 0.98 0.92 0.95 0.99  CALCIUM 8.8 7.7* 7.6* 7.8*   Liver Function Tests:  Recent Labs Lab 02/03/13 0945 02/06/13 0525  AST 17 12  ALT 10 6  ALKPHOS 69 44  BILITOT 0.5 0.4  PROT 7.2 4.8*  ALBUMIN 3.2* 2.3*   CBC:  Recent  Labs Lab 02/03/13 0945  02/05/13 2242 02/06/13 1115 02/06/13 2200 02/07/13 0625 02/08/13 0530  WBC 10.3  < > 9.1 8.8 10.1 9.0 7.1  NEUTROABS 6.9  --   --   --   --   --   --   HGB 10.7*  < > 9.0* 7.9* 9.2* 9.2* 8.4*  HCT 34.6*  < > 27.6* 23.8* 27.5* 27.0* 25.6*  MCV 79.7  < > 81.2 81.2 80.9 81.1 82.3  PLT 242  < > 166 159 159 163 186  < > = values in this interval not displayed.   Recent Results (from the past 240 hour(s))  URINE CULTURE     Status: None   Collection Time    02/03/13 11:02 AM      Result Value Range Status   Specimen Description URINE, RANDOM   Final   Special Requests NONE   Final   Culture  Setup Time     Final   Value: 02/03/2013 12:24     Performed at [facility] Count     Final   Value: NO GROWTH     Performed at [facility]   Culture     Final   Value: NO GROWTH     Performed at [facility]   Report Status 02/04/2013 FINAL   Final  MRSA PCR SCREENING     Status: None   Collection Time    02/03/13  5:29 PM      Result Value Range Status   MRSA by PCR NEGATIVE  NEGATIVE Final   Comment:            The GeneXpert MRSA Assay (FDA     approved for NASAL specimens     only), is one component of a     comprehensive MRSA colonization     surveillance program. It is not     intended to diagnose MRSA     infection nor to guide or     monitor treatment for     MRSA infections.     Studies:  Recent x-ray studies have been reviewed in detail by the Attending Physician    Dosha Broshears,MD [phone] 02/08/2013, 3:41 PM

## 2013-02-08 NOTE — Progress Notes (Signed)
[name] [place] 11:44 AM  Subjective: Patient with minimal bright red blood mixed in his stool today but no bowel movement yesterday and just gas and no other complaints we reviewed his history together  Objective: Vital signs stable afebrile no acute distress abdomen is soft nontender hemoglobin slight decrease  Assessment: Right-sided diverticular bleed  Plan: Observe for one more day for signs of recurrent bleeding and patient continuing to think about surgical options for his 3 bleeds in the last 2 years requiring roughly 17units of blood  Clariece Roesler E

## 2013-02-08 NOTE — Progress Notes (Addendum)
  Subjective: He feels good, no Bm and no blood since yesterday AM.  He ate a regular supper and is hoping if all goes well he can go home later today.  Objective: Vital signs in last 24 hours: Temp:  [98 F (36.7 C)-98.9 F (37.2 C)] 98 F (36.7 C) (10/25 0413) Pulse Rate:  [57-64] 57 (10/25 0413) Resp:  [11-23] 17 (10/25 0413) BP: (109-119)/(44-64) 113/61 mmHg (10/25 0413) SpO2:  [95 %-98 %] 95 % (10/25 0413) Weight:  [78.3 kg (172 lb 9.9 oz)] 78.3 kg (172 lb 9.9 oz) (10/25 0413) Last BM Date: 02/07/13 1 stool yesterday, and 2 this Am recorded, but he says they were yesterday.  None today. Afebrile,VSS, HR in the 60 range. H/H down slightly to 8.4/25.6 this AM from 9.2/27 yesterday. Intake/Output from previous day: 10/24 0701 - 10/25 0700 In: 960 [P.O.:960] Out: -  Intake/Output this shift:    General appearance: alert, cooperative and no distress GI: soft, non-tender; bowel sounds normal; no masses,  no organomegaly  Lab Results:   Recent Labs  02/07/13 0625 02/08/13 0530  WBC 9.0 7.1  HGB 9.2* 8.4*  HCT 27.0* 25.6*  PLT 163 186    BMET  Recent Labs  02/06/13 0525 02/08/13 0530  NA 140 140  K 3.9 3.4*  CL 110 107  CO2 25 25  GLUCOSE 108* 88  BUN 14 11  CREATININE 0.95 0.99  CALCIUM 7.6* 7.8*   PT/INR No results found for this basename: LABPROT, INR,  in the last 72 hours   Recent Labs Lab 02/03/13 0945 02/06/13 0525  AST 17 12  ALT 10 6  ALKPHOS 69 44  BILITOT 0.5 0.4  PROT 7.2 4.8*  ALBUMIN 3.2* 2.3*     Lipase  No results found for this basename: lipase     Studies/Results: No results found.  Medications: . acetaminophen  1,000 mg Oral BID  . leflunomide  20 mg Oral q morning - 10a  . multivitamin-lutein  1 capsule Oral Daily  . pantoprazole  40 mg Oral Daily  . potassium chloride  40 mEq Oral Once  . predniSONE  5 mg Oral Daily  . sodium chloride  3 mL Intravenous Q12H  . 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 Dr. Fredia Sorrow  S/p embolization right colic artery 07/2010/arteriorgram 2013  Anemia  Hx of diverticulosis with hemorrhage  Rheumatoid Arthritis on long term steroids  Hx of Nissen fundoplication/Inguinal hernia repairs  Hx of nephrolithiasis   Plan:  Hemodynamically stable, H/H with minimal change over 24 hour period.  Will follow. Dr. Butler Denmark called me after seeing pt and he had another BM with some blood in it.  She is repeating H/H this afternoon.   LOS: 5 days    JENNINGS,WILLARD 02/08/2013  Agree with above. He is asymptomatic.  Noticed a little blood with a BM this AM.  It does not sound like another bleed. He is followed by Dr. Josefa Half.  This is his 3rd bleed in 2 years - total transfused blood for the 3 events is 17 units PRBCs. Wife at the bedside.  Ovidio Kin, MD, [facility] Pager: [phone] Office phone:  [phone]

## 2013-02-09 DIAGNOSIS — IMO0002 Reserved for concepts with insufficient information to code with codable children: Secondary | ICD-10-CM

## 2013-02-09 LAB — CBC
HCT: 24.7 % — ABNORMAL LOW (ref 39.0–52.0)
MCH: 27.3 pg (ref 26.0–34.0)
MCHC: 32.8 g/dL (ref 30.0–36.0)
MCV: 83.2 fL (ref 78.0–100.0)
RBC: 2.97 MIL/uL — ABNORMAL LOW (ref 4.22–5.81)
RDW: 17.8 % — ABNORMAL HIGH (ref 11.5–15.5)

## 2013-02-09 MED ORDER — INFLUENZA VAC SPLIT QUAD 0.5 ML IM SUSP
0.5000 mL | Freq: Once | INTRAMUSCULAR | Status: AC
  Administered 2013-02-09: 0.5 mL via INTRAMUSCULAR
  Filled 2013-02-09: qty 0.5

## 2013-02-09 NOTE — Progress Notes (Signed)
D/c instructions reviewed and discussed with patient and his wife. All questions answered and patient and his wife verbalized understanding.

## 2013-02-09 NOTE — Progress Notes (Addendum)
Physician Discharge Summary  [name] WJX:[id number] DOB: 1940/10/07 DOA: 02/03/2013  PCP: Ginette Otto, MD  Admit date: 02/03/2013 Discharge date: 02/09/2013  Time spent: >45 minutes  Recommendations for Outpatient Follow-up:  1. Iron levels should be checked in a few wks.   Discharge Diagnoses:  Principal Problem:   Diverticulosis of colon with hemorrhage Active Problems:   Rheumatoid arthritis(714.0)   Long-term current use of steroids   Acute post-hemorrhagic anemia Previous history of Iron deficiency anemia  Discharge Condition: stable  Diet recommendation: heart healthy, avoid seeds and small nuts   Filed Weights   02/06/13 0434 02/08/13 0413 02/09/13 0448  Weight: 79 kg (174 lb 2.6 oz) 78.3 kg (172 lb 9.9 oz) 77.6 kg (171 lb 1.2 oz)    History of present illness:  72 y.o. male admitted for hematochezia. Patient has no abdominal pain. This is his third episode of hematochezia since April 2012. Had colonoscopy for bleeding in April 2012 by Dr. 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   Hospital Course:  Lower GI bleed/knownDiverticulosis of colon with hemorrhage  -appreciate GI assistance  -NM Bleeding scan was positive (10/22) for ACTIVE bleeding in hepatic flexure- d/w Dr. Narda Rutherford consult IR to see if candidate for repeat coiling procedure  -IR eval- arteriogram 10/22 but no bleeding seen  -cont w/ bleeding into 10/23 but slowed -GI rec surgical eval- note that episodes of GIB in 2012 and this admit were BOTH in hepatic flexure which seems to narrow foci of bleeding if surgery anticipated  - bleeding has stopped  - advanced to a regular diet   Acute post-hemorrhagic anemia  -hgb had drifted down to 7.8 from 10.7 and was transfused 2 units PRBCs on 10/21 w/ increase in Hgb to 9.8  -10/22: w/ recurrent bleeding Hgb had  drifted back down to 8.0 so was transfused 2 more units  -10/23: add'l bleeding overnight but more clots- pt symptomatic with weakness- repeat Hgb has decreased from 9 to 7.9 so was transfused 2 more units  -BP better and Hgb remains stable for now   ?? UTI (lower urinary tract infection)  -urine cx no growth so anbx's were dc'd   Rheumatoid arthritis/ Long-term current use of steroids  -clarified home dose Prednisone as 5 mg daily - converted from IV back to PO (10/24)  -cont Arava     Consultations:  GI  Gen Surg  Discharge Exam: Filed Vitals:   02/09/13 0838  BP: 127/88  Pulse: 83  Temp: 98.7 F (37.1 C)  Resp: 17    General: AAO x 3 Cardiovascular: RRR, no murmurs Respiratory: CTA b/l   Discharge Instructions      Discharge Orders   Future Orders Complete By Expires   Diet - low sodium heart healthy  As directed    Scheduling Instructions:     Avoid seeds and small nuts -   Increase activity slowly  As directed        Medication List         acetaminophen 650 MG CR tablet  Commonly known as:  TYLENOL  Take 1,300 mg by mouth 2 (two) times daily.     hydrocortisone cream 1 %  Apply 1 application topically as needed (for rash in groin).     ICAPS Caps  Take 1 capsule by mouth daily.     leflunomide 20 MG tablet  Commonly known as:  ARAVA  Take 20 mg by mouth every morning.     omeprazole 20 MG capsule  Commonly known as:  PRILOSEC  Take 20 mg by mouth daily.     predniSONE 5 MG tablet  Commonly known as:  DELTASONE  Take 5 mg by mouth daily.     SIMILASAN ALLERGY EYE RELIEF Soln  Apply 1 drop to eye daily as needed (for allergies).     traMADol 50 MG tablet  Commonly known as:  ULTRAM  Take 50 mg by mouth 2 (two) times daily.       Allergies  Allergen Reactions  . Clindamycin/Lincomycin Hives and Swelling  . Codeine Hives and Swelling  . Penicillins Hives and Swelling      The results of significant diagnostics from this  hospitalization (including imaging, microbiology, ancillary and laboratory) are listed below for reference.    Significant Diagnostic Studies: Nm Gi Blood Loss  02/05/2013   CLINICAL DATA:  Gastrointestinal bleeding  EXAM: NUCLEAR MEDICINE GASTROINTESTINAL BLEEDING SCAN  TECHNIQUE: Sequential abdominal images were obtained following intravenous administration of Tc-85m labeled red blood cells.  COMPARISON:  None.  RADIOPHARMACEUTICALS:  CURIE ULTRATAG TECHNETIUM TC 21M-LABELED RED BLOOD CELLS IV KITmCi Tc-12m in-vitro labeled red cells.  FINDINGS: There in the initial hr of imaging, there is faint uptake in the region of the hepatic flexure. During the 2nd hr min imaging, there is intense uptake within the region of the hepatic flexure of which courses across the location of the transverse colon and descending colon and a pattern consistent with peristalsis of the colon. Physiologic activity in the urinary system is noted. Blood pool activity is also present.  IMPRESSION: The above study is compatible with acute gastrointestinal bleeding within the hepatic flexure of the colon.   Electronically Signed   By: Maryclare Bean M.D.   On: 02/05/2013 15:08   Ir Angiogram Visceral Selective  02/05/2013   CLINICAL DATA:  Lower GI bleed emanating from the hepatic flexure of the colon by nuclear medicine bleeding scan. The patient has a history of right-sided diverticular bleed with prior transcatheter coil embolization.  EXAM: IR ULTRASOUND GUIDANCE VASC ACCESS RIGHT; SELECTIVE VISCERAL ARTERIOGRAPHY  CONTRAST:  CONTRAST 80 mL Omnipaque 300  PROCEDURE: Informed consent was obtained from the patient for selective arteriography and possible embolization. The right groin was sterilely prepped with Betadine and draped. Local anesthesia was provided with 1% lidocaine.  Ultrasound was used to confirm patency of the right common femoral artery. Under ultrasound guidance, the right common femoral artery was accessed  utilizing a micropuncture set. A 5 French vascular sheath was placed.  A 5 French Cobra catheter was introduced into the aorta. This was used to selectively catheterize the superior mesenteric artery. Catheter was advanced over a guidewire in the SMA trunk. Selective arteriography was performed. 100 mcg of intra-arterial a nitroglycerin was administered to the catheter. Additional selective arteriography was then performed. After the procedure, hemostasis was obtained with manual compression and use of a V-Pad after sheath removal.  Complications: None  FINDINGS: SMA arteriography shows normally patent branches supplying small bowel and colon. There is no evidence of active extravasation of contrast material. Examination was focused to the proximal half of the colon. Previously placed hepatic flexure embolization coils are in stable position. No vascular malformations are identified. The venous phase shows normally patent venous drainage.  IMPRESSION: Normal SMA arteriography demonstrating no evidence of active GI bleed. A target vessel for embolization was not identifiable.  PREPERATION AND PROCEDURE MEDICATIONS:  Sedation: 2 mg IV Versed, 50 mcg IV fentanyl.  Total Moderate Sedation Time: 21 minutes.  100 mcg intra arterial nitroglycerin.   Electronically Signed   By: Irish Lack M.D.   On: 02/05/2013 17:15   Ir US Guide Vasc Access Right  02/05/2013   CLINICAL DATA:  Lower GI bleed emanating from the hepatic flexure of the colon by nuclear medicine bleeding scan. The patient has a history of right-sided diverticular bleed with prior transcatheter coil embolization.  EXAM: IR ULTRASOUND GUIDANCE VASC ACCESS RIGHT; SELECTIVE VISCERAL ARTERIOGRAPHY  CONTRAST:  CONTRAST 80 mL Omnipaque 300  PROCEDURE: Informed consent was obtained from the patient for selective arteriography and possible embolization. The right groin was sterilely prepped with Betadine and draped. Local anesthesia was provided with 1%  lidocaine.  Ultrasound was used to confirm patency of the right common femoral artery. Under ultrasound guidance, the right common femoral artery was accessed utilizing a micropuncture set. A 5 French vascular sheath was placed.  A 5 French Cobra catheter was introduced into the aorta. This was used to selectively catheterize the superior mesenteric artery. Catheter was advanced over a guidewire in the SMA trunk. Selective arteriography was performed. 100 mcg of intra-arterial a nitroglycerin was administered to the catheter. Additional selective arteriography was then performed. After the procedure, hemostasis was obtained with manual compression and use of a V-Pad after sheath removal.  Complications: None  FINDINGS: SMA arteriography shows normally patent branches supplying small bowel and colon. There is no evidence of active extravasation of contrast material. Examination was focused to the proximal half of the colon. Previously placed hepatic flexure embolization coils are in stable position. No vascular malformations are identified. The venous phase shows normally patent venous drainage.  IMPRESSION: Normal SMA arteriography demonstrating no evidence of active GI bleed. A target vessel for embolization was not identifiable.  PREPERATION AND PROCEDURE MEDICATIONS: Sedation: 2 mg IV Versed, 50 mcg IV fentanyl.  Total Moderate Sedation Time: 21 minutes.  100 mcg intra arterial nitroglycerin.   Electronically Signed   By: Irish Lack M.D.   On: 02/05/2013 17:15    Microbiology: Recent Results (from the past 240 hour(s))  URINE CULTURE     Status: None   Collection Time    02/03/13 11:02 AM      Result Value Range Status   Specimen Description URINE, RANDOM   Final   Special Requests NONE   Final   Culture  Setup Time     Final   Value: 02/03/2013 12:24     Performed at [facility] Count     Final   Value: NO GROWTH     Performed at [facility]   Culture     Final    Value: NO GROWTH     Performed at [facility]   Report Status 02/04/2013 FINAL   Final  MRSA PCR SCREENING     Status: None   Collection Time    02/03/13  5:29 PM      Result Value Range Status   MRSA by PCR NEGATIVE  NEGATIVE Final   Comment:            The GeneXpert MRSA Assay (FDA     approved for NASAL specimens     only), is one component of a     comprehensive MRSA colonization     surveillance program. It is not     intended to diagnose MRSA  infection nor to guide or     monitor treatment for     MRSA infections.     Labs: Basic Metabolic Panel:  Recent Labs Lab 02/03/13 0945 02/04/13 0245 02/06/13 0525 02/08/13 0530  NA 138 142 140 140  K 3.5 4.0 3.9 3.4*  CL 104 112 110 107  CO2 24 25 25 25   GLUCOSE 125* 90 108* 88  BUN 13 15 14 11   CREATININE 0.98 0.92 0.95 0.99  CALCIUM 8.8 7.7* 7.6* 7.8*   Liver Function Tests:  Recent Labs Lab 02/03/13 0945 02/06/13 0525  AST 17 12  ALT 10 6  ALKPHOS 69 44  BILITOT 0.5 0.4  PROT 7.2 4.8*  ALBUMIN 3.2* 2.3*   No results found for this basename: LIPASE, AMYLASE,  in the last 168 hours No results found for this basename: AMMONIA,  in the last 168 hours CBC:  Recent Labs Lab 02/03/13 0945  02/06/13 2200 02/07/13 0625 02/08/13 0530 02/08/13 1805 02/09/13 0404  WBC 10.3  < > 10.1 9.0 7.1 9.5 7.5  NEUTROABS 6.9  --   --   --   --   --   --   HGB 10.7*  < > 9.2* 9.2* 8.4* 9.2* 8.1*  HCT 34.6*  < > 27.5* 27.0* 25.6* 28.0* 24.7*  MCV 79.7  < > 80.9 81.1 82.3 83.3 83.2  PLT 242  < > 159 163 186 206 182  < > = values in this interval not displayed. Cardiac Enzymes: No results found for this basename: CKTOTAL, CKMB, CKMBINDEX, TROPONINI,  in the last 168 hours BNP: BNP (last 3 results) No results found for this basename: PROBNP,  in the last 8760 hours CBG: No results found for this basename: GLUCAP,  in the last 168 hours     Signed:  Teana Lindahl  [facility] 02/09/2013, 10:01  AM

## 2013-02-09 NOTE — Progress Notes (Signed)
  Subjective: He is doing well, minimal blood in stools yesterday, not his GI bleed type blood in stools. He feels fine, he has never had pain with this.  Reports bleeding on and off since he was a teen.  Objective: Vital signs in last 24 hours: Temp:  [98.4 F (36.9 C)-98.9 F (37.2 C)] 98.7 F (37.1 C) (10/26 0448) Pulse Rate:  [59-83] 83 (10/26 0838) Resp:  [17-19] 17 (10/26 0838) BP: (117-135)/(52-88) 127/88 mmHg (10/26 0838) SpO2:  [97 %-99 %] 99 % (10/26 0838) Weight:  [77.6 kg (171 lb 1.2 oz)] 77.6 kg (171 lb 1.2 oz) (10/26 0448) Last BM Date: 02/08/13 3 stools recorded yesterday,  Afebrile, VSS HR 59-83 H/h stable  HBG  7.9  9.2  9.2  8.4  9.2  8.1(this AM) Intake/Output from previous day: 10/25 0701 - 10/26 0700 In: -  Out: 1525 [Urine:1525] Intake/Output this shift: Total I/O In: 240 [P.O.:240] Out: -   General appearance: alert, cooperative and no distress GI: soft, non-tender; bowel sounds normal; no masses,  no organomegaly  Lab Results:   Recent Labs  02/08/13 1805 02/09/13 0404  WBC 9.5 7.5  HGB 9.2* 8.1*  HCT 28.0* 24.7*  PLT 206 182    BMET  Recent Labs  02/08/13 0530  NA 140  K 3.4*  CL 107  CO2 25  GLUCOSE 88  BUN 11  CREATININE 0.99  CALCIUM 7.8*   PT/INR No results found for this basename: LABPROT, INR,  in the last 72 hours   Recent Labs Lab 02/03/13 0945 02/06/13 0525  AST 17 12  ALT 10 6  ALKPHOS 69 44  BILITOT 0.5 0.4  PROT 7.2 4.8*  ALBUMIN 3.2* 2.3*     Lipase  No results found for this basename: lipase     Studies/Results: No results found.  Medications: . acetaminophen  1,000 mg Oral BID  . leflunomide  20 mg Oral q morning - 10a  . multivitamin-lutein  1 capsule Oral Daily  . pantoprazole  40 mg Oral Daily  . predniSONE  5 mg Oral Daily  . sodium chloride  3 mL Intravenous Q12H  . 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 Dr. Fredia [name]  S/p embolization right colic artery 07/2010/arteriorgram 2013  Anemia  Hx of diverticulosis with hemorrhage  Rheumatoid Arthritis on long term steroids  Hx of Nissen fundoplication/Inguinal hernia repairs  Hx of nephrolithiasis   Plan: Dr. Ewing [name] was in while i was talking with the patient, and his wife.  He thinks that if his HCT remains stable he can go and follow up with Dr [name].  He has seen Dr. Abbey [name] from our office for surgery in the past and would like to follow up with him.  Dr.Magod suggested possible repeat colonoscopy and then discuss elective surgery.    LOS: 6 days    [name],[name] 02/09/2013  Agree with above. The patient is heading home.  He will follow up with Dr. Abbey [name] in our office. He looks good.  Ovidio Kin, MD, [facility] Surgery Pager: [phone] Office phone:  [phone]

## 2013-02-09 NOTE — Progress Notes (Signed)
[name] 10:44 AM  Subjective: Patient is still saw a touch of bright red blood in last bowel movement but is doing fine and no new complaint  Objective: Vital signs stable afebrile no acute distress abdomen is soft nontender hemoglobin seemed to increase and then decrease back to baseline  Assessment: Diverticular bleed  Plan: Okay with me to go home this evening if no further bleeding today with close outpatient followup with Dr. Letitia Libra his gastroenterology and his primary care physician and consider surgical options in the future and possibly even repeat outpatient colonoscopy in the meantime just to be sure and happy to see back when necessary [facility]

## 2013-05-14 ENCOUNTER — Encounter (INDEPENDENT_AMBULATORY_CARE_PROVIDER_SITE_OTHER): Payer: Self-pay | Admitting: General Surgery

## 2013-05-14 ENCOUNTER — Encounter (INDEPENDENT_AMBULATORY_CARE_PROVIDER_SITE_OTHER): Payer: Self-pay

## 2013-05-14 ENCOUNTER — Ambulatory Visit (INDEPENDENT_AMBULATORY_CARE_PROVIDER_SITE_OTHER): Payer: Medicare Other | Admitting: General Surgery

## 2013-05-14 VITALS — BP 160/92 | HR 62 | Temp 98.2°F | Resp 14 | Ht 67.0 in | Wt 181.2 lb

## 2013-05-14 DIAGNOSIS — K5731 Diverticulosis of large intestine without perforation or abscess with bleeding: Secondary | ICD-10-CM

## 2013-05-14 NOTE — Progress Notes (Signed)
Patient ID: [name], male   DOB: 1941/03/31, 73 y.o.   MRN: [id number]  Chief Complaint  Patient presents with  . Follow-up    reck recurrent diverticulitis    HPI [name] is a 73 y.o. male.   HPI  [name] is a gentleman with multiple lower gastric or intestinal bleeds due to diverticular disease.  I saw him 2 years ago because of this. My recommendation was that if he had another bleed, he should have surgery while he was in the hospital.  He has a colonoscopy which demonstrates pan diverticulosis.  He was admitted to the hospital October 2014 with another lower GI bleed. He has had arteriogram demonstrating the bleed to be near the hepatic flexure and the coil was placed in the small vessel at that time.  He has had other bleeds, however, that were unable to be localized. He is here to discuss surgery.  Because of the one bleeder was localized to the hepatic flexure, people in the hospital recommend he have a right hemicolectomy.  Past Medical History  Diagnosis Date  . Bursitis     chronic hip pain  . BPH (benign prostatic hyperplasia)   . Blood transfusion     " no reaction from transfusion"  . GERD (gastroesophageal reflux disease)   . H/O hiatal hernia   . Spondylisthesis   . Osteoporosis   . Bruises easily   . Diverticulitis   . Pneumonia   . Rash     GROIN - ITCHING PAST 3 MONTHS  . History of kidney stones   . History of GI bleed     "vessels burst in colon" x2  . Rheumatoid arthritis(714.0)     27 years  . Osteoarthritis   . Disc disease, degenerative, lumbar or lumbosacral     Chronic back pain now  . History of vertigo     Past Surgical History  Procedure Laterality Date  . Total knee arthroplasty  2003    Bilateral  . Nissen fundoplication      about 5277, pt not sure records not currently available  . Lithotripsy for nephrolithiasis      Pt and wife not sure when but here in [place]  . Lysis of adhesion, small bowel resection  2009  .  Hernia repair    . Inguinal hernia repair  2007    Bilateral   . Ventral hernia repair with mesh    . Esophagogastroduodenoscopy N/A 10/01/2012    Procedure: ESOPHAGOGASTRODUODENOSCOPY (EGD);  Surgeon: Garlan Fair, MD;  Location: [facility];  Service: Endoscopy;  Laterality: N/A;  . Balloon dilation N/A 10/01/2012    Procedure: BALLOON DILATION;  Surgeon: Garlan Fair, MD;  Location: [facility];  Service: Endoscopy;  Laterality: N/A;  . Cystoscopy with ureteroscopy Right 12/05/2012    Procedure: CYSTOSCOPY WITH RIGHT URETEROSCOPY AND STONE EXTRACTION;  Surgeon: Malka So, MD;  Location: [facility];  Service: Urology;  Laterality: Right;  . Transurethral prostatectomy with gyrus instruments N/A 12/05/2012    Procedure: TRANSURETHRAL PROSTATECTOMY WITH GYRUS INSTRUMENTS;  Surgeon: Malka So, MD;  Location: [facility];  Service: Urology;  Laterality: N/A;    History reviewed. No pertinent family history.  Social History History  Substance Use Topics  . Smoking status: Never Smoker   . Smokeless tobacco: Never Used  . Alcohol Use: No    Allergies  Allergen Reactions  . Clindamycin/Lincomycin Hives and Swelling  . Codeine Hives and Swelling  .  Penicillins Hives and Swelling    Current Outpatient Prescriptions  Medication Sig Dispense Refill  . acetaminophen (TYLENOL) 650 MG CR tablet Take 1,300 mg by mouth 2 (two) times daily.      . Homeopathic Products (SIMILASAN ALLERGY EYE RELIEF) SOLN Apply 1 drop to eye daily as needed (for allergies).      . leflunomide (ARAVA) 20 MG tablet Take 20 mg by mouth every morning.       . omeprazole (PRILOSEC) 20 MG capsule Take 20 mg by mouth daily.        . predniSONE (DELTASONE) 5 MG tablet Take 5 mg by mouth daily.      . traMADol (ULTRAM) 50 MG tablet Take 50 mg by mouth 2 (two) times daily.      . Multiple Vitamins-Minerals (ICAPS) CAPS Take 1 capsule by mouth daily.       No current facility-administered medications for this visit.     Review of Systems Review of Systems  Constitutional: Negative.   HENT: Positive for trouble swallowing.   Respiratory: Negative.   Cardiovascular: Negative.   Gastrointestinal: Positive for blood in stool.  Genitourinary: Negative.   Musculoskeletal: Positive for arthralgias.  Neurological: Negative.     Blood pressure 160/92, pulse 62, temperature 98.2 F (36.8 C), temperature source Temporal, resp. rate 14, height 5' 7" (1.702 m), weight 181 lb 3.2 oz (82.192 kg).  Physical Exam Physical Exam  Constitutional: He appears well-developed and well-nourished. No distress.  HENT:  Head: Normocephalic and atraumatic.  Eyes: No scleral icterus.  Cardiovascular: Normal rate and regular rhythm.   Pulmonary/Chest: Effort normal and breath sounds normal.  Abdominal: Soft. He exhibits no distension and no mass. There is no tenderness.  Long midline scar. Multiple small scars laterally.  Musculoskeletal: He exhibits no edema.  Neurological: He is alert.  Skin: Skin is warm and dry.    Data Reviewed Notes in Epic.  Assessment    Recurrent lower GI bleeds felt to be secondary to diverticular disease. He has a complex history of abdominal surgery including an open Nissen fundoplication, exploratory laparotomy and small bowel resection, laparoscopic ventral hernia repair with mesh.  He also takes prednisone and Arava for rheumatoid arthritis. The Arava has been reported to lead to problems with wound healing.     Plan    I recommended a total abdominal colectomy with ileal rectal anastomosis possible ileostomy.  I am not confident that all of his bleeding has come from the right colon area. I explained my reasoning to him for this. I told him I would be to speak of his rheumatologist about his medications and see if we could stop the Arava.  I  explained the procedure and risks of colon resection.  Risks include but are not limited to bleeding, infection, wound problems, anesthesia,  anastomotic leak, need for colostomy, injury to intraabominal organs (such as intestine, spleen, kidney, bladder, ureter, etc.), ileus, irregular bowel habits.  He seems to understand.  He is going to go home and discuss it with his wife. He will call us back. I did offer him a second opinion if he did not want to have a total abdominal colectomy.       Concettina Leth J 05/14/2013, 12:29 PM    

## 2013-05-14 NOTE — Patient Instructions (Signed)
[facility] [place] [facility]   Call us when you decide on surgery.  ONE-DAY (1) PRE-OP HOME COLON PREP INSTRUCTIONS: ** MIRALAX / GATORADE PREP **  Fill the two prescriptions at a pharmacy of your choice.  You must follow the instructions below carefully.  If you have questions or problems, please call and speak to someone in the clinic department at our office:   [phone].  MIRALAX - GATORADE -- DULCOLAX TABS:   Fill the prescriptions for MIRALAX  (255 gm bottle)    In addition, purchase four (4) DULCOLAX TABLETS (no prescription required), and one 64 oz GATORADE.  (Do NOT purchase red Gatorade; any other flavor is acceptable).  ANITIBIOTICS:   There will be 2 different antibiotics.     Take both prescriptions THE AFTERNOON BEFORE your surgery, at the times written on the bottles.  INSTRUCTIONS: 1. Five days prior to your procedure do not eat nuts, popcorn, or fruit with seeds.  Stop all fiber supplements such as Metamucil, Citrucel, etc.  2. The day before your procedure: o 6:00am:  take (4) Dulcolax tablets.  You should remain on clear liquids for the entire day.   CLEAR LIQUIDS: clear bouillon, broth, jello (NOT RED), black coffee, tea, soda, etc o 10:00am:  add the bottle of MiraLax to the 64-oz bottle of Gatorade, and dissolve.  Begin drinking the Gatorade mixture until gone (8 oz every 15-30 minutes).  Continue clear liquids until midnight (or bedtime). o Take the antibiotics at the times instructed on the bottles.  3. The day of your procedure:   Do not eat or drink ANYTHING after midnight before your surgery.     If you take Heart or Blood Pressure medicine, ask the pre-op nurses about these during your preop appointment.   Further pre-operative instructions will be given to you from the hospital.   Expect to be contacted 5-7 days before your surgery.

## 2013-05-16 ENCOUNTER — Encounter (INDEPENDENT_AMBULATORY_CARE_PROVIDER_SITE_OTHER): Payer: Self-pay | Admitting: General Surgery

## 2013-05-16 ENCOUNTER — Other Ambulatory Visit (INDEPENDENT_AMBULATORY_CARE_PROVIDER_SITE_OTHER): Payer: Self-pay | Admitting: General Surgery

## 2013-05-16 NOTE — Progress Notes (Unsigned)
Patient ID: [name], male   DOB: Apr 20, 1940, 73 y.o.   MRN: [id number] I spoke with his rheumatologist yesterday, Dr. Gavin Pound.  Given that the [place]  has negative effects on the healing process , we discussed stopping this. She states he can remain in the system for some time.  Would like to stop  The medication at least 2 weeks before the surgery. She also told me that giving him cholestyramine for one week might help make sure it is out of his system. He has called back and wants to schedule the surgery. We will inform Dr. Trudie Reed that the surgery date is 06/10/2013. She can then work with [name] regarding other medications he may need to help with his rheumatoid arthritis.

## 2013-05-19 ENCOUNTER — Telehealth (INDEPENDENT_AMBULATORY_CARE_PROVIDER_SITE_OTHER): Payer: Self-pay

## 2013-05-19 NOTE — Telephone Encounter (Signed)
Pt made aware to stop his [place] today.  Dr. Trudie Reed' office will call him to discuss alternative treatment.

## 2013-05-19 NOTE — Telephone Encounter (Signed)
LM with pt's wife to have him call for pre-surgery instructions.  He will need to call his rheumatologist, Gavin Pound, MD, today, and let her know he is stopping his [place] for his rheumatoid arthritis.  She will find alternative medications.  Operation is scheduled for 06/10/13.

## 2013-05-30 ENCOUNTER — Encounter (HOSPITAL_COMMUNITY): Payer: Self-pay | Admitting: Pharmacy Technician

## 2013-06-02 NOTE — Patient Instructions (Addendum)
FOLLOW YOUR BOWEL PREP INSTRUCTIONS DAY BEFORE YOUR SURGERY - INSTRUCTIONS FROM DR. ROSENBOWER'S OFFICE.  YOUR SURGERY IS SCHEDULED AT [facility]  ON:  Tuesday  2/24   REPORT TO  SHORT STAY CENTER AT:  7:30 AM      PHONE # FOR SHORT STAY IS [phone]  DO NOT EAT OR DRINK ANYTHING AFTER MIDNIGHT THE NIGHT BEFORE YOUR SURGERY.  YOU MAY BRUSH YOUR TEETH, RINSE OUT YOUR MOUTH--BUT NO WATER, NO FOOD, NO CHEWING GUM, NO MINTS, NO CANDIES, NO CHEWING TOBACCO.  PLEASE TAKE THE FOLLOWING MEDICATIONS THE AM OF YOUR SURGERY WITH A FEW SIPS OF WATER:   OMEPRAZOLE, PREDNISONE, TRAMADOL.   DO NOT BRING VALUABLES, MONEY, CREDIT CARDS.  DO NOT WEAR JEWELRY, MAKE-UP, NAIL POLISH AND NO METAL PINS OR CLIPS IN YOUR HAIR. CONTACT LENS, DENTURES / PARTIALS, GLASSES SHOULD NOT BE WORN TO SURGERY AND IN MOST CASES-HEARING AIDS WILL NEED TO BE REMOVED.  BRING YOUR GLASSES CASE, ANY EQUIPMENT NEEDED FOR YOUR CONTACT LENS. FOR PATIENTS ADMITTED TO THE HOSPITAL--CHECK OUT TIME THE DAY OF DISCHARGE IS 11:00 AM.  ALL INPATIENT ROOMS ARE PRIVATE - WITH BATHROOM, TELEPHONE, TELEVISION AND WIFI INTERNET.                                                    PLEASE READ OVER ANY  FACT SHEETS THAT YOU WERE GIVEN: BLOOD TRANSFUSION INFORMATION  FAILURE TO FOLLOW THESE INSTRUCTIONS MAY RESULT IN THE CANCELLATION OF YOUR SURGERY. PLEASE BE AWARE THAT YOU MAY NEED ADDITIONAL BLOOD DRAWN DAY OF YOUR SURGERY  PATIENT SIGNATURE_________________________________

## 2013-06-03 ENCOUNTER — Encounter (HOSPITAL_COMMUNITY): Payer: Self-pay

## 2013-06-03 ENCOUNTER — Encounter (HOSPITAL_COMMUNITY)
Admission: RE | Admit: 2013-06-03 | Discharge: 2013-06-03 | Disposition: A | Discharge status: Home / Self Care | Payer: Medicare Other | Source: Ambulatory Visit | Attending: General Surgery | Admitting: General Surgery

## 2013-06-03 ENCOUNTER — Ambulatory Visit (HOSPITAL_COMMUNITY)
Admission: RE | Admit: 2013-06-03 | Discharge: 2013-06-03 | Disposition: A | Discharge status: Home / Self Care | Payer: Medicare Other | Source: Ambulatory Visit | Attending: General Surgery | Admitting: General Surgery

## 2013-06-03 DIAGNOSIS — Z01810 Encounter for preprocedural cardiovascular examination: Secondary | ICD-10-CM | POA: Insufficient documentation

## 2013-06-03 DIAGNOSIS — Z01818 Encounter for other preprocedural examination: Secondary | ICD-10-CM | POA: Insufficient documentation

## 2013-06-03 DIAGNOSIS — J449 Chronic obstructive pulmonary disease, unspecified: Secondary | ICD-10-CM | POA: Insufficient documentation

## 2013-06-03 DIAGNOSIS — Z01812 Encounter for preprocedural laboratory examination: Secondary | ICD-10-CM | POA: Insufficient documentation

## 2013-06-03 DIAGNOSIS — J4489 Other specified chronic obstructive pulmonary disease: Secondary | ICD-10-CM | POA: Insufficient documentation

## 2013-06-03 HISTORY — DX: Esophageal obstruction: K22.2

## 2013-06-03 LAB — COMPREHENSIVE METABOLIC PANEL
ALBUMIN: 3.4 g/dL — AB (ref 3.5–5.2)
ALT: 10 U/L (ref 0–53)
AST: 16 U/L (ref 0–37)
Alkaline Phosphatase: 63 U/L (ref 39–117)
BILIRUBIN TOTAL: 0.4 mg/dL (ref 0.3–1.2)
BUN: 13 mg/dL (ref 6–23)
CHLORIDE: 102 meq/L (ref 96–112)
CO2: 27 meq/L (ref 19–32)
Calcium: 9.4 mg/dL (ref 8.4–10.5)
Creatinine, Ser: 0.96 mg/dL (ref 0.50–1.35)
GFR calc Af Amer: >90 mL/min (ref >90)
GFR, EST NON AFRICAN AMERICAN: 81 mL/min — AB (ref >90)
Glucose, Bld: 95 mg/dL (ref 70–99)
POTASSIUM: 4.4 meq/L (ref 3.7–5.3)
Sodium: 140 mEq/L (ref 137–147)
Total Protein: 7.5 g/dL (ref 6.0–8.3)

## 2013-06-03 LAB — CBC WITH DIFFERENTIAL/PLATELET
BASOS PCT: 2 % — AB (ref 0–1)
Basophils Absolute: 0.1 10*3/uL (ref 0.0–0.1)
EOS PCT: 5 % (ref 0–5)
Eosinophils Absolute: 0.4 10*3/uL (ref 0.0–0.7)
HCT: 33.2 % — ABNORMAL LOW (ref 39.0–52.0)
HEMOGLOBIN: 9.4 g/dL — AB (ref 13.0–17.0)
LYMPHS ABS: 1.5 10*3/uL (ref 0.7–4.0)
LYMPHS PCT: 21 % (ref 12–46)
MCH: 19.7 pg — ABNORMAL LOW (ref 26.0–34.0)
MCHC: 28.3 g/dL — ABNORMAL LOW (ref 30.0–36.0)
MCV: 69.5 fL — AB (ref 78.0–100.0)
Monocytes Absolute: 1.1 10*3/uL — ABNORMAL HIGH (ref 0.1–1.0)
Monocytes Relative: 16 % — ABNORMAL HIGH (ref 3–12)
Neutro Abs: 4 10*3/uL (ref 1.7–7.7)
Neutrophils Relative %: 56 % (ref 43–77)
Platelets: 293 10*3/uL (ref 150–400)
RBC: 4.78 MIL/uL (ref 4.22–5.81)
RDW: 18.7 % — ABNORMAL HIGH (ref 11.5–15.5)
WBC: 7.1 10*3/uL (ref 4.0–10.5)

## 2013-06-03 LAB — PROTIME-INR
INR: 1.07 (ref 0.00–1.49)
PROTHROMBIN TIME: 13.7 s (ref 11.6–15.2)

## 2013-06-03 LAB — ABO/RH: ABO/RH(D): A NEG

## 2013-06-03 NOTE — Pre-Procedure Instructions (Signed)
EKG AND CXR WERE DONE TODAY - PREOP AT [facility]. 

## 2013-06-10 ENCOUNTER — Encounter (HOSPITAL_COMMUNITY)
Admission: RE | Disposition: A | Discharge status: Home / Self Care | Payer: Self-pay | Source: Ambulatory Visit | Attending: General Surgery

## 2013-06-10 ENCOUNTER — Encounter (HOSPITAL_COMMUNITY): Payer: Medicare Other | Admitting: Anesthesiology

## 2013-06-10 ENCOUNTER — Encounter (HOSPITAL_COMMUNITY): Payer: Self-pay | Admitting: *Deleted

## 2013-06-10 ENCOUNTER — Inpatient Hospital Stay (HOSPITAL_COMMUNITY)
Admission: RE | Admit: 2013-06-10 | Discharge: 2013-06-18 | DRG: 330 | Disposition: A | Discharge status: Home / Self Care | Payer: Medicare Other | Source: Ambulatory Visit | Attending: General Surgery | Admitting: General Surgery

## 2013-06-10 ENCOUNTER — Inpatient Hospital Stay (HOSPITAL_COMMUNITY): Payer: Medicare Other | Admitting: Anesthesiology

## 2013-06-10 DIAGNOSIS — M810 Age-related osteoporosis without current pathological fracture: Secondary | ICD-10-CM | POA: Diagnosis present

## 2013-06-10 DIAGNOSIS — K660 Peritoneal adhesions (postprocedural) (postinfection): Secondary | ICD-10-CM | POA: Diagnosis present

## 2013-06-10 DIAGNOSIS — Z9049 Acquired absence of other specified parts of digestive tract: Secondary | ICD-10-CM

## 2013-06-10 DIAGNOSIS — R030 Elevated blood-pressure reading, without diagnosis of hypertension: Secondary | ICD-10-CM | POA: Diagnosis present

## 2013-06-10 DIAGNOSIS — K929 Disease of digestive system, unspecified: Secondary | ICD-10-CM | POA: Diagnosis not present

## 2013-06-10 DIAGNOSIS — N400 Enlarged prostate without lower urinary tract symptoms: Secondary | ICD-10-CM | POA: Diagnosis present

## 2013-06-10 DIAGNOSIS — K219 Gastro-esophageal reflux disease without esophagitis: Secondary | ICD-10-CM | POA: Diagnosis present

## 2013-06-10 DIAGNOSIS — R109 Unspecified abdominal pain: Secondary | ICD-10-CM | POA: Diagnosis present

## 2013-06-10 DIAGNOSIS — K5730 Diverticulosis of large intestine without perforation or abscess without bleeding: Secondary | ICD-10-CM

## 2013-06-10 DIAGNOSIS — IMO0002 Reserved for concepts with insufficient information to code with codable children: Secondary | ICD-10-CM

## 2013-06-10 DIAGNOSIS — Z87442 Personal history of urinary calculi: Secondary | ICD-10-CM

## 2013-06-10 DIAGNOSIS — D500 Iron deficiency anemia secondary to blood loss (chronic): Secondary | ICD-10-CM | POA: Diagnosis present

## 2013-06-10 DIAGNOSIS — K5731 Diverticulosis of large intestine without perforation or abscess with bleeding: Secondary | ICD-10-CM | POA: Diagnosis present

## 2013-06-10 DIAGNOSIS — K449 Diaphragmatic hernia without obstruction or gangrene: Secondary | ICD-10-CM | POA: Diagnosis present

## 2013-06-10 DIAGNOSIS — G8929 Other chronic pain: Secondary | ICD-10-CM | POA: Diagnosis present

## 2013-06-10 DIAGNOSIS — M069 Rheumatoid arthritis, unspecified: Secondary | ICD-10-CM | POA: Diagnosis present

## 2013-06-10 DIAGNOSIS — Z96659 Presence of unspecified artificial knee joint: Secondary | ICD-10-CM

## 2013-06-10 DIAGNOSIS — K5733 Diverticulitis of large intestine without perforation or abscess with bleeding: Principal | ICD-10-CM | POA: Diagnosis present

## 2013-06-10 DIAGNOSIS — R112 Nausea with vomiting, unspecified: Secondary | ICD-10-CM | POA: Diagnosis not present

## 2013-06-10 DIAGNOSIS — K560 Paralytic ileus: Secondary | ICD-10-CM | POA: Diagnosis not present

## 2013-06-10 HISTORY — PX: COLECTOMY: SHX59

## 2013-06-10 LAB — PREPARE RBC (CROSSMATCH)

## 2013-06-10 SURGERY — COLECTOMY, TOTAL
Anesthesia: General | Site: Abdomen

## 2013-06-10 MED ORDER — HYDROMORPHONE HCL PF 1 MG/ML IJ SOLN
0.2500 mg | INTRAMUSCULAR | Status: DC | PRN
  Administered 2013-06-10 (×4): 0.5 mg via INTRAVENOUS

## 2013-06-10 MED ORDER — CISATRACURIUM BESYLATE (PF) 10 MG/5ML IV SOLN
INTRAVENOUS | Status: DC | PRN
  Administered 2013-06-10 (×2): 3 mg via INTRAVENOUS
  Administered 2013-06-10 (×2): 2 mg via INTRAVENOUS
  Administered 2013-06-10: 8 mg via INTRAVENOUS

## 2013-06-10 MED ORDER — EPHEDRINE SULFATE 50 MG/ML IJ SOLN
INTRAMUSCULAR | Status: DC | PRN
  Administered 2013-06-10: 15 mg via INTRAVENOUS

## 2013-06-10 MED ORDER — CISATRACURIUM BESYLATE 20 MG/10ML IV SOLN
INTRAVENOUS | Status: AC
  Filled 2013-06-10: qty 10

## 2013-06-10 MED ORDER — DIPHENHYDRAMINE HCL 50 MG/ML IJ SOLN: 12.5000 mg | Freq: Four times a day (QID) | INTRAMUSCULAR | Status: DC | PRN

## 2013-06-10 MED ORDER — NEOSTIGMINE METHYLSULFATE 1 MG/ML IJ SOLN
INTRAMUSCULAR | Status: DC | PRN
  Administered 2013-06-10: 5 mg via INTRAVENOUS

## 2013-06-10 MED ORDER — MORPHINE SULFATE (PF) 1 MG/ML IV SOLN
INTRAVENOUS | Status: AC
  Filled 2013-06-10: qty 25

## 2013-06-10 MED ORDER — METRONIDAZOLE IN NACL 5-0.79 MG/ML-% IV SOLN
INTRAVENOUS | Status: AC
  Filled 2013-06-10: qty 100

## 2013-06-10 MED ORDER — FENTANYL CITRATE 0.05 MG/ML IJ SOLN
INTRAMUSCULAR | Status: AC
  Filled 2013-06-10: qty 5

## 2013-06-10 MED ORDER — PREDNISONE 5 MG PO TABS
5.0000 mg | ORAL_TABLET | Freq: Every day | ORAL | Status: DC
  Administered 2013-06-11 – 2013-06-18 (×8): 5 mg via ORAL
  Filled 2013-06-10 (×10): qty 1

## 2013-06-10 MED ORDER — KCL-LACTATED RINGERS-D5W 20 MEQ/L IV SOLN
INTRAVENOUS | Status: DC
  Administered 2013-06-10 – 2013-06-14 (×8): via INTRAVENOUS
  Administered 2013-06-14 – 2013-06-15 (×2): 75 mL/h via INTRAVENOUS
  Administered 2013-06-15 – 2013-06-17 (×3): via INTRAVENOUS
  Filled 2013-06-10 (×26): qty 1000

## 2013-06-10 MED ORDER — DEXAMETHASONE SODIUM PHOSPHATE 10 MG/ML IJ SOLN
INTRAMUSCULAR | Status: DC | PRN
  Administered 2013-06-10: 10 mg via INTRAVENOUS

## 2013-06-10 MED ORDER — ONDANSETRON HCL 4 MG/2ML IJ SOLN
4.0000 mg | Freq: Four times a day (QID) | INTRAMUSCULAR | Status: DC | PRN
  Administered 2013-06-12: 4 mg via INTRAVENOUS
  Filled 2013-06-10: qty 2

## 2013-06-10 MED ORDER — SODIUM CHLORIDE 0.9 % IJ SOLN: 9.0000 mL | INTRAMUSCULAR | Status: DC | PRN

## 2013-06-10 MED ORDER — SODIUM CHLORIDE 0.9 % IJ SOLN
INTRAMUSCULAR | Status: AC
  Filled 2013-06-10: qty 10

## 2013-06-10 MED ORDER — NALOXONE HCL 0.4 MG/ML IJ SOLN: 0.4000 mg | INTRAMUSCULAR | Status: DC | PRN

## 2013-06-10 MED ORDER — METRONIDAZOLE IN NACL 5-0.79 MG/ML-% IV SOLN
500.0000 mg | INTRAVENOUS | Status: AC
Start: 2013-06-10 — End: 2013-06-10
  Administered 2013-06-10: 500 mg via INTRAVENOUS
  Filled 2013-06-10: qty 100

## 2013-06-10 MED ORDER — FENTANYL CITRATE 0.05 MG/ML IJ SOLN
INTRAMUSCULAR | Status: DC | PRN
  Administered 2013-06-10: 25 ug via INTRAVENOUS
  Administered 2013-06-10 (×3): 50 ug via INTRAVENOUS
  Administered 2013-06-10: 100 ug via INTRAVENOUS
  Administered 2013-06-10: 25 ug via INTRAVENOUS
  Administered 2013-06-10: 50 ug via INTRAVENOUS

## 2013-06-10 MED ORDER — PROPOFOL 10 MG/ML IV BOLUS
INTRAVENOUS | Status: AC
  Filled 2013-06-10: qty 20

## 2013-06-10 MED ORDER — PROPOFOL 10 MG/ML IV BOLUS
INTRAVENOUS | Status: DC | PRN
  Administered 2013-06-10: 200 mg via INTRAVENOUS

## 2013-06-10 MED ORDER — ONDANSETRON HCL 4 MG/2ML IJ SOLN
INTRAMUSCULAR | Status: DC | PRN
  Administered 2013-06-10: 4 mg via INTRAVENOUS

## 2013-06-10 MED ORDER — HEPARIN SODIUM (PORCINE) 5000 UNIT/ML IJ SOLN
5000.0000 [IU] | Freq: Three times a day (TID) | INTRAMUSCULAR | Status: DC
  Administered 2013-06-11 – 2013-06-18 (×22): 5000 [IU] via SUBCUTANEOUS
  Filled 2013-06-10 (×26): qty 1

## 2013-06-10 MED ORDER — MORPHINE SULFATE (PF) 1 MG/ML IV SOLN
INTRAVENOUS | Status: DC
  Administered 2013-06-10 (×2): 3 mg via INTRAVENOUS
  Administered 2013-06-10: 14:00:00 via INTRAVENOUS
  Administered 2013-06-11: 1.5 mg via INTRAVENOUS
  Administered 2013-06-11: 3 mg via INTRAVENOUS
  Administered 2013-06-11 (×3): 1.5 mg via INTRAVENOUS
  Administered 2013-06-11: 3 mg via INTRAVENOUS
  Administered 2013-06-12: 2 mg via INTRAVENOUS
  Administered 2013-06-12 (×3): 1.5 mg via INTRAVENOUS
  Administered 2013-06-12: 3 mg via INTRAVENOUS
  Administered 2013-06-12: 1.5 mg via INTRAVENOUS
  Administered 2013-06-13 (×2): 3 mg via INTRAVENOUS
  Filled 2013-06-10: qty 25

## 2013-06-10 MED ORDER — PROMETHAZINE HCL 25 MG/ML IJ SOLN: 6.2500 mg | INTRAMUSCULAR | Status: DC | PRN

## 2013-06-10 MED ORDER — CIPROFLOXACIN IN D5W 400 MG/200ML IV SOLN
INTRAVENOUS | Status: AC
  Filled 2013-06-10: qty 200

## 2013-06-10 MED ORDER — SUCCINYLCHOLINE CHLORIDE 20 MG/ML IJ SOLN
INTRAMUSCULAR | Status: DC | PRN
  Administered 2013-06-10: 100 mg via INTRAVENOUS

## 2013-06-10 MED ORDER — CIPROFLOXACIN IN D5W 400 MG/200ML IV SOLN
400.0000 mg | INTRAVENOUS | Status: AC
  Administered 2013-06-10: 400 mg via INTRAVENOUS

## 2013-06-10 MED ORDER — FENTANYL CITRATE 0.05 MG/ML IJ SOLN
INTRAMUSCULAR | Status: AC
  Filled 2013-06-10: qty 2

## 2013-06-10 MED ORDER — ONDANSETRON HCL 4 MG/2ML IJ SOLN
4.0000 mg | INTRAMUSCULAR | Status: DC | PRN
  Administered 2013-06-14 – 2013-06-17 (×7): 4 mg via INTRAVENOUS
  Filled 2013-06-10 (×7): qty 2

## 2013-06-10 MED ORDER — HYDROMORPHONE HCL PF 1 MG/ML IJ SOLN
INTRAMUSCULAR | Status: AC
  Filled 2013-06-10: qty 1

## 2013-06-10 MED ORDER — 0.9 % SODIUM CHLORIDE (POUR BTL) OPTIME
TOPICAL | Status: DC | PRN
  Administered 2013-06-10: 6000 mL

## 2013-06-10 MED ORDER — EPHEDRINE SULFATE 50 MG/ML IJ SOLN
INTRAMUSCULAR | Status: AC
  Filled 2013-06-10: qty 1

## 2013-06-10 MED ORDER — DEXAMETHASONE SODIUM PHOSPHATE 10 MG/ML IJ SOLN
INTRAMUSCULAR | Status: AC
  Filled 2013-06-10: qty 1

## 2013-06-10 MED ORDER — ONDANSETRON HCL 4 MG PO TABS: 4.0000 mg | ORAL_TABLET | Freq: Four times a day (QID) | ORAL | Status: DC | PRN

## 2013-06-10 MED ORDER — PANTOPRAZOLE SODIUM 40 MG IV SOLR
40.0000 mg | INTRAVENOUS | Status: DC
  Administered 2013-06-10 – 2013-06-12 (×3): 40 mg via INTRAVENOUS
  Filled 2013-06-10 (×5): qty 40

## 2013-06-10 MED ORDER — GLYCOPYRROLATE 0.2 MG/ML IJ SOLN
INTRAMUSCULAR | Status: AC
  Filled 2013-06-10: qty 4

## 2013-06-10 MED ORDER — GLYCOPYRROLATE 0.2 MG/ML IJ SOLN
INTRAMUSCULAR | Status: DC | PRN
  Administered 2013-06-10: 0.6 mg via INTRAVENOUS

## 2013-06-10 MED ORDER — ALVIMOPAN 12 MG PO CAPS: 12.0000 mg | ORAL_CAPSULE | Freq: Two times a day (BID) | ORAL | Status: DC

## 2013-06-10 MED ORDER — ONDANSETRON HCL 4 MG/2ML IJ SOLN
INTRAMUSCULAR | Status: AC
  Filled 2013-06-10: qty 2

## 2013-06-10 MED ORDER — LACTATED RINGERS IV SOLN
INTRAVENOUS | Status: DC | PRN
  Administered 2013-06-10 (×4): via INTRAVENOUS

## 2013-06-10 MED ORDER — LACTATED RINGERS IV SOLN: INTRAVENOUS | Status: DC

## 2013-06-10 MED ORDER — PREDNISONE 5 MG PO TABS
5.0000 mg | ORAL_TABLET | Freq: Every day | ORAL | Status: DC
Start: 2013-06-11 — End: 2013-06-10

## 2013-06-10 MED ORDER — DIPHENHYDRAMINE HCL 12.5 MG/5ML PO ELIX: 12.5000 mg | ORAL_SOLUTION | Freq: Four times a day (QID) | ORAL | Status: DC | PRN

## 2013-06-10 SURGICAL SUPPLY — 66 items
APPLICATOR COTTON TIP 6IN STRL (MISCELLANEOUS) ×2 IMPLANT
BLADE EXTENDED COATED 6.5IN (ELECTRODE) ×2 IMPLANT
BLADE HEX COATED 2.75 (ELECTRODE) ×5 IMPLANT
BLADE SURG SZ10 CARB STEEL (BLADE) IMPLANT
CANISTER SUCTION 2500CC (MISCELLANEOUS) ×1 IMPLANT
CLIP TI LARGE 6 (CLIP) IMPLANT
COVER MAYO STAND STRL (DRAPES) ×3 IMPLANT
DRAIN CHANNEL 10F 3/8 F FF (DRAIN) IMPLANT
DRAIN CHANNEL 19F RND (DRAIN) ×2 IMPLANT
DRAIN CHANNEL RND F F (WOUND CARE) ×2 IMPLANT
DRAPE INCISE IOBAN 66X45 STRL (DRAPES) IMPLANT
DRAPE LAPAROSCOPIC ABDOMINAL (DRAPES) ×3 IMPLANT
DRAPE LG THREE QUARTER DISP (DRAPES) ×2 IMPLANT
DRAPE UTILITY XL STRL (DRAPES) ×4 IMPLANT
DRAPE WARM FLUID 44X44 (DRAPE) ×3 IMPLANT
DRSG OPSITE POSTOP 4X12 (GAUZE/BANDAGES/DRESSINGS) ×2 IMPLANT
DRSG OPSITE POSTOP 4X6 (GAUZE/BANDAGES/DRESSINGS) ×2 IMPLANT
ELECT REM PT RETURN 9FT ADLT (ELECTROSURGICAL)
ELECTRODE REM PT RTRN 9FT ADLT (ELECTROSURGICAL) ×1 IMPLANT
EVACUATOR DRAINAGE 10X20 100CC (DRAIN) IMPLANT
EVACUATOR SILICONE 100CC (DRAIN) ×2 IMPLANT
GAUZE SPONGE 4X4 16PLY XRAY LF (GAUZE/BANDAGES/DRESSINGS) IMPLANT
GLOVE BIOGEL PI IND STRL 7.0 (GLOVE) ×1 IMPLANT
GLOVE BIOGEL PI INDICATOR 7.0 (GLOVE) ×6
GLOVE ECLIPSE 8.0 STRL XLNG CF (GLOVE) ×9 IMPLANT
GLOVE INDICATOR 8.0 STRL GRN (GLOVE) ×10 IMPLANT
GOWN STRL REUS W/TWL LRG LVL3 (GOWN DISPOSABLE) ×3 IMPLANT
GOWN STRL REUS W/TWL XL LVL3 (GOWN DISPOSABLE) ×14 IMPLANT
KIT BASIN OR (CUSTOM PROCEDURE TRAY) ×5 IMPLANT
LEGGING LITHOTOMY PAIR STRL (DRAPES) ×4 IMPLANT
MANIFOLD NEPTUNE II (INSTRUMENTS) ×2 IMPLANT
NS IRRIG 1000ML POUR BTL (IV SOLUTION) ×6 IMPLANT
PACK GENERAL/GYN (CUSTOM PROCEDURE TRAY) ×1 IMPLANT
PAD ABD 8X10 STRL (GAUZE/BANDAGES/DRESSINGS) IMPLANT
PAD TELFA 2X3 NADH STRL (GAUZE/BANDAGES/DRESSINGS) IMPLANT
RELOAD PROXIMATE 75MM BLUE (ENDOMECHANICALS) ×6 IMPLANT
RELOAD STAPLE 75 3.8 BLU REG (ENDOMECHANICALS) IMPLANT
SCALPEL HARMONIC ACE (MISCELLANEOUS) IMPLANT
SPONGE GAUZE 4X4 12PLY (GAUZE/BANDAGES/DRESSINGS) ×1 IMPLANT
SPONGE LAP 18X18 X RAY DECT (DISPOSABLE) ×6 IMPLANT
STAPLER CIRC ILS CVD 25MM (STAPLE) ×2 IMPLANT
STAPLER PROXIMATE 75MM BLUE (STAPLE) ×2 IMPLANT
STAPLER VISISTAT 35W (STAPLE) ×3 IMPLANT
SUCTION POOLE TIP (SUCTIONS) ×5 IMPLANT
SUT CHROMIC 0 SH (SUTURE) IMPLANT
SUT ETHILON 3 0 PS 1 (SUTURE) ×2 IMPLANT
SUT NOV 1 T60/GS (SUTURE) ×4 IMPLANT
SUT NOVA NAB DX-16 0-1 5-0 T12 (SUTURE) IMPLANT
SUT NOVA T20/GS 25 (SUTURE) IMPLANT
SUT PDS AB 1 CTX 36 (SUTURE) ×6 IMPLANT
SUT PROLENE 2 0 SH DA (SUTURE) ×2 IMPLANT
SUT SILK 2 0 (SUTURE)
SUT SILK 2 0 SH CR/8 (SUTURE) ×2 IMPLANT
SUT SILK 2 0SH CR/8 30 (SUTURE) IMPLANT
SUT SILK 2-0 18XBRD TIE 12 (SUTURE) IMPLANT
SUT SILK 2-0 30XBRD TIE 12 (SUTURE) IMPLANT
SUT SILK 3 0 (SUTURE)
SUT SILK 3 0 SH CR/8 (SUTURE) IMPLANT
SUT SILK 3-0 18XBRD TIE 12 (SUTURE) IMPLANT
SUT VIC AB 2-0 SH 18 (SUTURE) IMPLANT
SUT VIC AB 3-0 SH 18 (SUTURE) IMPLANT
SUT VICRYL 2 0 18  UND BR (SUTURE) ×2
SUT VICRYL 2 0 18 UND BR (SUTURE) ×2 IMPLANT
TOWEL OR 17X26 10 PK STRL BLUE (TOWEL DISPOSABLE) ×6 IMPLANT
TRAY FOLEY CATH 14FRSI W/METER (CATHETERS) ×1 IMPLANT
YANKAUER SUCT BULB TIP NO VENT (SUCTIONS) ×1 IMPLANT

## 2013-06-10 NOTE — Transfer of Care (Signed)
Immediate Anesthesia Transfer of Care Note  Patient: [name]  Procedure(s) Performed: Procedure(s): TOTAL abdominal COLECTOMY (N/A)  Patient Location: PACU  Anesthesia Type:General  Level of Consciousness: awake, alert , sedated and patient cooperative  Airway & Oxygen Therapy: Patient Spontanous Breathing and Patient connected to face mask oxygen  Post-op Assessment: Report given to PACU RN and Post -op Vital signs reviewed and stable  Post vital signs: Reviewed and stable  Complications: No apparent anesthesia complications

## 2013-06-10 NOTE — Anesthesia Postprocedure Evaluation (Signed)
Anesthesia Post Note  Patient: [name]  Procedure(s) Performed: Procedure(s) (LRB): TOTAL abdominal COLECTOMY (N/A)  Anesthesia type: General  Patient location: PACU  Post pain: Pain level controlled  Post assessment: Post-op Vital signs reviewed  Last Vitals:  Filed Vitals:   06/10/13 1430  BP: 185/78  Pulse: 63  Temp:   Resp: 16    Post vital signs: Reviewed  Level of consciousness: sedated  Complications: No apparent anesthesia complications

## 2013-06-10 NOTE — Anesthesia Preprocedure Evaluation (Signed)
Anesthesia Evaluation  Patient identified by MRN, date of birth, ID band Patient awake    Reviewed: Allergy & Precautions, H&P , NPO status , Patient's Chart, lab work & pertinent test results  Airway Mallampati: II TM Distance: >3 FB Neck ROM: Full    Dental  (+) Teeth Intact, Dental Advisory Given, Caps, Partial Upper   Pulmonary pneumonia -,  breath sounds clear to auscultation  Pulmonary exam normal       Cardiovascular negative cardio ROS  Rhythm:Regular Rate:Normal     Neuro/Psych negative neurological ROS  negative psych ROS   GI/Hepatic Neg liver ROS, hiatal hernia, GERD-  Medicated,History Nissen Fundoplication Hx of diverticulitis with recurrent bleeding.    Endo/Other  negative endocrine ROS  Renal/GU negative Renal ROS  negative genitourinary   Musculoskeletal  (+) Arthritis -, Rheumatoid disorders,    Abdominal   Peds negative pediatric ROS (+)  Hematology negative hematology ROS (+) anemia , Multiple blood transfusions   Anesthesia Other Findings Caps upper incisors  Reproductive/Obstetrics                           Anesthesia Physical Anesthesia Plan  ASA: III  Anesthesia Plan: General   Post-op Pain Management:    Induction: Intravenous  Airway Management Planned: Oral ETT  Additional Equipment:   Intra-op Plan:   Post-operative Plan: Extubation in OR  Informed Consent: I have reviewed the patients History and Physical, chart, labs and discussed the procedure including the risks, benefits and alternatives for the proposed anesthesia with the patient or authorized representative who has indicated his/her understanding and acceptance.   Dental advisory given  Plan Discussed with: CRNA  Anesthesia Plan Comments:         Anesthesia Quick Evaluation

## 2013-06-10 NOTE — Op Note (Signed)
Operative Note  [name] male 73 y.o. 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         Surgeon: Odis Hollingshead   Assistants: Ralene Ok M.D.  Anesthesia: General endotracheal anesthesia  Indications:  This is a 73 year old male whose had multiple recurrent episodes of lower GI bleeding and to pandiverticular disease. He now presents for the above procedure.    Procedure Detail:  He was seen in the holding room and brought to the operating room placed supine on the operating table and a general anesthetic was given. He was placed in the lithotomy position. A Foley catheter was inserted. The hair on the abdominal wall was clipped. The abdominal wall and perineal areas were then sterilely prepped and draped.  I incised the skin through previous midline incision and extended this to the pubis. The subcutaneous tissue was divided in the lower abdominal area. I entered the peritoneal cavity.  He had a previous ventral hernia repair mesh. I divided the peritoneum up to the mesh and divided the fixation sutures. A piece of small intestine was fairly densely adherent to the mesh. I then approached the area of the epigastrium. I divided the mesh here and entered the peritoneal cavity. I continued dividing mesh and dissecting omental lesions free from its. There was transverse colon is adherent to the mesh by way of thin adhesions and these were divided sharply. I then divided the mesh inferiorly until I came to the point where the small intestine was adherent to it. Using careful sharp dissection I freed the small intestine for most of the mesh. A small piece of mesh was left on the small intestine. This completely freed up the small intestine from the mesh and abdominal wall. I directed my attention to the omentum  which was was adherent to the mesh. Using electrocautery I dissected the omentum free from the mesh. Bleeding was controlled with the LigaSure, sutures, and cautery. The lysis of adhesions took approximately one hour.  I did remove 3 spiral tacks that were holding the mesh in.  Identified the terminal ileum and freed of adhesions from that. I then divided the terminal ileum just proximal to the ileocecal valve using a GIA stapler. I mobilized the right colon by dividing its lateral attachments. I then began mobilizing omentum free from the transverse colon. The mesentery of the right colon was divided close to the colon using the LigaSure device. In this way I was able to mobilize the hepatic flexure. I continued to dissect the omentum free from the transverse colon heading up to the left upper quadrant where the splenic flexure of the colon was noted to be very close to the spleen.  Then, going down into the pelvis began mobilizing the rectosigmoid junction and sigmoid colon as well as the left colon by dividing the lateral attachments. I chose a point just distal to the rectosigmoid junction. Using the linear cutting stapler I divided the rectum just distal to the rectosigmoid junction. The left ureter was identified. The mesentery of the colon was then divided using a LigaSure device close to the edge of the colon.  This was continued up toward the splenic flexure. I then divided the transverse colon mesentery, close to the colon, using the LigaSure device. I carefully mobilized the splenic flexure using electrocautery and the LigaSure. I then completed  the mesenteric dissection. This freed up the specimen. The total abdominal colon was then handed off the field with a segment of distal ileum.  A four-quadrant inspection and irrigation was performed. Bleeding from the omentum and abdominal wall were controlled electrocautery.  The small intestine had been run and there is no evidence of enterotomy. There is  no evidence of organ injury.  Then,  I removed the staple line from the distal ileum. A size 25 EEA and the was then placed into the ileum. The defect was then closed with the linear cutting stapler. I brought the anvil out the antimesenteric side of the ileum and a Baker type fashion. A pursestring suture of 2-0 Prolene was used to secure it.  The handle of the EEA stapler was introduced through the anus. A staple anastomosis was then performed between the anterior wall of the rectal stump and the distal ileum using the size 25 EEA stapler. 2 intact donuts were noted. The small bowel was occluded just proximal to the anastomosis and placed under water. It was insufflated into the rectum. There is no evidence of leak.  The abdominal cavity was then copiously irrigated out with saline solution. There is no evidence of bleeding or organ injury. A stab wound was made in the left lower quadrant. A size 19 Blake drain was introduced into the abdominal cavity and placed into the pelvis. It was anchored to the skin with 3-0 nylon suture.  The irrigation fluid was evacuated as much as possible.  I then began closing the fascia and reapproximating the mesh as well with a running #1 Novafil suture. Needle, sponge, and instrument counts were reported to be correct. The subcutaneous tissue was irrigated. The skin was closed with staples. Sterile dressings were applied.  He tolerated the procedure well without any apparent complications and was taken to the recovery room in satisfactory condition.  Estimated Blood Loss:  400 ml         Drains: 19 Blake drain  Blood Given: none          Specimens: Abdominal colon        Complications:  * No complications entered in OR log *         Disposition: PACU - hemodynamically stable.         Condition: stable

## 2013-06-10 NOTE — H&P (View-Only) (Signed)
Patient ID: [name], male   DOB: 1941/03/31, 73 y.o.   MRN: [id number]  Chief Complaint  Patient presents with  . Follow-up    reck recurrent diverticulitis    HPI [name] is a 73 y.o. male.   HPI  [name] is a gentleman with multiple lower gastric or intestinal bleeds due to diverticular disease.  I saw him 2 years ago because of this. My recommendation was that if he had another bleed, he should have surgery while he was in the hospital.  He has a colonoscopy which demonstrates pan diverticulosis.  He was admitted to the hospital October 2014 with another lower GI bleed. He has had arteriogram demonstrating the bleed to be near the hepatic flexure and the coil was placed in the small vessel at that time.  He has had other bleeds, however, that were unable to be localized. He is here to discuss surgery.  Because of the one bleeder was localized to the hepatic flexure, people in the hospital recommend he have a right hemicolectomy.  Past Medical History  Diagnosis Date  . Bursitis     chronic hip pain  . BPH (benign prostatic hyperplasia)   . Blood transfusion     " no reaction from transfusion"  . GERD (gastroesophageal reflux disease)   . H/O hiatal hernia   . Spondylisthesis   . Osteoporosis   . Bruises easily   . Diverticulitis   . Pneumonia   . Rash     GROIN - ITCHING PAST 3 MONTHS  . History of kidney stones   . History of GI bleed     "vessels burst in colon" x2  . Rheumatoid arthritis(714.0)     27 years  . Osteoarthritis   . Disc disease, degenerative, lumbar or lumbosacral     Chronic back pain now  . History of vertigo     Past Surgical History  Procedure Laterality Date  . Total knee arthroplasty  2003    Bilateral  . Nissen fundoplication      about 5277, pt not sure records not currently available  . Lithotripsy for nephrolithiasis      Pt and wife not sure when but here in [place]  . Lysis of adhesion, small bowel resection  2009  .  Hernia repair    . Inguinal hernia repair  2007    Bilateral   . Ventral hernia repair with mesh    . Esophagogastroduodenoscopy N/A 10/01/2012    Procedure: ESOPHAGOGASTRODUODENOSCOPY (EGD);  Surgeon: Garlan Fair, MD;  Location: [facility];  Service: Endoscopy;  Laterality: N/A;  . Balloon dilation N/A 10/01/2012    Procedure: BALLOON DILATION;  Surgeon: Garlan Fair, MD;  Location: [facility];  Service: Endoscopy;  Laterality: N/A;  . Cystoscopy with ureteroscopy Right 12/05/2012    Procedure: CYSTOSCOPY WITH RIGHT URETEROSCOPY AND STONE EXTRACTION;  Surgeon: Malka So, MD;  Location: [facility];  Service: Urology;  Laterality: Right;  . Transurethral prostatectomy with gyrus instruments N/A 12/05/2012    Procedure: TRANSURETHRAL PROSTATECTOMY WITH GYRUS INSTRUMENTS;  Surgeon: Malka So, MD;  Location: [facility];  Service: Urology;  Laterality: N/A;    History reviewed. No pertinent family history.  Social History History  Substance Use Topics  . Smoking status: Never Smoker   . Smokeless tobacco: Never Used  . Alcohol Use: No    Allergies  Allergen Reactions  . Clindamycin/Lincomycin Hives and Swelling  . Codeine Hives and Swelling  .  Penicillins Hives and Swelling    Current Outpatient Prescriptions  Medication Sig Dispense Refill  . acetaminophen (TYLENOL) 650 MG CR tablet Take 1,300 mg by mouth 2 (two) times daily.      . Homeopathic Products [facility] ALLERGY EYE RELIEF) SOLN Apply 1 drop to eye daily as needed (for allergies).      Marland Kitchen leflunomide (ARAVA) 20 MG tablet Take 20 mg by mouth every morning.       Marland Kitchen omeprazole (PRILOSEC) 20 MG capsule Take 20 mg by mouth daily.        . predniSONE (DELTASONE) 5 MG tablet Take 5 mg by mouth daily.      . traMADol (ULTRAM) 50 MG tablet Take 50 mg by mouth 2 (two) times daily.      . Multiple Vitamins-Minerals (ICAPS) CAPS Take 1 capsule by mouth daily.       No current facility-administered medications for this visit.     Review of Systems Review of Systems  Constitutional: Negative.   HENT: Positive for trouble swallowing.   Respiratory: Negative.   Cardiovascular: Negative.   Gastrointestinal: Positive for blood in stool.  Genitourinary: Negative.   Musculoskeletal: Positive for arthralgias.  Neurological: Negative.     Blood pressure 160/92, pulse 62, temperature 98.2 F (36.8 C), temperature source Temporal, resp. rate 14, height 5\' 7"  (1.702 m), weight 181 lb 3.2 oz (82.192 kg).  Physical Exam Physical Exam  Constitutional: He appears well-developed and well-nourished. No distress.  HENT:  Head: Normocephalic and atraumatic.  Eyes: No scleral icterus.  Cardiovascular: Normal rate and regular rhythm.   Pulmonary/Chest: Effort normal and breath sounds normal.  Abdominal: Soft. He exhibits no distension and no mass. There is no tenderness.  Long midline scar. Multiple small scars laterally.  Musculoskeletal: He exhibits no edema.  Neurological: He is alert.  Skin: Skin is warm and dry.    Data Reviewed Notes in Epic.  Assessment    Recurrent lower GI bleeds felt to be secondary to diverticular disease. He has a complex history of abdominal surgery including an open Nissen fundoplication, exploratory laparotomy and small bowel resection, laparoscopic ventral hernia repair with mesh.  He also takes prednisone and Arava for rheumatoid arthritis. The Jolee Ewing has been reported to lead to problems with wound healing.     Plan    I recommended a total abdominal colectomy with ileal rectal anastomosis possible ileostomy.  I am not confident that all of his bleeding has come from the right colon area. I explained my reasoning to him for this. I told him I would be to speak of his rheumatologist about his medications and see if we could stop the [place].  I  explained the procedure and risks of colon resection.  Risks include but are not limited to bleeding, infection, wound problems, anesthesia,  anastomotic leak, need for colostomy, injury to intraabominal organs (such as intestine, spleen, kidney, bladder, ureter, etc.), ileus, irregular bowel habits.  He seems to understand.  He is going to go home and discuss it with his wife. He will call us back. I did offer him a second opinion if he did not want to have a total abdominal colectomy.       [name] 05/14/2013, 12:29 PM

## 2013-06-10 NOTE — Interval H&P Note (Signed)
History and Physical Interval Note:  06/10/2013 9:23 AM  [name]  has presented today for surgery, with the diagnosis of diverticulitis with recurrent bleeding   The various methods of treatment have been discussed with the patient and family. After consideration of risks, benefits and other options for treatment, the patient has consented to  Procedure(s): TOTAL abdominal COLECTOMY (N/A) as a surgical intervention .  The patient's history has been reviewed, patient examined, no change in status, stable for surgery.  I have reviewed the patient's chart and labs.  Questions were answered to the patient's satisfaction.     Ahmaya Ostermiller Lenna Sciara

## 2013-06-11 ENCOUNTER — Encounter (HOSPITAL_COMMUNITY): Payer: Self-pay | Admitting: General Surgery

## 2013-06-11 DIAGNOSIS — IMO0002 Reserved for concepts with insufficient information to code with codable children: Secondary | ICD-10-CM

## 2013-06-11 LAB — CBC
HEMATOCRIT: 29.4 % — AB (ref 39.0–52.0)
HEMOGLOBIN: 8.3 g/dL — AB (ref 13.0–17.0)
MCH: 19.3 pg — ABNORMAL LOW (ref 26.0–34.0)
MCHC: 28.2 g/dL — ABNORMAL LOW (ref 30.0–36.0)
MCV: 68.4 fL — ABNORMAL LOW (ref 78.0–100.0)
Platelets: 222 10*3/uL (ref 150–400)
RBC: 4.3 MIL/uL (ref 4.22–5.81)
RDW: 18.6 % — ABNORMAL HIGH (ref 11.5–15.5)
WBC: 14.6 10*3/uL — AB (ref 4.0–10.5)

## 2013-06-11 LAB — BASIC METABOLIC PANEL
BUN: 14 mg/dL (ref 6–23)
CO2: 26 mEq/L (ref 19–32)
Calcium: 8.4 mg/dL (ref 8.4–10.5)
Chloride: 102 mEq/L (ref 96–112)
Creatinine, Ser: 0.99 mg/dL (ref 0.50–1.35)
GFR, EST NON AFRICAN AMERICAN: 80 mL/min — AB (ref >90)
Glucose, Bld: 174 mg/dL — ABNORMAL HIGH (ref 70–99)
POTASSIUM: 4.4 meq/L (ref 3.7–5.3)
SODIUM: 137 meq/L (ref 137–147)

## 2013-06-11 NOTE — Progress Notes (Signed)
1 Day Post-Op  Subjective: Sore.  No nausea. Stood up last night.  We discussed the conduct of the surgery.  Objective: Vital signs in last 24 hours: Temp:  [97.4 F (36.3 C)-98.6 F (37 C)] 98.2 F (36.8 C) (02/25 0610) Pulse Rate:  [60-73] 70 (02/25 0610) Resp:  [12-24] 16 (02/25 0807) BP: (136-194)/(75-94) 158/75 mmHg (02/25 0610) SpO2:  [96 %-100 %] 98 % (02/25 0807) Weight:  [183 lb 13.8 oz (83.4 kg)] 183 lb 13.8 oz (83.4 kg) (02/24 1549)    Intake/Output from previous day: 02/24 0701 - 02/25 0700 In: 4618.8 [I.V.:4618.8] Out: 2140 [Urine:1600; Drains:190; Blood:350] Intake/Output this shift:    PE: General- In NAD Lungs-clear Abdomen-soft, slightly distended, few bowel sounds, dressings dry, thin serosanguinous drain output  Lab Results:   Recent Labs  06/11/13 0525  WBC 14.6*  HGB 8.3*  HCT 29.4*  PLT 222   BMET  Recent Labs  06/11/13 0525  NA 137  K 4.4  CL 102  CO2 26  GLUCOSE 174*  BUN 14  CREATININE 0.99  CALCIUM 8.4   PT/INR No results found for this basename: LABPROT, INR,  in the last 72 hours Comprehensive Metabolic Panel:    Component Value Date/Time   NA 137 06/11/2013 0525   NA 140 06/03/2013 0915   K 4.4 06/11/2013 0525   K 4.4 06/03/2013 0915   CL 102 06/11/2013 0525   CL 102 06/03/2013 0915   CO2 26 06/11/2013 0525   CO2 27 06/03/2013 0915   BUN 14 06/11/2013 0525   BUN 13 06/03/2013 0915   CREATININE 0.99 06/11/2013 0525   CREATININE 0.96 06/03/2013 0915   GLUCOSE 174* 06/11/2013 0525   GLUCOSE 95 06/03/2013 0915   CALCIUM 8.4 06/11/2013 0525   CALCIUM 9.4 06/03/2013 0915   AST 16 06/03/2013 0915   AST 12 02/06/2013 0525   ALT 10 06/03/2013 0915   ALT 6 02/06/2013 0525   ALKPHOS 63 06/03/2013 0915   ALKPHOS 44 02/06/2013 0525   BILITOT 0.4 06/03/2013 0915   BILITOT 0.4 02/06/2013 0525   PROT 7.5 06/03/2013 0915   PROT 4.8* 02/06/2013 0525   ALBUMIN 3.4* 06/03/2013 0915   ALBUMIN 2.3* 02/06/2013 0525     Studies/Results: No  results found.  Anti-infectives: Anti-infectives   Start     Dose/Rate Route Frequency Ordered Stop   06/10/13 0725  metroNIDAZOLE (FLAGYL) IVPB 500 mg     500 mg 100 mL/hr over 60 Minutes Intravenous On call to O.R. 06/10/13 0725 06/10/13 1010   06/10/13 0725  ciprofloxacin (CIPRO) IVPB 400 mg     400 mg 200 mL/hr over 60 Minutes Intravenous On call to O.R. 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  Decrease IVF.  OOB.   [name] 06/11/2013

## 2013-06-11 NOTE — Care Management Note (Signed)
    Page 1 of 1   06/11/2013     11:44:16 AM   CARE MANAGEMENT NOTE 06/11/2013  Patient:  [name], [name]   Account Number:  [id number]  Date Initiated:  06/11/2013  Documentation initiated by:  Sunday Spillers  Subjective/Objective Assessment:   73 yo male admitted s/p expl. lap with lysis of adhesions, colectomy. PTA lived at home with spouse.     Action/Plan:   Home when stable   Anticipated DC Date:  06/14/2013   Anticipated DC Plan:  [place]  CM consult      Choice offered to / List presented to:             Status of service:  Completed, signed off Medicare Important Message given?   (If response is "NO", the following Medicare IM given date fields will be blank) Date Medicare IM given:   Date Additional Medicare IM given:    Discharge Disposition:  HOME/SELF CARE  Per UR Regulation:  Reviewed for med. necessity/level of care/duration of stay  If discussed at [place] of Stay Meetings, dates discussed:    Comments:

## 2013-06-12 MED ORDER — HYDRALAZINE HCL 20 MG/ML IJ SOLN
10.0000 mg | INTRAMUSCULAR | Status: DC | PRN
  Filled 2013-06-12: qty 0.5

## 2013-06-12 NOTE — Progress Notes (Signed)
I have seen and examined the pt and agree with NP-Riebocks's progress note. Con't to await for bowel function Ambulate Repeat labs in AM

## 2013-06-12 NOTE — Progress Notes (Signed)
Patient ID: [name], male   DOB: 1941-01-03, 73 y.o.   MRN: [id number]  Subjective: Not much of an appetite.  No n/v.  No flatus.  Foley out, voided.    Objective:  Vital signs:  Filed Vitals:   06/12/13 0026 06/12/13 0139 06/12/13 0441 06/12/13 0605  BP:  168/80  153/80  Pulse:  63  73  Temp:  98 F (36.7 C)  97.8 F (36.6 C)  TempSrc:  Oral  Oral  Resp: 20 15 16 15   Height:      Weight:      SpO2: 96% 97% 97% 97%    Last BM Date: 06/10/13  Intake/Output   Yesterday:  02/25 0701 - 02/26 0700 In: 2465 [P.O.:60; I.V.:2405] Out: 2935 [Urine:2825; Drains:110] This shift:  Total I/O In: -  Out: 230 [Urine:200; Drains:30]   Physical Exam: General: Pt awake/alert/oriented x3 in no acute distress Chest: CTA. No chest wall pain w good excursion CV:  Pulses intact.  Regular rhythm MS: Normal AROM mjr joints.  No obvious deformity Abdomen: distended.  +bs.  Appropriately tender.  Midline incision is c/d/i.  No evidence of peritonitis.  No incarcerated hernias. Ext:  SCDs BLE.  No mjr edema.  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 06/10/13 Active Problems:   Long-term current use of steroids    Results:   Labs: Results for orders placed during the hospital encounter of 06/10/13 (from the past 48 hour(s))  PREPARE RBC (CROSSMATCH)     Status: None   Collection Time    06/10/13 10:57 AM      Result Value Ref Range   Order Confirmation ORDER PROCESSED BY BLOOD BANK    BASIC METABOLIC PANEL     Status: Abnormal   Collection Time    06/11/13  5:25 AM      Result Value Ref Range   Sodium 137  137 - 147 mEq/L   Potassium 4.4  3.7 - 5.3 mEq/L   Chloride 102  96 - 112 mEq/L   CO2 26  19 - 32 mEq/L   Glucose, Bld 174 (*) 70 - 99 mg/dL   BUN 14  6 - 23 mg/dL   Creatinine, Ser 0.99  0.50 - 1.35 mg/dL   Calcium 8.4  8.4 - 10.5 mg/dL   GFR calc non Af Amer 80 (*) >90 mL/min   GFR calc Af Amer >90  >90 mL/min   Comment: (NOTE)     The eGFR has been calculated using the CKD EPI equation.     This calculation has not been validated in all clinical situations.     eGFR's persistently <90 mL/min signify possible Chronic Kidney     Disease.  CBC     Status: Abnormal   Collection Time    06/11/13  5:25 AM      Result Value Ref Range   WBC 14.6 (*) 4.0 - 10.5 K/uL   RBC 4.30  4.22 - 5.81 MIL/uL   Hemoglobin 8.3 (*) 13.0 - 17.0 g/dL   HCT 29.4 (*) 39.0 - 52.0 %   MCV 68.4 (*) 78.0 - 100.0 fL   MCH 19.3 (*) 26.0 - 34.0 pg   MCHC 28.2 (*) 30.0 - 36.0 g/dL   RDW 18.6 (*) 11.5 - 15.5 %   Platelets 222  150 - 400 K/uL    Imaging / Studies:  No results found.  Medications / Allergies: per chart  Antibiotics: Anti-infectives   Start     Dose/Rate Route Frequency Ordered Stop   06/10/13 0725  metroNIDAZOLE (FLAGYL) IVPB 500 mg     500 mg 100 mL/hr over 60 Minutes Intravenous On call to O.R. 06/10/13 0725 06/10/13 1010   06/10/13 0725  ciprofloxacin (CIPRO) IVPB 400 mg     400 mg 200 mL/hr over 60 Minutes Intravenous On call to O.R. 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  His abdomen is distended, but no nausea or vomiting.  Should he develops symptoms will place a NGT and NPO -continue IV hydration  -heparin, SCDs for VTE prophylaxis -mobilize -will remove dressing tomorrow  Long-term current use of steroids-Prednisone restarted   Acute on chronic anemia-repeat labs in AM  Elevated blood pressure-PRN hydralazine   Erby Pian, [facility] Surgery Pager [phone] Office [phone]  06/12/2013 10:15 AM

## 2013-06-13 LAB — CBC
HEMATOCRIT: 30.1 % — AB (ref 39.0–52.0)
HEMOGLOBIN: 8.6 g/dL — AB (ref 13.0–17.0)
MCH: 19.7 pg — AB (ref 26.0–34.0)
MCHC: 28.6 g/dL — ABNORMAL LOW (ref 30.0–36.0)
MCV: 68.9 fL — AB (ref 78.0–100.0)
PLATELETS: 214 10*3/uL (ref 150–400)
RBC: 4.37 MIL/uL (ref 4.22–5.81)
RDW: 19 % — ABNORMAL HIGH (ref 11.5–15.5)
WBC: 13.5 10*3/uL — ABNORMAL HIGH (ref 4.0–10.5)

## 2013-06-13 MED ORDER — OXYCODONE-ACETAMINOPHEN 5-325 MG PO TABS
1.0000 | ORAL_TABLET | ORAL | Status: DC | PRN
  Administered 2013-06-13 – 2013-06-15 (×8): 2 via ORAL
  Filled 2013-06-13 (×9): qty 2

## 2013-06-13 MED ORDER — PANTOPRAZOLE SODIUM 40 MG PO TBEC
40.0000 mg | DELAYED_RELEASE_TABLET | Freq: Every day | ORAL | Status: DC
  Administered 2013-06-13 – 2013-06-18 (×6): 40 mg via ORAL
  Filled 2013-06-13 (×6): qty 1

## 2013-06-13 NOTE — Progress Notes (Signed)

## 2013-06-13 NOTE — Progress Notes (Signed)
3 Days Post-Op  Subjective: Pt doing well today.  Ambulated 2-3x yesterday.  Pain well controlled and not using PCA very much.  BM yesterday and 1 this AM.   Objective: Vital signs in last 24 hours: Temp:  [97.9 F (36.6 C)-98.9 F (37.2 C)] 98.1 F (36.7 C) (02/27 0600) Pulse Rate:  [66-71] 71 (02/27 0600) Resp:  [15-20] 16 (02/27 0600) BP: (158-188)/(73-86) 180/82 mmHg (02/27 0600) SpO2:  [94 %-98 %] 98 % (02/27 0600) FiO2 (%):  [32 %-35 %] 32 % (02/27 0400) Last BM Date: 06/13/13  Intake/Output from previous day: 02/26 0701 - 02/27 0700 In: 2640 [P.O.:240; I.V.:2400] Out: 1120 [Urine:900; Drains:220] Intake/Output this shift:    General appearance: alert and cooperative Resp: clear to auscultation bilaterally Cardio: regular rate and rhythm, S1, S2 normal, no murmur, click, rub or gallop GI: s/nt/nd active BS, wound c/d/i, JP SS  Lab Results:   Recent Labs  06/11/13 0525 06/13/13 0450  WBC 14.6* 13.5*  HGB 8.3* 8.6*  HCT 29.4* 30.1*  PLT 222 214   BMET  Recent Labs  06/11/13 0525  NA 137  K 4.4  CL 102  CO2 26  GLUCOSE 174*  BUN 14  CREATININE 0.99  CALCIUM 8.4    Anti-infectives: Anti-infectives   Start     Dose/Rate Route Frequency Ordered Stop   06/10/13 0725  metroNIDAZOLE (FLAGYL) IVPB 500 mg     500 mg 100 mL/hr over 60 Minutes Intravenous On call to O.R. 06/10/13 0725 06/10/13 1010   06/10/13 0725  ciprofloxacin (CIPRO) IVPB 400 mg     400 mg 200 mL/hr over 60 Minutes Intravenous On call to O.R. 06/10/13 0725 06/10/13 1779      Assessment/Plan: s/p Procedure(s): TOTAL abdominal COLECTOMY (N/A) Looks great today Will switch to PO pain Rxs Con't to encourage ambulation Con't JP Adv to soft diet Home in next 2-3d  LOS: 3 days    Rosario Jacks., [facility] 06/13/2013

## 2013-06-14 LAB — TYPE AND SCREEN
ABO/RH(D): A NEG
Antibody Screen: NEGATIVE
UNIT DIVISION: 0
Unit division: 0

## 2013-06-14 NOTE — Progress Notes (Signed)
Patient ID: [name], male   DOB: Apr 12, 1941, 73 y.o.   MRN: [id number] 4 Days Post-Op  Subjective: Not feeling as well today, "bad night". Had a lot of frequent loose stools. Also some nausea without vomiting. Some abdominal pain which is crampy and intermittent.  Objective: Vital signs in last 24 hours: Temp:  [98.6 F (37 C)-99.7 F (37.6 C)] 99.7 F (37.6 C) (02/28 0600) Pulse Rate:  [73-82] 80 (02/28 0600) Resp:  [16-18] 16 (02/28 0600) BP: (127-155)/(71-88) 150/71 mmHg (02/28 0600) SpO2:  [92 %-96 %] 92 % (02/28 0600) Last BM Date: 06/13/13  Intake/Output from previous day: 02/27 0701 - 02/28 0700 In: 2882.9 [P.O.:900; I.V.:1752.9] Out: 173 [Urine:5; Drains:165; Stool:3] Intake/Output this shift:    General appearance: alert, cooperative and no distress GI: minimal distention. Active bowel sounds. Mild diffuse tenderness without guarding. JP drainage is clear blood-tinged serous Incision/Wound: clean and dry without evidence of infection  Lab Results:   Recent Labs  06/13/13 0450  WBC 13.5*  HGB 8.6*  HCT 30.1*  PLT 214   BMET No results found for this basename: NA, K, CL, CO2, GLUCOSE, BUN, CREATININE, CALCIUM,  in the last 72 hours   Studies/Results: No results found.  Anti-infectives: Anti-infectives   Start     Dose/Rate Route Frequency Ordered Stop   06/10/13 0725  metroNIDAZOLE (FLAGYL) IVPB 500 mg     500 mg 100 mL/hr over 60 Minutes Intravenous On call to O.R. 06/10/13 0725 06/10/13 1010   06/10/13 0725  ciprofloxacin (CIPRO) IVPB 400 mg     400 mg 200 mL/hr over 60 Minutes Intravenous On call to O.R. 06/10/13 0725 06/10/13 7793      Assessment/Plan: s/p Procedure(s): TOTAL abdominal COLECTOMY Frequent bowel movements as expected with return of bowel function. I would not want to use any antimotility agents this early postop. No change in treatment today. Check lab tomorrow.   LOS: 4 days    Lindell Tussey T 06/14/2013

## 2013-06-15 LAB — CBC
HCT: 30.5 % — ABNORMAL LOW (ref 39.0–52.0)
HEMOGLOBIN: 8.6 g/dL — AB (ref 13.0–17.0)
MCH: 19.3 pg — ABNORMAL LOW (ref 26.0–34.0)
MCHC: 28.2 g/dL — ABNORMAL LOW (ref 30.0–36.0)
MCV: 68.5 fL — AB (ref 78.0–100.0)
Platelets: 238 10*3/uL (ref 150–400)
RBC: 4.45 MIL/uL (ref 4.22–5.81)
RDW: 19.3 % — ABNORMAL HIGH (ref 11.5–15.5)
WBC: 9 10*3/uL (ref 4.0–10.5)

## 2013-06-15 LAB — BASIC METABOLIC PANEL
BUN: 17 mg/dL (ref 6–23)
CALCIUM: 8.6 mg/dL (ref 8.4–10.5)
CO2: 27 meq/L (ref 19–32)
CREATININE: 0.97 mg/dL (ref 0.50–1.35)
Chloride: 99 mEq/L (ref 96–112)
GFR calc Af Amer: >90 mL/min (ref >90)
GFR calc non Af Amer: 81 mL/min — ABNORMAL LOW (ref >90)
GLUCOSE: 139 mg/dL — AB (ref 70–99)
Potassium: 4.2 mEq/L (ref 3.7–5.3)
Sodium: 137 mEq/L (ref 137–147)

## 2013-06-15 MED ORDER — PROMETHAZINE HCL 25 MG/ML IJ SOLN
12.5000 mg | Freq: Four times a day (QID) | INTRAMUSCULAR | Status: DC | PRN
  Administered 2013-06-15: 12.5 mg via INTRAVENOUS
  Filled 2013-06-15: qty 1

## 2013-06-15 NOTE — Progress Notes (Signed)
Pt experienced episode of emesis 20 ml bile at this time.  Too early for zofran.  Notified Dr. Excell Seltzer.  Phenergan 12.5 ml IV ordered.  Given per protocol.  Pt resting comfortably.  Will continue to monitor.  Iantha Fallen RN 5:56 AM 06/15/2013

## 2013-06-15 NOTE — Progress Notes (Signed)
Patient ID: [name], male   DOB: 1940-09-17, 73 y.o.   MRN: [id number] 5 Days Post-Op  Subjective: Vomited a small amount once last night. Feels better this morning. Still some nausea but improved. He had a large loose bowel movement this morning.  Objective: Vital signs in last 24 hours: Temp:  [97.8 F (36.6 C)-98.7 F (37.1 C)] 97.9 F (36.6 C) (03/01 0520) Pulse Rate:  [68-78] 71 (03/01 0520) Resp:  [16-18] 18 (03/01 0520) BP: (127-168)/(70-83) 164/73 mmHg (03/01 0520) SpO2:  [93 %-95 %] 95 % (03/01 0520) Last BM Date: 06/14/13  Intake/Output from previous day: 02/28 0701 - 03/01 0700 In: 2955 [P.O.:1080; I.V.:1875] Out: 520 [Urine:150; Emesis/NG output:20; Drains:350] Intake/Output this shift:    General appearance: alert, cooperative and no distress GI: mild distention. Minimal diffuse tenderness. JP in place with clear serosanguineous drainage. Incision/Wound: clean and dry without evidence of infection  Lab Results:   Recent Labs  06/13/13 0450 06/15/13 0556  WBC 13.5* 9.0  HGB 8.6* 8.6*  HCT 30.1* 30.5*  PLT 214 238   BMET  Recent Labs  06/15/13 0556  NA 137  K 4.2  CL 99  CO2 27  GLUCOSE 139*  BUN 17  CREATININE 0.97  CALCIUM 8.6     Studies/Results: No results found.  Anti-infectives: Anti-infectives   Start     Dose/Rate Route Frequency Ordered Stop   06/10/13 0725  metroNIDAZOLE (FLAGYL) IVPB 500 mg     500 mg 100 mL/hr over 60 Minutes Intravenous On call to O.R. 06/10/13 0725 06/10/13 1010   06/10/13 0725  ciprofloxacin (CIPRO) IVPB 400 mg     400 mg 200 mL/hr over 60 Minutes Intravenous On call to O.R. 06/10/13 0725 06/10/13 8937      Assessment/Plan: s/p Procedure(s): TOTAL abdominal COLECTOMY Appears to have some postoperative ileus. Overall however he is improved today. Continue to increase activity. He is restricting his diet on his own and will advance as he feels better.    LOS: 5 days    [name]  T 06/15/2013

## 2013-06-16 MED ORDER — ACETAMINOPHEN 325 MG PO TABS: 650.0000 mg | ORAL_TABLET | ORAL | Status: DC | PRN

## 2013-06-16 MED ORDER — MENTHOL 3 MG MT LOZG
1.0000 | LOZENGE | OROMUCOSAL | Status: DC | PRN
  Administered 2013-06-16: 3 mg via ORAL
  Filled 2013-06-16: qty 9

## 2013-06-16 NOTE — Progress Notes (Signed)
  6 Days Post-Op  Subjective: Has nausea with Percocet.  No vomiting overnight.  Belching, passing gas, has had multiple BMs.  Sore throat.  Objective: Vital signs in last 24 hours: Temp:  [97.9 F (36.6 C)-98 F (36.7 C)] 98 F (36.7 C) (03/02 0522) Pulse Rate:  [68-69] 68 (03/02 0522) Resp:  [16-18] 18 (03/02 0522) BP: (122-155)/(71-78) 155/71 mmHg (03/02 0522) SpO2:  [91 %-95 %] 94 % (03/02 0522) Last BM Date: 06/15/13  Intake/Output from previous day: 03/01 0701 - 03/02 0700 In: 2275 [P.O.:700; I.V.:1575] Out: 875 [Urine:600; Drains:275] Intake/Output this shift:    PE: General- In NAD Abdomen-soft, mild distension, active bowel sounds, incision clean and intact, serous drain output  Lab Results:   Recent Labs  06/15/13 0556  WBC 9.0  HGB 8.6*  HCT 30.5*  PLT 238   BMET  Recent Labs  06/15/13 0556  NA 137  K 4.2  CL 99  CO2 27  GLUCOSE 139*  BUN 17  CREATININE 0.97  CALCIUM 8.6   PT/INR No results found for this basename: LABPROT, INR,  in the last 72 hours Comprehensive Metabolic Panel:    Component Value Date/Time   NA 137 06/15/2013 0556   NA 137 06/11/2013 0525   K 4.2 06/15/2013 0556   K 4.4 06/11/2013 0525   CL 99 06/15/2013 0556   CL 102 06/11/2013 0525   CO2 27 06/15/2013 0556   CO2 26 06/11/2013 0525   BUN 17 06/15/2013 0556   BUN 14 06/11/2013 0525   CREATININE 0.97 06/15/2013 0556   CREATININE 0.99 06/11/2013 0525   GLUCOSE 139* 06/15/2013 0556   GLUCOSE 174* 06/11/2013 0525   CALCIUM 8.6 06/15/2013 0556   CALCIUM 8.4 06/11/2013 0525   AST 16 06/03/2013 0915   AST 12 02/06/2013 0525   ALT 10 06/03/2013 0915   ALT 6 02/06/2013 0525   ALKPHOS 63 06/03/2013 0915   ALKPHOS 44 02/06/2013 0525   BILITOT 0.4 06/03/2013 0915   BILITOT 0.4 02/06/2013 0525   PROT 7.5 06/03/2013 0915   PROT 4.8* 02/06/2013 0525   ALBUMIN 3.4* 06/03/2013 0915   ALBUMIN 2.3* 02/06/2013 0525     Studies/Results: No results found.  Anti-infectives: Anti-infectives   Start      Dose/Rate Route Frequency Ordered Stop   06/10/13 0725  metroNIDAZOLE (FLAGYL) IVPB 500 mg     500 mg 100 mL/hr over 60 Minutes Intravenous On call to O.R. 06/10/13 0725 06/10/13 1010   06/10/13 0725  ciprofloxacin (CIPRO) IVPB 400 mg     400 mg 200 mL/hr over 60 Minutes Intravenous On call to O.R. 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 Active Problems:   Long-term current use of steroids-tolerating Prednisone    LOS: 6 days   Plan: Continue IVF and current diet.  Avoid carbonated drinks.  Cepacol for throat.  Check BMET tomorrow.  Stop Percocet.  Tylenol for pain.   [name] 06/16/2013

## 2013-06-17 LAB — BASIC METABOLIC PANEL
BUN: 20 mg/dL (ref 6–23)
CHLORIDE: 104 meq/L (ref 96–112)
CO2: 25 meq/L (ref 19–32)
Calcium: 8.6 mg/dL (ref 8.4–10.5)
Creatinine, Ser: 1.03 mg/dL (ref 0.50–1.35)
GFR calc Af Amer: 82 mL/min — ABNORMAL LOW (ref >90)
GFR calc non Af Amer: 71 mL/min — ABNORMAL LOW (ref >90)
Glucose, Bld: 111 mg/dL — ABNORMAL HIGH (ref 70–99)
Potassium: 3.6 mEq/L — ABNORMAL LOW (ref 3.7–5.3)
SODIUM: 141 meq/L (ref 137–147)

## 2013-06-17 NOTE — Progress Notes (Signed)
7 Days Post-Op  Subjective: Eating a little.  No significant pain.  Multiple BMs.  Objective: Vital signs in last 24 hours: Temp:  [98.7 F (37.1 C)-98.9 F (37.2 C)] 98.9 F (37.2 C) (03/03 0625) Pulse Rate:  [62-67] 62 (03/03 0625) Resp:  [16-18] 16 (03/03 0625) BP: (130-143)/(69-75) 143/74 mmHg (03/03 0625) SpO2:  [94 %-95 %] 95 % (03/03 0625) Last BM Date: 06/16/13  Intake/Output from previous day: 03/02 0701 - 03/03 0700 In: 720 [P.O.:720] Out: 490 [Urine:425; Drains:65] Intake/Output this shift: Total I/O In: 240 [P.O.:240] Out: -   PE: General- In NAD Abdomen-soft, slightly distended, few bowel sounds, incision clean and intact, serous drain output.  Lab Results:   Recent Labs  06/15/13 0556  WBC 9.0  HGB 8.6*  HCT 30.5*  PLT 238   BMET  Recent Labs  06/15/13 0556 06/17/13 0500  NA 137 141  K 4.2 3.6*  CL 99 104  CO2 27 25  GLUCOSE 139* 111*  BUN 17 20  CREATININE 0.97 1.03  CALCIUM 8.6 8.6   PT/INR No results found for this basename: LABPROT, INR,  in the last 72 hours Comprehensive Metabolic Panel:    Component Value Date/Time   NA 141 06/17/2013 0500   NA 137 06/15/2013 0556   K 3.6* 06/17/2013 0500   K 4.2 06/15/2013 0556   CL 104 06/17/2013 0500   CL 99 06/15/2013 0556   CO2 25 06/17/2013 0500   CO2 27 06/15/2013 0556   BUN 20 06/17/2013 0500   BUN 17 06/15/2013 0556   CREATININE 1.03 06/17/2013 0500   CREATININE 0.97 06/15/2013 0556   GLUCOSE 111* 06/17/2013 0500   GLUCOSE 139* 06/15/2013 0556   CALCIUM 8.6 06/17/2013 0500   CALCIUM 8.6 06/15/2013 0556   AST 16 06/03/2013 0915   AST 12 02/06/2013 0525   ALT 10 06/03/2013 0915   ALT 6 02/06/2013 0525   ALKPHOS 63 06/03/2013 0915   ALKPHOS 44 02/06/2013 0525   BILITOT 0.4 06/03/2013 0915   BILITOT 0.4 02/06/2013 0525   PROT 7.5 06/03/2013 0915   PROT 4.8* 02/06/2013 0525   ALBUMIN 3.4* 06/03/2013 0915   ALBUMIN 2.3* 02/06/2013 0525     Studies/Results: No results  found.  Anti-infectives: Anti-infectives   Start     Dose/Rate Route Frequency Ordered Stop   06/10/13 0725  metroNIDAZOLE (FLAGYL) IVPB 500 mg     500 mg 100 mL/hr over 60 Minutes Intravenous On call to O.R. 06/10/13 0725 06/10/13 1010   06/10/13 0725  ciprofloxacin (CIPRO) IVPB 400 mg     400 mg 200 mL/hr over 60 Minutes Intravenous On call to O.R. 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 Active Problems:   Long-term current use of steroids    LOS: 7 days   Plan: Heplock IV and give a trial of oral hydration and alimentation.  Repeat BMET tomorrow.   [name] J 06/17/2013

## 2013-06-18 LAB — BASIC METABOLIC PANEL
BUN: 19 mg/dL (ref 6–23)
CO2: 26 meq/L (ref 19–32)
Calcium: 8.7 mg/dL (ref 8.4–10.5)
Chloride: 102 mEq/L (ref 96–112)
Creatinine, Ser: 1.01 mg/dL (ref 0.50–1.35)
GFR calc Af Amer: 84 mL/min — ABNORMAL LOW (ref >90)
GFR calc non Af Amer: 72 mL/min — ABNORMAL LOW (ref >90)
GLUCOSE: 101 mg/dL — AB (ref 70–99)
POTASSIUM: 4.2 meq/L (ref 3.7–5.3)
Sodium: 140 mEq/L (ref 137–147)

## 2013-06-18 NOTE — Progress Notes (Signed)
8 Days Post-Op  Subjective: Tolerating diet and drinking plenty of fluids.  Objective: Vital signs in last 24 hours: Temp:  [98.1 F (36.7 C)-98.6 F (37 C)] 98.6 F (37 C) (03/04 0615) Pulse Rate:  [63-67] 63 (03/04 0615) Resp:  [16-18] 18 (03/04 0615) BP: (131-147)/(72-86) 147/86 mmHg (03/04 0615) SpO2:  [96 %] 96 % (03/04 0615) Last BM Date: 06/17/13  Intake/Output from previous day: 03/03 0701 - 03/04 0700 In: 1320 [P.O.:1320] Out: 260 [Urine:250; Drains:10] Intake/Output this shift:    PE: General- In NAD Abdomen-soft, not distended, incision clean and intact, serous drain output-drain removed,  Lab Results:  No results found for this basename: WBC, HGB, HCT, PLT,  in the last 72 hours BMET  Recent Labs  06/17/13 0500 06/18/13 0503  NA 141 140  K 3.6* 4.2  CL 104 102  CO2 25 26  GLUCOSE 111* 101*  BUN 20 19  CREATININE 1.03 1.01  CALCIUM 8.6 8.7   PT/INR No results found for this basename: LABPROT, INR,  in the last 72 hours Comprehensive Metabolic Panel:    Component Value Date/Time   NA 140 06/18/2013 0503   NA 141 06/17/2013 0500   K 4.2 06/18/2013 0503   K 3.6* 06/17/2013 0500   CL 102 06/18/2013 0503   CL 104 06/17/2013 0500   CO2 26 06/18/2013 0503   CO2 25 06/17/2013 0500   BUN 19 06/18/2013 0503   BUN 20 06/17/2013 0500   CREATININE 1.01 06/18/2013 0503   CREATININE 1.03 06/17/2013 0500   GLUCOSE 101* 06/18/2013 0503   GLUCOSE 111* 06/17/2013 0500   CALCIUM 8.7 06/18/2013 0503   CALCIUM 8.6 06/17/2013 0500   AST 16 06/03/2013 0915   AST 12 02/06/2013 0525   ALT 10 06/03/2013 0915   ALT 6 02/06/2013 0525   ALKPHOS 63 06/03/2013 0915   ALKPHOS 44 02/06/2013 0525   BILITOT 0.4 06/03/2013 0915   BILITOT 0.4 02/06/2013 0525   PROT 7.5 06/03/2013 0915   PROT 4.8* 02/06/2013 0525   ALBUMIN 3.4* 06/03/2013 0915   ALBUMIN 2.3* 02/06/2013 0525     Studies/Results: No results found.  Anti-infectives: Anti-infectives   Start     Dose/Rate Route Frequency Ordered Stop    06/10/13 0725  metroNIDAZOLE (FLAGYL) IVPB 500 mg     500 mg 100 mL/hr over 60 Minutes Intravenous On call to O.R. 06/10/13 0725 06/10/13 1010   06/10/13 0725  ciprofloxacin (CIPRO) IVPB 400 mg     400 mg 200 mL/hr over 60 Minutes Intravenous On call to O.R. 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  Instructions given.   Cassi Jenne J 06/18/2013

## 2013-06-18 NOTE — Progress Notes (Signed)
Pt was given AVS and verbalized understanding of all d/c instructions. Pt was to be taken home by his wife [name]. Pt has had no changes since admission. 32 staples were removed from his midline incision and steri strips applied to abdomen. Pt was hopeful of a speedy recovery. Pt was wheeled down by CNA. Pt has had no changes since AM assessment.   [name], [name] Gruenhagen L 06/18/2013 1:15 PM

## 2013-06-18 NOTE — Discharge Instructions (Signed)
[facility], [place] [phone]  OPEN ABDOMINAL SURGERY: POST OP INSTRUCTIONS  Always review your discharge instruction sheet given to you by the facility where your surgery was performed.  IF YOU HAVE DISABILITY OR FAMILY LEAVE FORMS, YOU MUST BRING THEM TO THE OFFICE FOR PROCESSING.  PLEASE DO NOT GIVE THEM TO YOUR DOCTOR.  1. A prescription for pain medication may be given to you upon discharge.  Take your pain medication as prescribed, if needed.  If narcotic pain medicine is not needed, then you may take acetaminophen (Tylenol) or ibuprofen (Advil) as needed. 2. Take your usually prescribed medications unless otherwise directed. 3. If you need a refill on your pain medication, please contact your pharmacy. They will contact our office to request authorization.  Prescriptions will not be filled after 5pm or on week-ends. 4. You should follow a light diet the first few days after arrival home, such as soup and crackers, pudding, etc.unless your doctor has advised otherwise. A high-fiber, low fat diet can be resumed as tolerated.   Be sure to include lots of fluids daily. Most patients will experience some swelling and bruising on the chest and neck area.  Ice packs will help.  Swelling and bruising can take several days to resolve 5. Most patients will experience some swelling and bruising in the area of the incision. Ice pack will help. Swelling and bruising can take several days to resolve..  6. It is common to experience some constipation if taking pain medication after surgery.  Increasing fluid intake and taking a stool softener will usually help or prevent this problem from occurring.  A mild laxative (Milk of Magnesia or Miralax) should be taken according to package directions if there are no bowel movements after 48 hours. 7.  You may have steri-strips (small skin tapes) in place directly over the incision.  These strips should be left on the skin.  If your surgeon used  skin glue on the incision, you may shower in 24 hours.  The glue will flake off over the next 2-3 weeks.  Any sutures or staples will be removed at the office during your follow-up visit. You may find that a light gauze bandage over your incision may keep your staples from being rubbed or pulled. You may shower and replace the bandage daily. 8. ACTIVITIES:  You may resume regular (light) daily activities beginning the next day--such as daily self-care, walking, climbing stairs--gradually increasing activities as tolerated.  You may have sexual intercourse when it is comfortable.  Refrain from any heavy lifting or straining-nothing over 10 pounds for 2 months.  a. You may drive when you no longer are taking prescription pain medication, you can comfortably wear a seatbelt, and you can safely maneuver your car and apply brakes b. Return to Work: ___________________________________ 16. You should see your doctor in the office for a follow-up appointment approximately two weeks after your surgery.  Make sure that you call for this appointment within a day or two after you arrive home to insure a convenient appointment time. OTHER INSTRUCTIONS:  Dry bandages to drain site and lower part of incision-change daily.  If you have more that 10 bowel movements a day, may use Imodium carefully.  Drink 2 quarts of Gatorade/water mixture per day._____________________________________________________________ _____________________________________________________________  WHEN TO CALL YOUR DOCTOR: 1. Fever over 101.0 2. Inability to urinate 3. Nausea and/or vomiting 4. Extreme swelling or bruising 5. Continued bleeding from incision. 6. Increased pain, redness,  or drainage from the incision.  The clinic staff is available to answer your questions during regular business hours.  Please dont hesitate to call and ask to speak to one of the nurses if you have concerns.  For further questions, please visit  [email or web address]

## 2013-07-03 NOTE — Discharge Summary (Signed)
Physician Discharge Summary  Patient ID: [name] MRN: [id number] DOB/AGE: 1941/04/12 73 y.o.  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 Postoperatively, he developed an ileus that was slow to resolve. This eventually did improve allowing him to be placed on a diet.  He began having multiple loose bowel movements but eventually show some consistency. He was able to tolerate his diet and maintaining his hydration. His wound healed well. There is no evidence of a postoperative complication. He was able to be discharged on 06/18/2013. Discharge instructions were given to him.  He will call for a followup appointment in the office.  Consults: None  Significant Diagnostic Studies: none  Treatments: surgery: Subtotal colectomy  Discharge Exam: Blood pressure 147/86, pulse 63, temperature 98.6 F (37 C), temperature source Oral, resp. rate 18, height 5' 7.5" (1.715 m), weight 183 lb 13.8 oz (83.4 kg), SpO2 96.00%.  Disposition: 01-Home or Self Care   Future Appointments Provider Department Dept Phone   07/09/2013 10:50 AM Odis Hollingshead, MD [facility], [place] [phone]       Medication List    STOP taking these medications       leflunomide 20 MG tablet  Commonly known as:  ARAVA      TAKE these medications       acetaminophen 650 MG CR tablet  Commonly known as:  TYLENOL  Take 1,300 mg by mouth 2 (two) times daily.     ICAPS Caps  Take 1 capsule by mouth daily.     omeprazole 20 MG capsule  Commonly known as:  PRILOSEC   Take 20 mg by mouth daily.     predniSONE 5 MG tablet  Commonly known as:  DELTASONE  Take 5 mg by mouth daily.     SIMILASAN ALLERGY EYE RELIEF OP  Apply 1 drop to eye 2 (two) times daily as needed (allergies).     traMADol 50 MG tablet  Commonly known as:  ULTRAM  Take 50 mg by mouth 2 (two) times daily.         Signed: Odis Hollingshead 07/03/2013, 1:56 PM

## 2013-07-09 ENCOUNTER — Ambulatory Visit (INDEPENDENT_AMBULATORY_CARE_PROVIDER_SITE_OTHER): Payer: Medicare Other | Admitting: General Surgery

## 2013-07-09 ENCOUNTER — Encounter (INDEPENDENT_AMBULATORY_CARE_PROVIDER_SITE_OTHER): Payer: Self-pay | Admitting: General Surgery

## 2013-07-09 VITALS — BP 150/60 | HR 76 | Resp 16 | Ht 67.0 in | Wt 165.6 lb

## 2013-07-09 DIAGNOSIS — Z4889 Encounter for other specified surgical aftercare: Secondary | ICD-10-CM

## 2013-07-09 NOTE — Progress Notes (Signed)
Procedure:  Laparoscopic-assisted total colectomy  Date:  06/10/2013  Pathology:  Diverticular disease  History:  He is here for a postoperative visit and is doing fairly well. He's had some clear drainage coming from the upper portion of his incision. He is eating well. He is having 4-5 bowel movements a day. He is not having any problems with his arthritis.  Exam: General- Is in NAD. Abdomen-soft, midline incision is clean and intact. There is no drainage today. There is one area of granulation tissue which I treated with silver nitrate.  Assessment:  He is progressing well postoperatively.  Plan:  Continue light activities. Wound care instructions were given to him. Return visit one month.

## 2013-07-09 NOTE — Patient Instructions (Signed)
Take a shower daily and apply dry dressing to the open areas. Continue light activities.

## 2013-08-05 ENCOUNTER — Encounter (INDEPENDENT_AMBULATORY_CARE_PROVIDER_SITE_OTHER): Payer: Self-pay | Admitting: General Surgery

## 2013-08-05 ENCOUNTER — Ambulatory Visit (INDEPENDENT_AMBULATORY_CARE_PROVIDER_SITE_OTHER): Payer: Medicare Other | Admitting: General Surgery

## 2013-08-05 VITALS — BP 140/70 | HR 74 | Temp 97.7°F | Resp 16 | Ht 67.0 in | Wt 169.2 lb

## 2013-08-05 DIAGNOSIS — Z4889 Encounter for other specified surgical aftercare: Secondary | ICD-10-CM

## 2013-08-05 NOTE — Patient Instructions (Signed)
Resume normal activities as tolerated, as discussed. Avoid weight gain.

## 2013-08-05 NOTE — Progress Notes (Signed)
Procedure:  Laparoscopic-assisted total colectomy  Date:  06/10/2013  Pathology:  Diverticular disease  History:  He is here for a second postoperative visit.  His energy level is slowly improving. He is eating well. He is having less than 5 bowel movements a day. Exam: General- Is in NAD. Abdomen-soft, midline incision is clean and intact and solid.  Assessment:  He continues to do well postoperatively.  Plan:  Resume normal activities as tolerated. Return visit as needed.

## 2014-08-11 ENCOUNTER — Other Ambulatory Visit: Payer: Self-pay | Admitting: Geriatric Medicine

## 2014-08-11 DIAGNOSIS — E041 Nontoxic single thyroid nodule: Secondary | ICD-10-CM

## 2014-08-18 ENCOUNTER — Ambulatory Visit
Admission: RE | Admit: 2014-08-18 | Discharge: 2014-08-18 | Disposition: A | Discharge status: Home / Self Care | Payer: PPO | Source: Ambulatory Visit | Attending: Geriatric Medicine | Admitting: Geriatric Medicine

## 2014-08-18 DIAGNOSIS — E041 Nontoxic single thyroid nodule: Secondary | ICD-10-CM

## 2014-09-03 ENCOUNTER — Ambulatory Visit (INDEPENDENT_AMBULATORY_CARE_PROVIDER_SITE_OTHER): Payer: PPO

## 2014-09-03 ENCOUNTER — Encounter: Payer: Self-pay | Admitting: Podiatry

## 2014-09-03 ENCOUNTER — Ambulatory Visit (INDEPENDENT_AMBULATORY_CARE_PROVIDER_SITE_OTHER): Payer: PPO | Admitting: Podiatry

## 2014-09-03 VITALS — BP 164/92 | HR 63 | Resp 12

## 2014-09-03 DIAGNOSIS — M779 Enthesopathy, unspecified: Secondary | ICD-10-CM | POA: Diagnosis not present

## 2014-09-03 DIAGNOSIS — M79672 Pain in left foot: Secondary | ICD-10-CM | POA: Diagnosis not present

## 2014-09-03 DIAGNOSIS — M79671 Pain in right foot: Secondary | ICD-10-CM

## 2014-09-03 DIAGNOSIS — Q6650 Congenital pes planus, unspecified foot: Secondary | ICD-10-CM

## 2014-09-03 NOTE — Progress Notes (Signed)
   Subjective:    Patient ID: [name], male    DOB: 03/01/1941, 74 y.o.   MRN: [id number]  HPI PT STATED HAVE FLAT FEET AND HAVE KNOT MEDIAL SIDE OF THE FOOT BEEN PAINFUL 6 YEARS. FEET/TOES HAVE  NUMBNESS AND SOMETIMES SHARP PAIN ON CERTAIN AREA OF THE FOOT. FEET GET AGGRAVATED BY WALKING BARE FEET. TRIED TO WEAR OTC INSERTS BUT NO HELP.   Review of Systems  HENT: Positive for trouble swallowing.   Musculoskeletal: Positive for myalgias, back pain, joint swelling and gait problem.  Neurological: Positive for dizziness and weakness.  Hematological: Bruises/bleeds easily.  All other systems reviewed and are negative.      Objective:   Physical Exam        Assessment & Plan:

## 2014-09-04 ENCOUNTER — Other Ambulatory Visit: Payer: Self-pay | Admitting: Rheumatology

## 2014-09-04 ENCOUNTER — Ambulatory Visit
Admission: RE | Admit: 2014-09-04 | Discharge: 2014-09-04 | Disposition: A | Discharge status: Home / Self Care | Payer: PPO | Source: Ambulatory Visit | Attending: Rheumatology | Admitting: Rheumatology

## 2014-09-04 DIAGNOSIS — M79605 Pain in left leg: Secondary | ICD-10-CM

## 2014-09-04 NOTE — Progress Notes (Signed)
Subjective:     Patient ID: [name], male   DOB: 1940-07-31, 73 y.o.   MRN: [id number]  HPI patient presents stating I just gets some pain in my feet both feet and I wanted to have it checked to see if there's any ideas that you haven't   Review of Systems  All other systems reviewed and are negative.      Objective:   Physical Exam  Constitutional: He is oriented to person, place, and time.  Cardiovascular: Intact distal pulses.   Musculoskeletal: Normal range of motion.  Neurological: He is oriented to person, place, and time.  Skin: Skin is warm.  Nursing note and vitals reviewed.  vascular status intact muscle strength adequate range of motion within normal limits. Patient's noted to have pain in both feet of a mild nature with moderate depression of the arch and corn callus formation with moderate exposure of the underlying metatarsal bones     Assessment:     Foot structural issues with moderate exposure metatarsal bones and structural deformity of digits and first metatarsal    Plan:     H&P and x-rays reviewed. Today I recommended thick padding for his shoes and not going barefoot and possible orthotics if symptoms persist

## 2014-09-23 ENCOUNTER — Other Ambulatory Visit (INDEPENDENT_AMBULATORY_CARE_PROVIDER_SITE_OTHER): Payer: PPO

## 2014-09-30 ENCOUNTER — Other Ambulatory Visit: Payer: PPO

## 2014-10-12 ENCOUNTER — Other Ambulatory Visit: Payer: Self-pay

## 2014-10-28 ENCOUNTER — Ambulatory Visit (INDEPENDENT_AMBULATORY_CARE_PROVIDER_SITE_OTHER): Payer: PPO | Admitting: *Deleted

## 2014-10-28 DIAGNOSIS — M79671 Pain in right foot: Secondary | ICD-10-CM

## 2014-10-28 DIAGNOSIS — Q6650 Congenital pes planus, unspecified foot: Secondary | ICD-10-CM

## 2014-10-28 DIAGNOSIS — M779 Enthesopathy, unspecified: Secondary | ICD-10-CM | POA: Diagnosis not present

## 2014-10-28 NOTE — Progress Notes (Signed)
Patient ID: [name], male   DOB: 04/28/1940, 74 y.o.   MRN: [id number] Patient presents for casting of [place] brace with Betha Cped.  Patient will follow up in 4 weeks for fitting.

## 2014-12-08 ENCOUNTER — Ambulatory Visit: Payer: PPO | Admitting: Podiatry

## 2015-05-14 ENCOUNTER — Encounter (HOSPITAL_COMMUNITY): Payer: Self-pay | Admitting: *Deleted

## 2015-05-14 ENCOUNTER — Emergency Department (HOSPITAL_COMMUNITY): Payer: Medicare Other

## 2015-05-14 ENCOUNTER — Emergency Department (HOSPITAL_COMMUNITY)
Admission: EM | Admit: 2015-05-14 | Discharge: 2015-05-14 | Disposition: A | Discharge status: Home / Self Care | Payer: Medicare Other | Attending: Emergency Medicine | Admitting: Emergency Medicine

## 2015-05-14 DIAGNOSIS — Z880 Allergy status to penicillin: Secondary | ICD-10-CM | POA: Diagnosis not present

## 2015-05-14 DIAGNOSIS — Z7952 Long term (current) use of systemic steroids: Secondary | ICD-10-CM | POA: Diagnosis not present

## 2015-05-14 DIAGNOSIS — Z87442 Personal history of urinary calculi: Secondary | ICD-10-CM | POA: Insufficient documentation

## 2015-05-14 DIAGNOSIS — R251 Tremor, unspecified: Secondary | ICD-10-CM | POA: Diagnosis not present

## 2015-05-14 DIAGNOSIS — R9431 Abnormal electrocardiogram [ECG] [EKG]: Secondary | ICD-10-CM | POA: Insufficient documentation

## 2015-05-14 DIAGNOSIS — Z79899 Other long term (current) drug therapy: Secondary | ICD-10-CM | POA: Diagnosis not present

## 2015-05-14 DIAGNOSIS — M069 Rheumatoid arthritis, unspecified: Secondary | ICD-10-CM | POA: Diagnosis not present

## 2015-05-14 DIAGNOSIS — R42 Dizziness and giddiness: Secondary | ICD-10-CM | POA: Diagnosis not present

## 2015-05-14 DIAGNOSIS — R110 Nausea: Secondary | ICD-10-CM | POA: Diagnosis not present

## 2015-05-14 DIAGNOSIS — G8929 Other chronic pain: Secondary | ICD-10-CM | POA: Insufficient documentation

## 2015-05-14 DIAGNOSIS — R5383 Other fatigue: Secondary | ICD-10-CM | POA: Diagnosis present

## 2015-05-14 DIAGNOSIS — K219 Gastro-esophageal reflux disease without esophagitis: Secondary | ICD-10-CM | POA: Diagnosis not present

## 2015-05-14 DIAGNOSIS — Z8701 Personal history of pneumonia (recurrent): Secondary | ICD-10-CM | POA: Diagnosis not present

## 2015-05-14 DIAGNOSIS — R61 Generalized hyperhidrosis: Secondary | ICD-10-CM | POA: Diagnosis not present

## 2015-05-14 DIAGNOSIS — Z87438 Personal history of other diseases of male genital organs: Secondary | ICD-10-CM | POA: Insufficient documentation

## 2015-05-14 LAB — URINALYSIS, ROUTINE W REFLEX MICROSCOPIC
Bilirubin Urine: NEGATIVE
GLUCOSE, UA: NEGATIVE mg/dL
HGB URINE DIPSTICK: NEGATIVE
KETONES UR: NEGATIVE mg/dL
LEUKOCYTES UA: NEGATIVE
Nitrite: NEGATIVE
PROTEIN: NEGATIVE mg/dL
Specific Gravity, Urine: 1.018 (ref 1.005–1.030)
pH: 5 (ref 5.0–8.0)

## 2015-05-14 LAB — CBC
HCT: 46.2 % (ref 39.0–52.0)
Hemoglobin: 14.7 g/dL (ref 13.0–17.0)
MCH: 27.3 pg (ref 26.0–34.0)
MCHC: 31.8 g/dL (ref 30.0–36.0)
MCV: 85.7 fL (ref 78.0–100.0)
PLATELETS: 218 10*3/uL (ref 150–400)
RBC: 5.39 MIL/uL (ref 4.22–5.81)
RDW: 19.2 % — AB (ref 11.5–15.5)
WBC: 7.5 10*3/uL (ref 4.0–10.5)

## 2015-05-14 LAB — BASIC METABOLIC PANEL
Anion gap: 9 (ref 5–15)
BUN: 14 mg/dL (ref 6–20)
CHLORIDE: 106 mmol/L (ref 101–111)
CO2: 28 mmol/L (ref 22–32)
CREATININE: 1.05 mg/dL (ref 0.61–1.24)
Calcium: 9.2 mg/dL (ref 8.9–10.3)
GFR calc Af Amer: >60 mL/min (ref >60)
GFR calc non Af Amer: >60 mL/min (ref >60)
Glucose, Bld: 112 mg/dL — ABNORMAL HIGH (ref 65–99)
Potassium: 5 mmol/L (ref 3.5–5.1)
SODIUM: 143 mmol/L (ref 135–145)

## 2015-05-14 LAB — I-STAT TROPONIN, ED: Troponin i, poc: 0.01 ng/mL (ref 0.00–0.08)

## 2015-05-14 LAB — SEDIMENTATION RATE: Sed Rate: 3 mm/hr (ref 0–16)

## 2015-05-14 NOTE — ED Notes (Signed)
Pt departed in NAD.  

## 2015-05-14 NOTE — ED Notes (Signed)
MD at bedside. 

## 2015-05-14 NOTE — Discharge Instructions (Signed)
Fatigue  Fatigue is feeling tired all of the time, a lack of energy, or a lack of motivation. Occasional or mild fatigue is often a normal response to activity or life in general. However, long-lasting (chronic) or extreme fatigue may indicate an underlying medical condition.  HOME CARE INSTRUCTIONS   Watch your fatigue for any changes. The following actions may help to lessen any discomfort you are feeling:  · Talk to your health care provider about how much sleep you need each night. Try to get the required amount every night.  · Take medicines only as directed by your health care provider.  · Eat a healthy and nutritious diet. Ask your health care provider if you need help changing your diet.  · Drink enough fluid to keep your urine clear or pale yellow.  · Practice ways of relaxing, such as yoga, meditation, massage therapy, or acupuncture.  · Exercise regularly.    · Change situations that cause you stress. Try to keep your work and personal routine reasonable.  · Do not abuse illegal drugs.  · Limit alcohol intake to no more than 1 drink per day for nonpregnant women and 2 drinks per day for men. One drink equals 12 ounces of beer, 5 ounces of wine, or 1½ ounces of hard liquor.  · Take a multivitamin, if directed by your health care provider.  SEEK MEDICAL CARE IF:   · Your fatigue does not get better.  · You have a fever.    · You have unintentional weight loss or gain.  · You have headaches.    · You have difficulty:      Falling asleep.    Sleeping throughout the night.  · You feel angry, guilty, anxious, or sad.     · You are unable to have a bowel movement (constipation).    · You skin is dry.     · Your legs or another part of your body is swollen.    SEEK IMMEDIATE MEDICAL CARE IF:   · You feel confused.    · Your vision is blurry.  · You feel faint or pass out.    · You have a severe headache.    · You have severe abdominal, pelvic, or back pain.    · You have chest pain, shortness of breath, or an  irregular or fast heartbeat.    · You are unable to urinate or you urinate less than normal.    · You develop abnormal bleeding, such as bleeding from the rectum, vagina, nose, lungs, or nipples.  · You vomit blood.     · You have thoughts about harming yourself or committing suicide.    · You are worried that you might harm someone else.       This information is not intended to replace advice given to you by your health care provider. Make sure you discuss any questions you have with your health care provider.     Document Released: 01/29/2007 Document Revised: 04/24/2014 Document Reviewed: 08/05/2013  Elsevier Interactive Patient Education ©2016 [employer] Inc[employer]

## 2015-05-14 NOTE — ED Provider Notes (Signed)
CSN: [id number]     Arrival date & time 05/14/15  1703 History   First MD Initiated Contact with Patient 05/14/15 1745     Chief Complaint  Patient presents with  . Fatigue  . Abnormal ECG     (Consider location/radiation/quality/duration/timing/severity/associated sxs/prior Treatment) HPI Comments: 75yo M w/ PMH including rheumatoid arthritis, GERD, BPH who presents with weakness. 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 He denies any chest pain or shortness of breath during the episode but does report that he had some nausea, lightheadedness, and shakiness with it. He states he has chronic headaches originating from the back of his neck but since the episode he has had intermittent headaches this week that go to the front of his head and arm behind his right eye. The pain is throbbing and worse at night. He also reports intermittent problems focusing out of his right eye. No vomiting, diarrhea, abdominal pain, or fevers. He denies any further episodes of sweatiness, he states that he has just felt generally weak and tired since then. He was seen by his PCP today and sent here due to concerns for an abnormal EKG.  The history is provided by the patient.    Past Medical History  Diagnosis Date  . Bursitis     chronic hip pain  . BPH (benign prostatic hyperplasia)   . Blood transfusion     " no reaction from transfusion"  . GERD (gastroesophageal reflux disease)   . Spondylisthesis   . Osteoporosis   . Bruises easily   . Diverticulitis   . Rash     GROIN - ITCHING PAST 3 MONTHS  . History of kidney stones   . History of GI bleed     "vessels burst in colon" x2  . Rheumatoid arthritis(714.0)     27 years  . Osteoarthritis   . Disc disease, degenerative, lumbar or lumbosacral     Chronic back pain now  . History of vertigo   . Pneumonia     yrs ago  . Esophageal  ring     requiring dilations    Past Surgical History  Procedure Laterality Date  . Total knee arthroplasty  2003    Bilateral  . Nissen fundoplication      about 9371, pt not sure records not currently available  . Lithotripsy for nephrolithiasis      Pt and wife not sure when but here in [place]  . Lysis of adhesion, small bowel resection  2009  . Hernia repair    . Inguinal hernia repair  2007    Bilateral   . Ventral hernia repair with mesh    . Esophagogastroduodenoscopy N/A 10/01/2012    Procedure: ESOPHAGOGASTRODUODENOSCOPY (EGD);  Surgeon: Garlan Fair, MD;  Location: [facility];  Service: Endoscopy;  Laterality: N/A;  . Balloon dilation N/A 10/01/2012    Procedure: BALLOON DILATION;  Surgeon: Garlan Fair, MD;  Location: [facility];  Service: Endoscopy;  Laterality: N/A;  . Cystoscopy with ureteroscopy Right 12/05/2012    Procedure: CYSTOSCOPY WITH RIGHT URETEROSCOPY AND STONE EXTRACTION;  Surgeon: Malka So, MD;  Location: [facility];  Service: Urology;  Laterality: Right;  . Transurethral prostatectomy with gyrus instruments N/A 12/05/2012    Procedure: TRANSURETHRAL PROSTATECTOMY WITH GYRUS INSTRUMENTS;  Surgeon: Malka So, MD;  Location: [facility];  Service: Urology;  Laterality: N/A;  .  Colectomy N/A 06/10/2013    Procedure: TOTAL abdominal COLECTOMY;  Surgeon: Odis Hollingshead, MD;  Location: [facility];  Service: General;  Laterality: N/A;   History reviewed. No pertinent family history. Social History  Substance Use Topics  . Smoking status: Never Smoker   . Smokeless tobacco: Never Used  . Alcohol Use: No    Review of Systems 10 Systems reviewed and are negative for acute change except as noted in the HPI.    Allergies  Enbrel; Clindamycin/lincomycin; Codeine; and Penicillins  Home Medications   Prior to Admission medications   Medication Sig Start Date End Date Taking? Authorizing Provider  acetaminophen (TYLENOL) 650 MG CR tablet Take 1,300 mg  by mouth 2 (two) times daily.   Yes Historical Provider, MD  Iron TABS Take 1 tablet by mouth daily.   Yes Historical Provider, MD  leflunomide (ARAVA) 20 MG tablet Take 20 mg by mouth daily.  07/16/13  Yes Historical Provider, MD  Multiple Vitamins-Minerals (ICAPS) CAPS Take 1 capsule by mouth daily.   Yes Historical Provider, MD  omeprazole (PRILOSEC) 20 MG capsule Take 20 mg by mouth daily.     Yes Historical Provider, MD  predniSONE (DELTASONE) 5 MG tablet Take 5 mg by mouth daily.   Yes Historical Provider, MD  traMADol (ULTRAM) 50 MG tablet Take 50 mg by mouth 2 (two) times daily.   Yes Historical Provider, MD   BP 168/83 mmHg  Pulse 60  Temp(Src) 98 F (36.7 C) (Oral)  Resp 23  Ht 5' 7.5" (1.715 m)  Wt 172 lb (78.019 kg)  BMI 26.53 kg/m2  SpO2 94% Physical Exam  Constitutional: He is oriented to person, place, and time. He appears well-developed and well-nourished. No distress.  Awake, alert  HENT:  Head: Normocephalic and atraumatic.  Eyes: Conjunctivae and EOM are normal. Pupils are equal, round, and reactive to light.  Neck: Neck supple.  Cardiovascular: Normal rate, regular rhythm and normal heart sounds.   No murmur heard. Pulmonary/Chest: Effort normal and breath sounds normal. No respiratory distress.  Abdominal: Soft. Bowel sounds are normal. He exhibits no distension.  Musculoskeletal: He exhibits no edema.  Neurological: He is alert and oriented to person, place, and time. He has normal reflexes. No cranial nerve deficit. He exhibits normal muscle tone.  Fluent speech, normal finger-to-nose testing, negative pronator drift; normal strength and sensation throughout  Skin: Skin is warm and dry.  Psychiatric: He has a normal mood and affect. Judgment and thought content normal.  Nursing note and vitals reviewed.   ED Course  Procedures (including critical care time) Labs Review Labs Reviewed  BASIC METABOLIC PANEL - Abnormal; Notable for the following:    Glucose,  Bld 112 (*)    All other components within normal limits  CBC - Abnormal; Notable for the following:    RDW 19.2 (*)    All other components within normal limits  URINALYSIS, ROUTINE W REFLEX MICROSCOPIC (NOT AT [facility])  SEDIMENTATION RATE  Randolm Idol, ED    Imaging Review Dg Chest 2 View  05/14/2015  CLINICAL DATA:  Fatigue. Recent episode of diaphoresis. Shortness of breath, weakness, dizziness, dry mouth, right and left anterior chest tenderness on palpation, and headache since Monday. 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 No focal airspace disease or consolidation in the lungs. No blunting of costophrenic angles. No  pneumothorax. Mediastinal contours appear intact. Degenerative changes in the spine. Mild anterior wedge deformities of mid thoracic vertebrae, unchanged since prior study. Surgical clips in the upper abdomen. IMPRESSION: No active cardiopulmonary disease. Electronically Signed   By: Lucienne Capers M.D.   On: 05/14/2015 18:48   Ct Head Wo Contrast  05/14/2015  CLINICAL DATA:  Episode of sweating, dizziness, weakness and pain the posterior aspect of the head on the right radiating into the right eye today. Continued headache. Initial encounter. EXAM: CT HEAD WITHOUT CONTRAST TECHNIQUE: Contiguous axial images were obtained from the base of the skull through the vertex without intravenous contrast. COMPARISON:  None. FINDINGS: There is cortical atrophy and chronic microvascular ischemic change. No evidence of acute intracranial bleeding hemorrhage, infarct, mass lesion, mass effect, midline shift or abnormal extra-axial fluid collection is identified. No hydrocephalus or pneumocephalus. The calvarium is intact. IMPRESSION: No acute abnormality. Atrophy and chronic microvascular ischemic change. Electronically Signed   By: Inge Rise M.D.   On: 05/14/2015 19:26   I have personally reviewed and evaluated these lab results as part of my medical decision-making.   EKG Interpretation   Date/Time:  Friday May 14 2015 17:09:34 EST Ventricular Rate:  66 PR Interval:  144 QRS Duration: 94 QT Interval:  390 QTC Calculation: 408 R Axis:   -25 Text Interpretation:  Normal sinus rhythm Moderate voltage criteria for  LVH, may be normal variant Cannot rule out Septal infarct , age  undetermined Abnormal ECG new T wave inversion III, otherwise no  significant change from previous Confirmed by LITTLE MD, RACHEL [phone]) on  05/14/2015 5:49:36 PM      MDM   Final diagnoses:  Diaphoresis  Other fatigue   Pt presents with a 15 minute episode of sweatiness that occurred several days ago and has not reoccurred but he has continued to have generalized weakness, fatigue, and intermittent headaches including pain behind his right eye. Wife later notes that he has had several weeks of headaches originating from neck. On exam, he was well-appearing. Vital signs notable for hypertension. Patient states no history of hypertension and his blood pressure has been normal in the clinic twice this week. Normal neurologic exam. Visual acuity 20/20 R eye. EKG shows no significant changes from previous. Obtained above lab work including ESR. Labs showed normal troponin, unremarkable CBC and BMP, normal ESR and thus temporal arteritis seems very unlikely. Chest x-ray negative for acute process and obtained head CT which was also negative for acute process.   Tested IOP with tonopen which was technically difficult but obtained pressure readings of 10 R eye, 6 L eye. Thus, acute angle closure glaucoma very unlikely. ESR normal, thus temporal arteritis very unlikely. On reexamination, the patient is well-appearing and denies any complaints. Because he has had no reoccurrence of his diaphoresis since several days ago and later states that he has  chronic problems with profuse sweating, I feel that he is safe for discharge home. I have emphasized the importance of following with his PCP regarding any other symptoms and return precautions for any life-threatening symptoms including chest pain or shortness of breath. Regarding his headaches, I have also instructed him to keep his PCP up to date on his symptoms and to schedule a follow-up appt with his ophthalmologist. Patient voiced understanding of plan and was discharged in satisfactory condition.  Sharlett Iles, MD 05/14/15 [phone]

## 2015-05-14 NOTE — ED Notes (Signed)
Pt reports onset last Monday of diaphoresis lasting approx 15 mins and fatigue/generalized weakness since. Also having headache this week. Went to pcp and sent here due to abnormal ekg. Denies any cp or sob.

## 2015-05-14 NOTE — ED Notes (Signed)
EDP at bedside  

## 2015-06-20 ENCOUNTER — Emergency Department (HOSPITAL_COMMUNITY): Payer: Medicare Other

## 2015-06-20 ENCOUNTER — Emergency Department (HOSPITAL_COMMUNITY)
Admission: EM | Admit: 2015-06-20 | Discharge: 2015-06-20 | Disposition: A | Discharge status: Home / Self Care | Payer: Medicare Other | Attending: Emergency Medicine | Admitting: Emergency Medicine

## 2015-06-20 DIAGNOSIS — Z8701 Personal history of pneumonia (recurrent): Secondary | ICD-10-CM | POA: Diagnosis not present

## 2015-06-20 DIAGNOSIS — Z880 Allergy status to penicillin: Secondary | ICD-10-CM | POA: Insufficient documentation

## 2015-06-20 DIAGNOSIS — K222 Esophageal obstruction: Secondary | ICD-10-CM | POA: Insufficient documentation

## 2015-06-20 DIAGNOSIS — G8929 Other chronic pain: Secondary | ICD-10-CM | POA: Insufficient documentation

## 2015-06-20 DIAGNOSIS — Z7952 Long term (current) use of systemic steroids: Secondary | ICD-10-CM | POA: Insufficient documentation

## 2015-06-20 DIAGNOSIS — Z79899 Other long term (current) drug therapy: Secondary | ICD-10-CM | POA: Diagnosis not present

## 2015-06-20 DIAGNOSIS — M1990 Unspecified osteoarthritis, unspecified site: Secondary | ICD-10-CM | POA: Diagnosis not present

## 2015-06-20 DIAGNOSIS — R1110 Vomiting, unspecified: Secondary | ICD-10-CM | POA: Diagnosis present

## 2015-06-20 DIAGNOSIS — M069 Rheumatoid arthritis, unspecified: Secondary | ICD-10-CM | POA: Diagnosis not present

## 2015-06-20 DIAGNOSIS — Z87438 Personal history of other diseases of male genital organs: Secondary | ICD-10-CM | POA: Insufficient documentation

## 2015-06-20 DIAGNOSIS — K219 Gastro-esophageal reflux disease without esophagitis: Secondary | ICD-10-CM | POA: Insufficient documentation

## 2015-06-20 DIAGNOSIS — Z87442 Personal history of urinary calculi: Secondary | ICD-10-CM | POA: Diagnosis not present

## 2015-06-20 LAB — COMPREHENSIVE METABOLIC PANEL
ALBUMIN: 3.8 g/dL (ref 3.5–5.0)
ALT: 12 U/L — ABNORMAL LOW (ref 17–63)
AST: 20 U/L (ref 15–41)
Alkaline Phosphatase: 71 U/L (ref 38–126)
Anion gap: 12 (ref 5–15)
BILIRUBIN TOTAL: 0.9 mg/dL (ref 0.3–1.2)
BUN: 17 mg/dL (ref 6–20)
CO2: 24 mmol/L (ref 22–32)
Calcium: 8.6 mg/dL — ABNORMAL LOW (ref 8.9–10.3)
Chloride: 103 mmol/L (ref 101–111)
Creatinine, Ser: 1 mg/dL (ref 0.61–1.24)
GFR calc Af Amer: >60 mL/min (ref >60)
GFR calc non Af Amer: >60 mL/min (ref >60)
GLUCOSE: 108 mg/dL — AB (ref 65–99)
POTASSIUM: 4.4 mmol/L (ref 3.5–5.1)
SODIUM: 139 mmol/L (ref 135–145)
TOTAL PROTEIN: 7.8 g/dL (ref 6.5–8.1)

## 2015-06-20 LAB — CBC
HEMATOCRIT: 48.2 % (ref 39.0–52.0)
Hemoglobin: 15.9 g/dL (ref 13.0–17.0)
MCH: 29 pg (ref 26.0–34.0)
MCHC: 33 g/dL (ref 30.0–36.0)
MCV: 88 fL (ref 78.0–100.0)
Platelets: 225 10*3/uL (ref 150–400)
RBC: 5.48 MIL/uL (ref 4.22–5.81)
RDW: 15.2 % (ref 11.5–15.5)
WBC: 10 10*3/uL (ref 4.0–10.5)

## 2015-06-20 LAB — URINE MICROSCOPIC-ADD ON
Bacteria, UA: NONE SEEN
RBC / HPF: NONE SEEN RBC/hpf (ref 0–5)

## 2015-06-20 LAB — URINALYSIS, ROUTINE W REFLEX MICROSCOPIC
Glucose, UA: NEGATIVE mg/dL
Hgb urine dipstick: NEGATIVE
KETONES UR: 40 mg/dL — AB
Leukocytes, UA: NEGATIVE
NITRITE: NEGATIVE
PH: 5.5 (ref 5.0–8.0)
Protein, ur: 30 mg/dL — AB
Specific Gravity, Urine: 1.026 (ref 1.005–1.030)

## 2015-06-20 LAB — LIPASE, BLOOD: LIPASE: 20 U/L (ref 11–51)

## 2015-06-20 MED ORDER — ALUM & MAG HYDROXIDE-SIMETH 200-200-20 MG/5ML PO SUSP
15.0000 mL | Freq: Once | ORAL | Status: DC
  Filled 2015-06-20: qty 30

## 2015-06-20 MED ORDER — SODIUM CHLORIDE 0.9 % IV BOLUS (SEPSIS)
1000.0000 mL | Freq: Once | INTRAVENOUS | Status: AC
  Administered 2015-06-20: 1000 mL via INTRAVENOUS

## 2015-06-20 MED ORDER — ONDANSETRON HCL 4 MG/2ML IJ SOLN
4.0000 mg | Freq: Once | INTRAMUSCULAR | Status: AC
  Administered 2015-06-20: 4 mg via INTRAVENOUS
  Filled 2015-06-20: qty 2

## 2015-06-20 MED ORDER — ACETAMINOPHEN 160 MG/5ML PO SOLN
650.0000 mg | Freq: Once | ORAL | Status: AC
  Administered 2015-06-20: 650 mg via ORAL
  Filled 2015-06-20: qty 20.3

## 2015-06-20 MED ORDER — LIDOCAINE VISCOUS 2 % MT SOLN
15.0000 mL | Freq: Once | OROMUCOSAL | Status: DC
  Filled 2015-06-20: qty 15

## 2015-06-20 NOTE — ED Notes (Signed)
Pt reports nausea and vomiting since yesterday.  Pt reports hx of esophageal stricture and hiatal hernia repair which sometimes would cause his GERD to flare up.  But he reports this time it did not let up.  Pt is not able to keep anything to down.  He was sent here from the walk-in clinic to r/o dehydration.  Pt also reports burning sensation in her chest.

## 2015-06-20 NOTE — Discharge Instructions (Signed)
Esophageal Stricture °Esophageal stricture is a condition that causes the esophagus to become narrow. The esophagus is the long tube in your throat that carries food and liquid from your mouth to your stomach. Esophageal stricture can make it difficult, painful, or even impossible to swallow. The condition also makes choking more likely.  °CAUSES  °Gastroesophageal reflux disease (GERD) is the most common cause of esophageal stricture. In GERD, stomach acid backs up into the esophagus. Over time, this causes scar tissue and leads to narrowing (stricture). °Other causes of esophageal stricture include: °· Scarring from ingesting a harmful substance. °· Damage from medical instruments used in the esophagus. °· Radiation therapy. °· Cancer. °RISK FACTORS °You are at greater risk for esophageal stricture if you have GERD or esophageal cancer. °SIGNS AND SYMPTOMS  °Signs and symptoms of esophageal stricture include: °· Difficulty swallowing. °· Pain when swallowing. °· Heartburn. °· Vomiting or spitting up (regurgitating) food or liquids. °· Weight loss.   °DIAGNOSIS  °Your health care provider may suspect esophageal stricture based on your symptoms. A physical exam will also be done. You may need tests to confirm the diagnosis. These can include: °· Upper endoscopy. Your health care provider will insert a flexible tube with a tiny camera on it (endoscope) into your esophagus to check for a stricture. A tissue sample may also be taken to be examined under a microscope (biopsy). °· Esophageal pH monitoring. This test involves collecting acid in the esophagus with a tube to determine how much stomach acid is entering the esophagus. °· Barium swallow test. For this test, you will drink a barium solution that coats the lining of the esophagus. Then you will have an X-ray taken. The barium solution helps to show if there is stricture. °TREATMENT °Treatment for esophageal stricture depends on what is causing your condition and  how severe it is. Treatment options include: °· Esophageal dilatation. In this procedure, a health care provider inserts an endoscope or a tool called a dilator into your esophagus to gently stretch it and make the opening wider. °· Stents. In some cases, your health care provider may place a small device (stent) in the esophagus to keep it open. °· Acid-blocking medicines. Taking these helps manage GERD symptoms after an esophageal stricture. This can prevent the stricture from returning. °HOME CARE INSTRUCTIONS °· Do not drink alcohol. °· Do not use any tobacco products, including cigarettes, chewing tobacco, or electronic cigarettes. If you need help quitting, ask your health care provider. °· Lose weight if you are overweight. °· Wear loose, comfortable clothing. °· Do not eat for 3 hours before bedtime. °· Elevate your head in bed with pillows. °· Do not overeat at meals. °· Do not eat foods that make reflux worse. These include: °¨ Fatty foods. °¨ Spicy foods. °¨ Soda. °¨ Tomato products. °¨ Chocolate. °SEEK MEDICAL CARE IF: °· You have problems eating or swallowing. °· You regurgitate food and liquid. °MAKE SURE YOU: °· Understand these instructions. °· Will watch your condition. °· Will get help right away if you are not doing well or get worse. °  °This information is not intended to replace advice given to you by your health care provider. Make sure you discuss any questions you have with your health care provider. °  °Document Released: 12/12/2005 Document Revised: 04/24/2014 Document Reviewed: 08/13/2013 °Elsevier Interactive Patient Education ©2016 [employer] ° °

## 2015-06-20 NOTE — ED Provider Notes (Signed)
CSN: [id number]     Arrival date & time 06/20/15  1514 History   First MD Initiated Contact with Patient 06/20/15 1714     Chief Complaint  Patient presents with  . Emesis     (Consider location/radiation/quality/duration/timing/severity/associated sxs/prior Treatment) Patient is a 75 y.o. male presenting with vomiting.  Emesis Severity:  Moderate Duration:  2 days Timing:  Constant Quality:  Undigested food Progression:  Unchanged Chronicity:  Recurrent Recent urination:  Normal Relieved by:  Nothing Worsened by:  Nothing tried Ineffective treatments:  None tried Associated symptoms: no abdominal pain, no arthralgias, no chills, no diarrhea, no headaches and no myalgias    75 yo M With a chief complaint of vomiting. Patient has a history of esophageal stricture and had to be stretched when he had similar symptoms. He's reassured that's what it is. Is not able to tolerate liquids or solids since yesterday. Denies any fevers or chills denies chest pain denies diaphoresis. Denies abdominal pain.  Past Medical History  Diagnosis Date  . Bursitis     chronic hip pain  . BPH (benign prostatic hyperplasia)   . Blood transfusion     " no reaction from transfusion"  . GERD (gastroesophageal reflux disease)   . Spondylisthesis   . Osteoporosis   . Bruises easily   . Diverticulitis   . Rash     GROIN - ITCHING PAST 3 MONTHS  . History of kidney stones   . History of GI bleed     "vessels burst in colon" x2  . Rheumatoid arthritis(714.0)     27 years  . Osteoarthritis   . Disc disease, degenerative, lumbar or lumbosacral     Chronic back pain now  . History of vertigo   . Pneumonia     yrs ago  . Esophageal ring     requiring dilations    Past Surgical History  Procedure Laterality Date  . Total knee arthroplasty  2003    Bilateral  . Nissen fundoplication      about [id number], pt not sure records not currently available  . Lithotripsy for nephrolithiasis      Pt and wife  not sure when but here in [place]  . Lysis of adhesion, small bowel resection  2009  . Hernia repair    . Inguinal hernia repair  2007    Bilateral   . Ventral hernia repair with mesh    . Esophagogastroduodenoscopy N/A 10/01/2012    Procedure: ESOPHAGOGASTRODUODENOSCOPY (EGD);  Surgeon: Garlan Fair, MD;  Location: [facility];  Service: Endoscopy;  Laterality: N/A;  . Balloon dilation N/A 10/01/2012    Procedure: BALLOON DILATION;  Surgeon: Garlan Fair, MD;  Location: [facility];  Service: Endoscopy;  Laterality: N/A;  . Cystoscopy with ureteroscopy Right 12/05/2012    Procedure: CYSTOSCOPY WITH RIGHT URETEROSCOPY AND STONE EXTRACTION;  Surgeon: Malka So, MD;  Location: [facility];  Service: Urology;  Laterality: Right;  . Transurethral prostatectomy with gyrus instruments N/A 12/05/2012    Procedure: TRANSURETHRAL PROSTATECTOMY WITH GYRUS INSTRUMENTS;  Surgeon: Malka So, MD;  Location: [facility];  Service: Urology;  Laterality: N/A;  . Colectomy N/A 06/10/2013    Procedure: TOTAL abdominal COLECTOMY;  Surgeon: Odis Hollingshead, MD;  Location: [facility];  Service: General;  Laterality: N/A;   No family history on file. Social History  Substance Use Topics  . Smoking status: Never Smoker   . Smokeless tobacco: Never Used  . Alcohol Use: No  Review of Systems  Constitutional: Negative for fever and chills.  HENT: Negative for congestion and facial swelling.   Eyes: Negative for discharge and visual disturbance.  Respiratory: Negative for shortness of breath.   Cardiovascular: Negative for chest pain and palpitations.  Gastrointestinal: Positive for nausea and vomiting. Negative for abdominal pain and diarrhea.  Musculoskeletal: Negative for myalgias and arthralgias.  Skin: Negative for color change and rash.  Neurological: Negative for tremors, syncope and headaches.  Psychiatric/Behavioral: Negative for confusion and dysphoric mood.      Allergies   Clindamycin/lincomycin; Enbrel; Penicillins; and Codeine  Home Medications   Prior to Admission medications   Medication Sig Start Date End Date Taking? Authorizing Provider  acetaminophen (TYLENOL) 650 MG CR tablet Take 1,300 mg by mouth 2 (two) times daily.   Yes Historical Provider, MD  Iron TABS Take 1 tablet by mouth daily.   Yes Historical Provider, MD  leflunomide (ARAVA) 20 MG tablet Take 20 mg by mouth daily.  07/16/13  Yes Historical Provider, MD  Multiple Vitamins-Minerals (ICAPS) CAPS Take 1 capsule by mouth daily. Reported on 06/20/2015   Yes Historical Provider, MD  omeprazole (PRILOSEC) 20 MG capsule Take 20 mg by mouth daily.     Yes Historical Provider, MD  predniSONE (DELTASONE) 5 MG tablet Take 5 mg by mouth daily.   Yes Historical Provider, MD  tetrahydrozoline-zinc (VISINE-AC) 0.05-0.25 % ophthalmic solution Place 2 drops into both eyes 3 (three) times daily as needed (for allergies).   Yes Historical Provider, MD  traMADol (ULTRAM) 50 MG tablet Take 50 mg by mouth 2 (two) times daily.   Yes Historical Provider, MD   BP 170/78 mmHg  Pulse 66  Temp(Src) 98.4 F (36.9 C) (Oral)  Resp 19  SpO2 97% Physical Exam  Constitutional: He is oriented to person, place, and time. He appears well-developed and well-nourished.  HENT:  Head: Normocephalic and atraumatic.  Eyes: EOM are normal. Pupils are equal, round, and reactive to light.  Neck: Normal range of motion. Neck supple. No JVD present.  Cardiovascular: Normal rate and regular rhythm.  Exam reveals no gallop and no friction rub.   No murmur heard. Pulmonary/Chest: No respiratory distress. He has no wheezes.  Abdominal: He exhibits no distension. There is no tenderness. There is no rebound and no guarding.  Musculoskeletal: Normal range of motion.  Neurological: He is alert and oriented to person, place, and time.  Skin: No rash noted. No pallor.  Psychiatric: He has a normal mood and affect. His behavior is normal.   Nursing note and vitals reviewed.   ED Course  Procedures (including critical care time) Labs Review Labs Reviewed  COMPREHENSIVE METABOLIC PANEL - Abnormal; Notable for the following:    Glucose, Bld 108 (*)    Calcium 8.6 (*)    ALT 12 (*)    All other components within normal limits  URINALYSIS, ROUTINE W REFLEX MICROSCOPIC (NOT AT [facility]) - Abnormal; Notable for the following:    Color, Urine AMBER (*)    Bilirubin Urine MODERATE (*)    Ketones, ur 40 (*)    Protein, ur 30 (*)    All other components within normal limits  URINE MICROSCOPIC-ADD ON - Abnormal; Notable for the following:    Squamous Epithelial / LPF 0-5 (*)    All other components within normal limits  CBC  LIPASE, BLOOD    Imaging Review No results found. I have personally reviewed and evaluated these images and lab results as part  of my medical decision-making.   EKG Interpretation None      MDM   Final diagnoses:  Esophageal stricture    75 yo M with a chief complaint of vomiting. Case was discussed with GI, Dr. Michail Sermon. Patient was able to tolerate a small amount of water. He'll be seen by his gastroenterologist in the morning.  10:59 PM:  I have discussed the diagnosis/risks/treatment options with the patient and family and believe the pt to be eligible for discharge home to follow-up with GI. We also discussed returning to the ED immediately if new or worsening sx occur. We discussed the sx which are most concerning (e.g., inability to drink, chest or abdominal pain) that necessitate immediate return. Medications administered to the patient during their visit and any new prescriptions provided to the patient are listed below.  Medications given during this visit Medications  ondansetron (ZOFRAN) injection 4 mg (4 mg Intravenous Given 06/20/15 1736)  sodium chloride 0.9 % bolus 1,000 mL (0 mLs Intravenous Stopped 06/20/15 1919)  acetaminophen (TYLENOL) solution 650 mg (650 mg Oral Given 06/20/15 1928)     Discharge Medication List as of 06/20/2015  6:52 PM      The patient appears reasonably screen and/or stabilized for discharge and I doubt any other medical condition or other [facility] requiring further screening, evaluation, or treatment in the ED at this time prior to discharge.      Deno Etienne, DO 06/20/15 2300

## 2015-06-30 ENCOUNTER — Other Ambulatory Visit: Payer: Self-pay | Admitting: Gastroenterology

## 2015-06-30 DIAGNOSIS — R1310 Dysphagia, unspecified: Secondary | ICD-10-CM

## 2015-07-05 ENCOUNTER — Ambulatory Visit
Admission: RE | Admit: 2015-07-05 | Discharge: 2015-07-05 | Disposition: A | Discharge status: Home / Self Care | Payer: Medicare Other | Source: Ambulatory Visit | Attending: Gastroenterology | Admitting: Gastroenterology

## 2015-07-05 DIAGNOSIS — R1310 Dysphagia, unspecified: Secondary | ICD-10-CM

## 2015-08-24 ENCOUNTER — Other Ambulatory Visit: Payer: Self-pay | Admitting: Geriatric Medicine

## 2015-08-24 DIAGNOSIS — M48061 Spinal stenosis, lumbar region without neurogenic claudication: Secondary | ICD-10-CM

## 2015-08-26 ENCOUNTER — Ambulatory Visit
Admission: RE | Admit: 2015-08-26 | Discharge: 2015-08-26 | Disposition: A | Discharge status: Home / Self Care | Payer: Medicare Other | Source: Ambulatory Visit | Attending: Geriatric Medicine | Admitting: Geriatric Medicine

## 2015-08-26 DIAGNOSIS — M48061 Spinal stenosis, lumbar region without neurogenic claudication: Secondary | ICD-10-CM

## 2016-03-23 ENCOUNTER — Encounter: Payer: Self-pay | Admitting: *Deleted

## 2016-03-24 ENCOUNTER — Ambulatory Visit (INDEPENDENT_AMBULATORY_CARE_PROVIDER_SITE_OTHER): Payer: Medicare Other | Admitting: Diagnostic Neuroimaging

## 2016-03-24 ENCOUNTER — Encounter: Payer: Self-pay | Admitting: Diagnostic Neuroimaging

## 2016-03-24 VITALS — BP 167/89 | HR 66 | Ht 67.5 in | Wt 182.6 lb

## 2016-03-24 DIAGNOSIS — M5416 Radiculopathy, lumbar region: Secondary | ICD-10-CM | POA: Diagnosis not present

## 2016-03-24 DIAGNOSIS — M48062 Spinal stenosis, lumbar region with neurogenic claudication: Secondary | ICD-10-CM

## 2016-03-24 DIAGNOSIS — G609 Hereditary and idiopathic neuropathy, unspecified: Secondary | ICD-10-CM | POA: Diagnosis not present

## 2016-03-24 NOTE — Patient Instructions (Signed)
Thank you for coming to see [place] at [facility]. I hope we have been able to provide you high quality care today.  You may receive a patient satisfaction survey over the next few weeks. We would appreciate your feedback and comments so that we may continue to improve ourselves and the health of our patients.  - I will check neuropathy labs   ~~~~~~~~~~~~~~~~~~~~~~~~~~~~~~~~~~~~~~~~~~~~~~~~~~~~~~~~~~~~~~~~~  DR. Alden Bensinger'S GUIDE TO HAPPY AND HEALTHY LIVING These are some of my general health and wellness recommendations. Some of them may apply to you better than others. Please use common sense as you try these suggestions and feel free to ask me any questions.   ACTIVITY/FITNESS Mental, social, emotional and physical stimulation are very important for brain and body health. Try learning a new activity (arts, music, language, sports, games).  Keep moving your body to the best of your abilities. You can do this at home, inside or outside, the park, community center, gym or anywhere you like. Consider a physical therapist or personal trainer to get started. Consider the app Sworkit. Fitness trackers such as smart-watches, smart-phones or Fitbits can help as well.   NUTRITION Eat more plants: colorful vegetables, nuts, seeds and berries.  Eat less sugar, salt, preservatives and processed foods.  Avoid toxins such as cigarettes and alcohol.  Drink water when you are thirsty. Warm water with a slice of lemon is an excellent morning drink to start the day.  Consider these websites for more information The Nutrition Source (https://www.henry-hernandez.biz/) Precision Nutrition (WindowBlog.ch)   RELAXATION Consider practicing mindfulness meditation or other relaxation techniques such as deep breathing, prayer, yoga, tai chi, massage. See website mindful.org or the apps Headspace or Calm to help get started.   SLEEP Try to get at  least 7-8+ hours sleep per day. Regular exercise and reduced caffeine will help you sleep better. Practice good sleep hygeine techniques. See website sleep.org for more information.   PLANNING Prepare estate planning, living will, healthcare POA documents. Sometimes this is best planned with the help of an attorney. Theconversationproject.org and agingwithdignity.org are excellent resources.

## 2016-03-24 NOTE — Progress Notes (Signed)
[facility]  PATIENT: [name] DOB: Jun 26, 1940  REFERRING CLINICIAN: H Stoneking HISTORY FROM: patient and wife REASON FOR VISIT: new consult    HISTORICAL  CHIEF COMPLAINT:  Chief Complaint  Patient presents with  . Peripheral neuropathy    rm 7, New Pt, wife- [name], " legs and feet pain, neuropathy x 5 years; getting steroid inj in lower back- 3rd round, for 2 bulging disks"    HISTORY OF PRESENT ILLNESS:   75 year old right-handed male here for valuation of neuropathy. Patient has history of rheumatoid arthritis, osteoarthritis, lumbar spinal stenosis, bilateral knee replacements.  Past 8-10 years patient has had onset of numbness, tingling, burning pain in his calves and ankles and feet. Over the past 5 years he has had low back pain radiating into his legs. Both of these symptoms have worsened in the last 2-3 years. 2014 patient had EMG nerve conduction study demonstrating severe peripheral neuropathy as well as lumbar radiculopathy. Patient has MRI of the lumbar spine showing severe spinal stenosis at L4-5. Patient has been treating this conservatively with epidural steroid injections and physical therapy.  Patient has 20-30 years of rheumatoid arthritis on multiple disease modifying therapies. Currently on arava and prednisone.     REVIEW OF SYSTEMS: Full 14 system review of systems performed and negative with exception of: Joint pain.  ALLERGIES: Allergies  Allergen Reactions  . Clindamycin/Lincomycin Hives, Shortness Of Breath and Swelling  . Enbrel [Etanercept] Swelling    SEVERE LEG SWELLING   . Penicillins Hives and Shortness Of Breath    Has patient had a PCN reaction causing immediate rash, facial/tongue/throat swelling, SOB or lightheadedness with hypotension: Yes Has patient had a PCN reaction causing severe rash involving mucus membranes or skin necrosis: Yes Has patient had a PCN reaction that required hospitalization No Has  patient had a PCN reaction occurring within the last 10 years: No If all of the above answers are "NO", then may proceed with Cephalosporin use.   . Codeine Hives and Swelling    HOME MEDICATIONS: Outpatient Medications Prior to Visit  Medication Sig Dispense Refill  . acetaminophen (TYLENOL) 650 MG CR tablet Take 1,300 mg by mouth 2 (two) times daily.    . Iron TABS Take 1 tablet by mouth daily.    Marland Kitchen leflunomide (ARAVA) 20 MG tablet Take 20 mg by mouth daily.     Marland Kitchen omeprazole (PRILOSEC) 20 MG capsule Take 20 mg by mouth daily.      . predniSONE (DELTASONE) 5 MG tablet Take 5 mg by mouth daily.    Marland Kitchen tetrahydrozoline-zinc (VISINE-AC) 0.05-0.25 % ophthalmic solution Place 2 drops into both eyes 3 (three) times daily as needed (for allergies).    . traMADol (ULTRAM) 50 MG tablet Take 50 mg by mouth 2 (two) times daily.    . Multiple Vitamins-Minerals (ICAPS) CAPS Take 1 capsule by mouth daily. Reported on 06/20/2015     No facility-administered medications prior to visit.     PAST MEDICAL HISTORY: Past Medical History:  Diagnosis Date  . Blood transfusion    " no reaction from transfusion"  . BPH (benign prostatic hyperplasia)   . Bruises easily   . Bursitis    chronic hip pain  . Disc disease, degenerative, lumbar or lumbosacral    Chronic back pain now  . Diverticulitis   . Esophageal ring    requiring dilations   . GERD (gastroesophageal reflux disease)   . History of GI bleed    "vessels  burst in colon" x2  . History of kidney stones   . History of vertigo   . Osteoarthritis   . Osteoporosis   . Pneumonia    yrs ago  . Rash    GROIN - ITCHING PAST 3 MONTHS  . Rheumatoid arthritis(714.0)    27 years  . Spondylisthesis   . Ureteral stone 11/2012    PAST SURGICAL HISTORY: Past Surgical History:  Procedure Laterality Date  . BALLOON DILATION N/A 10/01/2012   Procedure: BALLOON DILATION;  Surgeon: Garlan Fair, MD;  Location: [facility];  Service: Endoscopy;   Laterality: N/A;  . COLECTOMY N/A 06/10/2013   Procedure: TOTAL abdominal COLECTOMY;  Surgeon: Odis Hollingshead, MD;  Location: [facility];  Service: General;  Laterality: N/A;  . CYSTOSCOPY WITH URETEROSCOPY Right 12/05/2012   Procedure: CYSTOSCOPY WITH RIGHT URETEROSCOPY AND STONE EXTRACTION;  Surgeon: Malka So, MD;  Location: [facility];  Service: Urology;  Laterality: Right;  . ESOPHAGOGASTRODUODENOSCOPY N/A 10/01/2012   Procedure: ESOPHAGOGASTRODUODENOSCOPY (EGD);  Surgeon: Garlan Fair, MD;  Location: [facility];  Service: Endoscopy;  Laterality: N/A;  . HERNIA REPAIR    . INGUINAL HERNIA REPAIR  2007   Bilateral   . Lithotripsy for nephrolithiasis     Pt and wife not sure when but here in [place]  . Lysis of Adhesion, small bowel resection  2009  . NISSEN FUNDOPLICATION     about [id number], pt not sure records not currently available  . TOTAL KNEE ARTHROPLASTY  2003   Bilateral  . TRANSURETHRAL PROSTATECTOMY WITH GYRUS INSTRUMENTS N/A 12/05/2012   Procedure: TRANSURETHRAL PROSTATECTOMY WITH GYRUS INSTRUMENTS;  Surgeon: Malka So, MD;  Location: [facility];  Service: Urology;  Laterality: N/A;  . Ventral Hernia Repair with Mesh      FAMILY HISTORY: Family History  Problem Relation Age of Onset  . Adopted: Yes  . Heart failure Mother   . Heart failure Father     SOCIAL HISTORY:  Social History   Social History  . Marital status: Married    Spouse name: [name]  . Number of children: 2  . Years of education: 12   Occupational History  .      Chief Financial Officer, retired   Social History Main Topics  . Smoking status: Never Smoker  . Smokeless tobacco: Never Used  . Alcohol use No  . Drug use: No  . Sexual activity: Not Currently   Other Topics Concern  . Not on file   Social History Narrative   Lives at home with wife   Caffeine - Cokes, 1-2 weekly; coffee, 2-3 cups daily in winter     PHYSICAL EXAM   GENERAL EXAM/CONSTITUTIONAL: Vitals:  Vitals:    03/24/16 1043  BP: (!) 167/89  Pulse: 66  Weight: 182 lb 9.6 oz (82.8 kg)  Height: 5' 7.5" (1.715 m)   Body mass index is 28.18 kg/m.  Visual Acuity Screening   Right eye Left eye Both eyes  Without correction:     With correction: 20/30 20/30     Patient is in no distress; well developed, nourished and groomed; neck is supple  CARDIOVASCULAR:  Examination of carotid arteries is normal; no carotid bruits  Regular rate and rhythm, no murmurs  Examination of peripheral vascular system by observation and palpation is normal  EYES:  Ophthalmoscopic exam of optic discs and posterior segments is normal; no papilledema or hemorrhages  MUSCULOSKELETAL:  Gait, strength, tone, movements noted in Neurologic exam below  NEUROLOGIC: MENTAL STATUS:  No flowsheet data found.  awake, alert, oriented to person, place and time  recent and remote memory intact  normal attention and concentration  language fluent, comprehension intact, naming intact,   fund of knowledge appropriate  CRANIAL NERVE:   2nd - no papilledema on fundoscopic exam  2nd, 3rd, 4th, 6th - pupils equal and reactive to light, visual fields full to confrontation, extraocular muscles intact, no nystagmus  5th - facial sensation symmetric  7th - facial strength symmetric  8th - hearing intact  9th - palate elevates symmetrically, uvula midline  11th - shoulder shrug symmetric  12th - tongue protrusion midline  MOTOR:   normal bulk and tone, full strength in the BUE, BLE  SENSORY:   normal and symmetric to light touch  ABSENT PP, VIB AT TOES AND ANKLES  DECR PROPRIO AT TOES  COORDINATION:   finger-nose-finger, fine finger movements normal  REFLEXES:   deep tendon reflexes --> ABSENT THROUGHOUT  GAIT/STATION:   narrow based gait; SLOW CAUTIOUS, ANTALGIC GAIT    DIAGNOSTIC DATA (LABS, IMAGING, TESTING) - I reviewed patient records, labs, notes, testing and imaging myself where  available.  Lab Results  Component Value Date   WBC 10.0 06/20/2015   HGB 15.9 06/20/2015   HCT 48.2 06/20/2015   MCV 88.0 06/20/2015   PLT 225 06/20/2015      Component Value Date/Time   NA 139 06/20/2015 1632   K 4.4 06/20/2015 1632   CL 103 06/20/2015 1632   CO2 24 06/20/2015 1632   GLUCOSE 108 (H) 06/20/2015 1632   BUN 17 06/20/2015 1632   CREATININE 1.00 06/20/2015 1632   CALCIUM 8.6 (L) 06/20/2015 1632   PROT 7.8 06/20/2015 1632   ALBUMIN 3.8 06/20/2015 1632   AST 20 06/20/2015 1632   ALT 12 (L) 06/20/2015 1632   ALKPHOS 71 06/20/2015 1632   BILITOT 0.9 06/20/2015 1632   GFRNONAA >60 06/20/2015 1632   GFRAA >60 06/20/2015 1632   No results found for: CHOL, HDL, LDLCALC, LDLDIRECT, TRIG, CHOLHDL No results found for: HGBA1C Lab Results  Component Value Date   VITAMINB12 383 04/15/2011   Lab Results  Component Value Date   TSH 0.778 04/14/2011    08/16/12 EMG/NCS (Dr. Brien Few) - Significant peripheral sensorimotor polyneuropathy - Mild lumbar radiculitis  08/26/15 MRI LUMBAR [I reviewed images myself and agree with interpretation. -VRP]  1. Chronic grade 1 L4-L5 spondylolisthesis with progression since 2012. Progressed and severe chronic disc degeneration. Acute and chronic endplate degeneration. Moderate to severe facet and posterior element degeneration. Subsequent L4-L5 multifactorial severe spinal, lateral recess, and right foraminal stenosis. 2. Chronic but less pronounced L5-S1 disc, endplate, and facet degeneration resulting in mild lateral recess and left foraminal stenosis. 3. Minimal to mild lumbar spine degeneration elsewhere.     ASSESSMENT AND PLAN  75 y.o. year old male here with bilateral lower extremity numbness, pain, sensitivity, with low back pain radiating to the bilateral lower extremities. Patient has signs and symptoms of lumbar spinal stenosis with neurogenic claudication. He also may have superimposed polyneuropathy, which may be due to  underlying rheumatoid arthritis or other cause. We'll check additional testing to rule out secondary causes of neuropathy. Continue current treatment of lumbar spine disease as per Dr. Brien Few and Dr. Saintclair Halsted.   Ddx: lumbar spinal stenosis + peripheral neuropathy (hereditary vs acquired)  1. Spinal stenosis, lumbar region, with neurogenic claudication   2. Lumbar radiculopathy   3. Hereditary and idiopathic peripheral neuropathy  PLAN: - continue treatment of lumbar spine disease (epidural steroid injections, PT, cane; consider surgical options) - check neuropathy labs  Orders Placed This Encounter  Procedures  . Neuropathy Panel   Return in about 3 months (around 06/22/2016).    Penni Bombard, MD [id number], [id number] PM Certified in Neurology, Neurophysiology and [facility] [address], [place], [place] [address] [phone]

## 2016-03-28 ENCOUNTER — Encounter: Payer: Self-pay | Admitting: Diagnostic Neuroimaging

## 2016-03-28 LAB — NEUROPATHY PANEL
A/G Ratio: 0.9 (ref 0.7–1.7)
ALBUMIN ELP: 3.5 g/dL (ref 2.9–4.4)
ALPHA 1: 0.3 g/dL (ref 0.0–0.4)
ANGIO CONVERT ENZYME: 37 U/L (ref 14–82)
Alpha 2: 0.9 g/dL (ref 0.4–1.0)
Anti Nuclear Antibody(ANA): NEGATIVE
Beta: 2.2 g/dL — ABNORMAL HIGH (ref 0.7–1.3)
Gamma Globulin: 0.5 g/dL (ref 0.4–1.8)
Globulin, Total: 3.9 g/dL (ref 2.2–3.9)
M-SPIKE, %: 1.2 g/dL — AB
RHEUMATOID FACTOR: 11.5 [IU]/mL (ref 0.0–13.9)
SED RATE: 9 mm/h (ref 0–30)
TSH: 0.764 u[IU]/mL (ref 0.450–4.500)
Total Protein: 7.4 g/dL (ref 6.0–8.5)
VITAMIN B 12: 455 pg/mL (ref 232–1245)
Vit D, 25-Hydroxy: 29.3 ng/mL — ABNORMAL LOW (ref 30.0–100.0)

## 2016-04-03 ENCOUNTER — Telehealth: Payer: Self-pay | Admitting: Diagnostic Neuroimaging

## 2016-04-03 NOTE — Telephone Encounter (Signed)
I called patient. No answer. 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 Last eval was in 2012. -VRP

## 2016-04-04 NOTE — Telephone Encounter (Signed)
Per Dr Leta Baptist, spoke with patient and informed him that his neuropathy lab results were abnormal. 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 He saw Dr Alen Blew Jan 2012. Patient stated he wrote down information; gave him cancer center phone number to call. He verbalized understanding, appreciation of call.

## 2016-04-04 NOTE — Telephone Encounter (Signed)
LVM with number, requested call back for lab results.

## 2016-04-04 NOTE — Telephone Encounter (Signed)
Patient is returning your call.  

## 2016-04-05 ENCOUNTER — Encounter: Payer: Self-pay | Admitting: *Deleted

## 2016-04-05 ENCOUNTER — Telehealth: Payer: Self-pay | Admitting: Oncology

## 2016-04-05 NOTE — Telephone Encounter (Signed)
Confirmed Jan 2018 appt date/time per LOS

## 2016-04-06 NOTE — Telephone Encounter (Signed)
Pt request lab work to be sent to him with an explanation to home address. Address was confirmed that it is correct

## 2016-04-06 NOTE — Telephone Encounter (Signed)
I spoke with patient about his lab results. He has appt with heme/onc at end of Jan 2018. -VRP

## 2016-04-20 ENCOUNTER — Other Ambulatory Visit: Payer: Self-pay | Admitting: Neurosurgery

## 2016-04-20 DIAGNOSIS — M4316 Spondylolisthesis, lumbar region: Secondary | ICD-10-CM | POA: Diagnosis not present

## 2016-04-20 DIAGNOSIS — M48062 Spinal stenosis, lumbar region with neurogenic claudication: Secondary | ICD-10-CM | POA: Diagnosis not present

## 2016-04-24 ENCOUNTER — Telehealth: Payer: Self-pay | Admitting: Oncology

## 2016-04-24 NOTE — Telephone Encounter (Signed)
number and left a message to advise that appointment has been changed from 05/17/16 to 05/30/16 at 1:45 pm

## 2016-05-01 NOTE — Pre-Procedure Instructions (Signed)
[name]  05/01/2016      [facility] - [place] - [address] [place] [address] Phone: [phone] Fax: [phone]    Your procedure is scheduled on January 22  Report to [place] at 0730 A.M.  Call this number if you have problems the morning of surgery:  [phone]   Remember:  Do not eat food or drink liquids after midnight.   Take these medicines the morning of surgery with A SIP OF WATER acetaminophen (TYLENOL) if needed, leflunomide (ARAVA), omeprazole (PRILOSEC),  predniSONE (DELTASONE), eye drops if needed, Tramadol if needed  7 days prior to surgery STOP taking any Aspirin, Aleve, Naproxen, Ibuprofen, Motrin, Advil, Goody's, BC's, all herbal medications, fish oil, and all vitamins    Do not wear jewelry.  Do not wear lotions, powders, or cologne, or deoderant.  Men may shave face and neck.  Do not bring valuables to the hospital.  [facility] is not responsible for any belongings or valuables.  Contacts, dentures or bridgework may not be worn into surgery.  Leave your suitcase in the car.  After surgery it may be brought to your room.  For patients admitted to the hospital, discharge time will be determined by your treatment team.  Patients discharged the day of surgery will not be allowed to drive home.    Special instructions:   [facility]- Preparing For Surgery  Before surgery, you can play an important role. Because skin is not sterile, your skin needs to be as free of germs as possible. You can reduce the number of germs on your skin by washing with CHG (chlorahexidine gluconate) Soap before surgery.  CHG is an antiseptic cleaner which kills germs and bonds with the skin to continue killing germs even after washing.  Please do not use if you have an allergy to CHG or antibacterial soaps. If your skin becomes reddened/irritated stop using the CHG.  Do  not shave (including legs and underarms) for at least 48 hours prior to first CHG shower. It is OK to shave your face.  Please follow these instructions carefully.   1. Shower the NIGHT BEFORE SURGERY and the MORNING OF SURGERY with CHG.   2. If you chose to wash your hair, wash your hair first as usual with your normal shampoo.  3. After you shampoo, rinse your hair and body thoroughly to remove the shampoo.  4. Use CHG as you would any other liquid soap. You can apply CHG directly to the skin and wash gently with a scrungie or a clean washcloth.   5. Apply the CHG Soap to your body ONLY FROM THE NECK DOWN.  Do not use on open wounds or open sores. Avoid contact with your eyes, ears, mouth and genitals (private parts). Wash genitals (private parts) with your normal soap.  6. Wash thoroughly, paying special attention to the area where your surgery will be performed.  7. Thoroughly rinse your body with warm water from the neck down.  8. DO NOT shower/wash with your normal soap after using and rinsing off the CHG Soap.  9. Pat yourself dry with a CLEAN TOWEL.   10. Wear CLEAN PAJAMAS   11. Place CLEAN SHEETS on your bed the night of your first shower and DO NOT SLEEP WITH PETS.    Day of Surgery: Do not apply any deodorants/lotions. Please wear clean clothes to the hospital/surgery center.  Please read over the following fact sheets that you were given.

## 2016-05-02 ENCOUNTER — Encounter (HOSPITAL_COMMUNITY)
Admission: RE | Admit: 2016-05-02 | Discharge: 2016-05-02 | Disposition: A | Discharge status: Home / Self Care | Payer: PPO | Source: Ambulatory Visit | Attending: Neurosurgery | Admitting: Neurosurgery

## 2016-05-02 ENCOUNTER — Encounter (HOSPITAL_COMMUNITY): Payer: Self-pay

## 2016-05-02 DIAGNOSIS — Z01812 Encounter for preprocedural laboratory examination: Secondary | ICD-10-CM | POA: Diagnosis not present

## 2016-05-02 HISTORY — DX: Malignant (primary) neoplasm, unspecified: C80.1

## 2016-05-02 LAB — BASIC METABOLIC PANEL
ANION GAP: 7 (ref 5–15)
BUN: 12 mg/dL (ref 6–20)
CALCIUM: 9.5 mg/dL (ref 8.9–10.3)
CO2: 27 mmol/L (ref 22–32)
Chloride: 106 mmol/L (ref 101–111)
Creatinine, Ser: 1.18 mg/dL (ref 0.61–1.24)
GFR, EST NON AFRICAN AMERICAN: 59 mL/min — AB (ref >60)
Glucose, Bld: 101 mg/dL — ABNORMAL HIGH (ref 65–99)
POTASSIUM: 4.4 mmol/L (ref 3.5–5.1)
Sodium: 140 mmol/L (ref 135–145)

## 2016-05-02 LAB — CBC
HEMATOCRIT: 44.9 % (ref 39.0–52.0)
HEMOGLOBIN: 14.3 g/dL (ref 13.0–17.0)
MCH: 27.7 pg (ref 26.0–34.0)
MCHC: 31.8 g/dL (ref 30.0–36.0)
MCV: 87 fL (ref 78.0–100.0)
Platelets: 262 10*3/uL (ref 150–400)
RBC: 5.16 MIL/uL (ref 4.22–5.81)
RDW: 14.8 % (ref 11.5–15.5)
WBC: 8.9 10*3/uL (ref 4.0–10.5)

## 2016-05-02 LAB — TYPE AND SCREEN
ABO/RH(D): A NEG
ANTIBODY SCREEN: NEGATIVE

## 2016-05-02 LAB — SURGICAL PCR SCREEN
MRSA, PCR: NEGATIVE
STAPHYLOCOCCUS AUREUS: NEGATIVE

## 2016-05-02 NOTE — Progress Notes (Signed)
PCP - Hal Stoneking Cardiologist - denies  Chest x-ray - not needed EKG - 05/14/15 abnormal Stress Test - denies ECHO - > 10 years Cardiac Cath - denues Sleep Study - denies   Fasting Blood Sugar - n/a  Checks Blood Sugar ___n/a__ times a day  Will send to anesthesia for review of ekg, Patient at PAT appointment had a cough but was not with a fever.  Patient stated that he was having a little bit of nasal drainage  That would cause him to cough    Patient denies shortness of breath, fever, and chest pain at PAT appointment   Patient verbalized understanding of instructions that was given to them at the PAT appointment. Patient expressed that there were no further questions.  Patient was also instructed that they will need to review over the PAT instructions again at home before the surgery.

## 2016-05-04 NOTE — Progress Notes (Signed)
Anesthesia Chart Review:  Pt is a 76 year old male scheduled for L4-5 PLIF on 05/08/2016 with Kary Kos, MD.   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 Never smoker. BMI 28  Medications include: Arava, prilosec, prednisone  Preoperative labs reviewed.    CXR 05/14/15: No active cardiopulmonary disease.  EKG 05/14/15: NSR. Moderate voltage criteria for LVH, may be normal variant. Cannot rule out Septal infarct, age undetermined. New T wave inversion III, otherwise no significant change from previous  If no changes, I anticipate pt can proceed with surgery as scheduled.   [name] Cass, FNP-BC [facility]/Anesthesiology Phone: [phone] 05/04/2016 3:30 PM

## 2016-05-08 ENCOUNTER — Inpatient Hospital Stay (HOSPITAL_COMMUNITY): Payer: PPO | Admitting: Vascular Surgery

## 2016-05-08 ENCOUNTER — Encounter (HOSPITAL_COMMUNITY)
Admission: RE | Disposition: A | Discharge status: Home / Self Care | Payer: Self-pay | Source: Ambulatory Visit | Attending: Neurosurgery

## 2016-05-08 ENCOUNTER — Inpatient Hospital Stay (HOSPITAL_COMMUNITY)
Admission: RE | Admit: 2016-05-08 | Discharge: 2016-05-09 | DRG: 455 | Disposition: A | Discharge status: Home / Self Care | Payer: PPO | Source: Ambulatory Visit | Attending: Neurosurgery | Admitting: Neurosurgery

## 2016-05-08 ENCOUNTER — Inpatient Hospital Stay (HOSPITAL_COMMUNITY): Payer: PPO | Admitting: Certified Registered Nurse Anesthetist

## 2016-05-08 ENCOUNTER — Inpatient Hospital Stay (HOSPITAL_COMMUNITY): Payer: PPO

## 2016-05-08 ENCOUNTER — Encounter (HOSPITAL_COMMUNITY): Payer: Self-pay | Admitting: *Deleted

## 2016-05-08 DIAGNOSIS — Z79899 Other long term (current) drug therapy: Secondary | ICD-10-CM

## 2016-05-08 DIAGNOSIS — Z7952 Long term (current) use of systemic steroids: Secondary | ICD-10-CM | POA: Diagnosis not present

## 2016-05-08 DIAGNOSIS — D500 Iron deficiency anemia secondary to blood loss (chronic): Secondary | ICD-10-CM | POA: Diagnosis not present

## 2016-05-08 DIAGNOSIS — Z8546 Personal history of malignant neoplasm of prostate: Secondary | ICD-10-CM | POA: Diagnosis not present

## 2016-05-08 DIAGNOSIS — M549 Dorsalgia, unspecified: Secondary | ICD-10-CM | POA: Diagnosis not present

## 2016-05-08 DIAGNOSIS — Z96653 Presence of artificial knee joint, bilateral: Secondary | ICD-10-CM | POA: Diagnosis not present

## 2016-05-08 DIAGNOSIS — M069 Rheumatoid arthritis, unspecified: Secondary | ICD-10-CM | POA: Diagnosis present

## 2016-05-08 DIAGNOSIS — K219 Gastro-esophageal reflux disease without esophagitis: Secondary | ICD-10-CM | POA: Diagnosis present

## 2016-05-08 DIAGNOSIS — Z419 Encounter for procedure for purposes other than remedying health state, unspecified: Secondary | ICD-10-CM

## 2016-05-08 DIAGNOSIS — M48062 Spinal stenosis, lumbar region with neurogenic claudication: Secondary | ICD-10-CM | POA: Diagnosis not present

## 2016-05-08 DIAGNOSIS — M48061 Spinal stenosis, lumbar region without neurogenic claudication: Secondary | ICD-10-CM | POA: Diagnosis not present

## 2016-05-08 DIAGNOSIS — M4316 Spondylolisthesis, lumbar region: Secondary | ICD-10-CM | POA: Diagnosis not present

## 2016-05-08 DIAGNOSIS — M4326 Fusion of spine, lumbar region: Secondary | ICD-10-CM | POA: Diagnosis not present

## 2016-05-08 HISTORY — PX: MAXIMUM ACCESS (MAS)POSTERIOR LUMBAR INTERBODY FUSION (PLIF) 1 LEVEL: SHX6368

## 2016-05-08 SURGERY — FOR MAXIMUM ACCESS (MAS) POSTERIOR LUMBAR INTERBODY FUSION (PLIF) 1 LEVEL
Anesthesia: General | Site: Spine Lumbar

## 2016-05-08 MED ORDER — THROMBIN 20000 UNITS EX SOLR
CUTANEOUS | Status: DC | PRN
  Administered 2016-05-08 (×2): via TOPICAL

## 2016-05-08 MED ORDER — SODIUM CHLORIDE 0.9% FLUSH: 3.0000 mL | INTRAVENOUS | Status: DC | PRN

## 2016-05-08 MED ORDER — PROPOFOL 10 MG/ML IV BOLUS
INTRAVENOUS | Status: DC | PRN
  Administered 2016-05-08: 140 mg via INTRAVENOUS
  Administered 2016-05-08: 40 mg via INTRAVENOUS
  Administered 2016-05-08: 20 mg via INTRAVENOUS

## 2016-05-08 MED ORDER — ALUM & MAG HYDROXIDE-SIMETH 200-200-20 MG/5ML PO SUSP: 30.0000 mL | Freq: Four times a day (QID) | ORAL | Status: DC | PRN

## 2016-05-08 MED ORDER — PROPOFOL 500 MG/50ML IV EMUL
INTRAVENOUS | Status: DC | PRN
  Administered 2016-05-08: 50 ug/kg/min via INTRAVENOUS

## 2016-05-08 MED ORDER — MIDAZOLAM HCL 2 MG/2ML IJ SOLN
INTRAMUSCULAR | Status: DC | PRN
  Administered 2016-05-08: .5 mg via INTRAVENOUS

## 2016-05-08 MED ORDER — ACETAMINOPHEN 325 MG PO TABS: 650.0000 mg | ORAL_TABLET | Freq: Every day | ORAL | Status: DC

## 2016-05-08 MED ORDER — MIDAZOLAM HCL 2 MG/2ML IJ SOLN
INTRAMUSCULAR | Status: AC
  Filled 2016-05-08: qty 2

## 2016-05-08 MED ORDER — LIDOCAINE-EPINEPHRINE (PF) 2 %-1:200000 IJ SOLN
INTRAMUSCULAR | Status: DC | PRN
  Administered 2016-05-08: 8 mL

## 2016-05-08 MED ORDER — NAPHAZOLINE-GLYCERIN 0.012-0.2 % OP SOLN: 2.0000 [drp] | Freq: Four times a day (QID) | OPHTHALMIC | Status: DC | PRN

## 2016-05-08 MED ORDER — SUCCINYLCHOLINE CHLORIDE 200 MG/10ML IV SOSY
PREFILLED_SYRINGE | INTRAVENOUS | Status: AC
  Filled 2016-05-08: qty 10

## 2016-05-08 MED ORDER — ACETAMINOPHEN 325 MG PO TABS
650.0000 mg | ORAL_TABLET | Freq: Every day | ORAL | Status: DC
  Administered 2016-05-08: 650 mg via ORAL
  Filled 2016-05-08: qty 2

## 2016-05-08 MED ORDER — EPHEDRINE 5 MG/ML INJ
INTRAVENOUS | Status: AC
  Filled 2016-05-08: qty 10

## 2016-05-08 MED ORDER — PHENOL 1.4 % MT LIQD: 1.0000 | OROMUCOSAL | Status: DC | PRN

## 2016-05-08 MED ORDER — PANTOPRAZOLE SODIUM 40 MG PO TBEC: 40.0000 mg | DELAYED_RELEASE_TABLET | Freq: Every evening | ORAL | Status: DC

## 2016-05-08 MED ORDER — PROPOFOL 10 MG/ML IV BOLUS
INTRAVENOUS | Status: AC
  Filled 2016-05-08: qty 20

## 2016-05-08 MED ORDER — SUGAMMADEX SODIUM 200 MG/2ML IV SOLN
INTRAVENOUS | Status: AC
  Filled 2016-05-08: qty 2

## 2016-05-08 MED ORDER — LACTATED RINGERS IV SOLN
INTRAVENOUS | Status: DC
  Administered 2016-05-08 (×3): via INTRAVENOUS

## 2016-05-08 MED ORDER — DEXAMETHASONE SODIUM PHOSPHATE 10 MG/ML IJ SOLN
INTRAMUSCULAR | Status: DC | PRN
  Administered 2016-05-08: 10 mg via INTRAVENOUS

## 2016-05-08 MED ORDER — MENTHOL 3 MG MT LOZG: 1.0000 | LOZENGE | OROMUCOSAL | Status: DC | PRN

## 2016-05-08 MED ORDER — ACETAMINOPHEN 500 MG PO TABS: 1000.0000 mg | ORAL_TABLET | Freq: Every morning | ORAL | Status: DC

## 2016-05-08 MED ORDER — ONDANSETRON HCL 4 MG/2ML IJ SOLN
INTRAMUSCULAR | Status: DC | PRN
  Administered 2016-05-08: 4 mg via INTRAVENOUS

## 2016-05-08 MED ORDER — DEXAMETHASONE SODIUM PHOSPHATE 10 MG/ML IJ SOLN: 10.0000 mg | INTRAMUSCULAR | Status: DC

## 2016-05-08 MED ORDER — DEXAMETHASONE SODIUM PHOSPHATE 10 MG/ML IJ SOLN
INTRAMUSCULAR | Status: AC
  Filled 2016-05-08: qty 1

## 2016-05-08 MED ORDER — SUCCINYLCHOLINE CHLORIDE 20 MG/ML IJ SOLN
INTRAMUSCULAR | Status: DC | PRN
  Administered 2016-05-08: 120 mg via INTRAVENOUS

## 2016-05-08 MED ORDER — SODIUM CHLORIDE 0.9 % IR SOLN
Status: DC | PRN
  Administered 2016-05-08: 12:00:00

## 2016-05-08 MED ORDER — 0.9 % SODIUM CHLORIDE (POUR BTL) OPTIME
TOPICAL | Status: DC | PRN
  Administered 2016-05-08: 1000 mL

## 2016-05-08 MED ORDER — VANCOMYCIN HCL 10 G IV SOLR
1500.0000 mg | INTRAVENOUS | Status: DC
  Administered 2016-05-08: 1500 mg via INTRAVENOUS
  Filled 2016-05-08 (×2): qty 1500

## 2016-05-08 MED ORDER — ACETAMINOPHEN ER 650 MG PO TBCR: 650.0000 mg | EXTENDED_RELEASE_TABLET | Freq: Two times a day (BID) | ORAL | Status: DC

## 2016-05-08 MED ORDER — LIDOCAINE-EPINEPHRINE (PF) 2 %-1:200000 IJ SOLN
INTRAMUSCULAR | Status: AC
  Filled 2016-05-08: qty 20

## 2016-05-08 MED ORDER — CHLORHEXIDINE GLUCONATE CLOTH 2 % EX PADS
6.0000 | MEDICATED_PAD | Freq: Once | CUTANEOUS | Status: DC
Start: 2016-05-08 — End: 2016-05-08

## 2016-05-08 MED ORDER — PHENYLEPHRINE HCL 10 MG/ML IJ SOLN
INTRAMUSCULAR | Status: DC | PRN
  Administered 2016-05-08: 20 ug/min via INTRAVENOUS

## 2016-05-08 MED ORDER — LEFLUNOMIDE 20 MG PO TABS
20.0000 mg | ORAL_TABLET | Freq: Every day | ORAL | Status: DC
  Administered 2016-05-09: 20 mg via ORAL
  Filled 2016-05-08: qty 1

## 2016-05-08 MED ORDER — THROMBIN 20000 UNITS EX SOLR
CUTANEOUS | Status: AC
  Filled 2016-05-08: qty 20000

## 2016-05-08 MED ORDER — VANCOMYCIN HCL IN DEXTROSE 1-5 GM/200ML-% IV SOLN
INTRAVENOUS | Status: AC
  Filled 2016-05-08: qty 200

## 2016-05-08 MED ORDER — HYDROMORPHONE HCL 1 MG/ML IJ SOLN: 0.5000 mg | INTRAMUSCULAR | Status: DC | PRN

## 2016-05-08 MED ORDER — FENTANYL CITRATE (PF) 100 MCG/2ML IJ SOLN
INTRAMUSCULAR | Status: AC
  Filled 2016-05-08: qty 4

## 2016-05-08 MED ORDER — ONDANSETRON HCL 4 MG/2ML IJ SOLN
INTRAMUSCULAR | Status: AC
  Filled 2016-05-08: qty 2

## 2016-05-08 MED ORDER — MIDAZOLAM HCL 2 MG/2ML IJ SOLN: 0.5000 mg | Freq: Once | INTRAMUSCULAR | Status: DC | PRN

## 2016-05-08 MED ORDER — FENTANYL CITRATE (PF) 100 MCG/2ML IJ SOLN
INTRAMUSCULAR | Status: DC | PRN
  Administered 2016-05-08: 150 ug via INTRAVENOUS
  Administered 2016-05-08: 50 ug via INTRAVENOUS

## 2016-05-08 MED ORDER — PHENYLEPHRINE HCL 10 MG/ML IJ SOLN
INTRAMUSCULAR | Status: DC | PRN
  Administered 2016-05-08: 20 ug via INTRAVENOUS
  Administered 2016-05-08 (×2): 40 ug via INTRAVENOUS
  Administered 2016-05-08: 80 ug via INTRAVENOUS
  Administered 2016-05-08: 20 ug via INTRAVENOUS

## 2016-05-08 MED ORDER — VANCOMYCIN HCL 1000 MG IV SOLR
INTRAVENOUS | Status: DC | PRN
  Administered 2016-05-08: 1000 mg via TOPICAL

## 2016-05-08 MED ORDER — PREDNISONE 5 MG PO TABS
5.0000 mg | ORAL_TABLET | Freq: Every day | ORAL | Status: DC
  Administered 2016-05-09: 5 mg via ORAL
  Filled 2016-05-08: qty 1

## 2016-05-08 MED ORDER — SODIUM CHLORIDE 0.9% FLUSH
3.0000 mL | Freq: Two times a day (BID) | INTRAVENOUS | Status: DC
  Administered 2016-05-08: 3 mL via INTRAVENOUS

## 2016-05-08 MED ORDER — THROMBIN 5000 UNITS EX SOLR
CUTANEOUS | Status: AC
  Filled 2016-05-08: qty 5000

## 2016-05-08 MED ORDER — BUPIVACAINE LIPOSOME 1.3 % IJ SUSP
20.0000 mL | INTRAMUSCULAR | Status: AC
  Administered 2016-05-08: 20 mL
  Filled 2016-05-08: qty 20

## 2016-05-08 MED ORDER — LIDOCAINE HCL (CARDIAC) 20 MG/ML IV SOLN
INTRAVENOUS | Status: DC | PRN
  Administered 2016-05-08: 30 mg via INTRAVENOUS

## 2016-05-08 MED ORDER — PROPOFOL 10 MG/ML IV BOLUS
INTRAVENOUS | Status: AC
  Filled 2016-05-08: qty 40

## 2016-05-08 MED ORDER — OXYCODONE-ACETAMINOPHEN 5-325 MG PO TABS: 1.0000 | ORAL_TABLET | ORAL | Status: DC | PRN

## 2016-05-08 MED ORDER — TRAMADOL HCL 50 MG PO TABS
50.0000 mg | ORAL_TABLET | Freq: Two times a day (BID) | ORAL | Status: DC
  Administered 2016-05-08: 50 mg via ORAL
  Filled 2016-05-08: qty 1

## 2016-05-08 MED ORDER — PROMETHAZINE HCL 25 MG/ML IJ SOLN: 6.2500 mg | INTRAMUSCULAR | Status: DC | PRN

## 2016-05-08 MED ORDER — HYDROMORPHONE HCL 1 MG/ML IJ SOLN
0.2500 mg | INTRAMUSCULAR | Status: DC | PRN
  Administered 2016-05-08 (×2): 0.5 mg via INTRAVENOUS

## 2016-05-08 MED ORDER — ACETAMINOPHEN 650 MG RE SUPP: 650.0000 mg | RECTAL | Status: DC | PRN

## 2016-05-08 MED ORDER — HYDROCODONE-ACETAMINOPHEN 5-325 MG PO TABS
1.0000 | ORAL_TABLET | ORAL | Status: DC | PRN
  Administered 2016-05-08 – 2016-05-09 (×4): 1 via ORAL
  Filled 2016-05-08 (×4): qty 1

## 2016-05-08 MED ORDER — PANTOPRAZOLE SODIUM 40 MG IV SOLR: 40.0000 mg | Freq: Every day | INTRAVENOUS | Status: DC

## 2016-05-08 MED ORDER — ACETAMINOPHEN 500 MG PO TABS: 1000.0000 mg | ORAL_TABLET | Freq: Every day | ORAL | Status: DC

## 2016-05-08 MED ORDER — VANCOMYCIN HCL IN DEXTROSE 1-5 GM/200ML-% IV SOLN
1000.0000 mg | INTRAVENOUS | Status: AC
  Administered 2016-05-08: 1000 mg via INTRAVENOUS

## 2016-05-08 MED ORDER — BUPIVACAINE HCL (PF) 0.25 % IJ SOLN
INTRAMUSCULAR | Status: AC
  Filled 2016-05-08: qty 30

## 2016-05-08 MED ORDER — MEPERIDINE HCL 25 MG/ML IJ SOLN: 6.2500 mg | INTRAMUSCULAR | Status: DC | PRN

## 2016-05-08 MED ORDER — ONDANSETRON HCL 4 MG/2ML IJ SOLN: 4.0000 mg | INTRAMUSCULAR | Status: DC | PRN

## 2016-05-08 MED ORDER — LIDOCAINE 2% (20 MG/ML) 5 ML SYRINGE
INTRAMUSCULAR | Status: AC
  Filled 2016-05-08: qty 5

## 2016-05-08 MED ORDER — EPHEDRINE SULFATE 50 MG/ML IJ SOLN
INTRAMUSCULAR | Status: DC | PRN
  Administered 2016-05-08 (×3): 5 mg via INTRAVENOUS

## 2016-05-08 MED ORDER — HYDROMORPHONE HCL 1 MG/ML IJ SOLN
INTRAMUSCULAR | Status: AC
  Filled 2016-05-08: qty 1

## 2016-05-08 MED ORDER — PHENYLEPHRINE 40 MCG/ML (10ML) SYRINGE FOR IV PUSH (FOR BLOOD PRESSURE SUPPORT)
PREFILLED_SYRINGE | INTRAVENOUS | Status: AC
  Filled 2016-05-08: qty 10

## 2016-05-08 MED ORDER — ACETAMINOPHEN 325 MG PO TABS: 650.0000 mg | ORAL_TABLET | ORAL | Status: DC | PRN

## 2016-05-08 MED ORDER — FENTANYL CITRATE (PF) 100 MCG/2ML IJ SOLN
INTRAMUSCULAR | Status: AC
  Filled 2016-05-08: qty 2

## 2016-05-08 MED ORDER — VANCOMYCIN HCL 1000 MG IV SOLR
INTRAVENOUS | Status: AC
  Filled 2016-05-08: qty 1000

## 2016-05-08 MED ORDER — ALBUMIN HUMAN 5 % IV SOLN
INTRAVENOUS | Status: DC | PRN
  Administered 2016-05-08 (×2): via INTRAVENOUS

## 2016-05-08 MED ORDER — CYCLOBENZAPRINE HCL 10 MG PO TABS
10.0000 mg | ORAL_TABLET | Freq: Three times a day (TID) | ORAL | Status: DC | PRN
  Administered 2016-05-09: 10 mg via ORAL
  Filled 2016-05-08: qty 1

## 2016-05-08 SURGICAL SUPPLY — 74 items
ADH SKN CLS APL DERMABOND .7 (GAUZE/BANDAGES/DRESSINGS) ×1
APL SKNCLS STERI-STRIP NONHPOA (GAUZE/BANDAGES/DRESSINGS) ×1
BAG DECANTER FOR FLEXI CONT (MISCELLANEOUS) ×3 IMPLANT
BENZOIN TINCTURE PRP APPL 2/3 (GAUZE/BANDAGES/DRESSINGS) ×3 IMPLANT
BIT DRILL PLIF MAS 5.0MM DISP (DRILL) IMPLANT
BLADE SURG 11 STRL SS (BLADE) ×3 IMPLANT
BONE MATRIX OSTEOCEL PRO MED (Bone Implant) ×2 IMPLANT
BUR CUTTER 7.0 ROUND (BURR) ×2 IMPLANT
BUR MATCHSTICK NEURO 3.0 LAGG (BURR) ×3 IMPLANT
CAGE COROENT PLIF 10X28-8 LUMB (Cage) ×4 IMPLANT
CANISTER SUCT 3000ML PPV (MISCELLANEOUS) ×3 IMPLANT
CARTRIDGE OIL MAESTRO DRILL (MISCELLANEOUS) ×1 IMPLANT
CLIP NEUROVISION LG (CLIP) ×2 IMPLANT
CLOSURE WOUND 1/2 X4 (GAUZE/BANDAGES/DRESSINGS) ×1
CONT SPEC 4OZ CLIKSEAL STRL BL (MISCELLANEOUS) ×3 IMPLANT
COVER BACK TABLE 60X90IN (DRAPES) ×3 IMPLANT
DERMABOND ADVANCED (GAUZE/BANDAGES/DRESSINGS) ×2
DERMABOND ADVANCED .7 DNX12 (GAUZE/BANDAGES/DRESSINGS) IMPLANT
DIFFUSER DRILL AIR PNEUMATIC (MISCELLANEOUS) ×3 IMPLANT
DRAPE C-ARM 42X72 X-RAY (DRAPES) ×3 IMPLANT
DRAPE C-ARMOR (DRAPES) ×3 IMPLANT
DRAPE LAPAROTOMY 100X72X124 (DRAPES) ×3 IMPLANT
DRAPE POUCH INSTRU U-SHP 10X18 (DRAPES) ×3 IMPLANT
DRAPE SURG 17X23 STRL (DRAPES) ×3 IMPLANT
DRILL PLIF MAS 5.0MM DISP (DRILL) ×3
DRSG OPSITE 4X5.5 SM (GAUZE/BANDAGES/DRESSINGS) ×2 IMPLANT
DRSG OPSITE POSTOP 4X6 (GAUZE/BANDAGES/DRESSINGS) ×2 IMPLANT
DURAPREP 26ML APPLICATOR (WOUND CARE) ×3 IMPLANT
ELECT BLADE 4.0 EZ CLEAN MEGAD (MISCELLANEOUS) ×3
ELECT REM PT RETURN 9FT ADLT (ELECTROSURGICAL) ×3
ELECTRODE BLDE 4.0 EZ CLN MEGD (MISCELLANEOUS) IMPLANT
ELECTRODE REM PT RTRN 9FT ADLT (ELECTROSURGICAL) ×1 IMPLANT
EVACUATOR 1/8 PVC DRAIN (DRAIN) ×1 IMPLANT
GAUZE SPONGE 4X4 12PLY STRL (GAUZE/BANDAGES/DRESSINGS) ×2 IMPLANT
GAUZE SPONGE 4X4 16PLY XRAY LF (GAUZE/BANDAGES/DRESSINGS) IMPLANT
GLOVE BIO SURGEON STRL SZ8 (GLOVE) ×5 IMPLANT
GLOVE BIOGEL PI IND STRL 7.0 (GLOVE) IMPLANT
GLOVE BIOGEL PI IND STRL 7.5 (GLOVE) IMPLANT
GLOVE BIOGEL PI INDICATOR 7.0 (GLOVE) ×4
GLOVE BIOGEL PI INDICATOR 7.5 (GLOVE) ×8
GLOVE INDICATOR 8.5 STRL (GLOVE) ×5 IMPLANT
GLOVE SURG SS PI 7.0 STRL IVOR (GLOVE) ×2 IMPLANT
GLOVE SURG SS PI 7.5 STRL IVOR (GLOVE) ×6 IMPLANT
GOWN STRL REUS W/ TWL LRG LVL3 (GOWN DISPOSABLE) IMPLANT
GOWN STRL REUS W/ TWL XL LVL3 (GOWN DISPOSABLE) ×2 IMPLANT
GOWN STRL REUS W/TWL 2XL LVL3 (GOWN DISPOSABLE) IMPLANT
GOWN STRL REUS W/TWL LRG LVL3 (GOWN DISPOSABLE) ×3
GOWN STRL REUS W/TWL XL LVL3 (GOWN DISPOSABLE) ×6
HEMOSTAT POWDER KIT SURGIFOAM (HEMOSTASIS) IMPLANT
KIT BASIN OR (CUSTOM PROCEDURE TRAY) ×3 IMPLANT
KIT INFUSE XX SMALL 0.7CC (Orthopedic Implant) ×2 IMPLANT
KIT ROOM TURNOVER OR (KITS) ×3 IMPLANT
MILL MEDIUM DISP (BLADE) ×2 IMPLANT
MODULE NVM5 NEXT GEN EMG (NEEDLE) ×2 IMPLANT
NDL HYPO 25X1 1.5 SAFETY (NEEDLE) ×1 IMPLANT
NEEDLE HYPO 25X1 1.5 SAFETY (NEEDLE) ×3 IMPLANT
NS IRRIG 1000ML POUR BTL (IV SOLUTION) ×3 IMPLANT
OIL CARTRIDGE MAESTRO DRILL (MISCELLANEOUS) ×3
PACK LAMINECTOMY NEURO (CUSTOM PROCEDURE TRAY) ×3 IMPLANT
PAD ARMBOARD 7.5X6 YLW CONV (MISCELLANEOUS) ×9 IMPLANT
ROD RELINE COCR LORD 5X40MM (Rod) ×4 IMPLANT
SCREW LOCK RSS 4.5/5.0MM (Screw) ×8 IMPLANT
SCREW RELINE RMM 5.0X35MM 4S (Screw) ×8 IMPLANT
SPONGE LAP 4X18 X RAY DECT (DISPOSABLE) IMPLANT
SPONGE SURGIFOAM ABS GEL 100 (HEMOSTASIS) ×5 IMPLANT
STRIP CLOSURE SKIN 1/2X4 (GAUZE/BANDAGES/DRESSINGS) ×3 IMPLANT
SUT VIC AB 0 CT1 18XCR BRD8 (SUTURE) ×1 IMPLANT
SUT VIC AB 0 CT1 8-18 (SUTURE) ×3
SUT VIC AB 2-0 CT1 18 (SUTURE) ×3 IMPLANT
SUT VIC AB 4-0 PS2 27 (SUTURE) ×3 IMPLANT
TOWEL OR 17X24 6PK STRL BLUE (TOWEL DISPOSABLE) ×3 IMPLANT
TOWEL OR 17X26 10 PK STRL BLUE (TOWEL DISPOSABLE) ×3 IMPLANT
TRAY FOLEY W/METER SILVER 16FR (SET/KITS/TRAYS/PACK) ×3 IMPLANT
WATER STERILE IRR 1000ML POUR (IV SOLUTION) ×3 IMPLANT

## 2016-05-08 NOTE — Anesthesia Preprocedure Evaluation (Addendum)
Anesthesia Evaluation  Patient identified by MRN, date of birth, ID band Patient awake    Reviewed: Allergy & Precautions, NPO status , Patient's Chart, lab work & pertinent test results  History of Anesthesia Complications Negative for: history of anesthetic complications  Airway Mallampati: II  TM Distance: >3 FB Neck ROM: Full    Dental  (+) Dental Advisory Given, Poor Dentition, Missing, Caps, Chipped   Pulmonary neg pulmonary ROS,    breath sounds clear to auscultation       Cardiovascular (-) anginanegative cardio ROS   Rhythm:Regular Rate:Normal     Neuro/Psych Chronic back pain    GI/Hepatic Neg liver ROS, GERD (s/p [place])  Medicated and Controlled,  Endo/Other  negative endocrine ROS  Renal/GU negative Renal ROS     Musculoskeletal  (+) Arthritis , Rheumatoid disorders and steroids,    Abdominal   Peds  Hematology negative hematology ROS (+)   Anesthesia Other Findings Prostate cancer  Reproductive/Obstetrics                            Anesthesia Physical Anesthesia Plan  ASA: III  Anesthesia Plan: General   Post-op Pain Management:    Induction: Intravenous  Airway Management Planned: Oral ETT  Additional Equipment:   Intra-op Plan:   Post-operative Plan: Extubation in OR  Informed Consent: I have reviewed the patients History and Physical, chart, labs and discussed the procedure including the risks, benefits and alternatives for the proposed anesthesia with the patient or authorized representative who has indicated his/her understanding and acceptance.   Dental advisory given  Plan Discussed with: CRNA and Surgeon  Anesthesia Plan Comments: (Plan routine monitors, GETA)        Anesthesia Quick Evaluation

## 2016-05-08 NOTE — Op Note (Signed)
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  #2 posterior lateral arthrodesis L4-5 utilizing locally harvested autograft mixed with OsteoSet pro and BMP.  #3 posterior drawer body fusion L4-5 utilizing the new invasive peek interbody cages packed with the locally harvested autograft mixed and BMP  #4 cortical screw fixation L4-5 utilizing the new invasive realign about chrome 50 cortical screw set  Surgeon: Kary Kos  Assistant: Marland Kitchen ditty  Anesthesia: Gen.  EBL: Minimal  History of present illness: Patient is a very pleasant 76 -year-old gentleman is a long-standing back and bilateral leg pain rating down and L4 and L5 nerve root pattern with a very short distances. Workup revealed severe spinal stenosis and a grade 1 spinal listhesis at L4-5. Due to patient's failure conservative treatment imaging findings and progression of clinical syndrome fusion at L4-5. I extensively went over the risks and benefits of the operation with the patient as well as perioperative course expectations of outcome alternatives of surgery and he understood and agreed to proceed forward.  Operative procedure: Patient brought into the or was induced under general anesthesia positioned prone the Wilson frame his back was prepped and draped in routine sterile fashion preoperative x-ray localize the appropriate level so after infiltration of 10 mL lidocaine with epi a midline incision was made and Bovie left car was used to take down the subcutaneous tissues and subperiosteal dissection was carried out on the lamina of L4-5. Interoperative x-ray confirmed the location. It level. So facet arthropathy and his  malalignment was not able play CL 4 cortical screws initially so I started off the decompression removed the spinous processes at L4. Central decompression was marked thecal sac compression and hourglass compression by marked facet arthropathy. I performed complete medial facetectomies drilled off the inferomedial aspect of both L4 pedicles identify the L4 foramen and unroofed the L4 nerve root aggressively under bit the super tickling process of L5 to gain access to the lateral margins of disc space. Then performed radical foraminotomies of the L5 nerve root also coagulated the epidural veins bilaterally disc space cleaned out the disc space bilaterally cleaned out extensive metal large lateral disc as well as a free fragment doses slightly superiorly the space on the right side behind the L4 nerve root. After complete discectomy then performed on thecal sac was decompressed. I then packed a 10 mm 8 lordotic cage with local autograft mixed and inserted on the patient's left side packed the autograft mix centrally and inserted the contralateral cage. After all the cage and confirmed be good position by x-ray dipstick cortical screw placement using a high-speed drill and intraoperative monitoring place cortical screws at L4 and L5 in routine fashion. Using a some purchase locking mechanism was cage posterior fluoroscopy confirmed good position of all implants. Was a go see her good fixing space was maintained X pro was injected and the fascia that wasn't prior was speckled and the wound I decorticated the lateral aspect of the facet joints and packed the local autograft mixed posterior laterally from L4-5. Then a similar rods anchored the knots in place reinspected the foramina to confirm patency no migration of graft material leg Gelfoam overlying the dura closed the wound in layers after placement of large Hemovac drain with interrupted  Vicryl running 4 subcuticular. Wound was then dressed patient patient  recovered in stable condition. At the end the case all needle counts and sponge counts were correct.

## 2016-05-08 NOTE — Progress Notes (Signed)
Pharmacy Antibiotic Note  [name] is a 76 y.o. male admitted on 05/08/2016 with surgical prophylaxis, drain in place.  Pharmacy has been consulted for vancomycin dosing.  Scr 1.18, est CrCl ~ 55 ml/min.  Plan: Vancomycin 1500 mg  IV every 24 hours.  Goal trough 10-15 mcg/mL.  F/u length of therapy, when drain is pulled. Watch renal function and cultures. Vancomycin trough level at steady state as needed.     Temp (24hrs), Avg:97.6 F (36.4 C), Min:97 F (36.1 C), Max:98 F (36.7 C)   Recent Labs Lab 05/02/16 0928  WBC 8.9  CREATININE 1.18    Estimated Creatinine Clearance: 55.9 mL/min (by C-G formula based on SCr of 1.18 mg/dL).    Allergies  Allergen Reactions  . Clindamycin/Lincomycin Hives, Shortness Of Breath and Swelling  . Enbrel [Etanercept] Swelling    SEVERE LEG SWELLING   . Penicillins Hives and Shortness Of Breath    Has patient had a PCN reaction causing immediate rash, facial/tongue/throat swelling, SOB or lightheadedness with hypotension: Yes Has patient had a PCN reaction causing severe rash involving mucus membranes or skin necrosis: Yes Has patient had a PCN reaction that required hospitalization No Has patient had a PCN reaction occurring within the last 10 years: No If all of the above answers are "NO", then may proceed with Cephalosporin use.   . Codeine Hives and Swelling    Antimicrobials this admission: Vancomycin 1/22 >  Dose adjustments this admission:   Microbiology results: none  Thank you for allowing pharmacy to be a part of this patient's care.  Uvaldo Rising, BCPS  Clinical Pharmacist Pager [phone]  05/08/2016 3:49 PM

## 2016-05-08 NOTE — H&P (Signed)
[name] is an 76 y.o. male.   Chief Complaint: Back and bilateral leg pain HPI: Patient is a very pleasant 76 year old gentleman with long-standing back and bilateral leg pain and neurogenic claudication. Patient's failed all forms of conservative treatment imaging findings revealed severe spinal stenosis and a spondylolisthesis and due to patient's failure conservative treatment imaging findings and progressive clinical syndrome I recommended decompression fusion L4-5. I extensively reviewed the risks and benefits of the operation with the patient as well as perioperative course expectations of outcome and alternatives of surgery and he understands and agrees to proceed forward.  Past Medical History:  Diagnosis Date  . Blood transfusion    " no reaction from transfusion"  . BPH (benign prostatic hyperplasia)   . Bruises easily   . Bursitis    chronic hip pain  . Cancer [facility])    prostate cancer cells  . Disc disease, degenerative, lumbar or lumbosacral    Chronic back pain now  . Diverticulitis   . Esophageal ring    requiring dilations   . GERD (gastroesophageal reflux disease)   . History of GI bleed    "vessels burst in colon" x2  . History of kidney stones    chronic  . History of vertigo   . Osteoarthritis   . Osteoporosis   . Pneumonia    yrs ago  . Rash    GROIN - ITCHING PAST 3 MONTHS - it is gone now 05/02/16  . Rheumatoid arthritis(714.0)    27 years  . Spondylisthesis   . Ureteral stone 11/2012    Past Surgical History:  Procedure Laterality Date  . BALLOON DILATION N/A 10/01/2012   Procedure: BALLOON DILATION;  Surgeon: Garlan Fair, MD;  Location: [facility];  Service: Endoscopy;  Laterality: N/A;  . COLECTOMY N/A 06/10/2013   Procedure: TOTAL abdominal COLECTOMY;  Surgeon: Odis Hollingshead, MD;  Location: [facility];  Service: General;  Laterality: N/A;  . COLONOSCOPY    . CYSTOSCOPY WITH URETEROSCOPY Right 12/05/2012   Procedure: CYSTOSCOPY WITH  RIGHT URETEROSCOPY AND STONE EXTRACTION;  Surgeon: Malka So, MD;  Location: [facility];  Service: Urology;  Laterality: Right;  . ESOPHAGOGASTRODUODENOSCOPY N/A 10/01/2012   Procedure: ESOPHAGOGASTRODUODENOSCOPY (EGD);  Surgeon: Garlan Fair, MD;  Location: [facility];  Service: Endoscopy;  Laterality: N/A;  . HERNIA REPAIR    . INGUINAL HERNIA REPAIR  2007   Bilateral   . Lithotripsy for nephrolithiasis     Pt and wife not sure when but here in [place]  . Lysis of Adhesion, small bowel resection  2009  . NISSEN FUNDOPLICATION     about [id number], pt not sure records not currently available  . TOTAL KNEE ARTHROPLASTY  2003   Bilateral  . TRANSURETHRAL PROSTATECTOMY WITH GYRUS INSTRUMENTS N/A 12/05/2012   Procedure: TRANSURETHRAL PROSTATECTOMY WITH GYRUS INSTRUMENTS;  Surgeon: Malka So, MD;  Location: [facility];  Service: Urology;  Laterality: N/A;  . Ventral Hernia Repair with Mesh      Family History  Problem Relation Age of Onset  . Adopted: Yes  . Heart failure Mother   . Heart failure Father    Social History:  reports that he has never smoked. He has never used smokeless tobacco. He reports that he does not drink alcohol or use drugs.  Allergies:  Allergies  Allergen Reactions  . Clindamycin/Lincomycin Hives, Shortness Of Breath and Swelling  . Enbrel [Etanercept] Swelling    SEVERE LEG SWELLING   . Penicillins  Hives and Shortness Of Breath    Has patient had a PCN reaction causing immediate rash, facial/tongue/throat swelling, SOB or lightheadedness with hypotension: Yes Has patient had a PCN reaction causing severe rash involving mucus membranes or skin necrosis: Yes Has patient had a PCN reaction that required hospitalization No Has patient had a PCN reaction occurring within the last 10 years: No If all of the above answers are "NO", then may proceed with Cephalosporin use.   . Codeine Hives and Swelling    Medications Prior to Admission  Medication Sig  Dispense Refill  . acetaminophen (TYLENOL) 650 MG CR tablet Take 650-1,300 mg by mouth 2 (two) times daily. 1300 mg in the morning & 650 mg at night    . leflunomide (ARAVA) 20 MG tablet Take 20 mg by mouth daily.     Marland Kitchen omeprazole (PRILOSEC) 20 MG capsule Take 20 mg by mouth daily.      . predniSONE (DELTASONE) 5 MG tablet Take 5 mg by mouth daily.    Marland Kitchen tetrahydrozoline-zinc (VISINE-AC) 0.05-0.25 % ophthalmic solution Place 2 drops into both eyes 3 (three) times daily as needed (for allergies).    . traMADol (ULTRAM) 50 MG tablet Take 50 mg by mouth 2 (two) times daily.      No results found for this or any previous visit (from the past 48 hour(s)). No results found.  Review of Systems  Musculoskeletal: Positive for back pain and myalgias.  Neurological: Positive for tingling and sensory change.    Blood pressure (!) 167/94, pulse 61, temperature 98 F (36.7 C), temperature source Oral, resp. rate 18, SpO2 98 %. Physical Exam  Constitutional: He is oriented to person, place, and time. He appears well-developed and well-nourished.  HENT:  Head: Normocephalic.  Eyes: Pupils are equal, round, and reactive to light.  Neck: Normal range of motion.  Respiratory: Effort normal.  GI: Soft. Bowel sounds are normal.  Neurological: He is alert and oriented to person, place, and time.  Strength 5 out of 5 iliopsoas, quads, hip she's, gastric, into tibialis, and EHL.     Assessment/Plan 76 year old gentleman presents for decompression fusion L4-5  Bartolo Montanye P, MD 05/08/2016, 9:30 AM

## 2016-05-08 NOTE — Transfer of Care (Signed)
Immediate Anesthesia Transfer of Care Note  Patient: [name]  Procedure(s) Performed: Procedure(s): LUMBAR FOUR-FIVE FOR MAXIMUM ACCESS (MAS) POSTERIOR LUMBAR INTERBODY FUSION (N/A)  Patient Location: PACU  Anesthesia Type:General  Level of Consciousness: patient cooperative, asleep   Airway & Oxygen Therapy: Patient Spontanous Breathing  Post-op Assessment: Report given to RN and Post -op Vital signs reviewed and stable  Post vital signs: Reviewed and stable  Last Vitals:  Vitals:   05/08/16 0759  BP: (!) 167/94  Pulse: 61  Resp: 18  Temp: 36.7 C    Last Pain:  Vitals:   05/08/16 0759  TempSrc: Oral         Complications: No apparent anesthesia complications

## 2016-05-08 NOTE — Anesthesia Procedure Notes (Signed)
Procedure Name: Intubation Date/Time: 05/08/2016 10:19 AM Performed by: Merdis Delay Pre-anesthesia Checklist: Patient identified, Emergency Drugs available, Suction available, Patient being monitored and Timeout performed Patient Re-evaluated:Patient Re-evaluated prior to inductionOxygen Delivery Method: Circle system utilized Preoxygenation: Pre-oxygenation with 100% oxygen Intubation Type: IV induction Ventilation: Mask ventilation without difficulty Laryngoscope Size: Mac and 4 Grade View: Grade I Tube type: Oral Tube size: 7.5 mm Number of attempts: 1 Airway Equipment and Method: Stylet Placement Confirmation: ETT inserted through vocal cords under direct vision,  positive ETCO2,  CO2 detector and breath sounds checked- equal and bilateral Secured at: 22 cm Tube secured with: Tape Dental Injury: Teeth and Oropharynx as per pre-operative assessment

## 2016-05-08 NOTE — Anesthesia Postprocedure Evaluation (Signed)
Anesthesia Post Note  Patient: [name]  Procedure(s) Performed: Procedure(s) (LRB): LUMBAR FOUR-FIVE FOR MAXIMUM ACCESS (MAS) POSTERIOR LUMBAR INTERBODY FUSION (N/A)  Patient location during evaluation: PACU Anesthesia Type: General Level of consciousness: awake and alert, patient cooperative and oriented Pain management: pain level controlled Vital Signs Assessment: post-procedure vital signs reviewed and stable Respiratory status: spontaneous breathing, nonlabored ventilation and respiratory function stable Cardiovascular status: blood pressure returned to baseline and stable Postop Assessment: no signs of nausea or vomiting Anesthetic complications: no       Last Vitals:  Vitals:   05/08/16 0759  BP: (!) 167/94  Pulse: 61  Resp: 18  Temp: 36.7 C    Last Pain:  Vitals:   05/08/16 0759  TempSrc: Oral                 [name],E. Kemoni Quesenberry

## 2016-05-08 NOTE — Evaluation (Signed)
Physical Therapy Evaluation Patient Details Name: [name] MRN: [id number] DOB: [birth year] Today's Date: 05/08/2016   History of Present Illness  Patient is a 76 y/o male with hx of prostate ca presents s/p L4-5 PLIF.  Clinical Impression  Patient presents with discomfort and post surgical deficits s/p above surgery. Tolerated gait training with min guard assist for safety. Education re: back precautions, reviewed handout, positioning etc. Pt independent PTA. Will have support of wife at d/c. Will follow acutely to maximize independence and mobility prior to return home. Will plan for stair training tomorrow as tolerated.     Follow Up Recommendations No PT follow up;Supervision - Intermittent    Equipment Recommendations  None recommended by PT    Recommendations for Other Services       Precautions / Restrictions Precautions Precautions: Fall Required Braces or Orthoses: Spinal Brace Spinal Brace: Lumbar corset Restrictions Weight Bearing Restrictions: No      Mobility  Bed Mobility Overal bed mobility: Needs Assistance Bed Mobility: Rolling;Sidelying to Sit Rolling: Supervision Sidelying to sit: Supervision;HOB elevated       General bed mobility comments: Cues for log roll technique, no physical assist needed, use of rail for support.  Transfers Overall transfer level: Needs assistance Equipment used: None Transfers: Sit to/from Stand Sit to Stand: Min guard         General transfer comment: Min guard for safety. Stood from [employer] wihtout assist. Transferred to chair post ambulation bout.  Ambulation/Gait Ambulation/Gait assistance: Min guard Ambulation Distance (Feet): 250 Feet Assistive device:  (pushing IV pole) Gait Pattern/deviations: Step-through pattern;Decreased stride length Gait velocity: decreased   General Gait Details: Slow, steady gait pushing IV pole for support. Cues to increase cadence.   Stairs            Wheelchair  Mobility    Modified Rankin (Stroke Patients Only)       Balance Overall balance assessment: Needs assistance Sitting-balance support: Feet supported;No upper extremity supported Sitting balance-Leahy Scale: Good Sitting balance - Comments: Wife assisted with donning sjhoes; able to donn brace with setup   Standing balance support: During functional activity Standing balance-Leahy Scale: Fair Standing balance comment: Able to stand statically without UE support.                             Pertinent Vitals/Pain Pain Assessment: No/denies pain    Home Living Family/patient expects to be discharged to:: Private residence Living Arrangements: Spouse/significant other Available Help at Discharge: Family;Available 24 hours/day Type of Home: House Home Access: Level entry     Home Layout: Two level;Bed/bath upstairs Home Equipment: Walker - 2 wheels;Cane - single point      Prior Function Level of Independence: Independent         Comments: Drives, cleans. Active.     Hand Dominance   Dominant Hand: Right    Extremity/Trunk Assessment   Upper Extremity Assessment Upper Extremity Assessment: Defer to OT evaluation    Lower Extremity Assessment Lower Extremity Assessment: LLE deficits/detail;RLE deficits/detail RLE Deficits / Details: some mild tingling reported RLE Sensation: history of peripheral neuropathy LLE Deficits / Details: some mild tingling reported LLE Sensation: history of peripheral neuropathy    Cervical / Trunk Assessment Cervical / Trunk Assessment: Other exceptions Cervical / Trunk Exceptions: s/p spine surgery  Communication   Communication: No difficulties  Cognition Arousal/Alertness: Awake/alert Behavior During Therapy: WFL for tasks assessed/performed Overall Cognitive Status: Within Functional Limits for  tasks assessed                      General Comments General comments (skin integrity, edema, etc.): Wife  present during session.    Exercises     Assessment/Plan    PT Assessment Patient needs continued PT services  PT Problem List Decreased strength;Decreased mobility;Decreased knowledge of precautions;Decreased balance;Decreased skin integrity          PT Treatment Interventions Therapeutic activities;DME instruction;Gait training;Therapeutic exercise;Patient/family education;Balance training;Stair training;Functional mobility training    PT Goals (Current goals can be found in the Care Plan section)  Acute Rehab PT Goals Patient Stated Goal: to get back to playing golf PT Goal Formulation: With patient Time For Goal Achievement: 05/22/16 Potential to Achieve Goals: Good    Frequency Min 5X/week   Barriers to discharge Inaccessible home environment stairs to get to bedroom    Co-evaluation               End of Session Equipment Utilized During Treatment: Gait belt;Back brace Activity Tolerance: Patient tolerated treatment well Patient left: in chair;with call bell/phone within reach;with family/visitor present Nurse Communication: Mobility status         Time: [id number] PT Time Calculation (min) (ACUTE ONLY): 25 min   Charges:   PT Evaluation $PT Eval Low Complexity: 1 Procedure PT Treatments $Gait Training: 8-22 mins   PT G Codes:        Daviyon Widmayer A Odel Schmid 05/08/2016, 4:48 PM Wray Kearns, [place], DPT [phone]

## 2016-05-09 ENCOUNTER — Encounter (HOSPITAL_COMMUNITY): Payer: Self-pay | Admitting: Neurosurgery

## 2016-05-09 LAB — POCT I-STAT 4, (NA,K, GLUC, HGB,HCT)
GLUCOSE: 125 mg/dL — AB (ref 65–99)
HEMATOCRIT: 33 % — AB (ref 39.0–52.0)
Hemoglobin: 11.2 g/dL — ABNORMAL LOW (ref 13.0–17.0)
Potassium: 4.1 mmol/L (ref 3.5–5.1)
Sodium: 140 mmol/L (ref 135–145)

## 2016-05-09 MED ORDER — OXYCODONE-ACETAMINOPHEN 5-325 MG PO TABS: 1.0000 | ORAL_TABLET | ORAL | 0 refills | Status: DC | PRN

## 2016-05-09 NOTE — Discharge Summary (Signed)
  Physician Discharge Summary  Patient ID: [name] MRN: [id number] DOB/AGE: 1940/05/24 76 y.o.  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  Consults: Significant Diagnostic Studies: Treatments: L4-5 decompression fusion Discharge Exam: Blood pressure (!) 166/97, pulse 80, temperature 98.2 F (36.8 C), resp. rate 18, SpO2 100 %. Strength out of 5 wound clean dry and intact  Disposition: Home   Allergies as of 05/09/2016      Reactions   Clindamycin/lincomycin Hives, Shortness Of Breath, Swelling   Enbrel [etanercept] Swelling   SEVERE LEG SWELLING   Penicillins Hives, Shortness Of Breath   Has patient had a PCN reaction causing immediate rash, facial/tongue/throat swelling, SOB or lightheadedness with hypotension: Yes Has patient had a PCN reaction causing severe rash involving mucus membranes or skin necrosis: Yes Has patient had a PCN reaction that required hospitalization No Has patient had a PCN reaction occurring within the last 10 years: No If all of the above answers are "NO", then may proceed with Cephalosporin use.   Codeine Hives, Swelling      Medication List    TAKE these medications   acetaminophen 650 MG CR tablet Commonly known as:  TYLENOL Take 650-1,300 mg by mouth 2 (two) times daily. 1300 mg in the morning & 650 mg at night   leflunomide 20 MG tablet Commonly known as:  ARAVA Take 20 mg by mouth daily.   omeprazole 20 MG capsule Commonly known as:  PRILOSEC Take 20 mg by mouth daily.   oxyCODONE-acetaminophen 5-325 MG  tablet Commonly known as:  PERCOCET/ROXICET Take 1-2 tablets by mouth every 4 (four) hours as needed for moderate pain.   predniSONE 5 MG tablet Commonly known as:  DELTASONE Take 5 mg by mouth daily.   tetrahydrozoline-zinc 0.05-0.25 % ophthalmic solution Commonly known as:  VISINE-AC Place 2 drops into both eyes 3 (three) times daily as needed (for allergies).   traMADol 50 MG tablet Commonly known as:  ULTRAM Take 50 mg by mouth 2 (two) times daily.        Signed: Patryk Conant P 05/09/2016, 7:59 AM

## 2016-05-09 NOTE — Progress Notes (Signed)
Patient alert and oriented, mae's well, voiding adequate amount of urine, swallowing without difficulty, c/o mild pain and medication given prior to discharged. Patient discharged home with family. Script and discharged instructions given to patient. Patient and family stated understanding of instructions given. Patient has an appointment with Dr. Saintclair Halsted in 2 weeks

## 2016-05-09 NOTE — Evaluation (Addendum)
Occupational Therapy Evaluation and Discharge Patient Details Name: [name] MRN: [id number] DOB: 04-02-1941 Today's Date: 05/09/2016    History of Present Illness Patient is a 76 y/o male with hx of prostate ca presents s/p L4-5 PLIF.   Clinical Impression   This 76 yo male admitted with above presents to acute OT with all education completed with pt and wife. Acute OT will sign off.    Follow Up Recommendations  No OT follow up    Equipment Recommendations  None recommended by OT       Precautions / Restrictions Precautions Precautions: Back Precaution Booklet Issued: Yes (comment) Precaution Comments: pt able to recall all precautions, v/c's to adhere during function Required Braces or Orthoses: Spinal Brace Spinal Brace: Lumbar corset;Applied in standing position Restrictions Weight Bearing Restrictions: No      Mobility Bed Mobility Overal bed mobility: Needs Assistance Bed Mobility: Rolling;Sidelying to Sit Rolling: Modified independent (Device/Increase time) Sidelying to sit: Modified independent (Device/Increase time)       General bed mobility comments: used bed rail  Transfers Overall transfer level: Modified independent Equipment used: None Transfers: Sit to/from Stand Sit to Stand: Modified independent (Device/Increase time)         General transfer comment: no difficulty, good use of hands    Balance Overall balance assessment: No apparent balance deficits (not formally assessed)         Standing balance support: During functional activity Standing balance-Leahy Scale: Fair Standing balance comment: pt held onto bed rail with R UE and lifted L LE to don shoe                            ADL                                         General ADL Comments: Educated pt and wife on best sequence of getting dressed, how to brush teeth without bending over sink, to use wet wipes for back peri care post bowel  movement, to side step into/out of tub, and to cross one leg over the other while in a seated position bringing foot up to him and not bending over to don socks and shoes, how to easily get up and down from toilet.               Pertinent Vitals/Pain Pain Assessment: 0-10 Pain Score: 3  Pain Location: surgical site Pain Descriptors / Indicators: Sore Pain Intervention(s): Monitored during session     Hand Dominance Right   Extremity/Trunk Assessment Upper Extremity Assessment Upper Extremity Assessment: Overall WFL for tasks assessed           Communication Communication Communication: No difficulties   Cognition Arousal/Alertness: Awake/alert Behavior During Therapy: WFL for tasks assessed/performed Overall Cognitive Status: Within Functional Limits for tasks assessed                                Home Living Family/patient expects to be discharged to:: Private residence Living Arrangements: Spouse/significant other Available Help at Discharge: Family;Available 24 hours/day Type of Home: House Home Access: Level entry     Home Layout: Two level;Bed/bath upstairs Alternate Level Stairs-Number of Steps: 1 flight Alternate Level Stairs-Rails: Right Bathroom Shower/Tub: Tub/shower unit;Curtain Shower/tub characteristics: Architectural technologist: Handicapped height  Home Equipment: [employer] - 2 wheels;Cane - single point          Prior Functioning/Environment Level of Independence: Independent        Comments: Drives, cleans. Active.              OT Goals(Current goals can be found in the care plan section) Acute Rehab OT Goals Patient Stated Goal: home today  OT Frequency:                End of Session Nurse Communication:  (no further OT needs)  Activity Tolerance: Patient tolerated treatment well Patient left: in chair;with call bell/phone within reach;with family/visitor present   Time: [id number] OT Time Calculation (min):  11 min Charges:  OT General Charges $OT Visit: 1 Procedure OT Evaluation $OT Eval Moderate Complexity: 1 Procedure  Almon Register [id number] 05/09/2016, 8:08 AM

## 2016-05-09 NOTE — Progress Notes (Signed)
Physical Therapy Treatment and DISCHARGE Patient Details Name: [name] MRN: [id number] DOB: [birth year] Today's Date: 05/09/2016    History of Present Illness Patient is a 76 y/o male with hx of prostate ca presents s/p L4-5 PLIF.    PT Comments    Pt mobilizing well with good understanding of back precautions. Pt functioning at supervision level in which wife will be providing at home. Pt able to safely navigate flight of stairs to access bed/bath. Pt with no further acute PT needs at this time. PT SIGNING OFF. Please re-consult if needed in future.  Follow Up Recommendations  No PT follow up     Equipment Recommendations  None recommended by PT    Recommendations for Other Services       Precautions / Restrictions Precautions Precautions: Back Precaution Booklet Issued: Yes (comment) Precaution Comments: pt able to recall all precautions, v/c's to adhere during function Required Braces or Orthoses: Spinal Brace Spinal Brace: Lumbar corset;Applied in standing position Restrictions Weight Bearing Restrictions: No    Mobility  Bed Mobility Overal bed mobility: Needs Assistance Bed Mobility: Rolling;Sidelying to Sit Rolling: Modified independent (Device/Increase time) Sidelying to sit: Modified independent (Device/Increase time)       General bed mobility comments: used bed rail  Transfers Overall transfer level: Modified independent Equipment used: None Transfers: Sit to/from Stand Sit to Stand: Modified independent (Device/Increase time)         General transfer comment: no difficulty, good use of hands  Ambulation/Gait Ambulation/Gait assistance: Supervision Ambulation Distance (Feet): 300 Feet Assistive device: None Gait Pattern/deviations: WFL(Within Functional Limits) Gait velocity: wfl considering surgery Gait velocity interpretation: at or above normal speed for age/gender General Gait Details: v/c's to contract abdominal muscles to support  back and minimize trunk flexion   Stairs Stairs: Yes   Stair Management: One rail Left;Alternating pattern Number of Stairs: 12 General stair comments: no difficulty  Wheelchair Mobility    Modified Rankin (Stroke Patients Only)       Balance Overall balance assessment: No apparent balance deficits (not formally assessed)         Standing balance support: During functional activity Standing balance-Leahy Scale: Fair Standing balance comment: pt held onto bed rail with R UE and lifted L LE to don shoe                    Cognition Arousal/Alertness: Awake/alert Behavior During Therapy: WFL for tasks assessed/performed Overall Cognitive Status: Within Functional Limits for tasks assessed                      Exercises      General Comments        Pertinent Vitals/Pain Pain Assessment: 0-10 Pain Score: 3  Pain Location: surgical site Pain Descriptors / Indicators: Sore Pain Intervention(s): Monitored during session    Home Living                      Prior Function            PT Goals (current goals can now be found in the care plan section) Acute Rehab PT Goals Patient Stated Goal: home today Progress towards PT goals: Goals met/education completed, patient discharged from PT    Frequency    Other (Comment) (d/c from actue PT)      PT Plan Current plan remains appropriate (d/c from acute PT)    Co-evaluation  End of Session Equipment Utilized During Treatment: Back brace Activity Tolerance: Patient tolerated treatment well Patient left: in chair;with call bell/phone within reach;with family/visitor present     Time: 0717-0730 PT Time Calculation (min) (ACUTE ONLY): 13 min  Charges:  $Gait Training: 8-22 mins                    G Codes:      Berline Lopes 2016/05/20, 7:37 AM  Kittie Plater, PT, DPT Pager #: [phone] Office #: [phone]

## 2016-05-09 NOTE — Discharge Instructions (Signed)

## 2016-05-15 ENCOUNTER — Telehealth: Payer: Self-pay | Admitting: Oncology

## 2016-05-15 NOTE — Telephone Encounter (Signed)
Pt called to confirm 2/13 appt date/timeper voicemail that was left

## 2016-05-17 ENCOUNTER — Ambulatory Visit: Payer: Medicare Other | Admitting: Oncology

## 2016-05-30 ENCOUNTER — Telehealth: Payer: Self-pay | Admitting: Oncology

## 2016-05-30 ENCOUNTER — Ambulatory Visit (HOSPITAL_BASED_OUTPATIENT_CLINIC_OR_DEPARTMENT_OTHER): Payer: PPO | Admitting: Oncology

## 2016-05-30 VITALS — BP 153/89 | HR 72 | Temp 98.2°F | Resp 16 | Ht 67.5 in | Wt 176.5 lb

## 2016-05-30 DIAGNOSIS — G629 Polyneuropathy, unspecified: Secondary | ICD-10-CM | POA: Diagnosis not present

## 2016-05-30 DIAGNOSIS — D472 Monoclonal gammopathy: Secondary | ICD-10-CM | POA: Diagnosis not present

## 2016-05-30 NOTE — Progress Notes (Signed)
Hematology and Oncology Follow Up Visit  [name] [id number] [birth year] 76 y.o. 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 At that time he had an M spike of 2 g/dL and 8% plasma cell involvement in the bone marrow. He had no signs or symptoms of end organ damage.   Prior Therapy: Status post bone marrow biopsy February 2012.  Current therapy: Observation and surveillance.  Interim History: [name] presents today for a follow-up visit. He is a gentleman was seen in the past at the [facility] for MGUS as well as iron deficiency anemia. He was diagnosed with diverticulosis and underwent partial colectomy which have resolved this issue. He is no longer requiring any transfusion or IV iron infusion. His workup for multiple myeloma was unrevealing under the care of Dr. Audelia [name] in 2012.  He was seen by his neurologist recently for neuropathy. Repeat serum protein electrophoresis done on 03/24/2016 showed an M spike of 1.2 g/dL. He was asked to return for a follow-up regarding this issue. Clinically, he reports feeling reasonably well. He underwent lumbar spine operation in January 2018 and him presumed most activities of daily living. He is able to drive and his pain free at this time. 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  He does not report any headaches, blurry vision, syncope or seizures. He does not report any fevers, chills or sweats. He does not report any cough, wheezing or hemoptysis. He does not report any nausea, vomiting or abdominal pain. He does not report any frequency urgency or hesitancy. He does not report any skeletal complaints. Remaining review of systems unremarkable.  Medications: I have reviewed the patient's current medications.  Current Outpatient Prescriptions  Medication Sig Dispense Refill  . acetaminophen  (TYLENOL) 650 MG CR tablet Take 650-1,300 mg by mouth 2 (two) times daily. 1300 mg in the morning & 650 mg at night    . leflunomide (ARAVA) 20 MG tablet Take 20 mg by mouth daily.     Marland Kitchen omeprazole (PRILOSEC) 20 MG capsule Take 20 mg by mouth daily.      . predniSONE (DELTASONE) 5 MG tablet Take 5 mg by mouth daily.    Marland Kitchen tetrahydrozoline-zinc (VISINE-AC) 0.05-0.25 % ophthalmic solution Place 2 drops into both eyes 3 (three) times daily as needed (for allergies).    . traMADol (ULTRAM) 50 MG tablet Take 50 mg by mouth 2 (two) times daily.     No current facility-administered medications for this visit.      Allergies:  Allergies  Allergen Reactions  . Clindamycin/Lincomycin [name], Shortness Of Breath and Swelling  . Enbrel [Etanercept] Swelling    SEVERE LEG SWELLING   . Penicillins [name] and Shortness Of Breath    Has patient had a PCN reaction causing immediate rash, facial/tongue/throat swelling, SOB or lightheadedness with hypotension: Yes Has patient had a PCN reaction causing severe rash involving mucus membranes or skin necrosis: Yes Has patient had a PCN reaction that required hospitalization No Has patient had a PCN reaction occurring within the last 10 years: No If all of the above answers are "NO", then may proceed with Cephalosporin use.   . Codeine [name] and Swelling    Past Medical History, Surgical history, Social history, and Family History were reviewed and updated.  Marland Kitchen Physical Exam: Blood pressure (!) 153/89, pulse 72, temperature 98.2 F (36.8 C), temperature source Oral,  resp. rate 16, height 5' 7.5" (1.715 m), weight 176 lb 8 oz (80.1 kg), SpO2 100 %. ECOG: 0 General appearance: alert and cooperative Head: Normocephalic, without obvious abnormality Neck: no adenopathy Lymph nodes: Cervical, supraclavicular, and axillary nodes normal. Heart:regular rate and rhythm, S1, S2 normal, no murmur, click, rub or gallop Lung:chest clear, no wheezing, rales, normal  symmetric air entry Abdomin: soft, non-tender, without masses or organomegaly EXT:no erythema, induration, or nodules   Lab Results: Lab Results  Component Value Date   WBC 8.9 05/02/2016   HGB 11.2 (L) 05/08/2016   HCT 33.0 (L) 05/08/2016   MCV 87.0 05/02/2016   PLT 262 05/02/2016     Chemistry      Component Value Date/Time   NA 140 05/08/2016 1238   K 4.1 05/08/2016 1238   CL 106 05/02/2016 0928   CO2 27 05/02/2016 0928   BUN 12 05/02/2016 0928   CREATININE 1.18 05/02/2016 0928      Component Value Date/Time   CALCIUM 9.5 05/02/2016 0928   ALKPHOS 71 06/20/2015 1632   AST 20 06/20/2015 1632   ALT 12 (L) 06/20/2015 1632   BILITOT 0.9 06/20/2015 1632      Impression and Plan:   76 year old gentleman with the following issues:  1. IgG MGUS diagnosed in 2012. He had no signs or symptoms to suggest multiple myeloma at that time. Bone marrow biopsy showed 8% plasma cell infiltration. His M spike at that time was 2.1 g/dL.  The natural course of this disease was reviewed with the patient today. Over this period of time where he has not been followed, there is nothing to suggest evolving myeloma. His most recent M spike showed actually reduction which does not suggest progression at this time.  I recommended continued observation and surveillance and repeat his serum protein electrophoresis in one year and follow him clinically on an annual basis. His risk of progression remains about 1% per year.   2. The peripheral neuropathy: I do not think this is related to a plasma cell disorder. He continues to follow with neurology at this time. Lumbar spine operation might help his symptoms.  2. Follow-up: Will be in 12 months sooner if needed to.     [name] Button, MD 2/13/20181:55 PM

## 2016-05-30 NOTE — Telephone Encounter (Signed)
Appointments scheduled per 05/30/16 los. Patient was given a copy of the appointment schedule and AVS report, per 05/30/16 los. °

## 2016-06-13 DIAGNOSIS — M4316 Spondylolisthesis, lumbar region: Secondary | ICD-10-CM | POA: Diagnosis not present

## 2016-06-27 DIAGNOSIS — H353121 Nonexudative age-related macular degeneration, left eye, early dry stage: Secondary | ICD-10-CM | POA: Diagnosis not present

## 2016-06-27 DIAGNOSIS — H2513 Age-related nuclear cataract, bilateral: Secondary | ICD-10-CM | POA: Diagnosis not present

## 2016-06-28 ENCOUNTER — Ambulatory Visit (INDEPENDENT_AMBULATORY_CARE_PROVIDER_SITE_OTHER): Payer: PPO | Admitting: Diagnostic Neuroimaging

## 2016-06-28 ENCOUNTER — Encounter: Payer: Self-pay | Admitting: Diagnostic Neuroimaging

## 2016-06-28 VITALS — BP 179/91 | HR 70 | Wt 179.2 lb

## 2016-06-28 DIAGNOSIS — M48062 Spinal stenosis, lumbar region with neurogenic claudication: Secondary | ICD-10-CM | POA: Diagnosis not present

## 2016-06-28 DIAGNOSIS — G609 Hereditary and idiopathic neuropathy, unspecified: Secondary | ICD-10-CM

## 2016-06-28 NOTE — Progress Notes (Signed)
[facility]  PATIENT: [name] DOB: [birth year]  REFERRING CLINICIAN: H Stoneking HISTORY FROM: patient and wife REASON FOR VISIT: follow up    HISTORICAL  CHIEF COMPLAINT:  Chief Complaint  Patient presents with  . Spinal stenosis, lumbar region    rm 6, "surgery on my back has helped pain quite a bit"  . Follow-up    3 month    HISTORY OF PRESENT ILLNESS:   UPDATE 06/28/16: Since last visit, doing better. Had back surgery in Jan 2018, and low back and feet are improved. Some numbness continues. Overall doing well.  PRIOR HPI (03/24/17): 76 year old right-handed male here for valuation of neuropathy. Patient has history of rheumatoid arthritis, osteoarthritis, lumbar spinal stenosis, bilateral knee replacements. Past 8-10 years patient has had onset of numbness, tingling, burning pain in his calves and ankles and feet. Over the past 5 years he has had low back pain radiating into his legs. Both of these symptoms have worsened in the last 2-3 years. 2014 patient had EMG nerve conduction study demonstrating severe peripheral neuropathy as well as lumbar radiculopathy. Patient has MRI of the lumbar spine showing severe spinal stenosis at L4-5. Patient has been treating this conservatively with epidural steroid injections and physical therapy. Patient has 20-30 years of rheumatoid arthritis on multiple disease modifying therapies. Currently on arava and prednisone.    REVIEW OF SYSTEMS: Full 14 system review of systems performed and negative with exception of: negative except as per HPI.   ALLERGIES: Allergies  Allergen Reactions  . Clindamycin/Lincomycin Hives, Shortness Of Breath and Swelling  . Enbrel [Etanercept] Swelling    SEVERE LEG SWELLING   . Penicillins Hives and Shortness Of Breath    Has patient had a PCN reaction causing immediate rash, facial/tongue/throat swelling, SOB or lightheadedness with hypotension: Yes Has patient had a PCN  reaction causing severe rash involving mucus membranes or skin necrosis: Yes Has patient had a PCN reaction that required hospitalization No Has patient had a PCN reaction occurring within the last 10 years: No If all of the above answers are "NO", then may proceed with Cephalosporin use.   . Codeine Hives and Swelling    HOME MEDICATIONS: Outpatient Medications Prior to Visit  Medication Sig Dispense Refill  . acetaminophen (TYLENOL) 650 MG CR tablet Take 650-1,300 mg by mouth 2 (two) times daily. 1300 mg in the morning & 650 mg at night    . leflunomide (ARAVA) 20 MG tablet Take 20 mg by mouth daily.     Marland Kitchen omeprazole (PRILOSEC) 20 MG capsule Take 20 mg by mouth daily.      . predniSONE (DELTASONE) 5 MG tablet Take 5 mg by mouth daily.    Marland Kitchen tetrahydrozoline-zinc (VISINE-AC) 0.05-0.25 % ophthalmic solution Place 2 drops into both eyes 3 (three) times daily as needed (for allergies).    . traMADol (ULTRAM) 50 MG tablet Take 50 mg by mouth 2 (two) times daily.     No facility-administered medications prior to visit.     PAST MEDICAL HISTORY: Past Medical History:  Diagnosis Date  . Blood transfusion    " no reaction from transfusion"  . BPH (benign prostatic hyperplasia)   . Bruises easily   . Bursitis    chronic hip pain  . Cancer [facility])    prostate cancer cells  . Disc disease, degenerative, lumbar or lumbosacral    Chronic back pain now  . Diverticulitis   . Esophageal ring    requiring dilations   .  GERD (gastroesophageal reflux disease)   . History of GI bleed    "vessels burst in colon" x2  . History of kidney stones    chronic  . History of vertigo   . Osteoarthritis   . Osteoporosis   . Pneumonia    yrs ago  . Rash    GROIN - ITCHING PAST 3 MONTHS - it is gone now 05/02/16  . Rheumatoid arthritis(714.0)    27 years  . Spondylisthesis   . Ureteral stone 11/2012    PAST SURGICAL HISTORY: Past Surgical History:  Procedure Laterality Date  . BALLOON DILATION  N/A 10/01/2012   Procedure: BALLOON DILATION;  Surgeon: Garlan Fair, MD;  Location: [facility];  Service: Endoscopy;  Laterality: N/A;  . COLECTOMY N/A 06/10/2013   Procedure: TOTAL abdominal COLECTOMY;  Surgeon: Odis Hollingshead, MD;  Location: [facility];  Service: General;  Laterality: N/A;  . COLONOSCOPY    . CYSTOSCOPY WITH URETEROSCOPY Right 12/05/2012   Procedure: CYSTOSCOPY WITH RIGHT URETEROSCOPY AND STONE EXTRACTION;  Surgeon: Malka So, MD;  Location: [facility];  Service: Urology;  Laterality: Right;  . ESOPHAGOGASTRODUODENOSCOPY N/A 10/01/2012   Procedure: ESOPHAGOGASTRODUODENOSCOPY (EGD);  Surgeon: Garlan Fair, MD;  Location: [facility];  Service: Endoscopy;  Laterality: N/A;  . HERNIA REPAIR    . INGUINAL HERNIA REPAIR  2007   Bilateral   . Lithotripsy for nephrolithiasis     Pt and wife not sure when but here in [place]  . Lysis of Adhesion, small bowel resection  2009  . MAXIMUM ACCESS (MAS)POSTERIOR LUMBAR INTERBODY FUSION (PLIF) 1 LEVEL N/A 05/08/2016   Procedure: LUMBAR FOUR-FIVE FOR MAXIMUM ACCESS (MAS) POSTERIOR LUMBAR INTERBODY FUSION;  Surgeon: Kary Kos, MD;  Location: [facility];  Service: Neurosurgery;  Laterality: N/A;  . NISSEN FUNDOPLICATION     about 8502, pt not sure records not currently available  . TOTAL KNEE ARTHROPLASTY  2003   Bilateral  . TRANSURETHRAL PROSTATECTOMY WITH GYRUS INSTRUMENTS N/A 12/05/2012   Procedure: TRANSURETHRAL PROSTATECTOMY WITH GYRUS INSTRUMENTS;  Surgeon: Malka So, MD;  Location: [facility];  Service: Urology;  Laterality: N/A;  . Ventral Hernia Repair with Mesh      FAMILY HISTORY: Family History  Problem Relation Age of Onset  . Adopted: Yes  . Heart failure Mother   . Heart failure Father     SOCIAL HISTORY:  Social History   Social History  . Marital status: Married    Spouse name: [name]  . Number of children: 2  . Years of education: 12   Occupational History  .      Chief Financial Officer, retired    Social History Main Topics  . Smoking status: Never Smoker  . Smokeless tobacco: Never Used  . Alcohol use No  . Drug use: No  . Sexual activity: Not Currently   Other Topics Concern  . Not on file   Social History Narrative   Lives at home with wife   Caffeine - Cokes, 1-2 weekly; coffee, 2-3 cups daily in winter     PHYSICAL EXAM  GENERAL EXAM/CONSTITUTIONAL: Vitals:  Vitals:   06/28/16 0954  BP: (!) 179/91  Pulse: 70  Weight: 179 lb 3.2 oz (81.3 kg)   Body mass index is 27.65 kg/m. No exam data present  Patient is in no distress; well developed, nourished and groomed; neck is supple  CARDIOVASCULAR:  Examination of carotid arteries is normal; no carotid bruits  Regular rate and rhythm, no murmurs  Examination of peripheral vascular system by observation and palpation is normal  EYES:  Ophthalmoscopic exam of optic discs and posterior segments is normal; no papilledema or hemorrhages  MUSCULOSKELETAL:  Gait, strength, tone, movements noted in Neurologic exam below  NEUROLOGIC: MENTAL STATUS:  No flowsheet data found.  awake, alert, oriented to person, place and time  recent and remote memory intact  normal attention and concentration  language fluent, comprehension intact, naming intact,   fund of knowledge appropriate  CRANIAL NERVE:   2nd - no papilledema on fundoscopic exam  2nd, 3rd, 4th, 6th - pupils equal and reactive to light, visual fields full to confrontation, extraocular muscles intact, no nystagmus  5th - facial sensation symmetric  7th - facial strength symmetric  8th - hearing intact  9th - palate elevates symmetrically, uvula midline  11th - shoulder shrug symmetric  12th - tongue protrusion midline  MOTOR:   normal bulk and tone, full strength in the BUE, BLE  SENSORY:   normal and symmetric to light touch  DECR VIB AT TOES (6-8 SECONDS)  COORDINATION:   finger-nose-finger, fine finger movements  normal  REFLEXES:   deep tendon reflexes --> ABSENT THROUGHOUT  GAIT/STATION:   narrow based gait; SMOOTH STRIDE AND TURNING; SLIGHTLY CAUTIOUS    DIAGNOSTIC DATA (LABS, IMAGING, TESTING) - I reviewed patient records, labs, notes, testing and imaging myself where available.  Lab Results  Component Value Date   WBC 8.9 05/02/2016   HGB 11.2 (L) 05/08/2016   HCT 33.0 (L) 05/08/2016   MCV 87.0 05/02/2016   PLT 262 05/02/2016      Component Value Date/Time   NA 140 05/08/2016 1238   K 4.1 05/08/2016 1238   CL 106 05/02/2016 0928   CO2 27 05/02/2016 0928   GLUCOSE 125 (H) 05/08/2016 1238   BUN 12 05/02/2016 0928   CREATININE 1.18 05/02/2016 0928   CALCIUM 9.5 05/02/2016 0928   PROT 7.4 03/24/2016 1226   ALBUMIN 3.8 06/20/2015 1632   AST 20 06/20/2015 1632   ALT 12 (L) 06/20/2015 1632   ALKPHOS 71 06/20/2015 1632   BILITOT 0.9 06/20/2015 1632   GFRNONAA 59 (L) 05/02/2016 0928   GFRAA >60 05/02/2016 0928   No results found for: CHOL, HDL, LDLCALC, LDLDIRECT, TRIG, CHOLHDL No results found for: HGBA1C Lab Results  Component Value Date   VITAMINB12 455 03/24/2016   Lab Results  Component Value Date   TSH 0.764 03/24/2016    08/16/12 EMG/NCS (Dr. Brien Few) - Significant peripheral sensorimotor polyneuropathy - Mild lumbar radiculitis  08/26/15 MRI LUMBAR [I reviewed images myself and agree with interpretation. -[facility]]  1. Chronic grade 1 L4-L5 spondylolisthesis with progression since 2012. Progressed and severe chronic disc degeneration. Acute and chronic endplate degeneration. Moderate to severe facet and posterior element degeneration. Subsequent L4-L5 multifactorial severe spinal, lateral recess, and right foraminal stenosis. 2. Chronic but less pronounced L5-S1 disc, endplate, and facet degeneration resulting in mild lateral recess and left foraminal stenosis. 3. Minimal to mild lumbar spine degeneration elsewhere.     ASSESSMENT AND PLAN  76 y.o. year old male  here with bilateral lower extremity numbness, pain, sensitivity, with low back pain radiating to the bilateral lower extremities. Patient has signs and symptoms of lumbar spinal stenosis with neurogenic claudication which has improved with decompression surgery on 05/08/16. He also may have superimposed polyneuropathy, which may be due to underlying rheumatoid arthritis, immunomodulatory medication and less likely MGUS.    Ddx: lumbar spinal stenosis + peripheral neuropathy (  hereditary vs acquired)  1. Spinal stenosis, lumbar region, with neurogenic claudication   2. 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    Penni Bombard, MD 07/14/5186, 41:66 AM Certified in Neurology, Neurophysiology and Neuroimaging  [facility] [address], [place], [place] [address] [phone]

## 2016-06-28 NOTE — Patient Instructions (Signed)
-   continue treatment of low back issues with Dr. Brien Few and Dr. 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 Felipa Eth

## 2016-07-10 DIAGNOSIS — M0579 Rheumatoid arthritis with rheumatoid factor of multiple sites without organ or systems involvement: Secondary | ICD-10-CM | POA: Diagnosis not present

## 2016-07-10 DIAGNOSIS — M2550 Pain in unspecified joint: Secondary | ICD-10-CM | POA: Diagnosis not present

## 2016-07-10 DIAGNOSIS — M810 Age-related osteoporosis without current pathological fracture: Secondary | ICD-10-CM | POA: Diagnosis not present

## 2016-07-10 DIAGNOSIS — Z6827 Body mass index (BMI) 27.0-27.9, adult: Secondary | ICD-10-CM | POA: Diagnosis not present

## 2016-07-10 DIAGNOSIS — Z79899 Other long term (current) drug therapy: Secondary | ICD-10-CM | POA: Diagnosis not present

## 2016-07-10 DIAGNOSIS — E663 Overweight: Secondary | ICD-10-CM | POA: Diagnosis not present

## 2016-07-16 DIAGNOSIS — I639 Cerebral infarction, unspecified: Secondary | ICD-10-CM

## 2016-07-16 HISTORY — DX: Cerebral infarction, unspecified: I63.9

## 2016-08-08 DIAGNOSIS — M4316 Spondylolisthesis, lumbar region: Secondary | ICD-10-CM | POA: Diagnosis not present

## 2016-08-09 ENCOUNTER — Inpatient Hospital Stay (HOSPITAL_COMMUNITY)
Admission: EM | Admit: 2016-08-09 | Discharge: 2016-08-11 | DRG: 065 | Disposition: A | Discharge status: Rehabilitation Facility | Payer: PPO | Attending: Family Medicine | Admitting: Family Medicine

## 2016-08-09 ENCOUNTER — Emergency Department (HOSPITAL_COMMUNITY): Payer: PPO

## 2016-08-09 DIAGNOSIS — M48061 Spinal stenosis, lumbar region without neurogenic claudication: Secondary | ICD-10-CM | POA: Diagnosis not present

## 2016-08-09 DIAGNOSIS — R29703 NIHSS score 3: Secondary | ICD-10-CM | POA: Diagnosis present

## 2016-08-09 DIAGNOSIS — Z9049 Acquired absence of other specified parts of digestive tract: Secondary | ICD-10-CM

## 2016-08-09 DIAGNOSIS — Z8719 Personal history of other diseases of the digestive system: Secondary | ICD-10-CM

## 2016-08-09 DIAGNOSIS — M810 Age-related osteoporosis without current pathological fracture: Secondary | ICD-10-CM | POA: Diagnosis not present

## 2016-08-09 DIAGNOSIS — I10 Essential (primary) hypertension: Secondary | ICD-10-CM | POA: Diagnosis not present

## 2016-08-09 DIAGNOSIS — K449 Diaphragmatic hernia without obstruction or gangrene: Secondary | ICD-10-CM | POA: Diagnosis not present

## 2016-08-09 DIAGNOSIS — Z96653 Presence of artificial knee joint, bilateral: Secondary | ICD-10-CM | POA: Diagnosis present

## 2016-08-09 DIAGNOSIS — R531 Weakness: Secondary | ICD-10-CM

## 2016-08-09 DIAGNOSIS — I63411 Cerebral infarction due to embolism of right middle cerebral artery: Principal | ICD-10-CM | POA: Diagnosis present

## 2016-08-09 DIAGNOSIS — I160 Hypertensive urgency: Secondary | ICD-10-CM | POA: Diagnosis not present

## 2016-08-09 DIAGNOSIS — G8194 Hemiplegia, unspecified affecting left nondominant side: Secondary | ICD-10-CM | POA: Diagnosis present

## 2016-08-09 DIAGNOSIS — K219 Gastro-esophageal reflux disease without esophagitis: Secondary | ICD-10-CM | POA: Diagnosis not present

## 2016-08-09 DIAGNOSIS — D509 Iron deficiency anemia, unspecified: Secondary | ICD-10-CM | POA: Diagnosis present

## 2016-08-09 DIAGNOSIS — R29818 Other symptoms and signs involving the nervous system: Secondary | ICD-10-CM | POA: Diagnosis not present

## 2016-08-09 DIAGNOSIS — Z7952 Long term (current) use of systemic steroids: Secondary | ICD-10-CM

## 2016-08-09 DIAGNOSIS — M4316 Spondylolisthesis, lumbar region: Secondary | ICD-10-CM | POA: Diagnosis not present

## 2016-08-09 DIAGNOSIS — I4891 Unspecified atrial fibrillation: Secondary | ICD-10-CM | POA: Diagnosis not present

## 2016-08-09 DIAGNOSIS — M069 Rheumatoid arthritis, unspecified: Secondary | ICD-10-CM | POA: Diagnosis not present

## 2016-08-09 DIAGNOSIS — R29810 Facial weakness: Secondary | ICD-10-CM | POA: Diagnosis not present

## 2016-08-09 DIAGNOSIS — I480 Paroxysmal atrial fibrillation: Secondary | ICD-10-CM | POA: Diagnosis not present

## 2016-08-09 DIAGNOSIS — Z8249 Family history of ischemic heart disease and other diseases of the circulatory system: Secondary | ICD-10-CM

## 2016-08-09 DIAGNOSIS — E785 Hyperlipidemia, unspecified: Secondary | ICD-10-CM | POA: Diagnosis not present

## 2016-08-09 DIAGNOSIS — I63511 Cerebral infarction due to unspecified occlusion or stenosis of right middle cerebral artery: Secondary | ICD-10-CM | POA: Diagnosis present

## 2016-08-09 DIAGNOSIS — Z8546 Personal history of malignant neoplasm of prostate: Secondary | ICD-10-CM | POA: Diagnosis not present

## 2016-08-09 DIAGNOSIS — M6289 Other specified disorders of muscle: Secondary | ICD-10-CM | POA: Diagnosis not present

## 2016-08-09 DIAGNOSIS — I6789 Other cerebrovascular disease: Secondary | ICD-10-CM | POA: Diagnosis not present

## 2016-08-09 DIAGNOSIS — M6281 Muscle weakness (generalized): Secondary | ICD-10-CM | POA: Diagnosis not present

## 2016-08-09 DIAGNOSIS — I639 Cerebral infarction, unspecified: Secondary | ICD-10-CM

## 2016-08-09 DIAGNOSIS — K5731 Diverticulosis of large intestine without perforation or abscess with bleeding: Secondary | ICD-10-CM | POA: Diagnosis present

## 2016-08-09 LAB — COMPREHENSIVE METABOLIC PANEL
ALBUMIN: 3.6 g/dL (ref 3.5–5.0)
ALT: 11 U/L — AB (ref 17–63)
AST: 19 U/L (ref 15–41)
Alkaline Phosphatase: 83 U/L (ref 38–126)
Anion gap: 6 (ref 5–15)
BUN: 14 mg/dL (ref 6–20)
CHLORIDE: 108 mmol/L (ref 101–111)
CO2: 24 mmol/L (ref 22–32)
CREATININE: 1.1 mg/dL (ref 0.61–1.24)
Calcium: 8.6 mg/dL — ABNORMAL LOW (ref 8.9–10.3)
GFR calc Af Amer: >60 mL/min (ref >60)
GFR calc non Af Amer: >60 mL/min (ref >60)
GLUCOSE: 104 mg/dL — AB (ref 65–99)
POTASSIUM: 3.5 mmol/L (ref 3.5–5.1)
Sodium: 138 mmol/L (ref 135–145)
Total Bilirubin: 0.5 mg/dL (ref 0.3–1.2)
Total Protein: 7 g/dL (ref 6.5–8.1)

## 2016-08-09 LAB — I-STAT CHEM 8, ED
BUN: 17 mg/dL (ref 6–20)
CHLORIDE: 106 mmol/L (ref 101–111)
Calcium, Ion: 0.92 mmol/L — ABNORMAL LOW (ref 1.15–1.40)
Creatinine, Ser: 1 mg/dL (ref 0.61–1.24)
Glucose, Bld: 101 mg/dL — ABNORMAL HIGH (ref 65–99)
HEMATOCRIT: 35 % — AB (ref 39.0–52.0)
Hemoglobin: 11.9 g/dL — ABNORMAL LOW (ref 13.0–17.0)
Potassium: 3.5 mmol/L (ref 3.5–5.1)
SODIUM: 140 mmol/L (ref 135–145)
TCO2: 24 mmol/L (ref 0–100)

## 2016-08-09 LAB — DIFFERENTIAL
BASOS ABS: 0.1 10*3/uL (ref 0.0–0.1)
Basophils Relative: 1 %
EOS ABS: 0.4 10*3/uL (ref 0.0–0.7)
Eosinophils Relative: 5 %
LYMPHS ABS: 3.3 10*3/uL (ref 0.7–4.0)
Lymphocytes Relative: 37 %
Monocytes Absolute: 1.3 10*3/uL — ABNORMAL HIGH (ref 0.1–1.0)
Monocytes Relative: 14 %
NEUTROS ABS: 3.8 10*3/uL (ref 1.7–7.7)
NEUTROS PCT: 43 %

## 2016-08-09 LAB — CBC
HEMATOCRIT: 35 % — AB (ref 39.0–52.0)
Hemoglobin: 10.2 g/dL — ABNORMAL LOW (ref 13.0–17.0)
MCH: 22.3 pg — ABNORMAL LOW (ref 26.0–34.0)
MCHC: 29.1 g/dL — AB (ref 30.0–36.0)
MCV: 76.4 fL — ABNORMAL LOW (ref 78.0–100.0)
Platelets: 264 10*3/uL (ref 150–400)
RBC: 4.58 MIL/uL (ref 4.22–5.81)
RDW: 16.5 % — ABNORMAL HIGH (ref 11.5–15.5)
WBC: 8.8 10*3/uL (ref 4.0–10.5)

## 2016-08-09 LAB — I-STAT TROPONIN, ED: Troponin i, poc: 0.01 ng/mL (ref 0.00–0.08)

## 2016-08-09 LAB — PROTIME-INR
INR: 1.08
Prothrombin Time: 14.1 seconds (ref 11.4–15.2)

## 2016-08-09 LAB — CBG MONITORING, ED: Glucose-Capillary: 93 mg/dL (ref 65–99)

## 2016-08-09 LAB — APTT: APTT: 28 s (ref 24–36)

## 2016-08-09 MED ORDER — IOPAMIDOL (ISOVUE-370) INJECTION 76%
INTRAVENOUS | Status: AC
  Administered 2016-08-09: 50 mL
  Filled 2016-08-09: qty 50

## 2016-08-09 NOTE — ED Provider Notes (Signed)
[facility] DEPT Provider Note   CSN: [id number] Arrival date & time: 08/09/16  2319  By signing my name below, I, Marcello Moores, attest that this documentation has been prepared under the direction and in the presence of Jola Schmidt, MD. Electronically Signed: Marcello Moores, ED Scribe. 08/10/16. 12:01 AM.   History   Chief Complaint Chief Complaint  Patient presents with  . Code Stroke    The history is provided by the patient. No language interpreter was used.    HPI Comments: [name] is a 77 y.o. male who presents to the Emergency Department complaining of sudden onset of left sided weakness with an onset tonight at 10:10pm. Pt was getting ready for bed and was reaching for something in his closet when he sneezed twice when he suddenly felt the weakness. Pt was able to safely lower himself to the ground. Pt arrives via EMS who report associated left sided facial droop. They note that the facial droop and the LUE weakness have improved at this time. Pt denies HA, CP, speech changes, and neck pain. He also denies a PMHx of stroke.symptoms are moderate and improving   Past Medical History:  Diagnosis Date  . Blood transfusion    " no reaction from transfusion"  . BPH (benign prostatic hyperplasia)   . Bruises easily   . Bursitis    chronic hip pain  . Cancer [facility])    prostate cancer cells  . Disc disease, degenerative, lumbar or lumbosacral    Chronic back pain now  . Diverticulitis   . Esophageal ring    requiring dilations   . GERD (gastroesophageal reflux disease)   . History of GI bleed    "vessels burst in colon" x2  . History of kidney stones    chronic  . History of vertigo   . Osteoarthritis   . Osteoporosis   . Pneumonia    yrs ago  . Rash    GROIN - ITCHING PAST 3 MONTHS - it is gone now 05/02/16  . Rheumatoid arthritis(714.0)    27 years  . Spondylisthesis   . Ureteral stone 11/2012    Patient Active Problem List   Diagnosis Date  Noted  . 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 Acute post-hemorrhagic anemia 02/03/2013  . Rheumatoid arthritis(714.0) 04/14/2011  . Long-term current use of steroids 04/14/2011  . Iron deficiency anemia 04/14/2011    Past Surgical History:  Procedure Laterality Date  . BALLOON DILATION N/A 10/01/2012   Procedure: BALLOON DILATION;  Surgeon: Garlan Fair, MD;  Location: [facility];  Service: Endoscopy;  Laterality: N/A;  . COLECTOMY N/A 06/10/2013   Procedure: TOTAL abdominal COLECTOMY;  Surgeon: Odis Hollingshead, MD;  Location: [facility];  Service: General;  Laterality: N/A;  . COLONOSCOPY    . CYSTOSCOPY WITH URETEROSCOPY Right 12/05/2012   Procedure: CYSTOSCOPY WITH RIGHT URETEROSCOPY AND STONE EXTRACTION;  Surgeon: Malka So, MD;  Location: [facility];  Service: Urology;  Laterality: Right;  . ESOPHAGOGASTRODUODENOSCOPY N/A 10/01/2012   Procedure: ESOPHAGOGASTRODUODENOSCOPY (EGD);  Surgeon: Garlan Fair, MD;  Location: [facility];  Service: Endoscopy;  Laterality: N/A;  . HERNIA REPAIR    . INGUINAL HERNIA REPAIR  2007   Bilateral   . Lithotripsy for nephrolithiasis     Pt and wife not sure when but here in [place]  . Lysis of Adhesion, small bowel resection  2009  .  MAXIMUM ACCESS (MAS)POSTERIOR LUMBAR INTERBODY FUSION (PLIF) 1 LEVEL N/A 05/08/2016   Procedure: LUMBAR FOUR-FIVE FOR MAXIMUM ACCESS (MAS) POSTERIOR LUMBAR INTERBODY FUSION;  Surgeon: Kary Kos, MD;  Location: [facility];  Service: Neurosurgery;  Laterality: N/A;  . NISSEN FUNDOPLICATION     about 2774, pt not sure records not currently available  . TOTAL KNEE ARTHROPLASTY  2003   Bilateral  . TRANSURETHRAL PROSTATECTOMY WITH GYRUS INSTRUMENTS N/A 12/05/2012   Procedure: TRANSURETHRAL PROSTATECTOMY WITH GYRUS INSTRUMENTS;  Surgeon: Malka So, MD;  Location: [facility];  Service: Urology;  Laterality: N/A;   . Ventral Hernia Repair with Mesh         Home Medications    Prior to Admission medications   Medication Sig Start Date End Date Taking? Authorizing Provider  acetaminophen (TYLENOL) 650 MG CR tablet Take 650-1,300 mg by mouth 2 (two) times daily. 1300 mg in the morning & 650 mg at night    Historical Provider, MD  leflunomide (ARAVA) 20 MG tablet Take 20 mg by mouth daily.  07/16/13   Historical Provider, MD  omeprazole (PRILOSEC) 20 MG capsule Take 20 mg by mouth daily.      Historical Provider, MD  predniSONE (DELTASONE) 5 MG tablet Take 5 mg by mouth daily.    Historical Provider, MD  tetrahydrozoline-zinc (VISINE-AC) 0.05-0.25 % ophthalmic solution Place 2 drops into both eyes 3 (three) times daily as needed (for allergies).    Historical Provider, MD  traMADol (ULTRAM) 50 MG tablet Take 50 mg by mouth 2 (two) times daily.    Historical Provider, MD    Family History Family History  Problem Relation Age of Onset  . Adopted: Yes  . Heart failure Mother   . Heart failure Father     Social History Social History  Substance Use Topics  . Smoking status: Never Smoker  . Smokeless tobacco: Never Used  . Alcohol use No     Allergies   Clindamycin/lincomycin; Enbrel [etanercept]; Penicillins; and Codeine   Review of Systems Review of Systems All systems reviewed and are negative for acute change except as noted in the HPI.  Physical Exam Updated Vital Signs BP (!) 191/89 (BP Location: Right Arm)   Pulse 73   Temp 98.3 F (36.8 C) (Oral)   Resp (!) 9   Ht 5' 7.5" (1.715 m)   Wt 182 lb 1.6 oz (82.6 kg)   SpO2 96%   BMI 28.10 kg/m   Physical Exam  Constitutional: He is oriented to person, place, and time. He appears well-developed and well-nourished.  HENT:  Head: Normocephalic and atraumatic.  Eyes: EOM are normal.  Neck: Normal range of motion.  Cardiovascular: Normal rate, regular rhythm, normal heart sounds and intact distal pulses.   Pulmonary/Chest:  Effort normal and breath sounds normal. No respiratory distress.  Abdominal: Soft. He exhibits no distension. There is no tenderness.  Musculoskeletal: Normal range of motion.  Neurological: He is alert and oriented to person, place, and time.  Mild left arm drift.  Normal grip strength bilaterally.  Mild left leg weakness as compared to right.  Speech normal.  Face symmetric.  Skin: Skin is warm and dry.  Psychiatric: He has a normal mood and affect. Judgment normal.  Nursing note and vitals reviewed.    ED Treatments / Results  DIAGNOSTIC STUDIES: Oxygen Saturation is 96% on RA, normal by my interpretation.   COORDINATION OF CARE: 11:10 PM-Discussed next steps with pt. Pt verbalized understanding and is agreeable with the  plan.    Labs (all labs ordered are listed, but only abnormal results are displayed) Labs Reviewed  CBC - Abnormal; Notable for the following:       Result Value   Hemoglobin 10.2 (*)    HCT 35.0 (*)    MCV 76.4 (*)    MCH 22.3 (*)    MCHC 29.1 (*)    RDW 16.5 (*)    All other components within normal limits  DIFFERENTIAL - Abnormal; Notable for the following:    Monocytes Absolute 1.3 (*)    All other components within normal limits  COMPREHENSIVE METABOLIC PANEL - Abnormal; Notable for the following:    Glucose, Bld 104 (*)    Calcium 8.6 (*)    ALT 11 (*)    All other components within normal limits  I-STAT CHEM 8, ED - Abnormal; Notable for the following:    Glucose, Bld 101 (*)    Calcium, Ion 0.92 (*)    Hemoglobin 11.9 (*)    HCT 35.0 (*)    All other components within normal limits  PROTIME-INR  APTT  I-STAT TROPOININ, ED  CBG MONITORING, ED    EKG  EKG Interpretation  Date/Time:  Wednesday August 09 2016 23:47:45 EDT Ventricular Rate:  83 PR Interval:    QRS Duration: 95 QT Interval:  386 QTC Calculation: 454 R Axis:   -20 Text Interpretation:  Sinus rhythm Multiple premature complexes, vent & supraven Borderline left axis  deviation No significant change was found Confirmed by Malaika Arnall  MD, Lennette Bihari ([id number]) on 08/10/2016 12:19:15 AM       Radiology Ct Angio Head W Or Wo Contrast  Result Date: 08/10/2016 CLINICAL DATA:  Left-sided weakness EXAM: CT ANGIOGRAPHY HEAD AND NECK TECHNIQUE: Multidetector CT imaging of the head and neck was performed using the standard protocol during bolus administration of intravenous contrast. Multiplanar CT image reconstructions and MIPs were obtained to evaluate the vascular anatomy. Carotid stenosis measurements (when applicable) are obtained utilizing NASCET criteria, using the distal internal carotid diameter as the denominator. CONTRAST:  50 mL Isovue 370 COMPARISON:  Head CT 08/09/2016 FINDINGS: CTA NECK FINDINGS Aortic arch: There is no aneurysm or dissection of the visualized ascending aorta or aortic arch. There is a normal 3 vessel branching pattern. The visualized proximal subclavian arteries are normal. Right carotid system: The right common carotid origin is widely patent. There is no common carotid or internal carotid artery dissection or aneurysm. No hemodynamically significant stenosis. Mild atherosclerotic calcification of the proximal internal carotid artery. Or Left carotid system: The left common carotid origin is widely patent. There is no common carotid or internal carotid artery dissection or aneurysm. No hemodynamically significant stenosis. Vertebral arteries: The vertebral system is codominant. There is severe narrowing of the left vertebral artery origin due to atherosclerotic calcification. Both vertebral arteries are otherwise normal to their confluence with the basilar artery. Skeleton: There is no bony spinal canal stenosis. No lytic or blastic lesions. Other neck: The nasopharynx is clear. The oropharynx and hypopharynx are normal. The epiglottis is normal. The supraglottic larynx, glottis and subglottic larynx are normal. No retropharyngeal collection. The parapharyngeal  spaces are preserved. The parotid and submandibular glands are normal. No sialolithiasis or salivary ductal dilatation. The thyroid gland is normal. There is no cervical lymphadenopathy. Upper chest: No pneumothorax or pleural effusion. No nodules or masses. Review of the MIP images confirms the above findings CTA HEAD FINDINGS Anterior circulation: --Intracranial internal carotid arteries: Normal. --Anterior cerebral arteries: Normal. --  Middle cerebral arteries: Normal. --Posterior communicating arteries: Diminutive bilaterally Posterior circulation: --Posterior cerebral arteries: Normal. --Superior cerebellar arteries: Normal. --Basilar artery: Normal. --Anterior inferior cerebellar arteries: Normal. --Posterior inferior cerebellar arteries: Normal. Venous sinuses: As permitted by contrast timing, patent. Anatomic variants: None Delayed phase: Not performed. Review of the MIP images confirms the above findings IMPRESSION: 1. Narrowing emergent intracranial large vessel occlusion. 2. 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 3. Otherwise, no hemodynamically significant stenosis of the major cervical arteries. Electronically Signed   By: Ulyses Jarred M.D.   On: 08/10/2016 00:04   Ct Angio Neck W And/or Wo Contrast  Result Date: 08/10/2016 CLINICAL DATA:  Left-sided weakness EXAM: CT ANGIOGRAPHY HEAD AND NECK TECHNIQUE: Multidetector CT imaging of the head and neck was performed using the standard protocol during bolus administration of intravenous contrast. Multiplanar CT image reconstructions and MIPs were obtained to evaluate the vascular anatomy. Carotid stenosis measurements (when applicable) are obtained utilizing NASCET criteria, using the distal internal carotid diameter as the denominator. CONTRAST:  50 mL Isovue 370 COMPARISON:  Head CT 08/09/2016 FINDINGS: CTA NECK FINDINGS Aortic arch: There is no aneurysm or dissection of the visualized ascending  aorta or aortic arch. There is a normal 3 vessel branching pattern. The visualized proximal subclavian arteries are normal. Right carotid system: The right common carotid origin is widely patent. There is no common carotid or internal carotid artery dissection or aneurysm. No hemodynamically significant stenosis. Mild atherosclerotic calcification of the proximal internal carotid artery. Or Left carotid system: The left common carotid origin is widely patent. There is no common carotid or internal carotid artery dissection or aneurysm. No hemodynamically significant stenosis. Vertebral arteries: The vertebral system is codominant. There is severe narrowing of the left vertebral artery origin due to atherosclerotic calcification. Both vertebral arteries are otherwise normal to their confluence with the basilar artery. Skeleton: There is no bony spinal canal stenosis. No lytic or blastic lesions. Other neck: The nasopharynx is clear. The oropharynx and hypopharynx are normal. The epiglottis is normal. The supraglottic larynx, glottis and subglottic larynx are normal. No retropharyngeal collection. The parapharyngeal spaces are preserved. The parotid and submandibular glands are normal. No sialolithiasis or salivary ductal dilatation. The thyroid gland is normal. There is no cervical lymphadenopathy. Upper chest: No pneumothorax or pleural effusion. No nodules or masses. Review of the MIP images confirms the above findings CTA HEAD FINDINGS Anterior circulation: --Intracranial internal carotid arteries: Normal. --Anterior cerebral arteries: Normal. --Middle cerebral arteries: Normal. --Posterior communicating arteries: Diminutive bilaterally Posterior circulation: --Posterior cerebral arteries: Normal. --Superior cerebellar arteries: Normal. --Basilar artery: Normal. --Anterior inferior cerebellar arteries: Normal. --Posterior inferior cerebellar arteries: Normal. Venous sinuses: As permitted by contrast timing,  patent. Anatomic variants: None Delayed phase: Not performed. Review of the MIP images confirms the above findings IMPRESSION: 1. Narrowing emergent intracranial large vessel occlusion. 2. 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 3. Otherwise, no hemodynamically significant stenosis of the major cervical arteries. Electronically Signed   By: Ulyses Jarred M.D.   On: 08/10/2016 00:04   Ct Head Code Stroke W/o Cm  Addendum Date: 08/10/2016   ADDENDUM REPORT: 08/10/2016 00:13 ADDENDUM: These results were called by telephone at the time of interpretation on 08/10/2016 at 11:58 p.m. to Dr. Kerney Elbe, who verbally acknowledged these results. Electronically Signed   By: Ulyses Jarred M.D.   On: 08/10/2016 00:13   Result Date: 08/10/2016 CLINICAL DATA:  Code stroke.  Left-sided  weakness EXAM: CT HEAD WITHOUT CONTRAST TECHNIQUE: Contiguous axial images were obtained from the base of the skull through the vertex without intravenous contrast. COMPARISON:  Head CT 05/14/2015 FINDINGS: Brain: No mass lesion, intraparenchymal hemorrhage or extra-axial collection. No evidence of acute cortical infarct. There is periventricular hypoattenuation compatible with chronic microvascular disease. Vascular: No hyperdense vessel or unexpected calcification. Skull: Normal visualized skull base, calvarium and extracranial soft tissues. Sinuses/Orbits: No sinus fluid levels or advanced mucosal thickening. No mastoid effusion. Normal orbits. ASPECTS [facility] Stroke Program Early CT Score) - Ganglionic level infarction (caudate, lentiform nuclei, internal capsule, insula, M1-M3 cortex): 7 - Supraganglionic infarction (M4-M6 cortex): 3 Total score (0-10 with 10 being normal): 10 IMPRESSION: 1. 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 2. ASPECTS is 10. Electronically Signed: By: Ulyses Jarred M.D. On: 08/09/2016 23:47    Procedures Procedures (including  critical care time)  Medications Ordered in ED Medications  iopamidol (ISOVUE-370) 76 % injection (50 mLs  Contrast Given 08/09/16 2337)     Initial Impression / Assessment and Plan / ED Course  I have reviewed the triage vital signs and the nursing notes.  Pertinent labs & imaging results that were available during my care of the patient were reviewed by me and considered in my medical decision making (see chart for details).     Patient with improving neurologic symptoms on arrival to the emergency department.  Patient presented as a code stroke.  He will not be a candidate for TPA given improving symptoms while here in the emergency department.  No evidence of vertebral or carotid dissection noted on CTA.  Patient be admitted for ongoing stroke workup.  Final Clinical Impressions(s) / ED Diagnoses   Final diagnoses:  Left-sided weakness    New Prescriptions New Prescriptions   No medications on file   I personally performed the services described in this documentation, which was scribed in my presence. The recorded information has been reviewed and is accurate.         Jola Schmidt, MD 08/10/16 [phone]

## 2016-08-09 NOTE — ED Triage Notes (Signed)
Pt from home via [facility]. Pt c/o L sided weakness that caused him to have to slide down to floor. Per EMS pt had a L sided facial droop, positive for drift on L arm and leg, and weakness on L side. LKW was 2210. Pt A&O x4 on arrival. Denies any pain @ this time.

## 2016-08-09 NOTE — Consult Note (Signed)
Referring Physician: Dr. Venora Maples    Chief Complaint: Acute onset of left sided weakness after sneezing  HPI: [name] is an 76 y.o. male who was eating dinner tonight when he suddenly sneezed and then became weak on his left side. The weakness involved the arm and leg; when first responder arrived, a left facial droop was also noted. The patient denies having sensory changes on the left. He also denies headache, neck pain, chest pain, abdominal pain or limb pain. He has not been confused and speech was not affected.   LSN: [id number] tPA Given: No: Based upon risk benefit profile, the patient elected not to undergo tPA administration  Past Medical History:  Diagnosis Date  . Blood transfusion    " no reaction from transfusion"  . BPH (benign prostatic hyperplasia)   . Bruises easily   . Bursitis    chronic hip pain  . Cancer [facility])    prostate cancer cells  . Disc disease, degenerative, lumbar or lumbosacral    Chronic back pain now  . Diverticulitis   . Esophageal ring    requiring dilations   . GERD (gastroesophageal reflux disease)   . History of GI bleed    "vessels burst in colon" x2  . History of kidney stones    chronic  . History of vertigo   . Osteoarthritis   . Osteoporosis   . Pneumonia    yrs ago  . Rash    GROIN - ITCHING PAST 3 MONTHS - it is gone now 05/02/16  . Rheumatoid arthritis(714.0)    27 years  . Spondylisthesis   . Ureteral stone 11/2012    Past Surgical History:  Procedure Laterality Date  . BALLOON DILATION N/A 10/01/2012   Procedure: BALLOON DILATION;  Surgeon: Garlan Fair, MD;  Location: [facility];  Service: Endoscopy;  Laterality: N/A;  . COLECTOMY N/A 06/10/2013   Procedure: TOTAL abdominal COLECTOMY;  Surgeon: Odis Hollingshead, MD;  Location: [facility];  Service: General;  Laterality: N/A;  . COLONOSCOPY    . CYSTOSCOPY WITH URETEROSCOPY Right 12/05/2012   Procedure: CYSTOSCOPY WITH RIGHT URETEROSCOPY AND STONE EXTRACTION;  Surgeon:  Malka So, MD;  Location: [facility];  Service: Urology;  Laterality: Right;  . ESOPHAGOGASTRODUODENOSCOPY N/A 10/01/2012   Procedure: ESOPHAGOGASTRODUODENOSCOPY (EGD);  Surgeon: Garlan Fair, MD;  Location: [facility];  Service: Endoscopy;  Laterality: N/A;  . HERNIA REPAIR    . INGUINAL HERNIA REPAIR  2007   Bilateral   . Lithotripsy for nephrolithiasis     Pt and wife not sure when but here in [place]  . Lysis of Adhesion, small bowel resection  2009  . MAXIMUM ACCESS (MAS)POSTERIOR LUMBAR INTERBODY FUSION (PLIF) 1 LEVEL N/A 05/08/2016   Procedure: LUMBAR FOUR-FIVE FOR MAXIMUM ACCESS (MAS) POSTERIOR LUMBAR INTERBODY FUSION;  Surgeon: Kary Kos, MD;  Location: [facility];  Service: Neurosurgery;  Laterality: N/A;  . NISSEN FUNDOPLICATION     about 4098, pt not sure records not currently available  . TOTAL KNEE ARTHROPLASTY  2003   Bilateral  . TRANSURETHRAL PROSTATECTOMY WITH GYRUS INSTRUMENTS N/A 12/05/2012   Procedure: TRANSURETHRAL PROSTATECTOMY WITH GYRUS INSTRUMENTS;  Surgeon: Malka So, MD;  [facility];  Service: Urology;  Laterality: N/A;  . Ventral Hernia Repair with Mesh      Family History  Problem Relation Age of Onset  . Adopted: Yes  . Heart failure Mother   . Heart failure Father    Social History:  reports  that he has never smoked. He has never used smokeless tobacco. He reports that he does not drink alcohol or use drugs.  Allergies:  Allergies  Allergen Reactions  . Clindamycin/Lincomycin Hives, Shortness Of Breath and Swelling  . Enbrel [Etanercept] Swelling    SEVERE LEG SWELLING   . Penicillins Hives and Shortness Of Breath    Has patient had a PCN reaction causing immediate rash, facial/tongue/throat swelling, SOB or lightheadedness with hypotension: Yes Has patient had a PCN reaction causing severe rash involving mucus membranes or skin necrosis: Yes Has patient had a PCN reaction that required hospitalization No Has patient had a PCN reaction  occurring within the last 10 years: No If all of the above answers are "NO", then may proceed with Cephalosporin use.   . Codeine Hives and Swelling    Medications:  acetaminophen (TYLENOL) 650 MG CR tablet Take 650-1,300 mg by mouth 2 (two) times daily. 1300 mg in the morning & 650 mg at night Historical Provider, MD Needs Review  leflunomide (ARAVA) 20 MG tablet Take 20 mg by mouth daily.  Historical Provider, MD Needs Review  omeprazole (PRILOSEC) 20 MG capsule Take 20 mg by mouth daily.  Historical Provider, MD Needs Review  predniSONE (DELTASONE) 5 MG tablet Take 5 mg by mouth daily. Historical Provider, MD Needs Review  tetrahydrozoline-zinc (VISINE-AC) 0.05-0.25 % ophthalmic solution Place 2 drops into both eyes 3 (three) times daily as needed (for allergies). Historical Provider, MD Needs Review  traMADol (ULTRAM) 50 MG tablet Take 50 mg by mouth 2 (two) times daily. Historical Provider, MD Needs Review   ROS: As per HPI.   Physical Examination: Height 5' 7.5" (1.715 m), weight 82.6 kg (182 lb 1.6 oz).  HEENT: [place]/AT Lungs: Respirations unlabored Ext: Warm and well perfused  Neurologic Examination: Mental Status: Alert, oriented, thought content appropriate.  Speech fluent without evidence of aphasia.  Able to follow all commands without difficulty. Cranial Nerves: II:  Visual fields intact, PERRL  III,IV, VI: ptosis not present, EOMI without nystagmus V,VII: smile symmetric, facial temp sensation normal bilaterally VIII: hearing intact to questions and commands IX,X: No hypophonia or hoarseness XI: bilateral shoulder shrug intact XII: midline tongue extension  Motor: RUE and RLE: 5/5 LUE 3-4/5 on separate trials proximally and distally LLE: 3-4/5 on separate trials proximally and distally, except for foot dorsiflexion which is 5/5 Sensory: Decreased temperature and FT sensation to LUE and LLE proximally. Bilateral lower extremities with hyperalgesia and decreased temp/FT  sensation below knees Deep Tendon Reflexes:  Right brachioradialis and biceps 2+ Left brachioradialis and biceps 1+ 0 patellae and achilles bilaterally Plantars: Mute bilaterally Cerebellar: No ataxia with FNF bilaterally Gait: Deferred due to acuity  NIHSS: 3  Results for orders placed or performed during the hospital encounter of 08/09/16 (from the past 48 hour(s))  CBG monitoring, ED     Status: None   Collection Time: 08/09/16 11:26 PM  Result Value Ref Range   Glucose-Capillary 93 65 - 99 mg/dL   No results found.  Assessment: 76 y.o. male with acute onset of left upper and lower extremity weakness 1. Overall symptoms and exam findings are most consistent with an acute right MCA stroke. NIHSS of 3 with rapidly improving motor symptoms. Based upon the risk benefit profile described to him by me in depth, the patient elected not to undergo tPA administration.  2. CT head reveals chronic microvascular ischemia without acute intracranial abnormality. 3. CTA head and neck reveals no emergent intracranial large vessel  occlusion. There is severe narrowing of the left vertebral artery origin due to atherosclerotic calcification. No dissection noted.  4. History of GI bleed.   Plan: 1. HgbA1c, fasting lipid panel 2. MRI of the brain without contrast 3. PT consult, OT consult, Speech consult 4. Echocardiogram 5. ASA 325 mg po qd 6. Obtain GI consult re: risk of recurrent GIB while on ASA. 7. Start atorvastatin 40 mg po qd. Obtain baseline CK level.  8. Permissive HTN x 24 hours 9. Telemetry monitoring 10. Frequent neuro checks  @Electronically  signed: Dr. Kerney Elbe  08/09/2016, 11:29 PM

## 2016-08-10 ENCOUNTER — Encounter (HOSPITAL_COMMUNITY): Payer: Self-pay | Admitting: Family Medicine

## 2016-08-10 ENCOUNTER — Observation Stay (HOSPITAL_COMMUNITY): Payer: PPO

## 2016-08-10 DIAGNOSIS — I639 Cerebral infarction, unspecified: Secondary | ICD-10-CM | POA: Diagnosis not present

## 2016-08-10 DIAGNOSIS — I69398 Other sequelae of cerebral infarction: Secondary | ICD-10-CM | POA: Diagnosis not present

## 2016-08-10 DIAGNOSIS — Z79899 Other long term (current) drug therapy: Secondary | ICD-10-CM | POA: Diagnosis not present

## 2016-08-10 DIAGNOSIS — Z9079 Acquired absence of other genital organ(s): Secondary | ICD-10-CM | POA: Diagnosis not present

## 2016-08-10 DIAGNOSIS — R2689 Other abnormalities of gait and mobility: Secondary | ICD-10-CM | POA: Diagnosis not present

## 2016-08-10 DIAGNOSIS — Z981 Arthrodesis status: Secondary | ICD-10-CM | POA: Diagnosis not present

## 2016-08-10 DIAGNOSIS — M48061 Spinal stenosis, lumbar region without neurogenic claudication: Secondary | ICD-10-CM | POA: Diagnosis not present

## 2016-08-10 DIAGNOSIS — K912 Postsurgical malabsorption, not elsewhere classified: Secondary | ICD-10-CM | POA: Diagnosis not present

## 2016-08-10 DIAGNOSIS — I4891 Unspecified atrial fibrillation: Secondary | ICD-10-CM | POA: Diagnosis not present

## 2016-08-10 DIAGNOSIS — I63511 Cerebral infarction due to unspecified occlusion or stenosis of right middle cerebral artery: Secondary | ICD-10-CM | POA: Diagnosis not present

## 2016-08-10 DIAGNOSIS — M05711 Rheumatoid arthritis with rheumatoid factor of right shoulder without organ or systems involvement: Secondary | ICD-10-CM | POA: Diagnosis not present

## 2016-08-10 DIAGNOSIS — I6789 Other cerebrovascular disease: Secondary | ICD-10-CM | POA: Diagnosis not present

## 2016-08-10 DIAGNOSIS — R531 Weakness: Secondary | ICD-10-CM | POA: Diagnosis not present

## 2016-08-10 DIAGNOSIS — I480 Paroxysmal atrial fibrillation: Secondary | ICD-10-CM | POA: Diagnosis not present

## 2016-08-10 DIAGNOSIS — Z8719 Personal history of other diseases of the digestive system: Secondary | ICD-10-CM

## 2016-08-10 DIAGNOSIS — M810 Age-related osteoporosis without current pathological fracture: Secondary | ICD-10-CM | POA: Diagnosis not present

## 2016-08-10 DIAGNOSIS — M6289 Other specified disorders of muscle: Secondary | ICD-10-CM | POA: Diagnosis not present

## 2016-08-10 DIAGNOSIS — Z96653 Presence of artificial knee joint, bilateral: Secondary | ICD-10-CM | POA: Diagnosis not present

## 2016-08-10 DIAGNOSIS — K219 Gastro-esophageal reflux disease without esophagitis: Secondary | ICD-10-CM | POA: Diagnosis not present

## 2016-08-10 DIAGNOSIS — I160 Hypertensive urgency: Secondary | ICD-10-CM | POA: Diagnosis not present

## 2016-08-10 DIAGNOSIS — M4316 Spondylolisthesis, lumbar region: Secondary | ICD-10-CM

## 2016-08-10 DIAGNOSIS — K449 Diaphragmatic hernia without obstruction or gangrene: Secondary | ICD-10-CM | POA: Diagnosis not present

## 2016-08-10 DIAGNOSIS — Z8546 Personal history of malignant neoplasm of prostate: Secondary | ICD-10-CM | POA: Diagnosis not present

## 2016-08-10 DIAGNOSIS — D509 Iron deficiency anemia, unspecified: Secondary | ICD-10-CM

## 2016-08-10 DIAGNOSIS — R269 Unspecified abnormalities of gait and mobility: Secondary | ICD-10-CM | POA: Diagnosis not present

## 2016-08-10 DIAGNOSIS — G8194 Hemiplegia, unspecified affecting left nondominant side: Secondary | ICD-10-CM | POA: Diagnosis not present

## 2016-08-10 DIAGNOSIS — I63512 Cerebral infarction due to unspecified occlusion or stenosis of left middle cerebral artery: Secondary | ICD-10-CM | POA: Diagnosis not present

## 2016-08-10 DIAGNOSIS — R29703 NIHSS score 3: Secondary | ICD-10-CM | POA: Diagnosis not present

## 2016-08-10 DIAGNOSIS — R29810 Facial weakness: Secondary | ICD-10-CM | POA: Diagnosis not present

## 2016-08-10 DIAGNOSIS — M069 Rheumatoid arthritis, unspecified: Secondary | ICD-10-CM

## 2016-08-10 DIAGNOSIS — M05712 Rheumatoid arthritis with rheumatoid factor of left shoulder without organ or systems involvement: Secondary | ICD-10-CM | POA: Diagnosis not present

## 2016-08-10 DIAGNOSIS — I10 Essential (primary) hypertension: Secondary | ICD-10-CM | POA: Diagnosis not present

## 2016-08-10 DIAGNOSIS — I63411 Cerebral infarction due to embolism of right middle cerebral artery: Secondary | ICD-10-CM | POA: Diagnosis not present

## 2016-08-10 DIAGNOSIS — N400 Enlarged prostate without lower urinary tract symptoms: Secondary | ICD-10-CM | POA: Diagnosis not present

## 2016-08-10 DIAGNOSIS — E785 Hyperlipidemia, unspecified: Secondary | ICD-10-CM | POA: Diagnosis not present

## 2016-08-10 DIAGNOSIS — Z9049 Acquired absence of other specified parts of digestive tract: Secondary | ICD-10-CM | POA: Diagnosis not present

## 2016-08-10 DIAGNOSIS — Z8249 Family history of ischemic heart disease and other diseases of the circulatory system: Secondary | ICD-10-CM | POA: Diagnosis not present

## 2016-08-10 DIAGNOSIS — E8809 Other disorders of plasma-protein metabolism, not elsewhere classified: Secondary | ICD-10-CM | POA: Diagnosis not present

## 2016-08-10 DIAGNOSIS — I69354 Hemiplegia and hemiparesis following cerebral infarction affecting left non-dominant side: Secondary | ICD-10-CM | POA: Diagnosis not present

## 2016-08-10 DIAGNOSIS — G8191 Hemiplegia, unspecified affecting right dominant side: Secondary | ICD-10-CM | POA: Diagnosis not present

## 2016-08-10 DIAGNOSIS — Z7952 Long term (current) use of systemic steroids: Secondary | ICD-10-CM | POA: Diagnosis not present

## 2016-08-10 DIAGNOSIS — Z885 Allergy status to narcotic agent status: Secondary | ICD-10-CM | POA: Diagnosis not present

## 2016-08-10 DIAGNOSIS — Z881 Allergy status to other antibiotic agents status: Secondary | ICD-10-CM | POA: Diagnosis not present

## 2016-08-10 DIAGNOSIS — Z880 Allergy status to penicillin: Secondary | ICD-10-CM | POA: Diagnosis not present

## 2016-08-10 LAB — LIPID PANEL
CHOLESTEROL: 140 mg/dL (ref 0–200)
HDL: 48 mg/dL (ref >40)
LDL Cholesterol: 70 mg/dL (ref 0–99)
TRIGLYCERIDES: 109 mg/dL (ref <150)
Total CHOL/HDL Ratio: 2.9 RATIO
VLDL: 22 mg/dL (ref 0–40)

## 2016-08-10 MED ORDER — ASPIRIN 300 MG RE SUPP: 300.0000 mg | Freq: Every day | RECTAL | Status: DC

## 2016-08-10 MED ORDER — SENNOSIDES-DOCUSATE SODIUM 8.6-50 MG PO TABS
1.0000 | ORAL_TABLET | Freq: Every evening | ORAL | Status: DC | PRN
  Filled 2016-08-10: qty 1

## 2016-08-10 MED ORDER — ACETAMINOPHEN 160 MG/5ML PO SOLN: 650.0000 mg | ORAL | Status: DC | PRN

## 2016-08-10 MED ORDER — ASPIRIN 325 MG PO TABS
325.0000 mg | ORAL_TABLET | Freq: Every day | ORAL | Status: DC
  Administered 2016-08-10 – 2016-08-11 (×2): 325 mg via ORAL
  Filled 2016-08-10 (×2): qty 1

## 2016-08-10 MED ORDER — TRAMADOL HCL 50 MG PO TABS: 50.0000 mg | ORAL_TABLET | Freq: Four times a day (QID) | ORAL | Status: DC | PRN

## 2016-08-10 MED ORDER — STROKE: EARLY STAGES OF RECOVERY BOOK
Freq: Once | Status: AC
  Administered 2016-08-10: 1
  Filled 2016-08-10: qty 1

## 2016-08-10 MED ORDER — SODIUM CHLORIDE 0.9 % IV SOLN
INTRAVENOUS | Status: DC
  Administered 2016-08-10: 04:00:00 via INTRAVENOUS

## 2016-08-10 MED ORDER — ACETAMINOPHEN 650 MG RE SUPP: 650.0000 mg | RECTAL | Status: DC | PRN

## 2016-08-10 MED ORDER — NAPHAZOLINE-GLYCERIN 0.012-0.2 % OP SOLN
2.0000 [drp] | Freq: Four times a day (QID) | OPHTHALMIC | Status: DC | PRN
Start: 2016-08-10 — End: 2016-08-11

## 2016-08-10 MED ORDER — ACETAMINOPHEN ER 650 MG PO TBCR: 650.0000 mg | EXTENDED_RELEASE_TABLET | Freq: Four times a day (QID) | ORAL | Status: DC | PRN

## 2016-08-10 MED ORDER — LEFLUNOMIDE 20 MG PO TABS
20.0000 mg | ORAL_TABLET | Freq: Every day | ORAL | Status: DC
  Administered 2016-08-10 – 2016-08-11 (×2): 20 mg via ORAL
  Filled 2016-08-10 (×2): qty 1

## 2016-08-10 MED ORDER — PREDNISONE 5 MG PO TABS
5.0000 mg | ORAL_TABLET | Freq: Every day | ORAL | Status: DC
  Administered 2016-08-10 – 2016-08-11 (×2): 5 mg via ORAL
  Filled 2016-08-10 (×2): qty 1

## 2016-08-10 MED ORDER — ACETAMINOPHEN 325 MG PO TABS: 650.0000 mg | ORAL_TABLET | ORAL | Status: DC | PRN

## 2016-08-10 MED ORDER — ATORVASTATIN CALCIUM 40 MG PO TABS
40.0000 mg | ORAL_TABLET | Freq: Every day | ORAL | Status: DC
  Administered 2016-08-10 – 2016-08-11 (×2): 40 mg via ORAL
  Filled 2016-08-10 (×2): qty 1

## 2016-08-10 MED ORDER — PANTOPRAZOLE SODIUM 40 MG PO TBEC
40.0000 mg | DELAYED_RELEASE_TABLET | Freq: Every day | ORAL | Status: DC
  Administered 2016-08-10 – 2016-08-11 (×2): 40 mg via ORAL
  Filled 2016-08-10 (×2): qty 1

## 2016-08-10 MED ORDER — ENOXAPARIN SODIUM 40 MG/0.4ML ~~LOC~~ SOLN
40.0000 mg | SUBCUTANEOUS | Status: DC
  Administered 2016-08-10 – 2016-08-11 (×2): 40 mg via SUBCUTANEOUS
  Filled 2016-08-10 (×2): qty 0.4

## 2016-08-10 MED ORDER — LABETALOL HCL 5 MG/ML IV SOLN: 5.0000 mg | INTRAVENOUS | Status: DC | PRN

## 2016-08-10 NOTE — Evaluation (Signed)
Speech Language Pathology Evaluation Patient Details Name: [name] MRN: [id number] DOB: [birth year] Today's Date: 08/10/2016 Time: 1354-1410 SLP Time Calculation (min) (ACUTE ONLY): 16 min  Problem List:  Patient Active Problem List   Diagnosis Date Noted  . Acute ischemic stroke ([place]) 08/10/2016  . Hypertensive urgency 08/10/2016  . CVA (cerebral vascular accident) ([place]) 08/10/2016  . Acute left-sided weakness   . Left-sided weakness   . History of lower GI bleeding   . Hiatal hernia   . 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 Acute post-hemorrhagic anemia 02/03/2013  . Rheumatoid arthritis ([place]) 04/14/2011  . Long-term current use of steroids 04/14/2011  . Iron deficiency anemia 04/14/2011   Past Medical History:  Past Medical History:  Diagnosis Date  . Blood transfusion    " no reaction from transfusion"  . BPH (benign prostatic hyperplasia)   . Bruises easily   . Bursitis    chronic hip pain  . Cancer [facility])    prostate cancer cells  . Disc disease, degenerative, lumbar or lumbosacral    Chronic back pain now  . Diverticulitis   . Esophageal ring    requiring dilations   . GERD (gastroesophageal reflux disease)   . History of GI bleed    "vessels burst in colon" x2  . History of kidney stones    chronic  . History of vertigo   . Osteoarthritis   . Osteoporosis   . Pneumonia    yrs ago  . Rash    GROIN - ITCHING PAST 3 MONTHS - it is gone now 05/02/16  . Rheumatoid arthritis(714.0)    27 years  . Spondylisthesis   . Ureteral stone 11/2012   Past Surgical History:  Past Surgical History:  Procedure Laterality Date  . BALLOON DILATION N/A 10/01/2012   Procedure: BALLOON DILATION;  Surgeon: Garlan Fair, MD;  Location: [facility];  Service: Endoscopy;  Laterality: N/A;  . COLECTOMY N/A 06/10/2013   Procedure: TOTAL abdominal COLECTOMY;   Surgeon: Odis Hollingshead, MD;  [facility];  Service: General;  Laterality: N/A;  . COLONOSCOPY    . CYSTOSCOPY WITH URETEROSCOPY Right 12/05/2012   Procedure: CYSTOSCOPY WITH RIGHT URETEROSCOPY AND STONE EXTRACTION;  Surgeon: Malka So, MD;  [facility];  Service: Urology;  Laterality: Right;  . ESOPHAGOGASTRODUODENOSCOPY N/A 10/01/2012   Procedure: ESOPHAGOGASTRODUODENOSCOPY (EGD);  Surgeon: Garlan Fair, MD;  Location: [facility];  Service: Endoscopy;  Laterality: N/A;  . HERNIA REPAIR    . INGUINAL HERNIA REPAIR  2007   Bilateral   . Lithotripsy for nephrolithiasis     Pt and wife not sure when but here in [place]  . Lysis of Adhesion, small bowel resection  2009  . MAXIMUM ACCESS (MAS)POSTERIOR LUMBAR INTERBODY FUSION (PLIF) 1 LEVEL N/A 05/08/2016   Procedure: LUMBAR FOUR-FIVE FOR MAXIMUM ACCESS (MAS) POSTERIOR LUMBAR INTERBODY FUSION;  Surgeon: Kary Kos, MD;  Location: [facility];  Service: Neurosurgery;  Laterality: N/A;  . NISSEN FUNDOPLICATION     about 8119, pt not sure records not currently available  . TOTAL KNEE ARTHROPLASTY  2003   Bilateral  . TRANSURETHRAL PROSTATECTOMY WITH GYRUS INSTRUMENTS N/A 12/05/2012   Procedure: TRANSURETHRAL PROSTATECTOMY WITH GYRUS INSTRUMENTS;  Surgeon: Malka So, MD;  [facility];  Service: Urology;  Laterality: N/A;  . Ventral Hernia Repair with Mesh     HPI:  Pt is a 76  y/o male admitted from home secondary to acute L sided weakness in which he fell to the floor. In ED, head CT revealed chronic microvascular ischemia. Neurology has been consulted. PMH prostate cancer, GERD, esophageal stricture, pna, RA and hx of bilateral TKA in 2003. MRI Acute ischemia of the right caudate tail extending to the posterior limb of the right internal capsule, in keeping with acute left-sided weakness. 2. Punctate foci of acute ischemia within the left parietal lobe and at the right temporal occipital junction. The distribution of lesions  suggests a central cardioembolic process.   Assessment / Plan / Recommendation Clinical Impression  Pt scored within normal limits on all subtests of the Cognistat except for memory (scored in mild-mod impairment range). He was however able to recall recent tests/procedures etc. Significant left labial weakness and decreased ROM. Speech and language are functional. No ST recommended on acute care. PT/OT recommended CIR who can reassess and address if needed.     SLP Assessment  SLP Recommendation/Assessment: Patient does not need any further Speech Lanaguage Pathology Services SLP Visit Diagnosis: Cognitive communication deficit (R41.841)    Follow Up Recommendations  None    Frequency and Duration           SLP Evaluation Cognition  Overall Cognitive Status: Impaired/Different from baseline Arousal/Alertness: Awake/alert Orientation Level: Oriented X4 Attention: Sustained Sustained Attention: Appears intact Memory: Impaired Memory Impairment: Storage deficit;Retrieval deficit Awareness: Appears intact Problem Solving: Appears intact (for verbal) Safety/Judgment: Appears intact       Comprehension  Auditory Comprehension Overall Auditory Comprehension: Appears within functional limits for tasks assessed Visual Recognition/Discrimination Discrimination: Not tested Reading Comprehension Reading Status: Not tested    Expression Expression Primary Mode of Expression: Verbal Verbal Expression Overall Verbal Expression: Appears within functional limits for tasks assessed Repetition: No impairment Naming: No impairment Pragmatics: No impairment Written Expression Dominant Hand: Right Written Expression: Not tested   Oral / Motor  Oral Motor/Sensory Function Overall Oral Motor/Sensory Function: Moderate impairment Facial ROM: Reduced left;Suspected CN VII (facial) dysfunction Facial Symmetry: Abnormal symmetry left;Suspected CN VII (facial) dysfunction Facial Strength:  Reduced left;Suspected CN VII (facial) dysfunction Facial Sensation: Reduced left;Suspected CN V (Trigeminal) dysfunction Motor Speech Overall Motor Speech: Appears within functional limits for tasks assessed Intelligibility: Intelligible Motor Planning: Witnin functional limits   GO                    Houston Siren 08/10/2016, 2:30 PM  Orbie Pyo Colvin Caroli.Ed [employer] [phone]

## 2016-08-10 NOTE — Progress Notes (Signed)
Patient  Is floor

## 2016-08-10 NOTE — Progress Notes (Signed)
Patient admitted after midnight, please see H&P.  Place in pt due to +CVA and further work up needed.  PT eval recommends CIR.  Eulogio Bear DO

## 2016-08-10 NOTE — Evaluation (Signed)
Physical Therapy Evaluation Patient Details Name: [name] MRN: [id number] DOB: [birth year] Today's Date: 08/10/2016   History of Present Illness  Pt is a 76 y/o male admitted from home secondary to acute L sided weakness in which he fell to the floor. In ED, head CT revealed chronic microvascular ischemia. Neurology has been consulted. PMH including but not limited to prostate cancer, RA and hx of bilateral TKA in 2003.  Clinical Impression  Pt presented sitting OOB in recliner chair, awake and willing to participate in therapy session. Pt's wife was present throughout evaluation as well. Prior to admission, pt was very active and independent with all functional mobility. His home has a level entry, but his bedroom and bathroom are upstairs and he must ascend/descend 14 steps to get there. Pt currently requires max A to perform sit-to-stand and to maintain standing as pt presents with significant L lateral lean. Pt would continue to benefit from skilled physical therapy services at this time while admitted and after d/c to address the below listed limitations in order to improve overall safety and independence with functional mobility.      Follow Up Recommendations CIR;Supervision/Assistance - 24 hour    Equipment Recommendations  None recommended by PT;Other (comment) (defer to next venue)    Recommendations for Other Services       Precautions / Restrictions Precautions Precautions: Fall Restrictions Weight Bearing Restrictions: No      Mobility  Bed Mobility               General bed mobility comments: pt sitting OOB in recliner chair when therapist entered room.   Transfers Overall transfer level: Needs assistance Equipment used: 1 person hand held assist Transfers: Sit to/from Stand Sit to Stand: Max assist         General transfer comment: increased time, max A to rise into standing and to maintain standing as pt with heavy L sided lean. pt performed  sit-to-stand x2 from recliner  Ambulation/Gait                Stairs            Wheelchair Mobility    Modified Rankin (Stroke Patients Only) Modified Rankin (Stroke Patients Only) Pre-Morbid Rankin Score: No symptoms Modified Rankin: Moderately severe disability     Balance Overall balance assessment: Needs assistance Sitting-balance support: Feet supported Sitting balance-Leahy Scale: Poor Sitting balance - Comments: pt requiring assistance and support of recliner chair Postural control: Left lateral lean Standing balance support: During functional activity;Single extremity supported Standing balance-Leahy Scale: Poor Standing balance comment: max A to maintain standing, pt with heavy L lateral lean                             Pertinent Vitals/Pain Pain Assessment: No/denies pain    Home Living Family/patient expects to be discharged to:: Private residence Living Arrangements: Spouse/significant other Available Help at Discharge: Family;Available 24 hours/day Type of Home: House Home Access: Level entry     Home Layout: Two level;Bed/bath upstairs Home Equipment: Cane - single point      Prior Function Level of Independence: Independent         Comments: Drives, cleans. Active.     Hand Dominance   Dominant Hand: Right    Extremity/Trunk Assessment   Upper Extremity Assessment Upper Extremity Assessment: Defer to OT evaluation;LUE deficits/detail LUE Deficits / Details: Dysmetria with finger-to-nose. MMT revealed 3+/5 for shoulder flexion,  shoulder abduction and elbow flexion. Sensation grossly intact.    Lower Extremity Assessment Lower Extremity Assessment: LLE deficits/detail LLE Deficits / Details: MMT revealed 3+/5 for hip flexion, hip abduction and hip adduction; 4/5 for knee flexion, knee extension and ankle DF. Sensation grossly intact.    Cervical / Trunk Assessment Cervical / Trunk Assessment: Other  exceptions Cervical / Trunk Exceptions: pt had spinal sx (L4-5 PLIF) in January 2018  Communication   Communication: No difficulties  Cognition Arousal/Alertness: Awake/alert Behavior During Therapy: WFL for tasks assessed/performed Overall Cognitive Status: Impaired/Different from baseline Area of Impairment: Safety/judgement                         Safety/Judgement: Decreased awareness of safety;Decreased awareness of deficits            General Comments      Exercises     Assessment/Plan    PT Assessment Patient needs continued PT services  PT Problem List Decreased strength;Decreased activity tolerance;Decreased balance;Decreased mobility;Decreased coordination;Decreased knowledge of use of DME;Decreased safety awareness       PT Treatment Interventions DME instruction;Gait training;Stair training;Functional mobility training;Therapeutic activities;Therapeutic exercise;Balance training;Neuromuscular re-education;Patient/family education    PT Goals (Current goals can be found in the Care Plan section)  Acute Rehab PT Goals Patient Stated Goal: to return to independence PT Goal Formulation: With patient/family Time For Goal Achievement: 08/24/16 Potential to Achieve Goals: Good    Frequency Min 4X/week   Barriers to discharge        Co-evaluation               End of Session Equipment Utilized During Treatment: Gait belt Activity Tolerance: Patient tolerated treatment well Patient left: in chair;with call bell/phone within reach;with chair alarm set;with family/visitor present Nurse Communication: Mobility status;Need for lift equipment PT Visit Diagnosis: Other abnormalities of gait and mobility (R26.89);Other symptoms and signs involving the nervous system (R29.898)    Time: 2202-5427 PT Time Calculation (min) (ACUTE ONLY): 18 min   Charges:   PT Evaluation $PT Eval Moderate Complexity: 1 Procedure     PT G Codes:   PT G-Codes **NOT  FOR INPATIENT CLASS** Functional Assessment Tool Used: Clinical judgement;AM-PAC 6 Clicks Basic Mobility Functional Limitation: Mobility: Walking and moving around Mobility: Walking and Moving Around Current Status (C6237): 100 percent impaired, limited or restricted Mobility: Walking and Moving Around Goal Status (S2831): At least 20 percent but less than 40 percent impaired, limited or restricted    [facility], [place], DPT [place] 08/10/2016, 10:50 AM

## 2016-08-10 NOTE — Care Management Note (Signed)
Case Management Note  Patient Details  Name: [name] MRN: [id number] Date of Birth: 20-Aug-1940  Subjective/Objective:   Pt admitted with CVA. He is from home with his spouse.                  Action/Plan: PT/OT recommending CIR. CM following for d/c disposition.   Expected Discharge Date:  08/11/16               Expected Discharge Plan:  [place]  In-House Referral:     Discharge planning Services     Post Acute Care Choice:    Choice offered to:     DME Arranged:    DME Agency:     HH Arranged:    HH Agency:     Status of Service:  In process, will continue to follow  If discussed at Long Length of Stay Meetings, dates discussed:    Additional Comments:  Pollie Friar, RN 08/10/2016, 1:44 PM

## 2016-08-10 NOTE — Consult Note (Signed)
Physical Medicine and Rehabilitation Consult Reason for Consult: Left-sided weakness Referring Physician: [facility]   HPI: [name] is a 76 y.o. 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 Per chart review patient lives with spouse independent prior to admission . Two-level home with bedroom upstairs. Presented 08/10/2016 with left-sided weakness of acute onset resulting in a fall with no loss of consciousness. Blood pressure mildly elevated 170/100. CT/MRI showed acute ischemia of the right caudate tail extending to the posterior limb of the right internal capsule. Punctate foci of acute ischemia within the left parietal lobe and at the right temporal occipital junction. MRA was unremarkable. Patient did not receive TPA. CT angiogram head and neck showed narrowing emergent intracranial large vessel occlusion. 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 Echocardiogram is pending. Venous Dopplers lower extremities negative for DVT. Neurology consulted currently on aspirin for CVA prophylaxis. Subcutaneous Lovenox for DVT prophylaxis. Physical therapy evaluation completed with recommendations of physical medicine rehabilitation consult.   Review of Systems  Constitutional: Negative for chills and fever.  HENT: Negative for hearing loss.   Eyes: Negative for blurred vision and double vision.  Respiratory: Negative for shortness of breath.   Cardiovascular: Negative for chest pain, palpitations and leg swelling.  Gastrointestinal: Positive for constipation. Negative for nausea.       GERD  Genitourinary: Positive for urgency. Negative for dysuria, flank pain and hematuria.  Musculoskeletal: Positive for back pain.  Skin: Negative for rash.  Neurological: Positive for  weakness. Negative for seizures.       Vertigo  All other systems reviewed and are negative.  Past Medical History:  Diagnosis Date  . Blood transfusion    " no reaction from transfusion"  . BPH (benign prostatic hyperplasia)   . Bruises easily   . Bursitis    chronic hip pain  . Cancer [facility])    prostate cancer cells  . Disc disease, degenerative, lumbar or lumbosacral    Chronic back pain now  . Diverticulitis   . Esophageal ring    requiring dilations   . GERD (gastroesophageal reflux disease)   . History of GI bleed    "vessels burst in colon" x2  . History of kidney stones    chronic  . History of vertigo   . Osteoarthritis   . Osteoporosis   . Pneumonia    yrs ago  . Rash    GROIN - ITCHING PAST 3 MONTHS - it is gone now 05/02/16  . Rheumatoid arthritis(714.0)    27 years  . Spondylisthesis   . Ureteral stone 11/2012   Past Surgical History:  Procedure Laterality Date  . BALLOON DILATION N/A 10/01/2012   Procedure: BALLOON DILATION;  Surgeon: Garlan Fair, MD;  Location: [facility];  Service: Endoscopy;  Laterality: N/A;  . COLECTOMY N/A 06/10/2013   Procedure: TOTAL abdominal COLECTOMY;  Surgeon: Odis Hollingshead, MD;  Location: [facility];  Service: General;  Laterality: N/A;  . COLONOSCOPY    . CYSTOSCOPY WITH URETEROSCOPY Right 12/05/2012   Procedure: CYSTOSCOPY WITH RIGHT URETEROSCOPY AND STONE EXTRACTION;  Surgeon: Malka So, MD;  Location: [facility];  Service: Urology;  Laterality: Right;  . ESOPHAGOGASTRODUODENOSCOPY N/A 10/01/2012   Procedure: ESOPHAGOGASTRODUODENOSCOPY (EGD);  Surgeon: Garlan Fair, MD;  Location: [facility];  Service: Endoscopy;  Laterality: N/A;  .  HERNIA REPAIR    . INGUINAL HERNIA REPAIR  2007   Bilateral   . Lithotripsy for nephrolithiasis     Pt and wife not sure when but here in [place]  . Lysis of Adhesion, small bowel resection  2009  . MAXIMUM ACCESS (MAS)POSTERIOR LUMBAR INTERBODY FUSION (PLIF) 1 LEVEL N/A  05/08/2016   Procedure: LUMBAR FOUR-FIVE FOR MAXIMUM ACCESS (MAS) POSTERIOR LUMBAR INTERBODY FUSION;  Surgeon: Kary Kos, MD;  Location: [facility];  Service: Neurosurgery;  Laterality: N/A;  . NISSEN FUNDOPLICATION     about [id number], pt not sure records not currently available  . TOTAL KNEE ARTHROPLASTY  2003   Bilateral  . TRANSURETHRAL PROSTATECTOMY WITH GYRUS INSTRUMENTS N/A 12/05/2012   Procedure: TRANSURETHRAL PROSTATECTOMY WITH GYRUS INSTRUMENTS;  Surgeon: Malka So, MD;  Location: [facility];  Service: Urology;  Laterality: N/A;  . Ventral Hernia Repair with Mesh     Family History  Problem Relation Age of Onset  . Adopted: Yes  . Heart failure Mother   . Heart failure Father    Social History:  reports that he has never smoked. He has never used smokeless tobacco. He reports that he does not drink alcohol or use drugs. Allergies:  Allergies  Allergen Reactions  . Clindamycin/Lincomycin Hives, Shortness Of Breath and Swelling  . Enbrel [Etanercept] Swelling    SEVERE LEG SWELLING   . Penicillins Hives and Shortness Of Breath    Has patient had a PCN reaction causing immediate rash, facial/tongue/throat swelling, SOB or lightheadedness with hypotension: Yes Has patient had a PCN reaction causing severe rash involving mucus membranes or skin necrosis: Yes Has patient had a PCN reaction that required hospitalization No Has patient had a PCN reaction occurring within the last 10 years: No If all of the above answers are "NO", then may proceed with Cephalosporin use.   . Codeine Hives and Swelling   Medications Prior to Admission  Medication Sig Dispense Refill  . acetaminophen (TYLENOL) 650 MG CR tablet Take 650-1,300 mg by mouth 2 (two) times daily. 1300 mg in the morning & 650 mg at night    . diphenhydramine-acetaminophen (TYLENOL PM) 25-500 MG TABS tablet Take 1 tablet by mouth at bedtime.    Marland Kitchen leflunomide (ARAVA) 20 MG tablet Take 20 mg by mouth daily.     Marland Kitchen omeprazole (PRILOSEC)  20 MG capsule Take 20 mg by mouth daily.      . predniSONE (DELTASONE) 5 MG tablet Take 5 mg by mouth daily.    Marland Kitchen tetrahydrozoline-zinc (VISINE-AC) 0.05-0.25 % ophthalmic solution Place 2 drops into both eyes 3 (three) times daily as needed (for allergies).    . traMADol (ULTRAM) 50 MG tablet Take 50 mg by mouth 2 (two) times daily.      Home: Home Living Family/patient expects to be discharged to:: Private residence Living Arrangements: Spouse/significant other Available Help at Discharge: Family, Available 24 hours/day Type of Home: House Home Access: Level entry Home Layout: Two level, Bed/bath upstairs Alternate Level Stairs-Number of Steps: 1 flight Alternate Level Stairs-Rails: Left Bathroom Shower/Tub:  (jacuzzitub) Bathroom Toilet: Handicapped height Bathroom Accessibility: Yes Home Equipment: Cane - single point  Functional History: Prior Function Level of Independence: Independent Comments: Drives, cleans. Active. Functional Status:  Mobility: Bed Mobility General bed mobility comments: pt sitting OOB in recliner chair when therapist entered room.  Transfers Overall transfer level: Needs assistance Equipment used: 1 person hand held assist Transfers: Sit to/from Stand Sit to Stand: Max assist General transfer comment:  increased time, max A to rise into standing and to maintain standing as pt with heavy L sided lean. pt performed sit-to-stand x2 from recliner      ADL:    Cognition: Cognition Overall Cognitive Status: Impaired/Different from baseline Orientation Level: Oriented X4 Cognition Arousal/Alertness: Awake/alert Behavior During Therapy: WFL for tasks assessed/performed Overall Cognitive Status: Impaired/Different from baseline Area of Impairment: Safety/judgement Safety/Judgement: Decreased awareness of safety, Decreased awareness of deficits  Blood pressure (!) 190/83, pulse 66, temperature 98.1 F (36.7 C), temperature source Oral, resp. rate  19, height 5\' 7"  (1.702 m), weight 80.6 kg (177 lb 11.2 oz), SpO2 100 %. Physical Exam  Vitals reviewed. Constitutional: He is oriented to person, place, and time. He appears well-developed.  HENT:  Mild facial droop  Eyes: EOM are normal. Left eye exhibits no discharge.  Neck: Normal range of motion. Neck supple. No thyromegaly present.  Cardiovascular: Normal rate and regular rhythm.   Respiratory: Effort normal and breath sounds normal. No respiratory distress.  GI: Soft. Bowel sounds are normal. He exhibits no distension.  Neurological: He is alert and oriented to person, place, and time.  Follows commands. Fair awareness of deficits  Skin: Skin is warm and dry.  5/5 right deltoid, biceps, triceps, grip, hip flexor, knee extensor, ankle dorsiflexor and plantar flexor 4/5 left deltoid, bicep, triceps, grip, hip flexor, knee extensor, ankle plantar flexor and dorsiflexor. Sensation intact to light touch in bilateral upper and lower limbs. Visual fields are intact to confrontation testing. Bilateral There is no evidence of dysmetria finger-nose finger on the right side. There is mild dysmetria, left finger-nose to finger, as well as moderate dysmetria on left heel-to-shin. There is decreased fine motor. Left finger. Thumb opposition. Positive dysdiadochokinesis. Rapid alternating movements. Left upper extremity. Sit to stand is with mod assist  Results for orders placed or performed during the hospital encounter of 08/09/16 (from the past 24 hour(s))  Protime-INR     Status: None   Collection Time: 08/09/16 11:22 PM  Result Value Ref Range   Prothrombin Time 14.1 11.4 - 15.2 seconds   INR 1.08   APTT     Status: None   Collection Time: 08/09/16 11:22 PM  Result Value Ref Range   aPTT 28 24 - 36 seconds  CBC     Status: Abnormal   Collection Time: 08/09/16 11:22 PM  Result Value Ref Range   WBC 8.8 4.0 - 10.5 K/uL   RBC 4.58 4.22 - 5.81 MIL/uL   Hemoglobin 10.2 (L) 13.0 - 17.0  g/dL   HCT 35.0 (L) 39.0 - 52.0 %   MCV 76.4 (L) 78.0 - 100.0 fL   MCH 22.3 (L) 26.0 - 34.0 pg   MCHC 29.1 (L) 30.0 - 36.0 g/dL   RDW 16.5 (H) 11.5 - 15.5 %   Platelets 264 150 - 400 K/uL  Differential     Status: Abnormal   Collection Time: 08/09/16 11:22 PM  Result Value Ref Range   Neutrophils Relative % 43 %   Neutro Abs 3.8 1.7 - 7.7 K/uL   Lymphocytes Relative 37 %   Lymphs Abs 3.3 0.7 - 4.0 K/uL   Monocytes Relative 14 %   Monocytes Absolute 1.3 (H) 0.1 - 1.0 K/uL   Eosinophils Relative 5 %   Eosinophils Absolute 0.4 0.0 - 0.7 K/uL   Basophils Relative 1 %   Basophils Absolute 0.1 0.0 - 0.1 K/uL  Comprehensive metabolic panel     Status: Abnormal   Collection  Time: 08/09/16 11:22 PM  Result Value Ref Range   Sodium 138 135 - 145 mmol/L   Potassium 3.5 3.5 - 5.1 mmol/L   Chloride 108 101 - 111 mmol/L   CO2 24 22 - 32 mmol/L   Glucose, Bld 104 (H) 65 - 99 mg/dL   BUN 14 6 - 20 mg/dL   Creatinine, Ser 1.10 0.61 - 1.24 mg/dL   Calcium 8.6 (L) 8.9 - 10.3 mg/dL   Total Protein 7.0 6.5 - 8.1 g/dL   Albumin 3.6 3.5 - 5.0 g/dL   AST 19 15 - 41 U/L   ALT 11 (L) 17 - 63 U/L   Alkaline Phosphatase 83 38 - 126 U/L   Total Bilirubin 0.5 0.3 - 1.2 mg/dL   GFR calc non Af Amer >60 >60 mL/min   GFR calc Af Amer >60 >60 mL/min   Anion gap 6 5 - 15  I-stat troponin, ED     Status: None   Collection Time: 08/09/16 11:26 PM  Result Value Ref Range   Troponin i, poc 0.01 0.00 - 0.08 ng/mL   Comment 3          CBG monitoring, ED     Status: None   Collection Time: 08/09/16 11:26 PM  Result Value Ref Range   Glucose-Capillary 93 65 - 99 mg/dL  I-Stat Chem 8, ED     Status: Abnormal   Collection Time: 08/09/16 11:27 PM  Result Value Ref Range   Sodium 140 135 - 145 mmol/L   Potassium 3.5 3.5 - 5.1 mmol/L   Chloride 106 101 - 111 mmol/L   BUN 17 6 - 20 mg/dL   Creatinine, Ser 1.00 0.61 - 1.24 mg/dL   Glucose, Bld 101 (H) 65 - 99 mg/dL   Calcium, Ion 0.92 (L) 1.15 - 1.40 mmol/L     TCO2 24 0 - 100 mmol/L   Hemoglobin 11.9 (L) 13.0 - 17.0 g/dL   HCT 35.0 (L) 39.0 - 52.0 %  Lipid panel     Status: None   Collection Time: 08/10/16  7:37 AM  Result Value Ref Range   Cholesterol 140 0 - 200 mg/dL   Triglycerides 109 <150 mg/dL   HDL 48 >40 mg/dL   Total CHOL/HDL Ratio 2.9 RATIO   VLDL 22 0 - 40 mg/dL   LDL Cholesterol 70 0 - 99 mg/dL   Ct Angio Head W Or Wo Contrast  Addendum Date: 08/10/2016   ADDENDUM REPORT: 08/10/2016 03:59 ADDENDUM: There is a transcription error in the 1st impression point. This should read, "No emergent large vessel occlusion." Electronically Signed   By: Ulyses Jarred M.D.   On: 08/10/2016 03:59   Result Date: 08/10/2016 CLINICAL DATA:  Left-sided weakness EXAM: CT ANGIOGRAPHY HEAD AND NECK TECHNIQUE: Multidetector CT imaging of the head and neck was performed using the standard protocol during bolus administration of intravenous contrast. Multiplanar CT image reconstructions and MIPs were obtained to evaluate the vascular anatomy. Carotid stenosis measurements (when applicable) are obtained utilizing NASCET criteria, using the distal internal carotid diameter as the denominator. CONTRAST:  50 mL Isovue 370 COMPARISON:  Head CT 08/09/2016 FINDINGS: CTA NECK FINDINGS Aortic arch: There is no aneurysm or dissection of the visualized ascending aorta or aortic arch. There is a normal 3 vessel branching pattern. The visualized proximal subclavian arteries are normal. Right carotid system: The right common carotid origin is widely patent. There is no common carotid or internal carotid artery dissection or aneurysm. No hemodynamically  significant stenosis. Mild atherosclerotic calcification of the proximal internal carotid artery. Or Left carotid system: The left common carotid origin is widely patent. There is no common carotid or internal carotid artery dissection or aneurysm. No hemodynamically significant stenosis. Vertebral arteries: The vertebral system  is codominant. There is severe narrowing of the left vertebral artery origin due to atherosclerotic calcification. Both vertebral arteries are otherwise normal to their confluence with the basilar artery. Skeleton: There is no bony spinal canal stenosis. No lytic or blastic lesions. Other neck: The nasopharynx is clear. The oropharynx and hypopharynx are normal. The epiglottis is normal. The supraglottic larynx, glottis and subglottic larynx are normal. No retropharyngeal collection. The parapharyngeal spaces are preserved. The parotid and submandibular glands are normal. No sialolithiasis or salivary ductal dilatation. The thyroid gland is normal. There is no cervical lymphadenopathy. Upper chest: No pneumothorax or pleural effusion. No nodules or masses. Review of the MIP images confirms the above findings CTA HEAD FINDINGS Anterior circulation: --Intracranial internal carotid arteries: Normal. --Anterior cerebral arteries: Normal. --Middle cerebral arteries: Normal. --Posterior communicating arteries: Diminutive bilaterally Posterior circulation: --Posterior cerebral arteries: Normal. --Superior cerebellar arteries: Normal. --Basilar artery: Normal. --Anterior inferior cerebellar arteries: Normal. --Posterior inferior cerebellar arteries: Normal. Venous sinuses: As permitted by contrast timing, patent. Anatomic variants: None Delayed phase: Not performed. Review of the MIP images confirms the above findings IMPRESSION: 1. Narrowing emergent intracranial large vessel occlusion. 2. 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 3. Otherwise, no hemodynamically significant stenosis of the major cervical arteries. Electronically Signed: By: Ulyses Jarred M.D. On: 08/10/2016 00:04   Ct Angio Neck W And/or Wo Contrast  Addendum Date: 08/10/2016   ADDENDUM REPORT: 08/10/2016 03:59 ADDENDUM: There is a transcription error in the 1st impression point. This should read, "No  emergent large vessel occlusion." Electronically Signed   By: Ulyses Jarred M.D.   On: 08/10/2016 03:59   Result Date: 08/10/2016 CLINICAL DATA:  Left-sided weakness EXAM: CT ANGIOGRAPHY HEAD AND NECK TECHNIQUE: Multidetector CT imaging of the head and neck was performed using the standard protocol during bolus administration of intravenous contrast. Multiplanar CT image reconstructions and MIPs were obtained to evaluate the vascular anatomy. Carotid stenosis measurements (when applicable) are obtained utilizing NASCET criteria, using the distal internal carotid diameter as the denominator. CONTRAST:  50 mL Isovue 370 COMPARISON:  Head CT 08/09/2016 FINDINGS: CTA NECK FINDINGS Aortic arch: There is no aneurysm or dissection of the visualized ascending aorta or aortic arch. There is a normal 3 vessel branching pattern. The visualized proximal subclavian arteries are normal. Right carotid system: The right common carotid origin is widely patent. There is no common carotid or internal carotid artery dissection or aneurysm. No hemodynamically significant stenosis. Mild atherosclerotic calcification of the proximal internal carotid artery. Or Left carotid system: The left common carotid origin is widely patent. There is no common carotid or internal carotid artery dissection or aneurysm. No hemodynamically significant stenosis. Vertebral arteries: The vertebral system is codominant. There is severe narrowing of the left vertebral artery origin due to atherosclerotic calcification. Both vertebral arteries are otherwise normal to their confluence with the basilar artery. Skeleton: There is no bony spinal canal stenosis. No lytic or blastic lesions. Other neck: The nasopharynx is clear. The oropharynx and hypopharynx are normal. The epiglottis is normal. The supraglottic larynx, glottis and subglottic larynx are normal. No retropharyngeal collection. The parapharyngeal spaces are preserved. The parotid and submandibular  glands are normal. No sialolithiasis or salivary ductal  dilatation. The thyroid gland is normal. There is no cervical lymphadenopathy. Upper chest: No pneumothorax or pleural effusion. No nodules or masses. Review of the MIP images confirms the above findings CTA HEAD FINDINGS Anterior circulation: --Intracranial internal carotid arteries: Normal. --Anterior cerebral arteries: Normal. --Middle cerebral arteries: Normal. --Posterior communicating arteries: Diminutive bilaterally Posterior circulation: --Posterior cerebral arteries: Normal. --Superior cerebellar arteries: Normal. --Basilar artery: Normal. --Anterior inferior cerebellar arteries: Normal. --Posterior inferior cerebellar arteries: Normal. Venous sinuses: As permitted by contrast timing, patent. Anatomic variants: None Delayed phase: Not performed. Review of the MIP images confirms the above findings IMPRESSION: 1. Narrowing emergent intracranial large vessel occlusion. 2. 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 3. Otherwise, no hemodynamically significant stenosis of the major cervical arteries. Electronically Signed: By: Ulyses Jarred M.D. On: 08/10/2016 00:04   Mr Brain Wo Contrast  Result Date: 08/10/2016 CLINICAL DATA:  Acute left-sided weakness EXAM: MRI HEAD WITHOUT CONTRAST MRA HEAD WITHOUT CONTRAST TECHNIQUE: Multiplanar, multiecho pulse sequences of the brain and surrounding structures were obtained without intravenous contrast. Angiographic images of the head were obtained using MRA technique without contrast. COMPARISON:  CTA head and neck 08/09/2016 FINDINGS: MRI HEAD FINDINGS Brain: The midline structures are normal. There is focal diffusion restriction within the right caudate tail of and extending inferiorly to involve the posterior limb of the right internal capsule. There is no hemorrhage. No mass effect. There are additional punctate foci of diffusion restriction within the left  parietal lobe and right occipital lobe. There is beginning confluent hyperintense T2-weighted signal within the periventricular white matter, most often seen in the setting of chronic microvascular ischemia. No mass lesion. No chronic microhemorrhage or cerebral amyloid angiopathy. No hydrocephalus, age advanced atrophy or lobar predominant volume loss. No dural abnormality or extra-axial collection. Skull and upper cervical spine: The visualized skull base, calvarium, upper cervical spine and extracranial soft tissues are normal. Sinuses/Orbits: No fluid levels or advanced mucosal thickening. No mastoid effusion. Normal orbits. MRA HEAD FINDINGS Intracranial internal carotid arteries: Normal. Anterior cerebral arteries: Normal. Middle cerebral arteries: Normal. Posterior communicating arteries: Present bilaterally. Posterior cerebral arteries: Normal. Basilar artery: Normal. Vertebral arteries: Left dominant. Normal. Superior cerebellar arteries: Normal. Anterior inferior cerebellar arteries: Not clearly seen, which is not uncommon. Posterior inferior cerebellar arteries: Normal. IMPRESSION: 1. Acute ischemia of the right caudate tail extending to the posterior limb of the right internal capsule, in keeping with acute left-sided weakness. 2. Punctate foci of acute ischemia within the left parietal lobe and at the right temporal occipital junction. The distribution of lesions suggests a central cardioembolic process. 3. No hemorrhage or mass effect. 4. Chronic microvascular ischemia. 5. Normal intracranial MRA. Electronically Signed   By: Ulyses Jarred M.D.   On: 08/10/2016 04:10   Mr Jodene Nam Head/brain TD Cm  Result Date: 08/10/2016 CLINICAL DATA:  Acute left-sided weakness EXAM: MRI HEAD WITHOUT CONTRAST MRA HEAD WITHOUT CONTRAST TECHNIQUE: Multiplanar, multiecho pulse sequences of the brain and surrounding structures were obtained without intravenous contrast. Angiographic images of the head were obtained using  MRA technique without contrast. COMPARISON:  CTA head and neck 08/09/2016 FINDINGS: MRI HEAD FINDINGS Brain: The midline structures are normal. There is focal diffusion restriction within the right caudate tail of and extending inferiorly to involve the posterior limb of the right internal capsule. There is no hemorrhage. No mass effect. There are additional punctate foci of diffusion restriction within the left parietal lobe and right occipital lobe. There is beginning confluent hyperintense  T2-weighted signal within the periventricular white matter, most often seen in the setting of chronic microvascular ischemia. No mass lesion. No chronic microhemorrhage or cerebral amyloid angiopathy. No hydrocephalus, age advanced atrophy or lobar predominant volume loss. No dural abnormality or extra-axial collection. Skull and upper cervical spine: The visualized skull base, calvarium, upper cervical spine and extracranial soft tissues are normal. Sinuses/Orbits: No fluid levels or advanced mucosal thickening. No mastoid effusion. Normal orbits. MRA HEAD FINDINGS Intracranial internal carotid arteries: Normal. Anterior cerebral arteries: Normal. Middle cerebral arteries: Normal. Posterior communicating arteries: Present bilaterally. Posterior cerebral arteries: Normal. Basilar artery: Normal. Vertebral arteries: Left dominant. Normal. Superior cerebellar arteries: Normal. Anterior inferior cerebellar arteries: Not clearly seen, which is not uncommon. Posterior inferior cerebellar arteries: Normal. IMPRESSION: 1. Acute ischemia of the right caudate tail extending to the posterior limb of the right internal capsule, in keeping with acute left-sided weakness. 2. Punctate foci of acute ischemia within the left parietal lobe and at the right temporal occipital junction. The distribution of lesions suggests a central cardioembolic process. 3. No hemorrhage or mass effect. 4. Chronic microvascular ischemia. 5. Normal intracranial  MRA. Electronically Signed   By: Ulyses Jarred M.D.   On: 08/10/2016 04:10   Ct Head Code Stroke W/o Cm  Addendum Date: 08/10/2016   ADDENDUM REPORT: 08/10/2016 00:13 ADDENDUM: These results were called by telephone at the time of interpretation on 08/10/2016 at 11:58 p.m. to Dr. Kerney Elbe, who verbally acknowledged these results. Electronically Signed   By: Ulyses Jarred M.D.   On: 08/10/2016 00:13   Result Date: 08/10/2016 CLINICAL DATA:  Code stroke.  Left-sided weakness EXAM: CT HEAD WITHOUT CONTRAST TECHNIQUE: Contiguous axial images were obtained from the base of the skull through the vertex without intravenous contrast. COMPARISON:  Head CT 05/14/2015 FINDINGS: Brain: No mass lesion, intraparenchymal hemorrhage or extra-axial collection. No evidence of acute cortical infarct. There is periventricular hypoattenuation compatible with chronic microvascular disease. Vascular: No hyperdense vessel or unexpected calcification. Skull: Normal visualized skull base, calvarium and extracranial soft tissues. Sinuses/Orbits: No sinus fluid levels or advanced mucosal thickening. No mastoid effusion. Normal orbits. ASPECTS [facility] Stroke Program Early CT Score) - Ganglionic level infarction (caudate, lentiform nuclei, internal capsule, insula, M1-M3 cortex): 7 - Supraganglionic infarction (M4-M6 cortex): 3 Total score (0-10 with 10 being normal): 10 IMPRESSION: 1. 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 2. ASPECTS is 10. Electronically Signed: By: Ulyses Jarred M.D. 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 Does the need for close, 24 hr/day medical supervision in concert with the patient's rehab needs make it unreasonable for this patient to be served in a less intensive setting? Yes 2. Co-Morbidities requiring supervision/potential complications: History of GI bleed, history of rheumatoid arthritis on  chronic prednisone, history of lumbar fusion 3. Due to bladder management, bowel management, safety, skin/wound care, disease management, medication administration, pain management and patient education, does the patient require 24 hr/day rehab nursing? Yes 4. Does the patient require coordinated care of a physician, rehab nurse, PT (1-2 hrs/day, 5 days/week) and OT (1-2 hrs/day, 5 days/week) to address physical and functional deficits in the context of the above medical diagnosis(es)? Yes Addressing deficits in the following areas: balance, endurance, locomotion, strength, transferring, bowel/bladder control, bathing, dressing, feeding, grooming, toileting and psychosocial support 5. Can the patient actively participate in an intensive therapy program of at least 3 hrs of therapy per day at least 5 days per week? Yes  6. The potential for patient to make measurable gains while on inpatient rehab is excellent 7. Anticipated functional outcomes upon discharge from inpatient rehab are modified independent and supervision  with PT, modified independent and supervision with OT, n/a with SLP. 8. Estimated rehab length of stay to reach the above functional goals is: 10-14d 9.  10. Does the patient have adequate social supports and living environment to accommodate these discharge functional goals? Yes 11. Anticipated D/C setting: Home 12. Anticipated post D/C treatments: [place] therapy 13. Overall Rehab/Functional Prognosis: excellent  RECOMMENDATIONS: This patient's condition is appropriate for continued rehabilitative care in the following setting: CIR Patient has agreed to participate in recommended program. Yes Note that insurance prior authorization may be required for reimbursement for recommended care.  Comment: Needs to complete stroke workup  Charlett Blake M.D. [facility] FAAPM&R (Sports Med, Neuromuscular Med) Diplomate Am Board of Electrodiagnostic Med  Cathlyn Parsons., PA-C 08/10/2016

## 2016-08-10 NOTE — Progress Notes (Signed)
Patient transporting for MRI/MRA.

## 2016-08-10 NOTE — Progress Notes (Signed)
STROKE TEAM PROGRESS NOTE   HISTORY OF PRESENT ILLNESS (per record) [name] is an 76 y.o. male who was eating dinner tonight when he suddenly sneezed and then became weak on his left side. The weakness involved the arm and leg; when first responder arrived, a left facial droop was also noted. The patient denies having sensory changes on the left. He also denies headache, neck pain, chest pain, abdominal pain or limb pain. He has not been confused and speech was not affected. He was LKW 08/09/2016 at 2210. Patient was not administered IV t-PA based upon risk benefit profile, the patient elected not to undergo tPA administration. He was admitted for further evaluation and treatment.   SUBJECTIVE (INTERVAL HISTORY) Wife is at bedside. Pt recounted his extensive GI history with me. Pt had hx of hiatal hernia s/p repair and recently he feels recurrent symptoms and plan to see his GI physician Dr. [name] today. Likely not a good candidate for TEE. He also had LGIB from 2012 to 2015, s/p large intestine coiling, eventually had colon resection in 2015. Since then, no LGIB. This time, he had sneeze and then left sided weakness.    OBJECTIVE Temp:  [98.1 F (36.7 C)-98.3 F (36.8 C)] 98.1 F (36.7 C) (04/26 0400) Pulse Rate:  [54-73] 66 (04/26 1000) Cardiac Rhythm: Normal sinus rhythm (04/26 0700) Resp:  [9-25] 19 (04/26 1000) BP: (161-207)/(79-100) 190/83 (04/26 1000) SpO2:  [93 %-100 %] 100 % (04/26 1000) Weight:  [80.6 kg (177 lb 11.2 oz)-82.6 kg (182 lb 1.6 oz)] 80.6 kg (177 lb 11.2 oz) (04/26 0233)  CBC:   Recent Labs Lab 08/09/16 2322 08/09/16 2327  WBC 8.8  --   NEUTROABS 3.8  --   HGB 10.2* 11.9*  HCT 35.0* 35.0*  MCV 76.4*  --   PLT 264  --     Basic Metabolic Panel:   Recent Labs Lab 08/09/16 2322 08/09/16 2327  NA 138 140  K 3.5 3.5  CL 108 106  CO2 24  --   GLUCOSE 104* 101*  BUN 14 17  CREATININE 1.10 1.00  CALCIUM 8.6*  --     Lipid Panel:      Component Value Date/Time   CHOL 140 08/10/2016 0737   TRIG 109 08/10/2016 0737   HDL 48 08/10/2016 0737   CHOLHDL 2.9 08/10/2016 0737   VLDL 22 08/10/2016 0737   LDLCALC 70 08/10/2016 0737   HgbA1c: No results found for: HGBA1C Urine Drug Screen: No results found for: LABOPIA, COCAINSCRNUR, LABBENZ, AMPHETMU, THCU, LABBARB  Alcohol Level No results found for: [place] I have personally reviewed the radiological images below and agree with the radiology interpretations.  Ct Head Code Stroke W/o Cm 08/10/2016 1. 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 2. ASPECTS is 10.   Ct Angio Head W Or Wo Contrast Ct Angio Neck W And/or Wo Contrast 08/10/2016 1. No emergent intracranial large vessel occlusion. 2. 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 3. Otherwise, no hemodynamically significant stenosis of the major cervical arteries.   Mr Brain Wo Contrast 08/10/2016 1. Acute ischemia of the right caudate tail extending to the posterior limb of the right internal capsule, in keeping with acute left-sided weakness. 2. Punctate foci of acute ischemia within the left parietal lobe and at the right temporal occipital junction. The distribution of lesions suggests a central cardioembolic process. 3. No hemorrhage or mass effect. 4. Chronic microvascular ischemia.  Mr Jodene Nam Head/brain Wo Cm 08/10/2016 5. Normal intracranial MRA.   LE venous dopplers There is no DVT or SVT noted in the bilateral lower extremities.   TTE pending   PHYSICAL EXAM  Temp:  [98.1 F (36.7 C)-98.5 F (36.9 C)] 98.5 F (36.9 C) (04/26 1339) Pulse Rate:  [54-73] 67 (04/26 1339) Resp:  [9-25] 20 (04/26 1339) BP: (155-207)/(79-100) 155/90 (04/26 1339) SpO2:  [93 %-100 %] 98 % (04/26 1339) Weight:  [177 lb 11.2 oz (80.6 kg)-182 lb 1.6 oz (82.6 kg)] 177 lb 11.2 oz (80.6 kg) (04/26 0233)  General - Well nourished, well developed, in  no apparent distress.  Ophthalmologic - Fundi not visualized.  Cardiovascular - Regular rate and rhythm.  Mental Status -  Level of arousal and orientation to time, place, and person were intact. Language including expression, naming, repetition, comprehension was assessed and found intact. Fund of Knowledge was assessed and was intact.  Cranial Nerves II - XII - II - Visual field intact OU. III, IV, VI - Extraocular movements intact. V - Facial sensation intact bilaterally. VII - right facial droop. VIII - Hearing & vestibular intact bilaterally. X - Palate elevates symmetrically. XI - Chin turning & shoulder shrug intact bilaterally. XII - Tongue protrusion intact.  Motor Strength - The patient's strength was normal in RUE and RLE, but 4/5 LUE and 4+/5 LLE and pronator drift was absent on the left.  Bulk was normal and fasciculations were absent.   Motor Tone - Muscle tone was assessed at the neck and appendages and was normal.  Reflexes - The patient's reflexes were 1+ in all extremities and he had no pathological reflexes.  Sensory - Light touch, temperature/pinprick were assessed and were symmetrical.    Coordination - The patient had normal movements in the hands with no ataxia or dysmetria.  Tremor was absent.  Gait and Station - deferred.   ASSESSMENT/PLAN [name] is a 76 y.o. 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 He did not receive IV t-PA as based upon risk benefit profile, the patient elected not to undergo tPA administration.   Stroke:  Bilateral (R caudate/PLIC, L parietal and R temporal occipital jxn) infarct involving right MCA/PCA, MCA, left MCA/ACA, embolic secondary to unknown source after violent sneeze  Resultant  Left facial droop and left hemiparesis  Code Stroke CT no  acute stroke. small vessel disease. Aspects 10.    CTA head/neck no ELVO. Severe narrowing L VA.   MRI  R caudate/PLIC infarct. punctate L parietal and R temporal occipital jxn infarcts. small vessel disease   MRA  Unremarkable   LE Doppler negative for DVT   2D Echo  pending  Would like TEE but feel not a good candidate d/t hx GI surgery on esophagus / stomach.  [place] electrophysiologist consult requested to consider placement of an implantable loop recorder to evaluate for atrial fibrillation as etiology of stroke. This has been explained to patient/family by Dr. Erlinda Hong and they are agreeable.    LDL 70  HgbA1c pending  Lovenox 40 mg sq daily for VTE prophylaxis Diet Heart Room service appropriate? Yes; Fluid consistency: Thin  No antithrombotic prior to admission, now on aspirin 325 mg daily. If pt not able to tolerate ASA due to hiatal hernia flare, may consider plavix.   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 Will discuss with pt GI physician Dr. [name] before starting anticoagulation if needed.   Hypertension  No hx HTN  As high as 207/79 Permissive hypertension (OK if < 220/120) but gradually normalize in 5-7 days Long-term BP goal normotensive  Hyperlipidemia  Home meds:  No statin  LDL 70, goal < 70  Now on lipitor 40 daily  Continue statin at discharge  Other Stroke Risk Factors  Advanced age  UDS / ETOH level  not performed   Other Active Problems  Lumbar spinal stenosis - follows with Dr. Stark Klein at [facility]  Fe deficiency anemia  RA on low dose  prednisone  Hiatal hernia - recent flare - follows with Dr. [name] [facility] day # 0  Rosalin Hawking, MD PhD Stroke Neurology 08/10/2016 2:07 PM    To contact Stroke Continuity provider, please refer to [email or web address] After hours, contact General Neurology

## 2016-08-10 NOTE — Evaluation (Signed)
Occupational Therapy Evaluation Patient Details Name: [name] MRN: [id number] DOB: 30-Oct-1940 Today's Date: 08/10/2016    History of Present Illness Pt is a 76 y/o male admitted from home secondary to acute L sided weakness in which he fell to the floor. In ED, head CT revealed chronic microvascular ischemia. Neurology has been consulted. PMH including but not limited to prostate cancer, RA and hx of bilateral TKA in 2003.   Clinical Impression   PTA Pt independent in ADL and mobility (although had recent back surgery in Jan where he needed help). Pt currently mod A for ADL and +2 max for transfers (or use of STEDY). Please see OT problem list below. Pt will require skilled OT in the acute setting to maxmize safety and independence in ADL and functional transfers. Pt will require CIR level therapy to return to PLOF. Next session to work on bilateral hand tasks and establishing HEP for LUE.    Follow Up Recommendations  CIR    Equipment Recommendations  Other (comment) (defer to next venue)    Recommendations for Other Services Rehab consult     Precautions / Restrictions Precautions Precautions: Fall Restrictions Weight Bearing Restrictions: No      Mobility Bed Mobility               General bed mobility comments: pt sitting OOB in recliner chair when therapist entered room.   Transfers Overall transfer level: Needs assistance Equipment used: Ambulation equipment used Transfers: Sit to/from Stand Sit to Stand: Max assist         General transfer comment: increased time, max A to rise into standing and to maintain standing as pt with heavy L sided lean, Pt able to grab onto bar with BUE    Balance Overall balance assessment: Needs assistance Sitting-balance support: Feet supported Sitting balance-Leahy Scale: Poor Sitting balance - Comments: pt requiring assistance and support of recliner chair for UE testing Postural control: Left lateral lean Standing  balance support: During functional activity;Single extremity supported Standing balance-Leahy Scale: Poor Standing balance comment: max A to maintain standing, pt with heavy L lateral lean                           ADL either performed or assessed with clinical judgement   ADL Overall ADL's : Needs assistance/impaired Eating/Feeding: Minimal assistance;Sitting   Grooming: Oral care;Moderate assistance;Sitting Grooming Details (indicate cue type and reason): Pt needed assist with stabilizing toothbrush, and assist squeezing with R hand on tube of toothpaste Upper Body Bathing: Minimal assistance;Sitting   Lower Body Bathing: Moderate assistance;Sitting/lateral leans   Upper Body Dressing : Minimal assistance;Sitting   Lower Body Dressing: Maximal assistance;Sit to/from stand;+2 for physical assistance   Toilet Transfer: Maximal assistance;+2 for physical assistance;BSC Toilet Transfer Details (indicate cue type and reason): Staff has been using STEDY which is appropriate Toileting- Clothing Manipulation and Hygiene: Maximal assistance;+2 for physical assistance Toileting - Clothing Manipulation Details (indicate cue type and reason): Pt able to reach, needs assist for sit to stand Tub/ Shower Transfer: Maximal assistance;+2 for physical assistance   Functional mobility during ADLs:  (not attempted this session, STEDY used to transfer to bed) General ADL Comments: Pt VERY motivated and willing to work with therapy     Vision Baseline Vision/History: Wears glasses Wears Glasses: At all times Patient Visual Report: No change from baseline Vision Assessment?: No apparent visual deficits     Perception     Praxis  Pertinent Vitals/Pain Pain Assessment: No/denies pain     Hand Dominance Right   Extremity/Trunk Assessment Upper Extremity Assessment Upper Extremity Assessment: LUE deficits/detail LUE Deficits / Details: Dysmetria with finger-to-nose. MMT  revealed 3+/5 for shoulder flexion, shoulder abduction and elbow flexion. Sensation grossly intact. Pt with decreased grasp strength (but able to get full digit extension and flexion) and decreased fine motor coordination.   Lower Extremity Assessment Lower Extremity Assessment: LLE deficits/detail;Defer to PT evaluation LLE Deficits / Details: MMT revealed 3+/5 for hip flexion, hip abduction and hip adduction; 4/5 for knee flexion, knee extension and ankle DF. Sensation grossly intact.   Cervical / Trunk Assessment Cervical / Trunk Assessment: Other exceptions Cervical / Trunk Exceptions: pt had spinal sx (L4-5 PLIF) in January 2018   Communication Communication Communication: No difficulties   Cognition Arousal/Alertness: Awake/alert Behavior During Therapy: WFL for tasks assessed/performed Overall Cognitive Status: Impaired/Different from baseline Area of Impairment: Safety/judgement                         Safety/Judgement: Decreased awareness of safety;Decreased awareness of deficits         General Comments  wife present for entire session; Pt is former [employer] copy      Home Living Family/patient expects to be discharged to:: Private residence Living Arrangements: Spouse/significant other Available Help at Discharge: Family;Available 24 hours/day Type of Home: House Home Access: Level entry     Home Layout: Two level;Bed/bath upstairs Alternate Level Stairs-Number of Steps: 1 flight Alternate Level Stairs-Rails: Left Bathroom Shower/Tub: Tub only ([id number])   Bathroom Toilet: Handicapped height Bathroom Accessibility: Yes How Accessible: Accessible via walker Home Equipment: Cane - single point          Prior Functioning/Environment Level of Independence: Independent        Comments: Drives, cleans. Active.        OT Problem List: Decreased strength;Decreased range of motion;Decreased activity  tolerance;Impaired balance (sitting and/or standing);Decreased safety awareness;Decreased coordination;Decreased knowledge of use of DME or AE;Impaired UE functional use      OT Treatment/Interventions: Self-care/ADL training;Therapeutic exercise;Neuromuscular education;Energy conservation;DME and/or AE instruction;Therapeutic activities;Patient/family education;Balance training    OT Goals(Current goals can be found in the care plan section) Acute Rehab OT Goals Patient Stated Goal: to return to independence OT Goal Formulation: With patient/family Time For Goal Achievement: 08/24/16 Potential to Achieve Goals: Good ADL Goals Pt Will Perform Grooming: sitting;with modified independence Pt Will Perform Upper Body Bathing: with supervision;sitting;with adaptive equipment Pt Will Perform Lower Body Bathing: with supervision;sitting/lateral leans;with adaptive equipment Pt Will Transfer to Toilet: with min guard assist;ambulating;regular height toilet;grab bars Pt Will Perform Toileting - Clothing Manipulation and hygiene: sit to/from stand;with min guard assist Additional ADL Goal #1: Pt will be able to get in a tub with min guard assist in order to soak to address RA comfort needs, with caregiver independent in assisting  OT Frequency: Min 3X/week   Barriers to D/C:            Co-evaluation              End of Session Equipment Utilized During Treatment: Gait belt Nurse Communication: Mobility status;Need for lift equipment  Activity Tolerance: Patient tolerated treatment well Patient left: in bed;with call bell/phone within reach;with nursing/sitter in room;with family/visitor present  OT Visit Diagnosis: Other abnormalities of gait and mobility (R26.89);Muscle weakness (generalized) (M62.81);Other symptoms and signs involving the nervous system (  R29.898);Hemiplegia and hemiparesis Hemiplegia - Right/Left: Left Hemiplegia - dominant/non-dominant: Non-Dominant Hemiplegia -  caused by: Cerebral infarction                Time: 6606-0045 OT Time Calculation (min): 42 min Charges:  OT General Charges $OT Visit: 1 Procedure OT Evaluation $OT Eval Moderate Complexity: 1 Procedure OT Treatments $Self Care/Home Management : 23-37 mins G-Codes:     Hulda Humphrey OTR/L [place] 08/10/2016, 1:13 PM

## 2016-08-10 NOTE — Plan of Care (Signed)
Problem: Education: Goal: Knowledge of secondary prevention will improve Outcome: Progressing Patient provided stoke booklet. RN reviewed patients current treatment plan and standardized stroke work-up. Patient and patient wife v/u.  Problem: Self-Care: Goal: Ability to communicate needs accurately will improve Outcome: Not Applicable Date Met: 49/96/92 Patient able to communicate needs appropriately upon arrival to unit  Problem: Nutrition: Goal: Risk of aspiration will decrease Outcome: Progressing Patient NPO

## 2016-08-10 NOTE — H&P (Signed)
History and Physical    [name] LNL:[id number] DOB: 1941/01/09 DOA: 08/09/2016  PCP: [name], MD   Patient coming from: Home  Chief Complaint: Acute left-sided weakness   HPI: [name] is a 76 y.o. 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 Patient reports that he was in his usual state of health and having an uneventful day when he was getting ready for bed, reaching for something, and sneezed twice. He reports that with the second sneeze, he developed acute-onset of weakness involving the left arm and left leg which resulted in him falling to the ground. He did not strike his head or suffer any significant injury from the fall, but had difficulty getting back up due to the left leg and left arm weakness and called to his wife for help. EMS was called and the patient required help getting up from the floor. He denied headache, change in vision or hearing, or confusion. His speech did not appear to be affected and he was communicating normally. He denies ever experiencing something similar previously. By time of his arrival in the ED, he reported that his left arm weakness seemed to be improving. There has not been any recent chest pain or palpitations and no recent melena or hematochezia. EMS initial noted a left facial droop, but this apparently resolved by time of arrival in ED.   ED Course: Upon arrival to the ED, patient is found to be afebrile, saturating well on room air, hypertensive to 170/100, and with vitals otherwise stable. EKG features a sinus rhythm with PVCs. Chemistry panel was unremarkable and CBC is notable for a microcytic anemia with hemoglobin of 10.2 and MCV of 76.4. INR is within the normal limits and so  his troponin. Noncontrast head CT is negative for acute intracranial abnormality, but notable for chronic microvascular ischemia. CTA of the head and neck is notable for severe narrowing of the left vertebral artery origin due to atherosclerotic calcification, but no emergent large vessel occlusion or other hemodynamically significant stenosis of the major cervical arteries. Neurology was consulted and has evaluated the patient and the emergency department. 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  Review of Systems:  All other systems reviewed and apart from HPI, are negative.  Past Medical History:  Diagnosis Date  . Blood transfusion    " no reaction from transfusion"  . BPH (benign prostatic hyperplasia)   . Bruises easily   . Bursitis    chronic hip pain  . Cancer [facility])    prostate cancer cells  . Disc disease, degenerative, lumbar or lumbosacral    Chronic back pain now  . Diverticulitis   . Esophageal ring    requiring dilations   . GERD (gastroesophageal reflux disease)   . History of GI bleed    "vessels burst in colon" x2  . History of kidney stones    chronic  . History of vertigo   . Osteoarthritis   . Osteoporosis   . Pneumonia    yrs ago  . Rash    GROIN - ITCHING PAST 3 MONTHS - it is gone now 05/02/16  . Rheumatoid arthritis(714.0)  27 years  . Spondylisthesis   . Ureteral stone 11/2012    Past Surgical History:  Procedure Laterality Date  . BALLOON DILATION N/A 10/01/2012   Procedure: BALLOON DILATION;  Surgeon: Garlan Fair, MD;  Location: [facility];  Service: Endoscopy;  Laterality: N/A;  . COLECTOMY N/A 06/10/2013   Procedure: TOTAL abdominal COLECTOMY;  Surgeon: Odis Hollingshead, MD;  Location: [facility];  Service: General;  Laterality: N/A;  . COLONOSCOPY    . CYSTOSCOPY  WITH URETEROSCOPY Right 12/05/2012   Procedure: CYSTOSCOPY WITH RIGHT URETEROSCOPY AND STONE EXTRACTION;  Surgeon: Malka So, MD;  Location: [facility];  Service: Urology;  Laterality: Right;  . ESOPHAGOGASTRODUODENOSCOPY N/A 10/01/2012   Procedure: ESOPHAGOGASTRODUODENOSCOPY (EGD);  Surgeon: Garlan Fair, MD;  Location: [facility];  Service: Endoscopy;  Laterality: N/A;  . HERNIA REPAIR    . INGUINAL HERNIA REPAIR  2007   Bilateral   . Lithotripsy for nephrolithiasis     Pt and wife not sure when but here in [place]  . Lysis of Adhesion, small bowel resection  2009  . MAXIMUM ACCESS (MAS)POSTERIOR LUMBAR INTERBODY FUSION (PLIF) 1 LEVEL N/A 05/08/2016   Procedure: LUMBAR FOUR-FIVE FOR MAXIMUM ACCESS (MAS) POSTERIOR LUMBAR INTERBODY FUSION;  Surgeon: Kary Kos, MD;  Location: [place];  Service: Neurosurgery;  Laterality: N/A;  . NISSEN FUNDOPLICATION     about 5625, pt not sure records not currently available  . TOTAL KNEE ARTHROPLASTY  2003   Bilateral  . TRANSURETHRAL PROSTATECTOMY WITH GYRUS INSTRUMENTS N/A 12/05/2012   Procedure: TRANSURETHRAL PROSTATECTOMY WITH GYRUS INSTRUMENTS;  Surgeon: Malka So, MD;  Location: [facility];  Service: Urology;  Laterality: N/A;  . Ventral Hernia Repair with Mesh       reports that he has never smoked. He has never used smokeless tobacco. He reports that he does not drink alcohol or use drugs.  Allergies  Allergen Reactions  . Clindamycin/Lincomycin Hives, Shortness Of Breath and Swelling  . Enbrel [Etanercept] Swelling    SEVERE LEG SWELLING   . Penicillins Hives and Shortness Of Breath    Has patient had a PCN reaction causing immediate rash, facial/tongue/throat swelling, SOB or lightheadedness with hypotension: Yes Has patient had a PCN reaction causing severe rash involving mucus membranes or skin necrosis: Yes Has patient had a PCN reaction that required hospitalization No Has patient had a PCN reaction occurring within the last 10 years:  No If all of the above answers are "NO", then may proceed with Cephalosporin use.   . Codeine Hives and Swelling    Family History  Problem Relation Age of Onset  . Adopted: Yes  . Heart failure Mother   . Heart failure Father      Prior to Admission medications   Medication Sig Start Date End Date Taking? Authorizing Provider  acetaminophen (TYLENOL) 650 MG CR tablet Take 650-1,300 mg by mouth 2 (two) times daily. 1300 mg in the morning & 650 mg at night   Yes Historical Provider, MD  diphenhydramine-acetaminophen (TYLENOL PM) 25-500 MG TABS tablet Take 1 tablet by mouth at bedtime.   Yes Historical Provider, MD  leflunomide (ARAVA) 20 MG tablet Take 20 mg by mouth daily.  07/16/13  Yes Historical Provider, MD  omeprazole (PRILOSEC) 20 MG capsule Take 20 mg by mouth daily.     Yes Historical Provider, MD  predniSONE (DELTASONE) 5 MG tablet Take 5 mg by mouth daily.   Yes Historical Provider, MD  tetrahydrozoline-zinc (VISINE-AC) 0.05-0.25 % ophthalmic  solution Place 2 drops into both eyes 3 (three) times daily as needed (for allergies).   Yes Historical Provider, MD  traMADol (ULTRAM) 50 MG tablet Take 50 mg by mouth 2 (two) times daily.   Yes Historical Provider, MD    Physical Exam: Vitals:   08/09/16 2300 08/09/16 2350 08/10/16 0000 08/10/16 0045  BP:  (!) 191/89 (!) 171/100 (!) 166/98  Pulse:  73 73 70  Resp:  (!) 9 12 12   Temp:  98.3 F (36.8 C)    TempSrc:  Oral    SpO2:  96% 97% 99%  Weight: 82.6 kg (182 lb 1.6 oz)     Height: 5' 7.5" (1.715 m)         Constitutional: NAD, calm, comfortable Eyes: PERTLA, lids and conjunctivae normal ENMT: Mucous membranes are moist. Posterior pharynx clear of any exudate or lesions.   Neck: normal, supple, no masses, no thyromegaly Respiratory: clear to auscultation bilaterally, no wheezing, no crackles. Normal respiratory effort.    Cardiovascular: S1 & S2 heard, regular rate and rhythm. No extremity edema. No significant  JVD. Abdomen: No distension, no tenderness, no masses palpated. Bowel sounds normal.  Musculoskeletal: no clubbing / cyanosis. No joint deformity upper and lower extremities.  Skin: no significant rashes, lesions, ulcers. Warm, dry, well-perfused. Neurologic: CN 2-12 grossly intact. Sensation intact, patellar DTR's normal. Strength diminished throughout left leg, and throughout left arm to a lessor extent.  Psychiatric: Alert and oriented x 3. Normal mood and affect.     Labs on Admission: I have personally reviewed following labs and imaging studies  CBC:  Recent Labs Lab 08/09/16 2322 08/09/16 2327  WBC 8.8  --   NEUTROABS 3.8  --   HGB 10.2* 11.9*  HCT 35.0* 35.0*  MCV 76.4*  --   PLT 264  --    Basic Metabolic Panel:  Recent Labs Lab 08/09/16 2322 08/09/16 2327  NA 138 140  K 3.5 3.5  CL 108 106  CO2 24  --   GLUCOSE 104* 101*  BUN 14 17  CREATININE 1.10 1.00  CALCIUM 8.6*  --    GFR: Estimated Creatinine Clearance: 66.3 mL/min (by C-G formula based on SCr of 1 mg/dL). Liver Function Tests:  Recent Labs Lab 08/09/16 2322  AST 19  ALT 11*  ALKPHOS 83  BILITOT 0.5  PROT 7.0  ALBUMIN 3.6   No results for input(s): LIPASE, AMYLASE in the last 168 hours. No results for input(s): AMMONIA in the last 168 hours. Coagulation Profile:  Recent Labs Lab 08/09/16 2322  INR 1.08   Cardiac Enzymes: No results for input(s): CKTOTAL, CKMB, CKMBINDEX, TROPONINI in the last 168 hours. BNP (last 3 results) No results for input(s): PROBNP in the last 8760 hours. HbA1C: No results for input(s): HGBA1C in the last 72 hours. CBG:  Recent Labs Lab 08/09/16 2326  GLUCAP 93   Lipid Profile: No results for input(s): CHOL, HDL, LDLCALC, TRIG, CHOLHDL, LDLDIRECT in the last 72 hours. Thyroid Function Tests: No results for input(s): TSH, T4TOTAL, FREET4, T3FREE, THYROIDAB in the last 72 hours. Anemia Panel: No results for input(s): VITAMINB12, FOLATE, FERRITIN,  TIBC, IRON, RETICCTPCT in the last 72 hours. Urine analysis:    Component Value Date/Time   COLORURINE AMBER (A) 06/20/2015 1658   APPEARANCEUR CLEAR 06/20/2015 1658   LABSPEC 1.026 06/20/2015 1658   PHURINE 5.5 06/20/2015 1658   GLUCOSEU NEGATIVE 06/20/2015 1658   HGBUR NEGATIVE 06/20/2015 1658   BILIRUBINUR MODERATE (A) 06/20/2015 1658  KETONESUR 40 (A) 06/20/2015 1658   PROTEINUR 30 (A) 06/20/2015 1658   UROBILINOGEN 0.2 02/03/2013 1102   NITRITE NEGATIVE 06/20/2015 1658   LEUKOCYTESUR NEGATIVE 06/20/2015 1658   Sepsis Labs: @LABRCNTIP (procalcitonin:4,lacticidven:4) )No results found for this or any previous visit (from the past 240 hour(s)).   Radiological Exams on Admission: Ct Angio Head W Or Wo Contrast  Result Date: 08/10/2016 CLINICAL DATA:  Left-sided weakness EXAM: CT ANGIOGRAPHY HEAD AND NECK TECHNIQUE: Multidetector CT imaging of the head and neck was performed using the standard protocol during bolus administration of intravenous contrast. Multiplanar CT image reconstructions and MIPs were obtained to evaluate the vascular anatomy. Carotid stenosis measurements (when applicable) are obtained utilizing NASCET criteria, using the distal internal carotid diameter as the denominator. CONTRAST:  50 mL Isovue 370 COMPARISON:  Head CT 08/09/2016 FINDINGS: CTA NECK FINDINGS Aortic arch: There is no aneurysm or dissection of the visualized ascending aorta or aortic arch. There is a normal 3 vessel branching pattern. The visualized proximal subclavian arteries are normal. Right carotid system: The right common carotid origin is widely patent. There is no common carotid or internal carotid artery dissection or aneurysm. No hemodynamically significant stenosis. Mild atherosclerotic calcification of the proximal internal carotid artery. Or Left carotid system: The left common carotid origin is widely patent. There is no common carotid or internal carotid artery dissection or aneurysm. No  hemodynamically significant stenosis. Vertebral arteries: The vertebral system is codominant. There is severe narrowing of the left vertebral artery origin due to atherosclerotic calcification. Both vertebral arteries are otherwise normal to their confluence with the basilar artery. Skeleton: There is no bony spinal canal stenosis. No lytic or blastic lesions. Other neck: The nasopharynx is clear. The oropharynx and hypopharynx are normal. The epiglottis is normal. The supraglottic larynx, glottis and subglottic larynx are normal. No retropharyngeal collection. The parapharyngeal spaces are preserved. The parotid and submandibular glands are normal. No sialolithiasis or salivary ductal dilatation. The thyroid gland is normal. There is no cervical lymphadenopathy. Upper chest: No pneumothorax or pleural effusion. No nodules or masses. Review of the MIP images confirms the above findings CTA HEAD FINDINGS Anterior circulation: --Intracranial internal carotid arteries: Normal. --Anterior cerebral arteries: Normal. --Middle cerebral arteries: Normal. --Posterior communicating arteries: Diminutive bilaterally Posterior circulation: --Posterior cerebral arteries: Normal. --Superior cerebellar arteries: Normal. --Basilar artery: Normal. --Anterior inferior cerebellar arteries: Normal. --Posterior inferior cerebellar arteries: Normal. Venous sinuses: As permitted by contrast timing, patent. Anatomic variants: None Delayed phase: Not performed. Review of the MIP images confirms the above findings IMPRESSION: 1. Narrowing emergent intracranial large vessel occlusion. 2. 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 3. Otherwise, no hemodynamically significant stenosis of the major cervical arteries. Electronically Signed   By: Ulyses Jarred M.D.   On: 08/10/2016 00:04   Ct Angio Neck W And/or Wo Contrast  Result Date: 08/10/2016 CLINICAL DATA:  Left-sided weakness EXAM: CT  ANGIOGRAPHY HEAD AND NECK TECHNIQUE: Multidetector CT imaging of the head and neck was performed using the standard protocol during bolus administration of intravenous contrast. Multiplanar CT image reconstructions and MIPs were obtained to evaluate the vascular anatomy. Carotid stenosis measurements (when applicable) are obtained utilizing NASCET criteria, using the distal internal carotid diameter as the denominator. CONTRAST:  50 mL Isovue 370 COMPARISON:  Head CT 08/09/2016 FINDINGS: CTA NECK FINDINGS Aortic arch: There is no aneurysm or dissection of the visualized ascending aorta or aortic arch. There is a normal 3 vessel branching pattern. The visualized proximal  subclavian arteries are normal. Right carotid system: The right common carotid origin is widely patent. There is no common carotid or internal carotid artery dissection or aneurysm. No hemodynamically significant stenosis. Mild atherosclerotic calcification of the proximal internal carotid artery. Or Left carotid system: The left common carotid origin is widely patent. There is no common carotid or internal carotid artery dissection or aneurysm. No hemodynamically significant stenosis. Vertebral arteries: The vertebral system is codominant. There is severe narrowing of the left vertebral artery origin due to atherosclerotic calcification. Both vertebral arteries are otherwise normal to their confluence with the basilar artery. Skeleton: There is no bony spinal canal stenosis. No lytic or blastic lesions. Other neck: The nasopharynx is clear. The oropharynx and hypopharynx are normal. The epiglottis is normal. The supraglottic larynx, glottis and subglottic larynx are normal. No retropharyngeal collection. The parapharyngeal spaces are preserved. The parotid and submandibular glands are normal. No sialolithiasis or salivary ductal dilatation. The thyroid gland is normal. There is no cervical lymphadenopathy. Upper chest: No pneumothorax or pleural  effusion. No nodules or masses. Review of the MIP images confirms the above findings CTA HEAD FINDINGS Anterior circulation: --Intracranial internal carotid arteries: Normal. --Anterior cerebral arteries: Normal. --Middle cerebral arteries: Normal. --Posterior communicating arteries: Diminutive bilaterally Posterior circulation: --Posterior cerebral arteries: Normal. --Superior cerebellar arteries: Normal. --Basilar artery: Normal. --Anterior inferior cerebellar arteries: Normal. --Posterior inferior cerebellar arteries: Normal. Venous sinuses: As permitted by contrast timing, patent. Anatomic variants: None Delayed phase: Not performed. Review of the MIP images confirms the above findings IMPRESSION: 1. Narrowing emergent intracranial large vessel occlusion. 2. 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 3. Otherwise, no hemodynamically significant stenosis of the major cervical arteries. Electronically Signed   By: Ulyses Jarred M.D.   On: 08/10/2016 00:04   Ct Head Code Stroke W/o Cm  Addendum Date: 08/10/2016   ADDENDUM REPORT: 08/10/2016 00:13 ADDENDUM: These results were called by telephone at the time of interpretation on 08/10/2016 at 11:58 p.m. to Dr. Kerney Elbe, who verbally acknowledged these results. Electronically Signed   By: Ulyses Jarred M.D.   On: 08/10/2016 00:13   Result Date: 08/10/2016 CLINICAL DATA:  Code stroke.  Left-sided weakness EXAM: CT HEAD WITHOUT CONTRAST TECHNIQUE: Contiguous axial images were obtained from the base of the skull through the vertex without intravenous contrast. COMPARISON:  Head CT 05/14/2015 FINDINGS: Brain: No mass lesion, intraparenchymal hemorrhage or extra-axial collection. No evidence of acute cortical infarct. There is periventricular hypoattenuation compatible with chronic microvascular disease. Vascular: No hyperdense vessel or unexpected calcification. Skull: Normal visualized skull base, calvarium and  extracranial soft tissues. Sinuses/Orbits: No sinus fluid levels or advanced mucosal thickening. No mastoid effusion. Normal orbits. ASPECTS [facility] Stroke Program Early CT Score) - Ganglionic level infarction (caudate, lentiform nuclei, internal capsule, insula, M1-M3 cortex): 7 - Supraganglionic infarction (M4-M6 cortex): 3 Total score (0-10 with 10 being normal): 10 IMPRESSION: 1. 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 2. ASPECTS is 10. Electronically Signed: By: Ulyses Jarred M.D. On: 08/09/2016 23:47    EKG: Independently reviewed. Sinus rhythm, PVC's  Assessment/Plan  1. Acute left-sided weakness  - Pt presents with acute left-sided weakness, improving on arrival  - Head CT negative for acute intracranial abnormality; CTA head and neck negative for emergent large-vessel occlusion - Neurology is consulting and much appreciated  - Observe on telemetry with frequent neuro checks, PT/OT/SLP evals  - Obtain MRI brain, MRA head, echocardiogram, fasting lipid panel, and A1c  - ASA  was given PTA, will continue  - NPO pending swallow screen or SLP eval   2. 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 Lumbar spinal stenosis  - Pt is status-post decompression in January 2018 and reports improvement since then  - Current LLE weakness likely secondary to cerebral infarct, managed as above   4. 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 Rheumatoid arthritis  - Stable  - Continue current management with leflunomide and and low-dose prednisone     DVT prophylaxis: sq Lovenox  Code Status: Full  Family Communication: Wife updated at bedside Disposition Plan: Observe on telemetry Consults called: Neurology Admission status: Observation    Vianne Bulls, MD [facility] Pager [phone]  If 7PM-7AM, please contact night-coverage [email or web address] Password [email or web address]  08/10/2016, 1:16 AM

## 2016-08-10 NOTE — Progress Notes (Signed)
VASCULAR LAB PRELIMINARY  PRELIMINARY  PRELIMINARY  PRELIMINARY  Bilateral lower extremity venous duplex completed.    Preliminary report:  There is no DVT or SVT noted in the bilateral lower extremities.   Havard Radigan, RVT 08/10/2016, 11:18 AM

## 2016-08-10 NOTE — Progress Notes (Signed)
   08/10/16 0001  Step #1 Pre-Swallow Screen  History of dysphagia No  Dysphagia diet prior to admission No  Awake and alert, responding to speech? Yes  Positioned upright, with some head control? Yes  Cough on command? Yes  Maintain control of their saliva? Yes  Lick top and bottom lip? Yes  Breathe freely and maintain O2 sats? Yes  Voice quality clear (No "wet" gurgly or hoarse sounds) Yes  R Upper  Breath Sounds Clear  Step #2 Swallow Screen  Patient was kept strictly NPO prior to this screen Yes  Water via cup No problems  Water via straw No problems  Cracker No problems  Lung sounds changed after swallow screen No  Passed swallow screen Yes, see row information   Patient passed stroke swallow screen prior to arrival to unit. Patient kept NPO until AM lab specimens obtained. RN paged neurology to inform MD of patients MRI/MRA results and appropriate diet for patient. RN will continue to monitor.

## 2016-08-10 NOTE — ED Notes (Signed)
Patient attempting to use bed pan.

## 2016-08-11 ENCOUNTER — Inpatient Hospital Stay (HOSPITAL_COMMUNITY)
Admission: RE | Admit: 2016-08-11 | Discharge: 2016-08-17 | DRG: 092 | Disposition: A | Discharge status: Home / Self Care | Payer: PPO | Source: Intra-hospital | Attending: Physical Medicine & Rehabilitation | Admitting: Physical Medicine & Rehabilitation

## 2016-08-11 ENCOUNTER — Inpatient Hospital Stay (HOSPITAL_COMMUNITY): Payer: PPO

## 2016-08-11 ENCOUNTER — Encounter (HOSPITAL_COMMUNITY): Payer: Self-pay

## 2016-08-11 DIAGNOSIS — I69398 Other sequelae of cerebral infarction: Secondary | ICD-10-CM

## 2016-08-11 DIAGNOSIS — Z881 Allergy status to other antibiotic agents status: Secondary | ICD-10-CM | POA: Diagnosis not present

## 2016-08-11 DIAGNOSIS — M05712 Rheumatoid arthritis with rheumatoid factor of left shoulder without organ or systems involvement: Secondary | ICD-10-CM | POA: Diagnosis not present

## 2016-08-11 DIAGNOSIS — Z981 Arthrodesis status: Secondary | ICD-10-CM | POA: Diagnosis not present

## 2016-08-11 DIAGNOSIS — D509 Iron deficiency anemia, unspecified: Secondary | ICD-10-CM | POA: Diagnosis present

## 2016-08-11 DIAGNOSIS — Z79899 Other long term (current) drug therapy: Secondary | ICD-10-CM | POA: Diagnosis not present

## 2016-08-11 DIAGNOSIS — K90829 Short bowel syndrome, unspecified: Secondary | ICD-10-CM | POA: Diagnosis present

## 2016-08-11 DIAGNOSIS — Z880 Allergy status to penicillin: Secondary | ICD-10-CM | POA: Diagnosis not present

## 2016-08-11 DIAGNOSIS — R269 Unspecified abnormalities of gait and mobility: Secondary | ICD-10-CM

## 2016-08-11 DIAGNOSIS — R2689 Other abnormalities of gait and mobility: Principal | ICD-10-CM | POA: Diagnosis present

## 2016-08-11 DIAGNOSIS — I69354 Hemiplegia and hemiparesis following cerebral infarction affecting left non-dominant side: Secondary | ICD-10-CM | POA: Diagnosis not present

## 2016-08-11 DIAGNOSIS — I63511 Cerebral infarction due to unspecified occlusion or stenosis of right middle cerebral artery: Secondary | ICD-10-CM

## 2016-08-11 DIAGNOSIS — I480 Paroxysmal atrial fibrillation: Secondary | ICD-10-CM

## 2016-08-11 DIAGNOSIS — N400 Enlarged prostate without lower urinary tract symptoms: Secondary | ICD-10-CM | POA: Diagnosis present

## 2016-08-11 DIAGNOSIS — Z9049 Acquired absence of other specified parts of digestive tract: Secondary | ICD-10-CM

## 2016-08-11 DIAGNOSIS — E8809 Other disorders of plasma-protein metabolism, not elsewhere classified: Secondary | ICD-10-CM | POA: Diagnosis present

## 2016-08-11 DIAGNOSIS — Z7952 Long term (current) use of systemic steroids: Secondary | ICD-10-CM | POA: Diagnosis not present

## 2016-08-11 DIAGNOSIS — Z8249 Family history of ischemic heart disease and other diseases of the circulatory system: Secondary | ICD-10-CM | POA: Diagnosis not present

## 2016-08-11 DIAGNOSIS — Z96653 Presence of artificial knee joint, bilateral: Secondary | ICD-10-CM | POA: Diagnosis present

## 2016-08-11 DIAGNOSIS — K912 Postsurgical malabsorption, not elsewhere classified: Secondary | ICD-10-CM | POA: Diagnosis present

## 2016-08-11 DIAGNOSIS — Z885 Allergy status to narcotic agent status: Secondary | ICD-10-CM

## 2016-08-11 DIAGNOSIS — I639 Cerebral infarction, unspecified: Secondary | ICD-10-CM | POA: Diagnosis present

## 2016-08-11 DIAGNOSIS — M069 Rheumatoid arthritis, unspecified: Secondary | ICD-10-CM | POA: Diagnosis present

## 2016-08-11 DIAGNOSIS — Z9079 Acquired absence of other genital organ(s): Secondary | ICD-10-CM | POA: Diagnosis not present

## 2016-08-11 DIAGNOSIS — M810 Age-related osteoporosis without current pathological fracture: Secondary | ICD-10-CM | POA: Diagnosis present

## 2016-08-11 DIAGNOSIS — E785 Hyperlipidemia, unspecified: Secondary | ICD-10-CM | POA: Diagnosis present

## 2016-08-11 DIAGNOSIS — I63512 Cerebral infarction due to unspecified occlusion or stenosis of left middle cerebral artery: Secondary | ICD-10-CM | POA: Diagnosis not present

## 2016-08-11 DIAGNOSIS — I6789 Other cerebrovascular disease: Secondary | ICD-10-CM

## 2016-08-11 DIAGNOSIS — K219 Gastro-esophageal reflux disease without esophagitis: Secondary | ICD-10-CM | POA: Diagnosis present

## 2016-08-11 DIAGNOSIS — G8191 Hemiplegia, unspecified affecting right dominant side: Secondary | ICD-10-CM | POA: Diagnosis not present

## 2016-08-11 DIAGNOSIS — M05711 Rheumatoid arthritis with rheumatoid factor of right shoulder without organ or systems involvement: Secondary | ICD-10-CM | POA: Diagnosis not present

## 2016-08-11 LAB — ECHOCARDIOGRAM COMPLETE
AVLVOTPG: 6 mmHg
E decel time: 335 msec
E/e' ratio: 12.02
FS: 28 % (ref 28–44)
Height: 67 in
IVS/LV PW RATIO, ED: 1.4
LA diam end sys: 39 mm
LA vol: 80.3 mL
LADIAMINDEX: 1.98 cm/m2
LASIZE: 39 mm
LAVOLA4C: 85.8 mL
LAVOLIN: 40.8 mL/m2
LDCA: 3.46 cm2
LV TDI E'MEDIAL: 4.5
LV e' LATERAL: 5.25 cm/s
LVEEAVG: 12.02
LVEEMED: 12.02
LVOT SV: 83 mL
LVOT VTI: 23.9 cm
LVOT diameter: 21 mm
LVOT peak vel: 120 cm/s
Lateral S' vel: 21.4 cm/s
MV Dec: 335
MV pk A vel: 87.8 m/s
MVPKEVEL: 63.1 m/s
PW: 10.3 mm — AB (ref 0.6–1.1)
RV sys press: 25 mmHg
Reg peak vel: 234 cm/s
TAPSE: 28.6 mm
TDI e' lateral: 5.25
TR max vel: 234 cm/s
Weight: 2843.2 oz

## 2016-08-11 LAB — CBC
HEMATOCRIT: 33.3 % — AB (ref 39.0–52.0)
HEMOGLOBIN: 9.7 g/dL — AB (ref 13.0–17.0)
MCH: 22 pg — ABNORMAL LOW (ref 26.0–34.0)
MCHC: 29.1 g/dL — ABNORMAL LOW (ref 30.0–36.0)
MCV: 75.7 fL — ABNORMAL LOW (ref 78.0–100.0)
Platelets: 243 10*3/uL (ref 150–400)
RBC: 4.4 MIL/uL (ref 4.22–5.81)
RDW: 16.5 % — AB (ref 11.5–15.5)
WBC: 7.8 10*3/uL (ref 4.0–10.5)

## 2016-08-11 LAB — CREATININE, SERUM: Creatinine, Ser: 1.1 mg/dL (ref 0.61–1.24)

## 2016-08-11 LAB — HEMOGLOBIN A1C
Hgb A1c MFr Bld: 5.8 % — ABNORMAL HIGH (ref 4.8–5.6)
MEAN PLASMA GLUCOSE: 120 mg/dL

## 2016-08-11 MED ORDER — SENNOSIDES-DOCUSATE SODIUM 8.6-50 MG PO TABS: 1.0000 | ORAL_TABLET | Freq: Every evening | ORAL | Status: DC | PRN

## 2016-08-11 MED ORDER — PANTOPRAZOLE SODIUM 40 MG PO TBEC
40.0000 mg | DELAYED_RELEASE_TABLET | Freq: Every day | ORAL | Status: DC
  Administered 2016-08-12 – 2016-08-17 (×6): 40 mg via ORAL
  Filled 2016-08-11 (×6): qty 1

## 2016-08-11 MED ORDER — ACETAMINOPHEN 160 MG/5ML PO SOLN: 650.0000 mg | ORAL | Status: DC | PRN

## 2016-08-11 MED ORDER — ENOXAPARIN SODIUM 40 MG/0.4ML ~~LOC~~ SOLN: 40.0000 mg | SUBCUTANEOUS | Status: DC

## 2016-08-11 MED ORDER — ASPIRIN 300 MG RE SUPP: 300.0000 mg | Freq: Every day | RECTAL | Status: DC

## 2016-08-11 MED ORDER — PREDNISONE 10 MG PO TABS
5.0000 mg | ORAL_TABLET | Freq: Every day | ORAL | Status: DC
  Administered 2016-08-12 – 2016-08-17 (×6): 5 mg via ORAL
  Filled 2016-08-11 (×6): qty 1

## 2016-08-11 MED ORDER — SORBITOL 70 % SOLN: 30.0000 mL | Freq: Every day | Status: DC | PRN

## 2016-08-11 MED ORDER — ACETAMINOPHEN 325 MG PO TABS
650.0000 mg | ORAL_TABLET | ORAL | Status: DC | PRN
  Administered 2016-08-11 – 2016-08-16 (×8): 650 mg via ORAL
  Filled 2016-08-11 (×9): qty 2

## 2016-08-11 MED ORDER — ACETAMINOPHEN 650 MG RE SUPP: 650.0000 mg | RECTAL | Status: DC | PRN

## 2016-08-11 MED ORDER — NAPHAZOLINE-GLYCERIN 0.012-0.2 % OP SOLN: 2.0000 [drp] | Freq: Four times a day (QID) | OPHTHALMIC | Status: DC | PRN

## 2016-08-11 MED ORDER — ONDANSETRON HCL 4 MG PO TABS: 4.0000 mg | ORAL_TABLET | Freq: Four times a day (QID) | ORAL | Status: DC | PRN

## 2016-08-11 MED ORDER — ATORVASTATIN CALCIUM 80 MG PO TABS: 40.0000 mg | ORAL_TABLET | Freq: Every day | ORAL | 12 refills | Status: DC

## 2016-08-11 MED ORDER — ATORVASTATIN CALCIUM 40 MG PO TABS
40.0000 mg | ORAL_TABLET | Freq: Every day | ORAL | Status: DC
  Administered 2016-08-12 – 2016-08-16 (×5): 40 mg via ORAL
  Filled 2016-08-11 (×5): qty 1

## 2016-08-11 MED ORDER — ASPIRIN 325 MG PO TABS
325.0000 mg | ORAL_TABLET | Freq: Every day | ORAL | Status: DC
  Administered 2016-08-12: 325 mg via ORAL
  Filled 2016-08-11: qty 1

## 2016-08-11 MED ORDER — LEFLUNOMIDE 20 MG PO TABS
20.0000 mg | ORAL_TABLET | Freq: Every day | ORAL | Status: DC
  Administered 2016-08-12 – 2016-08-17 (×6): 20 mg via ORAL
  Filled 2016-08-11 (×6): qty 1

## 2016-08-11 MED ORDER — TRAMADOL HCL 50 MG PO TABS
50.0000 mg | ORAL_TABLET | Freq: Four times a day (QID) | ORAL | Status: DC | PRN
  Administered 2016-08-11 – 2016-08-16 (×8): 50 mg via ORAL
  Filled 2016-08-11 (×10): qty 1

## 2016-08-11 MED ORDER — ONDANSETRON HCL 4 MG/2ML IJ SOLN: 4.0000 mg | Freq: Four times a day (QID) | INTRAMUSCULAR | Status: DC | PRN

## 2016-08-11 MED ORDER — APIXABAN 5 MG PO TABS: 5.0000 mg | ORAL_TABLET | Freq: Two times a day (BID) | ORAL | 2 refills | Status: DC

## 2016-08-11 NOTE — Progress Notes (Signed)
Discharged PT to CIR per MD order. Iv remaining per accepting RN. All belongings transferred. 1800 meds given. Patient to [phone].  Cyndia Bent RN

## 2016-08-11 NOTE — Progress Notes (Signed)
Inpatient Rehabilitation  Met with patient and family to discuss team's recommendation for IP Rehab.  Shared booklets and answered questions.  I have initiated insurance authorization and have beds available today and tomorrow.  Plan to follow along for timing of medical readiness and insurance authorization.  Please call with questions.   Carmelia Roller., CCC/SLP Admission Coordinator  [place]  Cell [phone]

## 2016-08-11 NOTE — Progress Notes (Signed)
  Echocardiogram 2D Echocardiogram has been performed.  [name] 08/11/2016, 10:12 AM

## 2016-08-11 NOTE — Consult Note (Signed)
ELECTROPHYSIOLOGY CONSULT NOTE  Patient ID: [name] MRN: [id number], DOB/AGE: 04/29/1940   Admit date: 08/09/2016 Date of Consult: 08/11/2016  Primary Physician: [name], MD Primary Cardiologist: new to [facility] Reason for Consultation: Cryptogenic stroke; recommendations regarding Implantable Loop Recorder  History of Present Illness [name] is a 76 y.o. male that EP has been asked to consult on for consideration of implantable loop recorder by Dr Erlinda Hong.  He was admitted on 08/09/2016 with left sided weakness.  They first developed symptoms after sneezing.  Imaging demonstrated bilateral infarcts involving right MCA/PCA, MCA, left MCA/ACA infarcts felt to be embolic 2/2 unknown source.  He is undergoing workup for stroke. The patient has been monitored on telemetry which has demonstrated sinus rhythm with no arrhythmias.  Inpatient stroke work-up is to be completed with a TEE.   Echocardiogram this admission is pending.  Lab work is reviewed.  Prior to admission, the patient denies chest pain, shortness of breath, dizziness, palpitations, or syncope.  They are recovering from their stroke with plans to go to CIR at discharge.  EP has been asked to evaluate for placement of an implantable loop recorder to monitor for atrial fibrillation.  Past Medical History:  Diagnosis Date  . Blood transfusion    " no reaction from transfusion"  . BPH (benign prostatic hyperplasia)   . Bruises easily   . Bursitis    chronic hip pain  . Cancer [facility])    prostate cancer cells  . Disc disease, degenerative, lumbar or lumbosacral    Chronic back pain now  . Diverticulitis   . Esophageal ring    requiring dilations   . GERD (gastroesophageal reflux disease)   . History of GI bleed    "vessels burst in colon" x2  . History of kidney stones    chronic  . History of vertigo   . Osteoarthritis   . Osteoporosis   . Pneumonia    yrs ago  . Rash    GROIN - ITCHING PAST 3  MONTHS - it is gone now 05/02/16  . Rheumatoid arthritis(714.0)    27 years  . Spondylisthesis   . Ureteral stone 11/2012     Surgical History:  Past Surgical History:  Procedure Laterality Date  . BALLOON DILATION N/A 10/01/2012   Procedure: BALLOON DILATION;  Surgeon: Garlan Fair, MD;  Location: [facility];  Service: Endoscopy;  Laterality: N/A;  . COLECTOMY N/A 06/10/2013   Procedure: TOTAL abdominal COLECTOMY;  Surgeon: Odis Hollingshead, MD;  Location: [facility];  Service: General;  Laterality: N/A;  . COLONOSCOPY    . CYSTOSCOPY WITH URETEROSCOPY Right 12/05/2012   Procedure: CYSTOSCOPY WITH RIGHT URETEROSCOPY AND STONE EXTRACTION;  Surgeon: Malka So, MD;  Location: [facility];  Service: Urology;  Laterality: Right;  . ESOPHAGOGASTRODUODENOSCOPY N/A 10/01/2012   Procedure: ESOPHAGOGASTRODUODENOSCOPY (EGD);  Surgeon: Garlan Fair, MD;  Location: [facility];  Service: Endoscopy;  Laterality: N/A;  . HERNIA REPAIR    . INGUINAL HERNIA REPAIR  2007   Bilateral   . Lithotripsy for nephrolithiasis     Pt and wife not sure when but here in [place]  . Lysis of Adhesion, small bowel resection  2009  . MAXIMUM ACCESS (MAS)POSTERIOR LUMBAR INTERBODY FUSION (PLIF) 1 LEVEL N/A 05/08/2016   Procedure: LUMBAR FOUR-FIVE FOR MAXIMUM ACCESS (MAS) POSTERIOR LUMBAR INTERBODY FUSION;  Surgeon: Kary Kos, MD;  Location: [facility];  Service: Neurosurgery;  Laterality: N/A;  . NISSEN FUNDOPLICATION  about 2005, pt not sure records not currently available  . TOTAL KNEE ARTHROPLASTY  2003   Bilateral  . TRANSURETHRAL PROSTATECTOMY WITH GYRUS INSTRUMENTS N/A 12/05/2012   Procedure: TRANSURETHRAL PROSTATECTOMY WITH GYRUS INSTRUMENTS;  Surgeon: Malka So, MD;  Location: [facility];  Service: Urology;  Laterality: N/A;  . Ventral Hernia Repair with Mesh       Prescriptions Prior to Admission  Medication Sig Dispense Refill Last Dose  . acetaminophen (TYLENOL) 650 MG CR tablet Take 650-1,300 mg by  mouth 2 (two) times daily. 1300 mg in the morning & 650 mg at night   08/09/2016 at Unknown time  . diphenhydramine-acetaminophen (TYLENOL PM) 25-500 MG TABS tablet Take 1 tablet by mouth at bedtime.   08/09/2016 at Unknown time  . leflunomide (ARAVA) 20 MG tablet Take 20 mg by mouth daily.    08/09/2016 at Unknown time  . omeprazole (PRILOSEC) 20 MG capsule Take 20 mg by mouth daily.     08/09/2016 at Unknown time  . predniSONE (DELTASONE) 5 MG tablet Take 5 mg by mouth daily.   08/09/2016 at Unknown time  . tetrahydrozoline-zinc (VISINE-AC) 0.05-0.25 % ophthalmic solution Place 2 drops into both eyes 3 (three) times daily as needed (for allergies).   unk  . traMADol (ULTRAM) 50 MG tablet Take 50 mg by mouth 2 (two) times daily.   08/09/2016 at Unknown time    Inpatient Medications:  . aspirin  300 mg Rectal Daily   Or  . aspirin  325 mg Oral Daily  . atorvastatin  40 mg Oral q1800  . enoxaparin (LOVENOX) injection  40 mg Subcutaneous Q24H  . leflunomide  20 mg Oral Daily  . pantoprazole  40 mg Oral Daily  . predniSONE  5 mg Oral Daily    Allergies:  Allergies  Allergen Reactions  . Clindamycin/Lincomycin Hives, Shortness Of Breath and Swelling  . Enbrel [Etanercept] Swelling    SEVERE LEG SWELLING   . Penicillins Hives and Shortness Of Breath    Has patient had a PCN reaction causing immediate rash, facial/tongue/throat swelling, SOB or lightheadedness with hypotension: Yes Has patient had a PCN reaction causing severe rash involving mucus membranes or skin necrosis: Yes Has patient had a PCN reaction that required hospitalization No Has patient had a PCN reaction occurring within the last 10 years: No If all of the above answers are "NO", then may proceed with Cephalosporin use.   . Codeine Hives and Swelling    Social History   Social History  . Marital status: Married    Spouse name: [name]  . Number of children: 2  . Years of education: 12   Occupational History  .       Chief Financial Officer, retired   Social History Main Topics  . Smoking status: Never Smoker  . Smokeless tobacco: Never Used  . Alcohol use No  . Drug use: No  . Sexual activity: Not Currently   Other Topics Concern  . Not on file   Social History Narrative   Lives at home with wife   Caffeine - Cokes, 1-2 weekly; coffee, 2-3 cups daily in winter     Family History  Problem Relation Age of Onset  . Adopted: Yes  . Heart failure Mother   . Heart failure Father       Review of Systems: All other systems reviewed and are otherwise negative except as noted above.  Physical Exam: Vitals:   08/10/16 1748 08/10/16 2120 08/11/16 0210 08/11/16  0520  BP: (!) 158/67 (!) 190/91 (!) 178/85 (!) 174/73  Pulse: 70 64 62 61  Resp: 20 20 20 20   Temp: 98 F (36.7 C) 97.4 F (36.3 C) 97.6 F (36.4 C) 98.2 F (36.8 C)  TempSrc: Oral Oral Oral Oral  SpO2: 96% 95% 96% 97%  Weight:      Height:        GEN- The patient is elderly appearing, alert and oriented x 3 today.   Head- normocephalic, atraumatic Eyes-  Sclera clear, conjunctiva pink Ears- hearing intact Oropharynx- clear Neck- supple Lungs- Clear to ausculation bilaterally, normal work of breathing Heart- Regular rate and rhythm, no murmurs, rubs or gallops  GI- soft, NT, ND, + BS Extremities- no clubbing, cyanosis, or edema MS- no significant deformity or atrophy Skin- no rash or lesion Psych- euthymic mood, full affect   Labs:   Lab Results  Component Value Date   WBC 8.8 08/09/2016   HGB 11.9 (L) 08/09/2016   HCT 35.0 (L) 08/09/2016   MCV 76.4 (L) 08/09/2016   PLT 264 08/09/2016    Recent Labs Lab 08/09/16 2322 08/09/16 2327  NA 138 140  K 3.5 3.5  CL 108 106  CO2 24  --   BUN 14 17  CREATININE 1.10 1.00  CALCIUM 8.6*  --   PROT 7.0  --   BILITOT 0.5  --   ALKPHOS 83  --   ALT 11*  --   AST 19  --   GLUCOSE 104* 101*    Radiology/Studies: Ct Angio Head W Or Wo Contrast  Addendum Date:  08/10/2016   ADDENDUM REPORT: 08/10/2016 03:59 ADDENDUM: There is a transcription error in the 1st impression point. This should read, "No emergent large vessel occlusion." Electronically Signed   By: Ulyses Jarred M.D.   On: 08/10/2016 03:59   Result Date: 08/10/2016 CLINICAL DATA:  Left-sided weakness EXAM: CT ANGIOGRAPHY HEAD AND NECK TECHNIQUE: Multidetector CT imaging of the head and neck was performed using the standard protocol during bolus administration of intravenous contrast. Multiplanar CT image reconstructions and MIPs were obtained to evaluate the vascular anatomy. Carotid stenosis measurements (when applicable) are obtained utilizing NASCET criteria, using the distal internal carotid diameter as the denominator. CONTRAST:  50 mL Isovue 370 COMPARISON:  Head CT 08/09/2016 FINDINGS: CTA NECK FINDINGS Aortic arch: There is no aneurysm or dissection of the visualized ascending aorta or aortic arch. There is a normal 3 vessel branching pattern. The visualized proximal subclavian arteries are normal. Right carotid system: The right common carotid origin is widely patent. There is no common carotid or internal carotid artery dissection or aneurysm. No hemodynamically significant stenosis. Mild atherosclerotic calcification of the proximal internal carotid artery. Or Left carotid system: The left common carotid origin is widely patent. There is no common carotid or internal carotid artery dissection or aneurysm. No hemodynamically significant stenosis. Vertebral arteries: The vertebral system is codominant. There is severe narrowing of the left vertebral artery origin due to atherosclerotic calcification. Both vertebral arteries are otherwise normal to their confluence with the basilar artery. Skeleton: There is no bony spinal canal stenosis. No lytic or blastic lesions. Other neck: The nasopharynx is clear. The oropharynx and hypopharynx are normal. The epiglottis is normal. The supraglottic larynx,  glottis and subglottic larynx are normal. No retropharyngeal collection. The parapharyngeal spaces are preserved. The parotid and submandibular glands are normal. No sialolithiasis or salivary ductal dilatation. The thyroid gland is normal. There is no cervical lymphadenopathy. Upper  chest: No pneumothorax or pleural effusion. No nodules or masses. Review of the MIP images confirms the above findings CTA HEAD FINDINGS Anterior circulation: --Intracranial internal carotid arteries: Normal. --Anterior cerebral arteries: Normal. --Middle cerebral arteries: Normal. --Posterior communicating arteries: Diminutive bilaterally Posterior circulation: --Posterior cerebral arteries: Normal. --Superior cerebellar arteries: Normal. --Basilar artery: Normal. --Anterior inferior cerebellar arteries: Normal. --Posterior inferior cerebellar arteries: Normal. Venous sinuses: As permitted by contrast timing, patent. Anatomic variants: None Delayed phase: Not performed. Review of the MIP images confirms the above findings IMPRESSION: 1. Narrowing emergent intracranial large vessel occlusion. 2. 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 3. Otherwise, no hemodynamically significant stenosis of the major cervical arteries. Electronically Signed: By: Ulyses Jarred M.D. On: 08/10/2016 00:04   Ct Angio Neck W And/or Wo Contrast  Addendum Date: 08/10/2016   ADDENDUM REPORT: 08/10/2016 03:59 ADDENDUM: There is a transcription error in the 1st impression point. This should read, "No emergent large vessel occlusion." Electronically Signed   By: Ulyses Jarred M.D.   On: 08/10/2016 03:59   Result Date: 08/10/2016 CLINICAL DATA:  Left-sided weakness EXAM: CT ANGIOGRAPHY HEAD AND NECK TECHNIQUE: Multidetector CT imaging of the head and neck was performed using the standard protocol during bolus administration of intravenous contrast. Multiplanar CT image reconstructions and MIPs were obtained to  evaluate the vascular anatomy. Carotid stenosis measurements (when applicable) are obtained utilizing NASCET criteria, using the distal internal carotid diameter as the denominator. CONTRAST:  50 mL Isovue 370 COMPARISON:  Head CT 08/09/2016 FINDINGS: CTA NECK FINDINGS Aortic arch: There is no aneurysm or dissection of the visualized ascending aorta or aortic arch. There is a normal 3 vessel branching pattern. The visualized proximal subclavian arteries are normal. Right carotid system: The right common carotid origin is widely patent. There is no common carotid or internal carotid artery dissection or aneurysm. No hemodynamically significant stenosis. Mild atherosclerotic calcification of the proximal internal carotid artery. Or Left carotid system: The left common carotid origin is widely patent. There is no common carotid or internal carotid artery dissection or aneurysm. No hemodynamically significant stenosis. Vertebral arteries: The vertebral system is codominant. There is severe narrowing of the left vertebral artery origin due to atherosclerotic calcification. Both vertebral arteries are otherwise normal to their confluence with the basilar artery. Skeleton: There is no bony spinal canal stenosis. No lytic or blastic lesions. Other neck: The nasopharynx is clear. The oropharynx and hypopharynx are normal. The epiglottis is normal. The supraglottic larynx, glottis and subglottic larynx are normal. No retropharyngeal collection. The parapharyngeal spaces are preserved. The parotid and submandibular glands are normal. No sialolithiasis or salivary ductal dilatation. The thyroid gland is normal. There is no cervical lymphadenopathy. Upper chest: No pneumothorax or pleural effusion. No nodules or masses. Review of the MIP images confirms the above findings CTA HEAD FINDINGS Anterior circulation: --Intracranial internal carotid arteries: Normal. --Anterior cerebral arteries: Normal. --Middle cerebral arteries:  Normal. --Posterior communicating arteries: Diminutive bilaterally Posterior circulation: --Posterior cerebral arteries: Normal. --Superior cerebellar arteries: Normal. --Basilar artery: Normal. --Anterior inferior cerebellar arteries: Normal. --Posterior inferior cerebellar arteries: Normal. Venous sinuses: As permitted by contrast timing, patent. Anatomic variants: None Delayed phase: Not performed. Review of the MIP images confirms the above findings IMPRESSION: 1. Narrowing emergent intracranial large vessel occlusion. 2. 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 3. Otherwise, no hemodynamically significant stenosis of the major cervical arteries. Electronically Signed: By: Ulyses Jarred M.D. On: 08/10/2016 00:04  Mr Brain Wo Contrast  Result Date: 08/10/2016 CLINICAL DATA:  Acute left-sided weakness EXAM: MRI HEAD WITHOUT CONTRAST MRA HEAD WITHOUT CONTRAST TECHNIQUE: Multiplanar, multiecho pulse sequences of the brain and surrounding structures were obtained without intravenous contrast. Angiographic images of the head were obtained using MRA technique without contrast. COMPARISON:  CTA head and neck 08/09/2016 FINDINGS: MRI HEAD FINDINGS Brain: The midline structures are normal. There is focal diffusion restriction within the right caudate tail of and extending inferiorly to involve the posterior limb of the right internal capsule. There is no hemorrhage. No mass effect. There are additional punctate foci of diffusion restriction within the left parietal lobe and right occipital lobe. There is beginning confluent hyperintense T2-weighted signal within the periventricular white matter, most often seen in the setting of chronic microvascular ischemia. No mass lesion. No chronic microhemorrhage or cerebral amyloid angiopathy. No hydrocephalus, age advanced atrophy or lobar predominant volume loss. No dural abnormality or extra-axial collection. Skull and upper  cervical spine: The visualized skull base, calvarium, upper cervical spine and extracranial soft tissues are normal. Sinuses/Orbits: No fluid levels or advanced mucosal thickening. No mastoid effusion. Normal orbits. MRA HEAD FINDINGS Intracranial internal carotid arteries: Normal. Anterior cerebral arteries: Normal. Middle cerebral arteries: Normal. Posterior communicating arteries: Present bilaterally. Posterior cerebral arteries: Normal. Basilar artery: Normal. Vertebral arteries: Left dominant. Normal. Superior cerebellar arteries: Normal. Anterior inferior cerebellar arteries: Not clearly seen, which is not uncommon. Posterior inferior cerebellar arteries: Normal. IMPRESSION: 1. Acute ischemia of the right caudate tail extending to the posterior limb of the right internal capsule, in keeping with acute left-sided weakness. 2. Punctate foci of acute ischemia within the left parietal lobe and at the right temporal occipital junction. The distribution of lesions suggests a central cardioembolic process. 3. No hemorrhage or mass effect. 4. Chronic microvascular ischemia. 5. Normal intracranial MRA. Electronically Signed   By: Ulyses Jarred M.D.   On: 08/10/2016 04:10   Mr Jodene Nam Head/brain BD Cm  Result Date: 08/10/2016 CLINICAL DATA:  Acute left-sided weakness EXAM: MRI HEAD WITHOUT CONTRAST MRA HEAD WITHOUT CONTRAST TECHNIQUE: Multiplanar, multiecho pulse sequences of the brain and surrounding structures were obtained without intravenous contrast. Angiographic images of the head were obtained using MRA technique without contrast. COMPARISON:  CTA head and neck 08/09/2016 FINDINGS: MRI HEAD FINDINGS Brain: The midline structures are normal. There is focal diffusion restriction within the right caudate tail of and extending inferiorly to involve the posterior limb of the right internal capsule. There is no hemorrhage. No mass effect. There are additional punctate foci of diffusion restriction within the left  parietal lobe and right occipital lobe. There is beginning confluent hyperintense T2-weighted signal within the periventricular white matter, most often seen in the setting of chronic microvascular ischemia. No mass lesion. No chronic microhemorrhage or cerebral amyloid angiopathy. No hydrocephalus, age advanced atrophy or lobar predominant volume loss. No dural abnormality or extra-axial collection. Skull and upper cervical spine: The visualized skull base, calvarium, upper cervical spine and extracranial soft tissues are normal. Sinuses/Orbits: No fluid levels or advanced mucosal thickening. No mastoid effusion. Normal orbits. MRA HEAD FINDINGS Intracranial internal carotid arteries: Normal. Anterior cerebral arteries: Normal. Middle cerebral arteries: Normal. Posterior communicating arteries: Present bilaterally. Posterior cerebral arteries: Normal. Basilar artery: Normal. Vertebral arteries: Left dominant. Normal. Superior cerebellar arteries: Normal. Anterior inferior cerebellar arteries: Not clearly seen, which is not uncommon. Posterior inferior cerebellar arteries: Normal. IMPRESSION: 1. Acute ischemia of the right caudate tail extending to the posterior limb of the right  internal capsule, in keeping with acute left-sided weakness. 2. Punctate foci of acute ischemia within the left parietal lobe and at the right temporal occipital junction. The distribution of lesions suggests a central cardioembolic process. 3. No hemorrhage or mass effect. 4. Chronic microvascular ischemia. 5. Normal intracranial MRA. Electronically Signed   By: Ulyses Jarred M.D.   On: 08/10/2016 04:10   Ct Head Code Stroke W/o Cm  Addendum Date: 08/10/2016   ADDENDUM REPORT: 08/10/2016 00:13 ADDENDUM: These results were called by telephone at the time of interpretation on 08/10/2016 at 11:58 p.m. to Dr. Kerney Elbe, who verbally acknowledged these results. Electronically Signed   By: Ulyses Jarred M.D.   On: 08/10/2016 00:13    Result Date: 08/10/2016 CLINICAL DATA:  Code stroke.  Left-sided weakness EXAM: CT HEAD WITHOUT CONTRAST TECHNIQUE: Contiguous axial images were obtained from the base of the skull through the vertex without intravenous contrast. COMPARISON:  Head CT 05/14/2015 FINDINGS: Brain: No mass lesion, intraparenchymal hemorrhage or extra-axial collection. No evidence of acute cortical infarct. There is periventricular hypoattenuation compatible with chronic microvascular disease. Vascular: No hyperdense vessel or unexpected calcification. Skull: Normal visualized skull base, calvarium and extracranial soft tissues. Sinuses/Orbits: No sinus fluid levels or advanced mucosal thickening. No mastoid effusion. Normal orbits. ASPECTS [facility] Stroke Program Early CT Score) - Ganglionic level infarction (caudate, lentiform nuclei, internal capsule, insula, M1-M3 cortex): 7 - Supraganglionic infarction (M4-M6 cortex): 3 Total score (0-10 with 10 being normal): 10 IMPRESSION: 1. 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 2. ASPECTS is 10. Electronically Signed: By: Ulyses Jarred M.D. On: 08/09/2016 23:47    12-lead ECG sinus rhythm EKG from 2012 reviewed which demonstrates atrial fibrillation All prior EKG's in EPIC reviewed with no documented atrial fibrillation  Telemetry sinus rhythm with PAC's  Assessment and Plan:  1. Cryptogenic stroke The patient presents with cryptogenic stroke. Echo is pending. No TEE with significant GI history. EKG from 2012 demonstrates atrial fibrillation - at that time, he does not remember being told he had atrial fibrillation. CHADS score at that time would have been 0 and he was also having significant issues with GI bleeding. As he has had AF documented, he meets criteria for [facility] and does not need ILR. With significant GI bleeding in the past, would defer initiation after GI has cleared. I have discussed with Dr Erlinda Hong today Electrophysiology team to see as  needed while here. Please call with questions.  Chanetta Marshall, NP 08/11/2016 8:29 AM  I have seen, examined the patient, and reviewed the above assessment and plan.  On exam, RRR. Changes to above are made where necessary.  The patient has known atrial fibrillation as documented by EKG 2012.  I would therefore not advise ILR placement.  Given chads2vasc score of 4 and recent stroke, I would advise anticoagulation long term if not contraindicated.  Dr Erlinda Hong is aware and following-up with GI given prior GI bleeding.  Electrophysiology team to see as needed while here. Please call with questions.  Thompson Grayer MD, [facility] 08/11/2016 3:21 PM

## 2016-08-11 NOTE — Progress Notes (Signed)
Physician Discharge Summary  [name] URK:[id number] DOB: [birth year] DOA: 08/09/2016  PCP: [name], MD  Admit date: 08/09/2016 Discharge date: 08/11/2016  Time spent: 35 minutes  Recommendations for Outpatient Follow-up:  1. Continue eliquis 5 bid for 2/2 stroke prevention 2. Needs therapy and modalities at CIR when available 3. Avoid NSAIDs 4. Follow with Dr. 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  Note dprior Afib.   NO need ILR.  Placed on high intensity statin on d/c and appropriate for CIR Discussed with GI Dr. [name] who is okay with patient being on eliquis--will need OP follow up on d/c from CIR prn dr. [name]   Discharge Exam: Vitals:   08/11/16 0520 08/11/16 1041  BP: (!) 174/73 (!) 177/73  Pulse: 61 64  Resp: 20 20  Temp: 98.2 F (36.8 C) 98.3 F (36.8 C)    General: alert pleasant orietned and in  nad Cardiovascular: s1 s2 no m/r/ Respiratory: clear slght defici to L sde in terms of power No past pointing Reflexes equivocal  Discharge Instructions    Current Discharge Medication List    START taking these medications   Details  apixaban (ELIQUIS STARTER PACK) 5 MG TABS tablet Take 1 tablet (5 mg total) by mouth 2 (two) times daily. Qty: 60 tablet, Refills: 2    atorvastatin (LIPITOR) 80 MG tablet Take 0.5 tablets (40 mg total) by mouth daily at 6 PM. Qty: 30 tablet, Refills: 12      CONTINUE these medications which have NOT CHANGED   Details  acetaminophen (TYLENOL) 650 MG CR tablet Take 650-1,300 mg by mouth 2 (two) times daily. 1300 mg in the morning & 650 mg at night    diphenhydramine-acetaminophen (TYLENOL PM) 25-500 MG TABS tablet Take 1 tablet by mouth at bedtime.    leflunomide (ARAVA) 20 MG tablet Take 20 mg by mouth daily.     omeprazole (PRILOSEC) 20 MG capsule Take 20 mg by mouth daily.      predniSONE (DELTASONE) 5 MG tablet Take 5 mg by mouth daily.    tetrahydrozoline-zinc (VISINE-AC) 0.05-0.25 % ophthalmic solution Place 2 drops into both eyes 3 (three) times daily as needed (for allergies).    traMADol (ULTRAM) 50 MG tablet Take 50 mg by mouth 2 (two) times daily.       Allergies  Allergen  Reactions  . Clindamycin/Lincomycin Hives, Shortness Of Breath and Swelling  . Enbrel [Etanercept] Swelling    SEVERE LEG SWELLING   . Penicillins Hives and Shortness Of Breath    Has patient had a PCN reaction causing immediate rash, facial/tongue/throat swelling, SOB or lightheadedness with hypotension: Yes Has patient had a PCN reaction causing severe rash involving mucus membranes or skin necrosis: Yes Has patient had a PCN reaction that required hospitalization No Has patient had a PCN reaction occurring within the last 10 years: No If all of the above answers are "NO", then may proceed with Cephalosporin use.   . Codeine Hives and Swelling    Follow-up Information    [name], MD Follow up.   Specialty:  Internal Medicine Contact information: [address] [place] [address] [phone]        Andrey Spearman, MD Follow up in 6 week(s).   Specialties:  Neurology, Radiology Why:  stroke clinic. office will call with appt date and time Contact information: [address] [place]  [place] [address] [phone]            The results of significant diagnostics from this hospitalization (including imaging, microbiology, ancillary and laboratory) are listed below for reference.    Significant Diagnostic Studies: Ct Angio Head W Or Wo Contrast  Addendum Date: 08/10/2016   ADDENDUM REPORT: 08/10/2016 03:59 ADDENDUM: There is a transcription error in the 1st impression point. This should read, "No emergent large vessel occlusion." Electronically Signed   By: Ulyses Jarred M.D.   On: 08/10/2016 03:59   Result Date: 08/10/2016 CLINICAL DATA:  Left-sided weakness EXAM: CT ANGIOGRAPHY HEAD AND NECK TECHNIQUE: Multidetector CT imaging of the head and neck was performed using the standard protocol during bolus administration of intravenous contrast. Multiplanar CT image reconstructions and MIPs were obtained to evaluate the vascular anatomy. Carotid stenosis measurements (when applicable) are obtained utilizing NASCET criteria, using the distal internal carotid diameter as the denominator. CONTRAST:  50 mL Isovue 370 COMPARISON:  Head CT 08/09/2016 FINDINGS: CTA NECK FINDINGS Aortic arch: There is no aneurysm or dissection of the visualized ascending aorta or aortic arch. There is a normal 3 vessel branching pattern. The visualized proximal subclavian arteries are normal. Right carotid system: The right common carotid origin is widely patent. There is no common carotid or internal carotid artery dissection or aneurysm. No hemodynamically significant stenosis. Mild atherosclerotic calcification  of the proximal internal carotid artery. Or Left carotid system: The left common carotid origin is widely patent. There is no common carotid or internal carotid artery dissection or aneurysm. No hemodynamically significant stenosis. Vertebral arteries: The vertebral system is codominant. There is severe narrowing of the left vertebral artery origin due to atherosclerotic calcification. Both vertebral arteries are otherwise normal to their confluence with the basilar artery. Skeleton: There is no bony spinal canal stenosis. No lytic or blastic lesions. Other neck: The nasopharynx is clear. The oropharynx and hypopharynx are normal. The epiglottis is normal. The supraglottic larynx, glottis and subglottic larynx are normal. No retropharyngeal collection. The parapharyngeal spaces are preserved. The parotid and submandibular glands are normal. No sialolithiasis or salivary ductal dilatation. The thyroid gland is normal. There is no cervical lymphadenopathy. Upper chest: No pneumothorax or pleural effusion. No nodules or masses. Review of the MIP images confirms the above findings CTA HEAD FINDINGS Anterior circulation: --Intracranial internal carotid arteries: Normal. --Anterior cerebral arteries: Normal. --Middle cerebral arteries: Normal. --Posterior communicating arteries: Diminutive bilaterally Posterior circulation: --Posterior cerebral  arteries: Normal. --Superior cerebellar arteries: Normal. --Basilar artery: Normal. --Anterior inferior cerebellar arteries: Normal. --Posterior inferior cerebellar arteries: Normal. Venous sinuses: As permitted by contrast timing, patent. Anatomic variants: None Delayed phase: Not performed. Review of the MIP images confirms the above findings IMPRESSION: 1. Narrowing emergent intracranial large vessel occlusion. 2. 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 3. Otherwise, no hemodynamically significant stenosis of the major cervical  arteries. Electronically Signed: By: Ulyses Jarred M.D. On: 08/10/2016 00:04   Ct Angio Neck W And/or Wo Contrast  Addendum Date: 08/10/2016   ADDENDUM REPORT: 08/10/2016 03:59 ADDENDUM: There is a transcription error in the 1st impression point. This should read, "No emergent large vessel occlusion." Electronically Signed   By: Ulyses Jarred M.D.   On: 08/10/2016 03:59   Result Date: 08/10/2016 CLINICAL DATA:  Left-sided weakness EXAM: CT ANGIOGRAPHY HEAD AND NECK TECHNIQUE: Multidetector CT imaging of the head and neck was performed using the standard protocol during bolus administration of intravenous contrast. Multiplanar CT image reconstructions and MIPs were obtained to evaluate the vascular anatomy. Carotid stenosis measurements (when applicable) are obtained utilizing NASCET criteria, using the distal internal carotid diameter as the denominator. CONTRAST:  50 mL Isovue 370 COMPARISON:  Head CT 08/09/2016 FINDINGS: CTA NECK FINDINGS Aortic arch: There is no aneurysm or dissection of the visualized ascending aorta or aortic arch. There is a normal 3 vessel branching pattern. The visualized proximal subclavian arteries are normal. Right carotid system: The right common carotid origin is widely patent. There is no common carotid or internal carotid artery dissection or aneurysm. No hemodynamically significant stenosis. Mild atherosclerotic calcification of the proximal internal carotid artery. Or Left carotid system: The left common carotid origin is widely patent. There is no common carotid or internal carotid artery dissection or aneurysm. No hemodynamically significant stenosis. Vertebral arteries: The vertebral system is codominant. There is severe narrowing of the left vertebral artery origin due to atherosclerotic calcification. Both vertebral arteries are otherwise normal to their confluence with the basilar artery. Skeleton: There is no bony spinal canal stenosis. No lytic or blastic lesions.  Other neck: The nasopharynx is clear. The oropharynx and hypopharynx are normal. The epiglottis is normal. The supraglottic larynx, glottis and subglottic larynx are normal. No retropharyngeal collection. The parapharyngeal spaces are preserved. The parotid and submandibular glands are normal. No sialolithiasis or salivary ductal dilatation. The thyroid gland is normal. There is no cervical lymphadenopathy. Upper chest: No pneumothorax or pleural effusion. No nodules or masses. Review of the MIP images confirms the above findings CTA HEAD FINDINGS Anterior circulation: --Intracranial internal carotid arteries: Normal. --Anterior cerebral arteries: Normal. --Middle cerebral arteries: Normal. --Posterior communicating arteries: Diminutive bilaterally Posterior circulation: --Posterior cerebral arteries: Normal. --Superior cerebellar arteries: Normal. --Basilar artery: Normal. --Anterior inferior cerebellar arteries: Normal. --Posterior inferior cerebellar arteries: Normal. Venous sinuses: As permitted by contrast timing, patent. Anatomic variants: None Delayed phase: Not performed. Review of the MIP images confirms the above findings IMPRESSION: 1. Narrowing emergent intracranial large vessel occlusion. 2. 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 3. Otherwise, no hemodynamically significant stenosis of the major cervical arteries. Electronically Signed: By: Ulyses Jarred M.D. On: 08/10/2016 00:04   Mr Brain Wo Contrast  Result Date: 08/10/2016 CLINICAL DATA:  Acute left-sided weakness EXAM: MRI HEAD WITHOUT CONTRAST MRA HEAD WITHOUT CONTRAST TECHNIQUE: Multiplanar, multiecho pulse sequences of the brain and surrounding structures were obtained without intravenous contrast. Angiographic images of the head were  obtained using MRA technique without contrast. COMPARISON:  CTA head and neck 08/09/2016 FINDINGS: MRI HEAD FINDINGS Brain: The midline structures are normal.  There is focal diffusion restriction within the right caudate tail of and extending inferiorly to involve the posterior limb of the right internal capsule. There is no hemorrhage. No mass effect. There are additional punctate foci of diffusion restriction within the left parietal lobe and right occipital lobe. There is beginning confluent hyperintense T2-weighted signal within the periventricular white matter, most often seen in the setting of chronic microvascular ischemia. No mass lesion. No chronic microhemorrhage or cerebral amyloid angiopathy. No hydrocephalus, age advanced atrophy or lobar predominant volume loss. No dural abnormality or extra-axial collection. Skull and upper cervical spine: The visualized skull base, calvarium, upper cervical spine and extracranial soft tissues are normal. Sinuses/Orbits: No fluid levels or advanced mucosal thickening. No mastoid effusion. Normal orbits. MRA HEAD FINDINGS Intracranial internal carotid arteries: Normal. Anterior cerebral arteries: Normal. Middle cerebral arteries: Normal. Posterior communicating arteries: Present bilaterally. Posterior cerebral arteries: Normal. Basilar artery: Normal. Vertebral arteries: Left dominant. Normal. Superior cerebellar arteries: Normal. Anterior inferior cerebellar arteries: Not clearly seen, which is not uncommon. Posterior inferior cerebellar arteries: Normal. IMPRESSION: 1. Acute ischemia of the right caudate tail extending to the posterior limb of the right internal capsule, in keeping with acute left-sided weakness. 2. Punctate foci of acute ischemia within the left parietal lobe and at the right temporal occipital junction. The distribution of lesions suggests a central cardioembolic process. 3. No hemorrhage or mass effect. 4. Chronic microvascular ischemia. 5. Normal intracranial MRA. Electronically Signed   By: Ulyses Jarred M.D.   On: 08/10/2016 04:10   Mr Jodene Nam Head/brain RS Cm  Result Date: 08/10/2016 CLINICAL DATA:   Acute left-sided weakness EXAM: MRI HEAD WITHOUT CONTRAST MRA HEAD WITHOUT CONTRAST TECHNIQUE: Multiplanar, multiecho pulse sequences of the brain and surrounding structures were obtained without intravenous contrast. Angiographic images of the head were obtained using MRA technique without contrast. COMPARISON:  CTA head and neck 08/09/2016 FINDINGS: MRI HEAD FINDINGS Brain: The midline structures are normal. There is focal diffusion restriction within the right caudate tail of and extending inferiorly to involve the posterior limb of the right internal capsule. There is no hemorrhage. No mass effect. There are additional punctate foci of diffusion restriction within the left parietal lobe and right occipital lobe. There is beginning confluent hyperintense T2-weighted signal within the periventricular white matter, most often seen in the setting of chronic microvascular ischemia. No mass lesion. No chronic microhemorrhage or cerebral amyloid angiopathy. No hydrocephalus, age advanced atrophy or lobar predominant volume loss. No dural abnormality or extra-axial collection. Skull and upper cervical spine: The visualized skull base, calvarium, upper cervical spine and extracranial soft tissues are normal. Sinuses/Orbits: No fluid levels or advanced mucosal thickening. No mastoid effusion. Normal orbits. MRA HEAD FINDINGS Intracranial internal carotid arteries: Normal. Anterior cerebral arteries: Normal. Middle cerebral arteries: Normal. Posterior communicating arteries: Present bilaterally. Posterior cerebral arteries: Normal. Basilar artery: Normal. Vertebral arteries: Left dominant. Normal. Superior cerebellar arteries: Normal. Anterior inferior cerebellar arteries: Not clearly seen, which is not uncommon. Posterior inferior cerebellar arteries: Normal. IMPRESSION: 1. Acute ischemia of the right caudate tail extending to the posterior limb of the right internal capsule, in keeping with acute left-sided weakness. 2.  Punctate foci of acute ischemia within the left parietal lobe and at the right temporal occipital junction. The distribution of lesions suggests a central cardioembolic process. 3. No hemorrhage or mass effect. 4. Chronic microvascular  ischemia. 5. Normal intracranial MRA. Electronically Signed   By: Ulyses Jarred M.D.   On: 08/10/2016 04:10   Ct Head Code Stroke W/o Cm  Addendum Date: 08/10/2016   ADDENDUM REPORT: 08/10/2016 00:13 ADDENDUM: These results were called by telephone at the time of interpretation on 08/10/2016 at 11:58 p.m. to Dr. Kerney Elbe, who verbally acknowledged these results. Electronically Signed   By: Ulyses Jarred M.D.   On: 08/10/2016 00:13   Result Date: 08/10/2016 CLINICAL DATA:  Code stroke.  Left-sided weakness EXAM: CT HEAD WITHOUT CONTRAST TECHNIQUE: Contiguous axial images were obtained from the base of the skull through the vertex without intravenous contrast. COMPARISON:  Head CT 05/14/2015 FINDINGS: Brain: No mass lesion, intraparenchymal hemorrhage or extra-axial collection. No evidence of acute cortical infarct. There is periventricular hypoattenuation compatible with chronic microvascular disease. Vascular: No hyperdense vessel or unexpected calcification. Skull: Normal visualized skull base, calvarium and extracranial soft tissues. Sinuses/Orbits: No sinus fluid levels or advanced mucosal thickening. No mastoid effusion. Normal orbits. ASPECTS [facility] Stroke Program Early CT Score) - Ganglionic level infarction (caudate, lentiform nuclei, internal capsule, insula, M1-M3 cortex): 7 - Supraganglionic infarction (M4-M6 cortex): 3 Total score (0-10 with 10 being normal): 10 IMPRESSION: 1. 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 2. ASPECTS is 10. Electronically Signed: By: Ulyses Jarred M.D. On: 08/09/2016 23:47    Microbiology: No results found for this or any previous visit (from the past 240 hour(s)).   Labs: Basic Metabolic  Panel:  Recent Labs Lab 08/09/16 2322 08/09/16 2327  NA 138 140  K 3.5 3.5  CL 108 106  CO2 24  --   GLUCOSE 104* 101*  BUN 14 17  CREATININE 1.10 1.00  CALCIUM 8.6*  --    Liver Function Tests:  Recent Labs Lab 08/09/16 2322  AST 19  ALT 11*  ALKPHOS 83  BILITOT 0.5  PROT 7.0  ALBUMIN 3.6   No results for input(s): LIPASE, AMYLASE in the last 168 hours. No results for input(s): AMMONIA in the last 168 hours. CBC:  Recent Labs Lab 08/09/16 2322 08/09/16 2327  WBC 8.8  --   NEUTROABS 3.8  --   HGB 10.2* 11.9*  HCT 35.0* 35.0*  MCV 76.4*  --   PLT 264  --    Cardiac Enzymes: No results for input(s): CKTOTAL, CKMB, CKMBINDEX, TROPONINI in the last 168 hours. BNP: BNP (last 3 results) No results for input(s): BNP in the last 8760 hours.  ProBNP (last 3 results) No results for input(s): PROBNP in the last 8760 hours.  CBG:  Recent Labs Lab 08/09/16 2326  GLUCAP 93       Signed:  Nita Sells MD   [facility] 08/11/2016, 1:21 PM

## 2016-08-11 NOTE — Progress Notes (Signed)
Chaplain Note:  Met with Pt. , Wife and daughter on 4/26 discussed desire to complete Living Will and HCPOA Addressed the few questions they had and arranged to come back on Friday and assist in completing.  Came back today at 10 am with Notary and Witnesses and assisted patient to complete his and also notarized copy for wife as well.  Copy of Advanced Directives for Patient give to nurse to include in chart.   Sue Lush.

## 2016-08-11 NOTE — Progress Notes (Signed)
PT Cancellation Note  Patient Details Name: [name] MRN: [id number] DOB: June 08, 1940   Cancelled Treatment:    Reason Eval/Treat Not Completed: Patient at procedure or test/unavailable. Pt having echo performed at this time. PT will continue to f/u with pt as available.   [place] 08/11/2016, 9:55 AM

## 2016-08-11 NOTE — Progress Notes (Signed)
OT Cancellation Note  Patient Details Name: Davinder F [place] MRN: [id number] DOB: 1940-09-24   Cancelled Treatment:    Reason Eval/Treat Not Completed: Patient at procedure or test/ unavailable. Pt getting echo done. OT to check back as schedule allows.  [place] 08/11/2016, 9:55 AM  Hulda Humphrey OTR/L [phone]

## 2016-08-11 NOTE — PMR Pre-admission (Signed)
PMR Admission Coordinator Pre-Admission Assessment  Patient: [name] is an 76 y.o., male MRN: [id number] DOB: 07-Jan-1941 Height: 5\' 7"  (170.2 cm) Weight: 80.6 kg (177 lb 11.2 oz)              Insurance Information HMO:     PPO: X     PCP:      IPA:      80/20:      OTHER:  PRIMARY: [employer]       Policy#: [id number]      Subscriber: Self CM Name: Eula Listen      Phone#: [phone]     Fax#: [phone] Pre-Cert#: [id number], for 7 days with case managers to follow up via EMR access      Employer: Retired Benefits:  Phone #: [phone]     Name: automated phone line Eff. Date: 04/17/16     Deduct: $0      Out of Pocket Max: $3100      Life Max: N/A CIR: $250 first day; $125 a day, days 2-6; $0 for any additional days SNF: $0 a day, days 1-20; $140 a day, days 21-100 Outpatient: PT/OT     Co-Pay: $10 per visit  Home Health: PT/OT      Co-Pay: $10 per visit  DME: 80%     Co-Pay: 20% Providers: in network  Medicaid Application Date:       Case Manager:  Disability Application Date:       Case Worker:   Emergency Facilities manager Information    Name Relation Home Work Mobile   [place] Spouse [phone]  [phone]     Current Medical History  Patient Admitting Diagnosis: 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 Per chart review patient lives with spouse and was independent prior to admission. Two-level home with bedroom upstairs. Presented 08/10/2016 with left-sided weakness of acute onset resulting in a fall with no loss of consciousness. Blood pressure mildly elevated 170/100. CT/MRI showed acute ischemia of the right caudate tail extending to the posterior  limb of the right internal capsule. Punctate foci of acute ischemia within the left parietal lobe and at the right temporal occipital junction. MRA was unremarkable. Patient did not receive TPA. CT angiogram head and neck showed narrowing emergent intracranial large vessel occlusion. 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 Echocardiogram with EF 42% grade 1 diastolic dysfunction. No current plan for TEE secondary significant GI history. Venous Dopplers lower extremities negative for DVT. Neurology consulted currently on aspirin for CVA prophylaxis. Subcutaneous Lovenox for DVT prophylaxis. Physical and occupational therapy evaluations completed with recommendations for physical medicine rehabilitation consult. Patient was admitted for a comprehensive rehabilitation program.  NIH Total: 3    Past Medical History  Past Medical History:  Diagnosis Date  . Blood transfusion    " no reaction from transfusion"  . BPH (benign prostatic hyperplasia)   . Bruises easily   . Bursitis    chronic hip pain  . Cancer [facility])    prostate cancer cells  . Disc disease, degenerative, lumbar or lumbosacral    Chronic back pain now  . Diverticulitis   . Esophageal ring    requiring dilations   . GERD (gastroesophageal reflux disease)   . History of  GI bleed    "vessels burst in colon" x2  . History of kidney stones    chronic  . History of vertigo   . Osteoarthritis   . Osteoporosis   . Pneumonia    yrs ago  . Rash    GROIN - ITCHING PAST 3 MONTHS - it is gone now 05/02/16  . Rheumatoid arthritis(714.0)    27 years  . Spondylisthesis   . Ureteral stone 11/2012    Family History  family history includes Heart failure in his father and mother. He was adopted.  Prior Rehab/Hospitalizations:  Has the patient had major surgery during 100 days prior to admission? Yes  Current Medications   Current Facility-Administered Medications:  .   acetaminophen (TYLENOL) tablet 650 mg, 650 mg, Oral, Q4H PRN **OR** acetaminophen (TYLENOL) solution 650 mg, 650 mg, Per Tube, Q4H PRN **OR** acetaminophen (TYLENOL) suppository 650 mg, 650 mg, Rectal, Q4H PRN, Ilene Qua Opyd, MD .  aspirin suppository 300 mg, 300 mg, Rectal, Daily **OR** aspirin tablet 325 mg, 325 mg, Oral, Daily, Vianne Bulls, MD, 325 mg at 08/11/16 0909 .  atorvastatin (LIPITOR) tablet 40 mg, 40 mg, Oral, q1800, Kerney Elbe, MD, 40 mg at 08/10/16 1859 .  enoxaparin (LOVENOX) injection 40 mg, 40 mg, Subcutaneous, Q24H, Ilene Qua Opyd, MD, 40 mg at 08/11/16 0909 .  labetalol (NORMODYNE,TRANDATE) injection 5-10 mg, 5-10 mg, Intravenous, Q2H PRN, Ilene Qua Opyd, MD .  leflunomide (ARAVA) tablet 20 mg, 20 mg, Oral, Daily, Ilene Qua Opyd, MD, 20 mg at 08/11/16 0908 .  naphazoline-glycerin (CLEAR EYES) ophth solution 2 drop, 2 drop, Both Eyes, QID PRN, Ilene Qua Opyd, MD .  pantoprazole (PROTONIX) EC tablet 40 mg, 40 mg, Oral, Daily, Vianne Bulls, MD, 40 mg at 08/11/16 0909 .  predniSONE (DELTASONE) tablet 5 mg, 5 mg, Oral, Daily, Ilene Qua Opyd, MD, 5 mg at 08/11/16 0909 .  senna-docusate (Senokot-S) tablet 1 tablet, 1 tablet, Oral, QHS PRN, Ilene Qua Opyd, MD .  traMADol (ULTRAM) tablet 50 mg, 50 mg, Oral, Q6H PRN, Vianne Bulls, MD  Patients Current Diet: Diet Heart Room service appropriate? Yes; Fluid consistency: Thin  Precautions / Restrictions Precautions Precautions: Fall Restrictions Weight Bearing Restrictions: No   Has the patient had 2 or more falls or a fall with injury in the past year?No  Prior Activity Level Community (5-7x/wk): Patient is retired and lives at home with his spouse, [name].    Home Assistive Devices / Equipment Home Assistive Devices/Equipment: None Home Equipment: Cane - single point  Prior Device Use: Indicate devices/aids used by the patient prior to current illness, exacerbation or injury? None of the above  Prior Functional  Level Prior Function Level of Independence: Independent Comments: Drives, cleans. Active.  Self Care: Did the patient need help bathing, dressing, using the toilet or eating? Independent  Indoor Mobility: Did the patient need assistance with walking from room to room (with or without device)? Independent  Stairs: Did the patient need assistance with internal or external stairs (with or without device)? Independent  Functional Cognition: Did the patient need help planning regular tasks such as shopping or remembering to take medications? Independent  Current Functional Level Cognition  Arousal/Alertness: Awake/alert Overall Cognitive Status: Impaired/Different from baseline Orientation Level: Oriented X4 Safety/Judgement: Decreased awareness of safety, Decreased awareness of deficits Attention: Sustained Sustained Attention: Appears intact Memory: Impaired Memory Impairment: Storage deficit, Retrieval deficit Awareness: Appears intact Problem Solving: Appears intact (for verbal) Safety/Judgment: Appears intact  Extremity Assessment (includes Sensation/Coordination)  Upper Extremity Assessment: LUE deficits/detail LUE Deficits / Details: Dysmetria with finger-to-nose. MMT revealed 3+/5 for shoulder flexion, shoulder abduction and elbow flexion. Sensation grossly intact. Pt with decreased grasp strength (but able to get full digit extension and flexion) and decreased fine motor coordination.  Lower Extremity Assessment: LLE deficits/detail, Defer to PT evaluation LLE Deficits / Details: MMT revealed 3+/5 for hip flexion, hip abduction and hip adduction; 4/5 for knee flexion, knee extension and ankle DF. Sensation grossly intact.    ADLs  Overall ADL's : Needs assistance/impaired Eating/Feeding: Minimal assistance, Sitting Grooming: Oral care, Moderate assistance, Sitting Grooming Details (indicate cue type and reason): Pt needed assist with stabilizing toothbrush, and assist  squeezing with R hand on tube of toothpaste Upper Body Bathing: Minimal assistance, Sitting Lower Body Bathing: Moderate assistance, Sitting/lateral leans Upper Body Dressing : Minimal assistance, Sitting Lower Body Dressing: Maximal assistance, Sit to/from stand, +2 for physical assistance Toilet Transfer: Maximal assistance, +2 for physical assistance, BSC Toilet Transfer Details (indicate cue type and reason): Staff has been using STEDY which is appropriate Toileting- Clothing Manipulation and Hygiene: Maximal assistance, +2 for physical assistance Toileting - Clothing Manipulation Details (indicate cue type and reason): Pt able to reach, needs assist for sit to stand Tub/ Shower Transfer: Maximal assistance, +2 for physical assistance Functional mobility during ADLs:  (not attempted this session, STEDY used to transfer to bed) General ADL Comments: Pt VERY motivated and willing to work with therapy    Mobility  General bed mobility comments: pt sitting OOB in recliner chair when therapist entered room.     Transfers  Overall transfer level: Needs assistance Equipment used: Ambulation equipment used Transfer via Lift Equipment: Stedy Transfers: Sit to/from Stand Sit to Stand: Max assist General transfer comment: increased time, max A to rise into standing and to maintain standing as pt with heavy L sided lean, Pt able to grab onto bar with BUE    Ambulation / Gait / Stairs / Office manager / Balance Dynamic Sitting Balance Sitting balance - Comments: pt requiring assistance and support of recliner chair for UE testing Balance Overall balance assessment: Needs assistance Sitting-balance support: Feet supported Sitting balance-Leahy Scale: Poor Sitting balance - Comments: pt requiring assistance and support of recliner chair for UE testing Postural control: Left lateral lean Standing balance support: During functional activity, Single extremity  supported Standing balance-Leahy Scale: Poor Standing balance comment: max A to maintain standing, pt with heavy L lateral lean    Special needs/care consideration BiPAP/CPAP: No CPM: No Continuous Drip IV: No Dialysis: No         Life Vest: No Oxygen: No Special Bed: No Trach Size: No Wound Vac (area): No       Skin: bruising bilateral upper extremities                                Bowel mgmt: 08/10/16, continent  Bladder mgmt: Continent  Diabetic mgmt: HgbA1c - 5.8     Previous Home Environment Living Arrangements: Spouse/significant other  Lives With: Spouse Available Help at Discharge: Family, Available 24 hours/day Type of Home: House Home Layout: Two level, Bed/bath upstairs Alternate Level Stairs-Rails: Left Alternate Level Stairs-Number of Steps: 1 flight Home Access: Level entry Bathroom Shower/Tub: Tub only (jacuzzitub) Bathroom Toilet: Handicapped height Bathroom Accessibility: Yes How Accessible: Accessible via walker Home Care Services: No  Discharge  Living Setting Plans for Discharge Living Setting: Patient's home, Lives with (comment) (spouse) Type of Home at Discharge: House Discharge Home Layout: Two level, Bed/bath upstairs, Full bath on main level Alternate Level Stairs-Rails: Left Alternate Level Stairs-Number of Steps: 14 Discharge Home Access: Level entry Discharge Bathroom Shower/Tub: Tub only (main level has a tub/shower combo with a curtain) Discharge Bathroom Toilet: Handicapped height (upstairs, standard on main level ) Discharge Bathroom Accessibility: Yes How Accessible: Accessible via walker Does the patient have any problems obtaining your medications?: No  Social/Family/Support Systems Patient Roles: Spouse, Parent, Other (Comment) (grandparent ) Contact Information: Spouse: [name] [phone] Anticipated Caregiver: spouse Anticipated Caregiver's Contact Information: see above Ability/Limitations of Caregiver: None Caregiver  Availability: 24/7 Discharge Plan Discussed with Primary Caregiver: Yes Is Caregiver In Agreement with Plan?: Yes Does Caregiver/Family have Issues with Lodging/Transportation while Pt is in Rehab?: No  Goals/Additional Needs Patient/Family Goal for Rehab: PT/OT Mod I-Supervision  Expected length of stay: 10-14 days  Cultural Considerations: None Dietary Needs: Heart Healthy Restrictions  Equipment Needs: TBD Special Service Needs: None Pt/Family Agrees to Admission and willing to participate: Yes Program Orientation Provided & Reviewed with Pt/Caregiver Including Roles  & Responsibilities: Yes Additional Information Needs: Patient with history of HH that has been repaired, but currently has a loose stitch that patient missed appointment to get repaired due to CVA. Information Needs to be Provided By: FYI for Team   Decrease burden of Care through IP rehab admission: No  Possible need for SNF placement upon discharge: No  Patient Condition: This patient's condition remains as documented in the consult dated 08/10/16, in which the Rehabilitation Physician determined and documented that the patient's condition is appropriate for intensive rehabilitative care in an inpatient rehabilitation facility. Will admit to inpatient rehab today.  Preadmission Screen Completed By:  [name], 08/11/2016 1:45 PM ______________________________________________________________________   Discussed status with Dr. Naaman Plummer on 08/11/16 at 1610 and received telephone approval for admission today.  Admission Coordinator:  [name], time 1610/Date 08/11/16

## 2016-08-11 NOTE — Progress Notes (Signed)
Responded to consult to create advanced directive, but alert pt and vivacious wife said a gentleman Jeanella Craze) had already been by earlier and taken care of their needs, and they were very happy with his visit and to have gotten this done now. B/c pt was b/t visits of OT (leaving) and PT (arriving), enjoyed brief dialog and prayer of blessing with them in departing. Chaplain available for f/u.   08/11/16 1400  Clinical Encounter Type  Visited With Patient and family together;Health care provider  Visit Type Follow-up;Psychological support;Spiritual support;Social support  Referral From Nurse  Spiritual Encounters  Spiritual Needs Brochure;Prayer;Emotional   Gerrit Heck, Chaplain

## 2016-08-11 NOTE — Care Management Note (Signed)
Case Management Note  Patient Details  Name: [name] MRN: [id number] Date of Birth: April 29, 1940  Subjective/Objective:                    Action/Plan: Pt discharging to CIR. No further needs per CM.  Expected Discharge Date:  08/11/16               Expected Discharge Plan:  [place]  In-House Referral:     Discharge planning Services     Post Acute Care Choice:    Choice offered to:     DME Arranged:    DME Agency:     HH Arranged:    HH Agency:     Status of Service:  Completed, signed off  If discussed at [employer], dates discussed:    Additional Comments:  Pollie Friar, RN 08/11/2016, 3:48 PM

## 2016-08-11 NOTE — Progress Notes (Signed)
Occupational Therapy Treatment Patient Details Name: [name] MRN: [id number] DOB: 01/08/1941 Today's Date: 08/11/2016    History of present illness Pt is a 76 y/o male admitted from home secondary to acute L sided weakness in which he fell to the floor. In ED, head CT revealed chronic microvascular ischemia. Neurology has been consulted. PMH including but not limited to prostate cancer, RA and hx of bilateral TKA in 2003.   OT comments  Pt progressing towards OT goals this session. Session focused on seating ADL and balance during sitting and standing.  CIR continues to be required to return Pt to PLOF. Pt continues to be very pleasant and motivated to work with therapy.   Follow Up Recommendations  CIR    Equipment Recommendations  Other (comment) (defer to next venue)    Recommendations for Other Services      Precautions / Restrictions Precautions Precautions: Fall Restrictions Weight Bearing Restrictions: No       Mobility Bed Mobility               General bed mobility comments: pt sitting OOB in recliner chair when therapist entered room.   Transfers Overall transfer level: Needs assistance Equipment used: Rolling walker (2 wheeled) Transfers: Sit to/from Stand Sit to Stand: Mod assist         General transfer comment: increased time, vc for Bilateral safe hand placement, assist to steady and mod vc for safety    Balance Overall balance assessment: Needs assistance Sitting-balance support: Bilateral upper extremity supported;Feet supported Sitting balance-Leahy Scale: Fair   Postural control: Left lateral lean Standing balance support: Bilateral upper extremity supported;During functional activity Standing balance-Leahy Scale: Poor Standing balance comment: Pt continues to demonstrate L lateral lean, relaint on RW for balance                           ADL either performed or assessed with clinical judgement   ADL Overall ADL's :  Needs assistance/impaired     Grooming: Minimal assistance;With caregiver independent assisting;Sitting Grooming Details (indicate cue type and reason): Pt's wife assisting, increased fine motor skill - Pt encouraged to do as much as possible with left hand Upper Body Bathing: Minimal assistance;With caregiver independent assisting;Sitting Upper Body Bathing Details (indicate cue type and reason): Pt and wife finishing up bath as therapy entered the room Lower Body Bathing: Minimal assistance;With caregiver independent assisting;Sitting/lateral leans Lower Body Bathing Details (indicate cue type and reason): Pt and wife finishing up bath as therapy entered the room Upper Body Dressing : Set up;Sitting   Lower Body Dressing: Maximal assistance;Sit to/from stand;+2 for safety/equipment;With caregiver independent assisting (wife acting as one of assists)   Armed forces technical officer: Moderate assistance;Cueing for safety;Cueing for sequencing;Ambulation;RW Toilet Transfer Details (indicate cue type and reason): progressing from [place] and Hygiene: Moderate assistance;Cueing for safety;Sit to/from stand;+2 for safety/equipment       Functional mobility during ADLs: Moderate assistance;Cueing for safety;Cueing for sequencing;Rolling walker;+2 for safety/equipment (fatigued very quickly) General ADL Comments: Pt VERY motivated and willing to work with therapy     Vision       Perception     Praxis      Cognition Arousal/Alertness: Awake/alert Behavior During Therapy: WFL for tasks assessed/performed Overall Cognitive Status: Impaired/Different from baseline Area of Impairment: Safety/judgement                         Safety/Judgement:  Decreased awareness of safety;Decreased awareness of deficits              Exercises     Shoulder Instructions       General Comments Pt's wife present for session    Pertinent Vitals/ Pain       Pain  Assessment: No/denies pain  Home Living                                          Prior Functioning/Environment              Frequency  Min 3X/week        Progress Toward Goals  OT Goals(current goals can now be found in the care plan section)  Progress towards OT goals: Progressing toward goals  Acute Rehab OT Goals Patient Stated Goal: to return to independence OT Goal Formulation: With patient/family Time For Goal Achievement: 08/24/16 Potential to Achieve Goals: Good  Plan Discharge plan remains appropriate;Frequency remains appropriate    Co-evaluation    PT/OT/SLP Co-Evaluation/Treatment: Yes Reason for Co-Treatment: For patient/therapist safety;To address functional/ADL transfers   OT goals addressed during session: ADL's and self-care      End of Session Equipment Utilized During Treatment: Gait belt;Rolling walker  OT Visit Diagnosis: Other abnormalities of gait and mobility (R26.89);Muscle weakness (generalized) (M62.81);Other symptoms and signs involving the nervous system (R29.898);Hemiplegia and hemiparesis Hemiplegia - Right/Left: Left Hemiplegia - dominant/non-dominant: Non-Dominant Hemiplegia - caused by: Cerebral infarction   Activity Tolerance Patient tolerated treatment well   Patient Left in chair;with call bell/phone within reach;with chair alarm set;with family/visitor present   Nurse Communication Mobility status        Time: 1121-6244 OT Time Calculation (min): 23 min  Charges: OT General Charges $OT Visit: 1 Procedure OT Treatments $Self Care/Home Management : 8-22 mins Hulda Humphrey OTR/L [place] 08/11/2016, 3:04 PM

## 2016-08-11 NOTE — H&P (Signed)
Physical Medicine and Rehabilitation Admission H&P    Chief Complaint  Patient presents with  . Code Stroke  : HPI: [name] is a 76 y.o. 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 Per chart review patient lives with spouse independent prior to admission . Two-level home with bedroom upstairs. Presented 08/10/2016 with left-sided weakness of acute onset resulting in a fall with no loss of consciousness. Blood pressure mildly elevated 170/100. CT/MRI showed acute ischemia of the right caudate tail extending to the posterior limb of the right internal capsule. Punctate foci of acute ischemia within the left parietal lobe and at the right temporal occipital junction. MRA was unremarkable. Patient did not receive TPA. CT angiogram head and neck showed narrowing emergent intracranial large vessel occlusion. 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 Echocardiogram with EF 10% grade 1 diastolic dysfunction.No current plan for TEE secondary significant GI history. Venous Dopplers lower extremities negative for DVT. Neurology consulted currently on aspirin for CVA prophylaxis. Subcutaneous Lovenox for DVT prophylaxis. Physical and occupational therapy evaluations completed with recommendations of physical medicine rehabilitation consult. Patient was admitted for a comprehensive rehabilitation program  Review of Systems  Constitutional: Negative for chills and fever.  HENT: Negative for hearing loss.   Eyes: Negative for blurred vision and double vision.  Respiratory: Negative for cough and shortness of breath.   Cardiovascular: Negative for chest pain, palpitations and leg swelling.  Gastrointestinal: Positive for constipation. Negative for nausea and vomiting.  Genitourinary:  Positive for urgency. Negative for hematuria.  Musculoskeletal: Positive for back pain and myalgias.  Skin: Negative for rash.  Neurological: Positive for weakness. Negative for seizures.       Vertigo  All other systems reviewed and are negative.  Past Medical History:  Diagnosis Date  . Blood transfusion    " no reaction from transfusion"  . BPH (benign prostatic hyperplasia)   . Bruises easily   . Bursitis    chronic hip pain  . Cancer [facility])    prostate cancer cells  . Disc disease, degenerative, lumbar or lumbosacral    Chronic back pain now  . Diverticulitis   . Esophageal ring    requiring dilations   . GERD (gastroesophageal reflux disease)   . History of GI bleed    "vessels burst in colon" x2  . History of kidney stones    chronic  . History of vertigo   . Osteoarthritis   . Osteoporosis   . Pneumonia    yrs ago  . Rash    GROIN - ITCHING PAST 3 MONTHS - it is gone now 05/02/16  . Rheumatoid arthritis(714.0)    27 years  . Spondylisthesis   . Ureteral stone 11/2012   Past Surgical History:  Procedure Laterality Date  . BALLOON DILATION N/A 10/01/2012   Procedure: BALLOON DILATION;  Surgeon: Garlan Fair, MD;  Location: [facility];  Service: Endoscopy;  Laterality: N/A;  . COLECTOMY N/A 06/10/2013   Procedure: TOTAL abdominal COLECTOMY;  Surgeon: Odis Hollingshead, MD;  Location: [facility];  Service: General;  Laterality: N/A;  . COLONOSCOPY    . CYSTOSCOPY WITH URETEROSCOPY Right 12/05/2012   Procedure: CYSTOSCOPY WITH RIGHT URETEROSCOPY AND STONE EXTRACTION;  Surgeon: Malka So, MD;  Location: [facility];  Service: Urology;  Laterality: Right;  . ESOPHAGOGASTRODUODENOSCOPY N/A 10/01/2012  Procedure: ESOPHAGOGASTRODUODENOSCOPY (EGD);  Surgeon: Garlan Fair, MD;  Location: [facility];  Service: Endoscopy;  Laterality: N/A;  . HERNIA REPAIR    . INGUINAL HERNIA REPAIR  2007   Bilateral   . Lithotripsy for nephrolithiasis     Pt and wife not sure when  but here in [place]  . Lysis of Adhesion, small bowel resection  2009  . MAXIMUM ACCESS (MAS)POSTERIOR LUMBAR INTERBODY FUSION (PLIF) 1 LEVEL N/A 05/08/2016   Procedure: LUMBAR FOUR-FIVE FOR MAXIMUM ACCESS (MAS) POSTERIOR LUMBAR INTERBODY FUSION;  Surgeon: Kary Kos, MD;  Location: [facility];  Service: Neurosurgery;  Laterality: N/A;  . NISSEN FUNDOPLICATION     about [id number], pt not sure records not currently available  . TOTAL KNEE ARTHROPLASTY  2003   Bilateral  . TRANSURETHRAL PROSTATECTOMY WITH GYRUS INSTRUMENTS N/A 12/05/2012   Procedure: TRANSURETHRAL PROSTATECTOMY WITH GYRUS INSTRUMENTS;  Surgeon: Malka So, MD;  Location: [facility];  Service: Urology;  Laterality: N/A;  . Ventral Hernia Repair with Mesh     Family History  Problem Relation Age of Onset  . Adopted: Yes  . Heart failure Mother   . Heart failure Father    Social History:  reports that he has never smoked. He has never used smokeless tobacco. He reports that he does not drink alcohol or use drugs. Allergies:  Allergies  Allergen Reactions  . Clindamycin/Lincomycin Hives, Shortness Of Breath and Swelling  . Enbrel [Etanercept] Swelling    SEVERE LEG SWELLING   . Penicillins Hives and Shortness Of Breath    Has patient had a PCN reaction causing immediate rash, facial/tongue/throat swelling, SOB or lightheadedness with hypotension: Yes Has patient had a PCN reaction causing severe rash involving mucus membranes or skin necrosis: Yes Has patient had a PCN reaction that required hospitalization No Has patient had a PCN reaction occurring within the last 10 years: No If all of the above answers are "NO", then may proceed with Cephalosporin use.   . Codeine Hives and Swelling   Medications Prior to Admission  Medication Sig Dispense Refill  . acetaminophen (TYLENOL) 650 MG CR tablet Take 650-1,300 mg by mouth 2 (two) times daily. 1300 mg in the morning & 650 mg at night    . diphenhydramine-acetaminophen (TYLENOL PM)  25-500 MG TABS tablet Take 1 tablet by mouth at bedtime.    Marland Kitchen leflunomide (ARAVA) 20 MG tablet Take 20 mg by mouth daily.     Marland Kitchen omeprazole (PRILOSEC) 20 MG capsule Take 20 mg by mouth daily.      . predniSONE (DELTASONE) 5 MG tablet Take 5 mg by mouth daily.    Marland Kitchen tetrahydrozoline-zinc (VISINE-AC) 0.05-0.25 % ophthalmic solution Place 2 drops into both eyes 3 (three) times daily as needed (for allergies).    . traMADol (ULTRAM) 50 MG tablet Take 50 mg by mouth 2 (two) times daily.      Home: Home Living Family/patient expects to be discharged to:: Private residence Living Arrangements: Spouse/significant other Available Help at Discharge: Family, Available 24 hours/day Type of Home: House Home Access: Level entry Home Layout: Two level, Bed/bath upstairs Alternate Level Stairs-Number of Steps: 1 flight Alternate Level Stairs-Rails: Left Bathroom Shower/Tub: Tub only (jacuzzitub) Bathroom Toilet: Handicapped height Bathroom Accessibility: Yes Home Equipment: Cane - single point  Lives With: Spouse   Functional History: Prior Function Level of Independence: Independent Comments: Drives, cleans. Active.  Functional Status:  Mobility: Bed Mobility General bed mobility comments: pt sitting OOB in recliner chair when therapist  entered room.  Transfers Overall transfer level: Needs assistance Equipment used: Ambulation equipment used Transfer via Lift Equipment: Stedy Transfers: Sit to/from Stand Sit to Stand: Max assist General transfer comment: increased time, max A to rise into standing and to maintain standing as pt with heavy L sided lean, Pt able to grab onto bar with BUE      ADL: ADL Overall ADL's : Needs assistance/impaired Eating/Feeding: Minimal assistance, Sitting Grooming: Oral care, Moderate assistance, Sitting Grooming Details (indicate cue type and reason): Pt needed assist with stabilizing toothbrush, and assist squeezing with R hand on tube of  toothpaste Upper Body Bathing: Minimal assistance, Sitting Lower Body Bathing: Moderate assistance, Sitting/lateral leans Upper Body Dressing : Minimal assistance, Sitting Lower Body Dressing: Maximal assistance, Sit to/from stand, +2 for physical assistance Toilet Transfer: Maximal assistance, +2 for physical assistance, BSC Toilet Transfer Details (indicate cue type and reason): Staff has been using STEDY which is appropriate Toileting- Clothing Manipulation and Hygiene: Maximal assistance, +2 for physical assistance Toileting - Clothing Manipulation Details (indicate cue type and reason): Pt able to reach, needs assist for sit to stand Tub/ Shower Transfer: Maximal assistance, +2 for physical assistance Functional mobility during ADLs:  (not attempted this session, STEDY used to transfer to bed) General ADL Comments: Pt VERY motivated and willing to work with therapy  Cognition: Cognition Overall Cognitive Status: Impaired/Different from baseline Arousal/Alertness: Awake/alert Orientation Level: Oriented X4 Attention: Sustained Sustained Attention: Appears intact Memory: Impaired Memory Impairment: Storage deficit, Retrieval deficit Awareness: Appears intact Problem Solving: Appears intact (for verbal) Safety/Judgment: Appears intact Cognition Arousal/Alertness: Awake/alert Behavior During Therapy: WFL for tasks assessed/performed Overall Cognitive Status: Impaired/Different from baseline Area of Impairment: Safety/judgement Safety/Judgement: Decreased awareness of safety, Decreased awareness of deficits  Physical Exam: Blood pressure (!) 174/73, pulse 61, temperature 98.2 F (36.8 C), temperature source Oral, resp. rate 20, height '5\' 7"'$  (1.702 m), weight 80.6 kg (177 lb 11.2 oz), SpO2 97 %. Physical Exam  Constitutional: He appears well-developed. No distress.  HENT:  Head: Normocephalic and atraumatic.  Eyes: EOM are normal. Pupils are equal, round, and reactive to light.  Left eye exhibits no discharge.  Neck: Normal range of motion. Neck supple. No tracheal deviation present. No thyromegaly present.  Cardiovascular: Normal rate and regular rhythm.  Exam reveals no friction rub.   Murmur heard. Respiratory: Effort normal and breath sounds normal. No respiratory distress.  GI: Soft. Bowel sounds are normal. He exhibits no distension. There is no tenderness. There is no rebound.   Skin. Warm and dry Neurological: He is alert and oriented to person, place, and time.  Follows commands. Fair awareness of deficits  CN grossly intact. No sensory deficits. MMT, Right:  5/5 deltoid, 5/5 bicep, 5/5 tricep, 5/5 wrist extension, 5/5 hand intrinsics.      5/5 hip flexor, 5/5 knee extension, 5/5 ankle dorsiflexion, 5/5 ankle plantaflexion, Left:  4+/5 deltoid, 4+/5 bicep, 4+/5 tricep, 4/5 wrist extension, 4/5 hand intrinsics.      4/5 hip flexor, 4/5 knee extension, 4/5 ankle dorsiflexion, 4/5 ankle plantaflexion'. Decreased [place] LUE with FTN and FTF. DTR's 1+   Results for orders placed or performed during the hospital encounter of 08/09/16 (from the past 48 hour(s))  Protime-INR     Status: None   Collection Time: 08/09/16 11:22 PM  Result Value Ref Range   Prothrombin Time 14.1 11.4 - 15.2 seconds   INR 1.08   APTT     Status: None   Collection Time: 08/09/16 11:22 PM  Result Value Ref Range   aPTT 28 24 - 36 seconds  CBC     Status: Abnormal   Collection Time: 08/09/16 11:22 PM  Result Value Ref Range   WBC 8.8 4.0 - 10.5 K/uL   RBC 4.58 4.22 - 5.81 MIL/uL   Hemoglobin 10.2 (L) 13.0 - 17.0 g/dL   HCT 35.0 (L) 39.0 - 52.0 %   MCV 76.4 (L) 78.0 - 100.0 fL   MCH 22.3 (L) 26.0 - 34.0 pg   MCHC 29.1 (L) 30.0 - 36.0 g/dL   RDW 16.5 (H) 11.5 - 15.5 %   Platelets 264 150 - 400 K/uL  Differential     Status: Abnormal   Collection Time: 08/09/16 11:22 PM  Result Value Ref Range   Neutrophils Relative % 43 %   Neutro Abs 3.8 1.7 - 7.7 K/uL   Lymphocytes Relative 37 %    Lymphs Abs 3.3 0.7 - 4.0 K/uL   Monocytes Relative 14 %   Monocytes Absolute 1.3 (H) 0.1 - 1.0 K/uL   Eosinophils Relative 5 %   Eosinophils Absolute 0.4 0.0 - 0.7 K/uL   Basophils Relative 1 %   Basophils Absolute 0.1 0.0 - 0.1 K/uL  Comprehensive metabolic panel     Status: Abnormal   Collection Time: 08/09/16 11:22 PM  Result Value Ref Range   Sodium 138 135 - 145 mmol/L   Potassium 3.5 3.5 - 5.1 mmol/L   Chloride 108 101 - 111 mmol/L   CO2 24 22 - 32 mmol/L   Glucose, Bld 104 (H) 65 - 99 mg/dL   BUN 14 6 - 20 mg/dL   Creatinine, Ser 1.10 0.61 - 1.24 mg/dL   Calcium 8.6 (L) 8.9 - 10.3 mg/dL   Total Protein 7.0 6.5 - 8.1 g/dL   Albumin 3.6 3.5 - 5.0 g/dL   AST 19 15 - 41 U/L   ALT 11 (L) 17 - 63 U/L   Alkaline Phosphatase 83 38 - 126 U/L   Total Bilirubin 0.5 0.3 - 1.2 mg/dL   GFR calc non Af Amer >60 >60 mL/min   GFR calc Af Amer >60 >60 mL/min    Comment: (NOTE) The eGFR has been calculated using the CKD EPI equation. This calculation has not been validated in all clinical situations. eGFR's persistently <60 mL/min signify possible Chronic Kidney Disease.    Anion gap 6 5 - 15  I-stat troponin, ED     Status: None   Collection Time: 08/09/16 11:26 PM  Result Value Ref Range   Troponin i, poc 0.01 0.00 - 0.08 ng/mL   Comment 3            Comment: Due to the release kinetics of cTnI, a negative result within the first hours of the onset of symptoms does not rule out myocardial infarction with certainty. If myocardial infarction is still suspected, repeat the test at appropriate intervals.   CBG monitoring, ED     Status: None   Collection Time: 08/09/16 11:26 PM  Result Value Ref Range   Glucose-Capillary 93 65 - 99 mg/dL  I-Stat Chem 8, ED     Status: Abnormal   Collection Time: 08/09/16 11:27 PM  Result Value Ref Range   Sodium 140 135 - 145 mmol/L   Potassium 3.5 3.5 - 5.1 mmol/L   Chloride 106 101 - 111 mmol/L   BUN 17 6 - 20 mg/dL   Creatinine, Ser  1.00 0.61 - 1.24 mg/dL   Glucose, Bld 101 (  H) 65 - 99 mg/dL   Calcium, Ion 0.92 (L) 1.15 - 1.40 mmol/L   TCO2 24 0 - 100 mmol/L   Hemoglobin 11.9 (L) 13.0 - 17.0 g/dL   HCT 35.0 (L) 39.0 - 52.0 %  Hemoglobin A1c     Status: Abnormal   Collection Time: 08/10/16  7:37 AM  Result Value Ref Range   Hgb A1c MFr Bld 5.8 (H) 4.8 - 5.6 %    Comment: (NOTE)         Pre-diabetes: 5.7 - 6.4         Diabetes: >6.4         Glycemic control for adults with diabetes: <7.0    Mean Plasma Glucose 120 mg/dL    Comment: (NOTE) Performed At: [facility] [place], [place] [id number] Lindon Romp MD TO:[id number]   Lipid panel     Status: None   Collection Time: 08/10/16  7:37 AM  Result Value Ref Range   Cholesterol 140 0 - 200 mg/dL   Triglycerides 109 <150 mg/dL   HDL 48 >40 mg/dL   Total CHOL/HDL Ratio 2.9 RATIO   VLDL 22 0 - 40 mg/dL   LDL Cholesterol 70 0 - 99 mg/dL    Comment:        Total Cholesterol/HDL:CHD Risk Coronary Heart Disease Risk Table                     Men   Women  1/2 Average Risk   3.4   3.3  Average Risk       5.0   4.4  2 X Average Risk   9.6   7.1  3 X Average Risk  23.4   11.0        Use the calculated Patient Ratio above and the CHD Risk Table to determine the patient's CHD Risk.        ATP III CLASSIFICATION (LDL):  <100     mg/dL   Optimal  100-129  mg/dL   Near or Above                    Optimal  130-159  mg/dL   Borderline  160-189  mg/dL   High  >190     mg/dL   Very High    Ct Angio Head W Or Wo Contrast  Addendum Date: 08/10/2016   ADDENDUM REPORT: 08/10/2016 03:59 ADDENDUM: There is a transcription error in the 1st impression point. This should read, "No emergent large vessel occlusion." Electronically Signed   By: Ulyses Jarred M.D.   On: 08/10/2016 03:59   Result Date: 08/10/2016 CLINICAL DATA:  Left-sided weakness EXAM: CT ANGIOGRAPHY HEAD AND NECK TECHNIQUE: Multidetector CT imaging of the head and neck was  performed using the standard protocol during bolus administration of intravenous contrast. Multiplanar CT image reconstructions and MIPs were obtained to evaluate the vascular anatomy. Carotid stenosis measurements (when applicable) are obtained utilizing NASCET criteria, using the distal internal carotid diameter as the denominator. CONTRAST:  50 mL Isovue 370 COMPARISON:  Head CT 08/09/2016 FINDINGS: CTA NECK FINDINGS Aortic arch: There is no aneurysm or dissection of the visualized ascending aorta or aortic arch. There is a normal 3 vessel branching pattern. The visualized proximal subclavian arteries are normal. Right carotid system: The right common carotid origin is widely patent. There is no common carotid or internal carotid artery dissection or aneurysm. No hemodynamically significant stenosis. Mild atherosclerotic calcification of the  proximal internal carotid artery. Or Left carotid system: The left common carotid origin is widely patent. There is no common carotid or internal carotid artery dissection or aneurysm. No hemodynamically significant stenosis. Vertebral arteries: The vertebral system is codominant. There is severe narrowing of the left vertebral artery origin due to atherosclerotic calcification. Both vertebral arteries are otherwise normal to their confluence with the basilar artery. Skeleton: There is no bony spinal canal stenosis. No lytic or blastic lesions. Other neck: The nasopharynx is clear. The oropharynx and hypopharynx are normal. The epiglottis is normal. The supraglottic larynx, glottis and subglottic larynx are normal. No retropharyngeal collection. The parapharyngeal spaces are preserved. The parotid and submandibular glands are normal. No sialolithiasis or salivary ductal dilatation. The thyroid gland is normal. There is no cervical lymphadenopathy. Upper chest: No pneumothorax or pleural effusion. No nodules or masses. Review of the MIP images confirms the above findings CTA  HEAD FINDINGS Anterior circulation: --Intracranial internal carotid arteries: Normal. --Anterior cerebral arteries: Normal. --Middle cerebral arteries: Normal. --Posterior communicating arteries: Diminutive bilaterally Posterior circulation: --Posterior cerebral arteries: Normal. --Superior cerebellar arteries: Normal. --Basilar artery: Normal. --Anterior inferior cerebellar arteries: Normal. --Posterior inferior cerebellar arteries: Normal. Venous sinuses: As permitted by contrast timing, patent. Anatomic variants: None Delayed phase: Not performed. Review of the MIP images confirms the above findings IMPRESSION: 1. Narrowing emergent intracranial large vessel occlusion. 2. 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 3. Otherwise, no hemodynamically significant stenosis of the major cervical arteries. Electronically Signed: By: Ulyses Jarred M.D. On: 08/10/2016 00:04   Ct Angio Neck W And/or Wo Contrast  Addendum Date: 08/10/2016   ADDENDUM REPORT: 08/10/2016 03:59 ADDENDUM: There is a transcription error in the 1st impression point. This should read, "No emergent large vessel occlusion." Electronically Signed   By: Ulyses Jarred M.D.   On: 08/10/2016 03:59   Result Date: 08/10/2016 CLINICAL DATA:  Left-sided weakness EXAM: CT ANGIOGRAPHY HEAD AND NECK TECHNIQUE: Multidetector CT imaging of the head and neck was performed using the standard protocol during bolus administration of intravenous contrast. Multiplanar CT image reconstructions and MIPs were obtained to evaluate the vascular anatomy. Carotid stenosis measurements (when applicable) are obtained utilizing NASCET criteria, using the distal internal carotid diameter as the denominator. CONTRAST:  50 mL Isovue 370 COMPARISON:  Head CT 08/09/2016 FINDINGS: CTA NECK FINDINGS Aortic arch: There is no aneurysm or dissection of the visualized ascending aorta or aortic arch. There is a normal 3 vessel branching  pattern. The visualized proximal subclavian arteries are normal. Right carotid system: The right common carotid origin is widely patent. There is no common carotid or internal carotid artery dissection or aneurysm. No hemodynamically significant stenosis. Mild atherosclerotic calcification of the proximal internal carotid artery. Or Left carotid system: The left common carotid origin is widely patent. There is no common carotid or internal carotid artery dissection or aneurysm. No hemodynamically significant stenosis. Vertebral arteries: The vertebral system is codominant. There is severe narrowing of the left vertebral artery origin due to atherosclerotic calcification. Both vertebral arteries are otherwise normal to their confluence with the basilar artery. Skeleton: There is no bony spinal canal stenosis. No lytic or blastic lesions. Other neck: The nasopharynx is clear. The oropharynx and hypopharynx are normal. The epiglottis is normal. The supraglottic larynx, glottis and subglottic larynx are normal. No retropharyngeal collection. The parapharyngeal spaces are preserved. The parotid and submandibular glands are normal. No sialolithiasis or salivary ductal dilatation. The thyroid gland is normal. There is  no cervical lymphadenopathy. Upper chest: No pneumothorax or pleural effusion. No nodules or masses. Review of the MIP images confirms the above findings CTA HEAD FINDINGS Anterior circulation: --Intracranial internal carotid arteries: Normal. --Anterior cerebral arteries: Normal. --Middle cerebral arteries: Normal. --Posterior communicating arteries: Diminutive bilaterally Posterior circulation: --Posterior cerebral arteries: Normal. --Superior cerebellar arteries: Normal. --Basilar artery: Normal. --Anterior inferior cerebellar arteries: Normal. --Posterior inferior cerebellar arteries: Normal. Venous sinuses: As permitted by contrast timing, patent. Anatomic variants: None Delayed phase: Not performed.  Review of the MIP images confirms the above findings IMPRESSION: 1. Narrowing emergent intracranial large vessel occlusion. 2. 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 3. Otherwise, no hemodynamically significant stenosis of the major cervical arteries. Electronically Signed: By: Deatra Robinson M.D. On: 08/10/2016 00:04   Mr Brain Wo Contrast  Result Date: 08/10/2016 CLINICAL DATA:  Acute left-sided weakness EXAM: MRI HEAD WITHOUT CONTRAST MRA HEAD WITHOUT CONTRAST TECHNIQUE: Multiplanar, multiecho pulse sequences of the brain and surrounding structures were obtained without intravenous contrast. Angiographic images of the head were obtained using MRA technique without contrast. COMPARISON:  CTA head and neck 08/09/2016 FINDINGS: MRI HEAD FINDINGS Brain: The midline structures are normal. There is focal diffusion restriction within the right caudate tail of and extending inferiorly to involve the posterior limb of the right internal capsule. There is no hemorrhage. No mass effect. There are additional punctate foci of diffusion restriction within the left parietal lobe and right occipital lobe. There is beginning confluent hyperintense T2-weighted signal within the periventricular white matter, most often seen in the setting of chronic microvascular ischemia. No mass lesion. No chronic microhemorrhage or cerebral amyloid angiopathy. No hydrocephalus, age advanced atrophy or lobar predominant volume loss. No dural abnormality or extra-axial collection. Skull and upper cervical spine: The visualized skull base, calvarium, upper cervical spine and extracranial soft tissues are normal. Sinuses/Orbits: No fluid levels or advanced mucosal thickening. No mastoid effusion. Normal orbits. MRA HEAD FINDINGS Intracranial internal carotid arteries: Normal. Anterior cerebral arteries: Normal. Middle cerebral arteries: Normal. Posterior communicating arteries: Present  bilaterally. Posterior cerebral arteries: Normal. Basilar artery: Normal. Vertebral arteries: Left dominant. Normal. Superior cerebellar arteries: Normal. Anterior inferior cerebellar arteries: Not clearly seen, which is not uncommon. Posterior inferior cerebellar arteries: Normal. IMPRESSION: 1. Acute ischemia of the right caudate tail extending to the posterior limb of the right internal capsule, in keeping with acute left-sided weakness. 2. Punctate foci of acute ischemia within the left parietal lobe and at the right temporal occipital junction. The distribution of lesions suggests a central cardioembolic process. 3. No hemorrhage or mass effect. 4. Chronic microvascular ischemia. 5. Normal intracranial MRA. Electronically Signed   By: Deatra Robinson M.D.   On: 08/10/2016 04:10   Mr Maxine Glenn Head/brain BB Cm  Result Date: 08/10/2016 CLINICAL DATA:  Acute left-sided weakness EXAM: MRI HEAD WITHOUT CONTRAST MRA HEAD WITHOUT CONTRAST TECHNIQUE: Multiplanar, multiecho pulse sequences of the brain and surrounding structures were obtained without intravenous contrast. Angiographic images of the head were obtained using MRA technique without contrast. COMPARISON:  CTA head and neck 08/09/2016 FINDINGS: MRI HEAD FINDINGS Brain: The midline structures are normal. There is focal diffusion restriction within the right caudate tail of and extending inferiorly to involve the posterior limb of the right internal capsule. There is no hemorrhage. No mass effect. There are additional punctate foci of diffusion restriction within the left parietal lobe and right occipital lobe. There is beginning confluent hyperintense T2-weighted signal within the periventricular white matter, most  often seen in the setting of chronic microvascular ischemia. No mass lesion. No chronic microhemorrhage or cerebral amyloid angiopathy. No hydrocephalus, age advanced atrophy or lobar predominant volume loss. No dural abnormality or extra-axial  collection. Skull and upper cervical spine: The visualized skull base, calvarium, upper cervical spine and extracranial soft tissues are normal. Sinuses/Orbits: No fluid levels or advanced mucosal thickening. No mastoid effusion. Normal orbits. MRA HEAD FINDINGS Intracranial internal carotid arteries: Normal. Anterior cerebral arteries: Normal. Middle cerebral arteries: Normal. Posterior communicating arteries: Present bilaterally. Posterior cerebral arteries: Normal. Basilar artery: Normal. Vertebral arteries: Left dominant. Normal. Superior cerebellar arteries: Normal. Anterior inferior cerebellar arteries: Not clearly seen, which is not uncommon. Posterior inferior cerebellar arteries: Normal. IMPRESSION: 1. Acute ischemia of the right caudate tail extending to the posterior limb of the right internal capsule, in keeping with acute left-sided weakness. 2. Punctate foci of acute ischemia within the left parietal lobe and at the right temporal occipital junction. The distribution of lesions suggests a central cardioembolic process. 3. No hemorrhage or mass effect. 4. Chronic microvascular ischemia. 5. Normal intracranial MRA. Electronically Signed   By: Ulyses Jarred M.D.   On: 08/10/2016 04:10   Ct Head Code Stroke W/o Cm  Addendum Date: 08/10/2016   ADDENDUM REPORT: 08/10/2016 00:13 ADDENDUM: These results were called by telephone at the time of interpretation on 08/10/2016 at 11:58 p.m. to Dr. Kerney Elbe, who verbally acknowledged these results. Electronically Signed   By: Ulyses Jarred M.D.   On: 08/10/2016 00:13   Result Date: 08/10/2016 CLINICAL DATA:  Code stroke.  Left-sided weakness EXAM: CT HEAD WITHOUT CONTRAST TECHNIQUE: Contiguous axial images were obtained from the base of the skull through the vertex without intravenous contrast. COMPARISON:  Head CT 05/14/2015 FINDINGS: Brain: No mass lesion, intraparenchymal hemorrhage or extra-axial collection. No evidence of acute cortical infarct. There  is periventricular hypoattenuation compatible with chronic microvascular disease. Vascular: No hyperdense vessel or unexpected calcification. Skull: Normal visualized skull base, calvarium and extracranial soft tissues. Sinuses/Orbits: No sinus fluid levels or advanced mucosal thickening. No mastoid effusion. Normal orbits. ASPECTS [facility] Stroke Program Early CT Score) - Ganglionic level infarction (caudate, lentiform nuclei, internal capsule, insula, M1-M3 cortex): 7 - Supraganglionic infarction (M4-M6 cortex): 3 Total score (0-10 with 10 being normal): 10 IMPRESSION: 1. 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 2. ASPECTS is 10. Electronically Signed: By: Ulyses Jarred M.D. On: 08/09/2016 23:47       Medical Problem List and Plan: 1.  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  DVT Prophylaxis/Anticoagulation: Subcutaneous Lovenox. Venous Doppler studies negative 3. Pain Management: Ultram as needed 4. Mood: Provide emotional support 5. Neuropsych: This patient is capable of making decisions on his own behalf. 6. Skin/Wound Care: Routine skin checks 7. Fluids/Electrolytes/Nutrition: Routine I&O's with follow-up chemistries 8. Rheumatoid arthritis.Arava 20 mg daily, chronic prednisone 5 mg daily 9. Hyperlipidemia. Lipitor 10. History of diverticulosis with recurrent GI bleed status post partial colectomy in the past.    Post Admission Physician Evaluation: 1. Functional deficits secondary  to Right caudate/PLIC infarct. 2. Patient is admitted to receive collaborative, interdisciplinary care between the physiatrist, rehab nursing staff, and therapy team. 3. Patient's level of medical complexity and substantial therapy needs in context of that medical necessity cannot be provided at a lesser intensity of care such as a SNF. 4. Patient has experienced  substantial functional loss from his/her baseline which  was documented above under the "Functional History" and "Functional Status" headings.  Judging by the patient's diagnosis, physical exam, and functional history, the patient has potential for functional progress which will result in measurable gains while on inpatient rehab.  These gains will be of substantial and practical use upon discharge  in facilitating mobility and self-care at the household level. 5. Physiatrist will provide 24 hour management of medical needs as well as oversight of the therapy plan/treatment and provide guidance as appropriate regarding the interaction of the two. 6. The Preadmission Screening has been reviewed and patient status is unchanged unless otherwise stated above. 7. 24 hour rehab nursing will assist with bladder management, bowel management, safety, skin/wound care, disease management, medication administration, pain management and patient education  and help integrate therapy concepts, techniques,education, etc. 8. PT will assess and treat for/with: Lower extremity strength, range of motion, stamina, balance, functional mobility, safety, adaptive techniques and equipment, NMR, family ed.   Goals are: mod I to supervision. 9. OT will assess and treat for/with: ADL's, functional mobility, safety, upper extremity strength, adaptive techniques and equipment, NMR, family ed.   Goals are: mod I to supervision. Therapy may proceed with showering this patient. 10. SLP will assess and treat for/with: n/a.  Goals are: n/a. 11. Case Management and Social Worker will assess and treat for psychological issues and discharge planning. 12. Team conference will be held weekly to assess progress toward goals and to determine barriers to discharge. 13. Patient will receive at least 3 hours of therapy per day at least 5 days per week. 14. ELOS: 10-14 days       15. Prognosis:  excellent     Meredith Staggers, MD, [facility] 08/11/2016  Cathlyn Parsons., PA-C 08/11/2016

## 2016-08-11 NOTE — Progress Notes (Signed)
Physical Therapy Treatment Patient Details Name: [name] MRN: [id number] DOB: 04-Dec-1940 Today's Date: 08/11/2016    History of Present Illness Pt is a 76 y/o male admitted from home secondary to acute L sided weakness in which he fell to the floor. In ED, head CT revealed chronic microvascular ischemia. Neurology has been consulted. PMH including but not limited to prostate cancer, RA and hx of bilateral TKA in 2003.    PT Comments    Pt making good progress with mobility and able to ambulate 48' with RW and mod A. Pt with LOB x2 requiring mod A x2 to maintain upright position. Pt would continue to benefit from skilled physical therapy services at this time while admitted and after d/c to address the below listed limitations in order to improve overall safety and independence with functional mobility.    Follow Up Recommendations  CIR;Supervision/Assistance - 24 hour     Equipment Recommendations  None recommended by PT;Other (comment) (defer to next venue)    Recommendations for Other Services       Precautions / Restrictions Precautions Precautions: Fall Restrictions Weight Bearing Restrictions: No    Mobility  Bed Mobility               General bed mobility comments: pt sitting OOB in recliner chair when therapist entered room.   Transfers Overall transfer level: Needs assistance Equipment used: Rolling walker (2 wheeled) Transfers: Sit to/from Stand Sit to Stand: Mod assist         General transfer comment: increased time, VC'ing for bilateral hand placement and mod A for stability with transition from chair to standing.  Ambulation/Gait Ambulation/Gait assistance: Mod assist;+2 safety/equipment;+2 physical assistance Ambulation Distance (Feet): 50 Feet Assistive device: Rolling walker (2 wheeled) Gait Pattern/deviations: Step-through pattern;Decreased step length - right;Decreased step length - left;Decreased stride length;Ataxic;Trunk  flexed;Drifts right/left Gait velocity: decreased Gait velocity interpretation: Below normal speed for age/gender General Gait Details: pt very unsteady with ambulation requiring constant mod A with safety with use of RW. pt also with LOB x2 towards his L side that required mod A x2 to maintain upright standing.   Stairs            Wheelchair Mobility    Modified Rankin (Stroke Patients Only) Modified Rankin (Stroke Patients Only) Pre-Morbid Rankin Score: No symptoms Modified Rankin: Moderately severe disability     Balance Overall balance assessment: Needs assistance Sitting-balance support: Bilateral upper extremity supported;Feet supported Sitting balance-Leahy Scale: Fair   Postural control: Left lateral lean Standing balance support: Bilateral upper extremity supported;During functional activity Standing balance-Leahy Scale: Poor Standing balance comment: Pt continues to demonstrate L lateral lean, reliant on RW for balance                            Cognition Arousal/Alertness: Awake/alert Behavior During Therapy: WFL for tasks assessed/performed Overall Cognitive Status: Impaired/Different from baseline Area of Impairment: Safety/judgement                         Safety/Judgement: Decreased awareness of safety;Decreased awareness of deficits            Exercises      General Comments General comments (skin integrity, edema, etc.): Pt's wife present for session      Pertinent Vitals/Pain Pain Assessment: No/denies pain    Home Living  Prior Function            PT Goals (current goals can now be found in the care plan section) Acute Rehab PT Goals Patient Stated Goal: to return to independence PT Goal Formulation: With patient/family Time For Goal Achievement: 08/24/16 Potential to Achieve Goals: Good Progress towards PT goals: Progressing toward goals    Frequency    Min 4X/week       PT Plan Current plan remains appropriate    Co-evaluation PT/OT/SLP Co-Evaluation/Treatment: Yes Reason for Co-Treatment: For patient/therapist safety;To address functional/ADL transfers PT goals addressed during session: Mobility/safety with mobility;Balance;Proper use of DME OT goals addressed during session: ADL's and self-care     End of Session Equipment Utilized During Treatment: Gait belt Activity Tolerance: Patient tolerated treatment well Patient left: in chair;with call bell/phone within reach;with chair alarm set;with family/visitor present Nurse Communication: Mobility status;Need for lift equipment PT Visit Diagnosis: Other abnormalities of gait and mobility (R26.89);Other symptoms and signs involving the nervous system (R29.898)     Time: 1470-9295 PT Time Calculation (min) (ACUTE ONLY): 23 min  Charges:  $Gait Training: 8-22 mins                    G Codes:       [place], [place], [place] 08/11/2016, 3:22 PM

## 2016-08-11 NOTE — Progress Notes (Signed)
STROKE TEAM PROGRESS NOTE   HISTORY OF PRESENT ILLNESS (per record) [name] is an 76 y.o. male who was eating dinner tonight when he suddenly sneezed and then became weak on his left side. The weakness involved the arm and leg; when first responder arrived, a left facial droop was also noted. The patient denies having sensory changes on the left. He also denies headache, neck pain, chest pain, abdominal pain or limb pain. He has not been confused and speech was not affected. He was LKW 08/09/2016 at 2210. Patient was not administered IV t-PA based upon risk benefit profile, the patient elected not to undergo tPA administration. He was admitted for further evaluation and treatment.   SUBJECTIVE (INTERVAL HISTORY) Wife is at bedside. Cardiology EP service reviewed his EKG and found that he had afib on EKG in 2012. However at that time, he had active GIB and CHADS2 score was 0 at that time. However, now with stroke, he should be considered anticoagulation candidate. Dr. [name] discussed with Dr. Verlon Au today and agreed with eliquis.     OBJECTIVE Temp:  [97.4 F (36.3 C)-98.5 F (36.9 C)] 98.2 F (36.8 C) (04/27 0520) Pulse Rate:  [61-70] 61 (04/27 0520) Cardiac Rhythm: Normal sinus rhythm (04/27 0743) Resp:  [19-20] 20 (04/27 0520) BP: (155-190)/(67-91) 174/73 (04/27 0520) SpO2:  [95 %-100 %] 97 % (04/27 0520)  CBC:   Recent Labs Lab 08/09/16 2322 08/09/16 2327  WBC 8.8  --   NEUTROABS 3.8  --   HGB 10.2* 11.9*  HCT 35.0* 35.0*  MCV 76.4*  --   PLT 264  --     Basic Metabolic Panel:   Recent Labs Lab 08/09/16 2322 08/09/16 2327  NA 138 140  K 3.5 3.5  CL 108 106  CO2 24  --   GLUCOSE 104* 101*  BUN 14 17  CREATININE 1.10 1.00  CALCIUM 8.6*  --     Lipid Panel:     Component Value Date/Time   CHOL 140 08/10/2016 0737   TRIG 109 08/10/2016 0737   HDL 48 08/10/2016 0737   CHOLHDL 2.9 08/10/2016 0737   VLDL 22 08/10/2016 0737   LDLCALC 70 08/10/2016 0737    HgbA1c:  Lab Results  Component Value Date   HGBA1C 5.8 (H) 08/10/2016   Urine Drug Screen: No results found for: LABOPIA, COCAINSCRNUR, LABBENZ, AMPHETMU, THCU, LABBARB  Alcohol Level No results found for: [place] I have personally reviewed the radiological images below and agree with the radiology interpretations.  Ct Head Code Stroke W/o Cm 08/10/2016 1. 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  2. ASPECTS is 10.   Ct Angio Head W Or Wo Contrast Ct Angio Neck W And/or Wo Contrast 08/10/2016 1. No emergent intracranial large vessel occlusion.  2. 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  3. Otherwise, no hemodynamically significant stenosis of the major cervical arteries.   Mr Brain Wo Contrast 08/10/2016 1. Acute ischemia of the right caudate tail extending to the posterior limb of the right internal capsule, in keeping with acute left-sided weakness.  2. Punctate foci of acute ischemia within the left parietal lobe and at the right temporal occipital junction. The distribution of lesions suggests a central cardioembolic process.  3. No hemorrhage or mass effect.  4. Chronic microvascular ischemia.   Mr Jodene Nam Head/brain Wo Cm 08/10/2016 5. Normal intracranial MRA.   LE venous dopplers There is no DVT or SVT noted  in the bilateral lower extremities.   TTE 08/11/2016 Study Conclusions - Left ventricle: The cavity size was normal. Wall thickness was   normal. Systolic function was normal. The estimated ejection   fraction was in the range of 60% to 65%. Wall motion was normal;   there were no regional wall motion abnormalities. Doppler   parameters are consistent with abnormal left ventricular   relaxation (grade 1 diastolic dysfunction). - Aortic valve: There was mild regurgitation. - Mitral valve: There was mild regurgitation. - Left atrium: The atrium was mildly to moderately  dilated. - Right atrium: The atrium was mildly dilated.   PHYSICAL EXAM  Temp:  [97.4 F (36.3 C)-98.5 F (36.9 C)] 98.2 F (36.8 C) (04/27 0520) Pulse Rate:  [61-70] 61 (04/27 0520) Resp:  [19-20] 20 (04/27 0520) BP: (155-190)/(67-91) 174/73 (04/27 0520) SpO2:  [95 %-100 %] 97 % (04/27 0520)  General - Well nourished, well developed, in no apparent distress.  Ophthalmologic - Fundi not visualized.  Cardiovascular - Regular rate and rhythm.  Mental Status -  Level of arousal and orientation to time, place, and person were intact. Language including expression, naming, repetition, comprehension was assessed and found intact. Fund of Knowledge was assessed and was intact.  Cranial Nerves II - XII - II - Visual field intact OU. III, IV, VI - Extraocular movements intact. V - Facial sensation intact bilaterally. VII - right facial droop. VIII - Hearing & vestibular intact bilaterally. X - Palate elevates symmetrically. XI - Chin turning & shoulder shrug intact bilaterally. XII - Tongue protrusion intact.  Motor Strength - The patient's strength was normal in RUE and RLE, but 4/5 LUE and 4+/5 LLE and pronator drift was absent on the left.  Bulk was normal and fasciculations were absent.   Motor Tone - Muscle tone was assessed at the neck and appendages and was normal.  Reflexes - The patient's reflexes were 1+ in all extremities and he had no pathological reflexes.  Sensory - Light touch, temperature/pinprick were assessed and were symmetrical.    Coordination - The patient had normal movements in the hands with no ataxia or dysmetria.  Tremor was absent.  Gait and Station - deferred.   ASSESSMENT/PLAN [name] is a 76 y.o. 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 He did not receive IV t-PA  as based upon risk benefit profile, the patient elected not to undergo tPA administration.   Stroke:  Bilateral (R caudate/PLIC, L parietal and R temporal occipital jxn) infarct involving right MCA/PCA, MCA, left MCA/ACA, embolic secondary to unknown source after violent sneeze  Resultant  Left facial droop and left hemiparesis  Code Stroke CT no acute stroke. small vessel disease. Aspects 10.    CTA head/neck no ELVO. Severe narrowing L VA.   MRI  R caudate/PLIC infarct. punctate L parietal and R temporal occipital jxn infarcts. small vessel disease   MRA  Unremarkable   LE Doppler negative for DVT   2D Echo - EF - 60% to 65%. No cardiac source of emboli identified.   Cardiology EP confirmed afib on EKG in 2012.   LDL 70  HgbA1c - 5.8  Lovenox 40 mg sq daily for VTE prophylaxis Diet Heart Room service appropriate? Yes; Fluid consistency: Thin  No antithrombotic prior to admission, now on aspirin 325 mg daily. Discussed with GI Dr.  Johnson and agree with eliquis 5mg  bid.  Patient counseled to be compliant with his antithrombotic medications  Ongoing aggressive stroke risk factor management  Therapy recommendations:  CIR  Disposition:  pending   pAfib  Confirmed by cardiology EP that afib on EKG in 2012  Recommend eliquis 5mg  bid for stroke prevention  GI Dr. [name] agrees with that  Hx of LGIB  2012 s/p coiling at colon  2013, 2014 and 2015 recurrent LGIB s/p multiple blood transfusion  2015 s/p colon resection  No more LGIB since  Discussed with Dr. [name], and agree with eliquis 5mg  bid.  Hypertension  No hx HTN  As high as 207/79 Permissive hypertension (OK if < 220/120) but gradually normalize in 5-7 days Long-term BP goal normotensive  Hyperlipidemia  Home meds:  No statin  LDL 70, goal < 70  Now on lipitor 40 daily  Continue statin at discharge  Other Stroke Risk Factors  Advanced age  UDS / ETOH level  not performed   Other  Active Problems  Lumbar spinal stenosis - [facility] Dr. Stark [facility]  Fe deficiency anemia  RA on low dose prednisone  Hiatal hernia - recent flare - follows with Dr. [name] [facility] day # 1  Neurology will sign off. Please call with questions. Pt will follow up with Dr. Leta Baptist at [facility] in about 6 weeks. Thanks for the consult.  Rosalin Hawking, MD PhD Stroke Neurology 08/11/2016 3:59 PM   To contact Stroke Continuity provider, please refer to [email or web address] After hours, contact General Neurology

## 2016-08-11 NOTE — Progress Notes (Signed)
Patient and family were informed about rehab process including patient safety plan and rehab booklet. 

## 2016-08-12 ENCOUNTER — Inpatient Hospital Stay (HOSPITAL_COMMUNITY): Payer: PPO

## 2016-08-12 ENCOUNTER — Inpatient Hospital Stay (HOSPITAL_COMMUNITY): Payer: PPO | Admitting: Physical Therapy

## 2016-08-12 DIAGNOSIS — I63512 Cerebral infarction due to unspecified occlusion or stenosis of left middle cerebral artery: Secondary | ICD-10-CM

## 2016-08-12 DIAGNOSIS — G8191 Hemiplegia, unspecified affecting right dominant side: Secondary | ICD-10-CM

## 2016-08-12 MED ORDER — APIXABAN 5 MG PO TABS
5.0000 mg | ORAL_TABLET | Freq: Two times a day (BID) | ORAL | Status: DC
  Administered 2016-08-12 – 2016-08-17 (×11): 5 mg via ORAL
  Filled 2016-08-12 (×10): qty 1

## 2016-08-12 NOTE — Evaluation (Signed)
Occupational Therapy Assessment and Plan  Patient Details  Name: [name] MRN: [id number] Date of Birth: [birth year]  OT Diagnosis: muscle weakness (generalized) Rehab Potential: Rehab Potential (ACUTE ONLY): Good ELOS: 7-10 days   Today's Date: 08/12/2016 OT Individual Time: 1100-1200 OT Individual Time Calculation (min): 60 min     Problem List:  Patient Active Problem List   Diagnosis Date Noted  . 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 Acute ischemic stroke ([place]) 08/10/2016  . Hypertensive urgency 08/10/2016  . 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 Acute left-sided weakness   . Left-sided weakness   . History of lower GI bleeding   . Hiatal hernia   . 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 Acute post-hemorrhagic anemia 02/03/2013  . Rheumatoid arthritis ([place]) 04/14/2011  . Long-term current use of steroids 04/14/2011  . Iron deficiency anemia 04/14/2011    Past Medical History:  Past Medical History:  Diagnosis Date  . Blood transfusion    " no reaction from transfusion"  . BPH (benign prostatic hyperplasia)   . Bruises easily   . Bursitis    chronic hip pain  . Cancer [facility])    prostate cancer cells  . Disc disease, degenerative, lumbar or lumbosacral    Chronic back pain now  . Diverticulitis   . Esophageal ring    requiring dilations   . GERD (gastroesophageal reflux disease)   . History of GI bleed    "vessels burst in colon" x2  . History of kidney stones    chronic  . History of vertigo   . Osteoarthritis   . Osteoporosis   . Pneumonia    yrs ago  . Rash    GROIN - ITCHING PAST 3 MONTHS - it is gone now 05/02/16  . Rheumatoid arthritis(714.0)    27 years  . Spondylisthesis   . Ureteral stone 11/2012   Past Surgical History:  Past Surgical History:  Procedure Laterality Date  . BALLOON  DILATION N/A 10/01/2012   Procedure: BALLOON DILATION;  Surgeon: Garlan Fair, MD;  Location: [facility];  Service: Endoscopy;  Laterality: N/A;  . COLECTOMY N/A 06/10/2013   Procedure: TOTAL abdominal COLECTOMY;  Surgeon: Odis Hollingshead, MD;  Location: [facility];  Service: General;  Laterality: N/A;  . COLONOSCOPY    . CYSTOSCOPY WITH URETEROSCOPY Right 12/05/2012   Procedure: CYSTOSCOPY WITH RIGHT URETEROSCOPY AND STONE EXTRACTION;  Surgeon: Malka So, MD;  Location: [facility];  Service: Urology;  Laterality: Right;  . ESOPHAGOGASTRODUODENOSCOPY N/A 10/01/2012   Procedure: ESOPHAGOGASTRODUODENOSCOPY (EGD);  Surgeon: Garlan Fair, MD;  Location: [facility];  Service: Endoscopy;  Laterality: N/A;  . HERNIA REPAIR    . INGUINAL HERNIA REPAIR  2007   Bilateral   . Lithotripsy for nephrolithiasis     Pt and wife not sure when but here in [place]  . Lysis of Adhesion, small bowel resection  2009  . MAXIMUM ACCESS (MAS)POSTERIOR LUMBAR INTERBODY FUSION (PLIF) 1 LEVEL N/A 05/08/2016   Procedure: LUMBAR FOUR-FIVE FOR MAXIMUM ACCESS (MAS) POSTERIOR LUMBAR INTERBODY FUSION;  Surgeon: Kary Kos, MD;  Location: [facility];  Service: Neurosurgery;  Laterality: N/A;  . NISSEN FUNDOPLICATION     about 1443, pt not sure records not currently available  . TOTAL KNEE ARTHROPLASTY  2003   Bilateral  . TRANSURETHRAL PROSTATECTOMY WITH GYRUS INSTRUMENTS N/A 12/05/2012   Procedure:  TRANSURETHRAL PROSTATECTOMY WITH GYRUS INSTRUMENTS;  Surgeon: Malka So, MD;  Location: [facility];  Service: Urology;  Laterality: N/A;  . Ventral Hernia Repair with Mesh      Assessment & Plan Clinical Impression: Patient is a 76 y.o. 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 Per chart review patient lives with spouse and was independent prior to  admission. Two-level home with bedroom upstairs. Presented 08/10/2016 with left-sided weakness of acute onset resulting in a fall with no loss of consciousness. Blood pressure mildly elevated 170/100. CT/MRI showed acute ischemia of the right caudate tail extending to the posterior limb of the right internal capsule. Punctate foci of acute ischemia within the left parietal lobe and at the right temporal occipital junction. MRA was unremarkable. Patient did not receive TPA. CT angiogram head and neck showed narrowing emergent intracranial large vessel occlusion. 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 Echocardiogram with EF 40% grade 1 diastolic dysfunction. No current plan for TEE secondary significant GI history.Venous Dopplers lower extremities negative for DVT. Neurology consulted currently on aspirin for CVA prophylaxis. Subcutaneous Lovenox for DVT prophylaxis..    Patient transferred to CIR on 08/11/2016.    Patient currently requires min- moderate assist with basic self-care skills secondary to unbalanced muscle activation and decreased coordination.  Prior to hospitalization, patient could complete BADL independently.   Patient will benefit from skilled intervention to increase independence with basic self-care skills prior to discharge home with care partner.  Anticipate patient will require intermittent supervision and no further OT follow recommended.  OT - End of Session Activity Tolerance: Tolerates 30+ min activity with multiple rests Endurance Deficit: Yes OT Assessment Rehab Potential (ACUTE ONLY): Good OT Patient demonstrates impairments in the following area(s): Balance;Motor;Sensory;Safety OT Basic ADL's Functional Problem(s): Grooming;Bathing;Dressing;Toileting OT Transfers Functional Problem(s): Toilet;Tub/Shower OT Additional Impairment(s): Fuctional Use of Upper Extremity OT Plan OT Intensity: Minimum of 1-2 x/day, 45 to 90  minutes OT Frequency: 5 out of 7 days OT Duration/Estimated Length of Stay: 7-10 days OT Treatment/Interventions: Balance/vestibular training;Discharge planning;DME/adaptive equipment instruction;Functional mobility training;Therapeutic Activities;UE/LE Strength taining/ROM;UE/LE Coordination activities;Therapeutic Exercise;Self Care/advanced ADL retraining;Patient/family education OT Self Feeding Anticipated Outcome(s): Mod I OT Basic Self-Care Anticipated Outcome(s): Mod I OT Toileting Anticipated Outcome(s): Mod I OT Bathroom Transfers Anticipated Outcome(s): Mod I OT Recommendation Patient destination: Home Follow Up Recommendations: None Equipment Recommended: To be determined   Skilled Therapeutic Intervention OT Initial evaluation completed with treatment provided to address pt/family ed on treatment methods, goals, role of OT, use of DME, safety policies, and fall risk.    With setup and intermittent mod assist to maintain dynamic balance, pt completed bathing/dressing at shower level.   Pt demo'd LOB 2 times during dynamic standing task but minimizes balance deficits although acknowledging weakness at left leg and arm, particularly when fatigued.   Pt bathed at shower level, dressed sitting on BSC and recovered to w/c at end of session d/t fatigue.  Pt setup for self-feeding.  Wife present throughout session.  OT Evaluation Precautions/Restrictions  Precautions Precautions: Fall Precaution Comments: Mild L hemiparesis, LLE fatigues with ambulation Restrictions Weight Bearing Restrictions: No  General Chart Reviewed: Yes Family/Caregiver Present: Yes  Vital Signs Therapy Vitals Temp: 97.8 F (36.6 C) Temp Source: Oral Pulse Rate: 64 Resp: 18 BP: (!) 132/57 Patient Position (if appropriate): Lying Oxygen Therapy SpO2: 97 % O2 Device: Not Delivered  Pain Pain Assessment Pain Assessment: No/denies pain Pain Score: 0-No pain  Home Living/Prior Functioning Home  Living Available Help at Discharge: Family, Available 24 hours/day Type of Home: House Home Access: Level entry Home Layout: Two level, Bed/bath upstairs Alternate Level Stairs-Number of Steps: 14 steps Alternate Level Stairs-Rails: Left Bathroom Shower/Tub: Tub only (whirlpool upstairs, tub/shower at main level.  Prefers whirlpool d/t RA.) Bathroom Toilet: Handicapped height Bathroom Accessibility: Yes  Lives With: Spouse IADL History Homemaking Responsibilities: Yes Meal Prep Responsibility: No Laundry Responsibility: No Cleaning Responsibility: No Bill Paying/Finance Responsibility: Secondary Shopping Responsibility: Secondary Child Care Responsibility: No Current License: Yes Mode of Transportation:  ([place], [place] 2017) Education: HS Occupation: Retired Type of Occupation: Chief Financial Officer Leisure and Hobbies: Golf,  Prior Function Level of Independence: Independent with gait, Independent with transfers  Able to Take Stairs?: Yes Driving: Yes Vocation: Retired Comments: Drives, Microbiologist. Active.  ADL ADL ADL Comments: see Functional Assessment Tool  Vision/Perception  Perception Perception: Within Functional Limits Praxis Praxis: Intact   Cognition Arousal/Alertness: (P) Awake/alert Orientation Level: (P) Person;Place Year: (P) 2018 Month: (P) April Day of Week: (P) Correct Memory: (P) Impaired Memory Impairment: (P) Storage deficit;Retrieval deficit Immediate Memory Recall: Sock;Blue;Bed Memory Recall: Sock;Blue;Bed Memory Recall Sock: Without Cue Memory Recall Blue: Without Cue Memory Recall Bed: With Cue Attention: (P) Sustained Sustained Attention: (P) Appears intact Awareness: (P) Appears intact Problem Solving: (P) Appears intact Safety/Judgment: (P) Appears intact  Sensation Sensation Light Touch: Impaired Detail Light Touch Impaired Details: Impaired LLE;Impaired RLE Stereognosis: Appears Intact Hot/Cold: Appears  Intact Proprioception: Appears Intact Coordination Gross Motor Movements are Fluid and Coordinated: No Fine Motor Movements are Fluid and Coordinated: Yes Coordination and Movement Description: Phillips bilateral hands sufficient to perform BADL Finger Nose Finger Test: mild decrease in speed on L Heel Shin Test: R=L normal  Motor  Motor Motor: Hemiplegia;Abnormal postural alignment and control  Mobility  Bed Mobility Bed Mobility: Sit to Supine;Supine to Sit Supine to Sit: 5: Supervision Sit to Supine: 5: Supervision Transfers Sit to Stand: 3: Mod assist Stand to Sit: 3: Mod assist   Trunk/Postural Assessment  Cervical Assessment Cervical Assessment: Within Functional Limits Thoracic Assessment Thoracic Assessment: Within Functional Limits Lumbar Assessment Lumbar Assessment: Within Functional Limits Postural Control Postural Control: Deficits on evaluation Protective Responses: intact in sitting, but delayed and insufficient in standing Postural Limitations: decreased in standing   Balance Balance Balance Assessed: Yes Standardized Balance Assessment Standardized Balance Assessment: Berg Balance Test Berg Balance Test Sit to Stand: Able to stand  independently using hands Standing Unsupported: Able to stand 2 minutes with supervision Sitting with Back Unsupported but Feet Supported on Floor or Stool: Able to sit safely and securely 2 minutes Stand to Sit: Sits safely with minimal use of hands Transfers: Able to transfer safely, definite need of hands Standing Unsupported with Eyes Closed: Able to stand 3 seconds Standing Ubsupported with Feet Together: Needs help to attain position but able to stand for 30 seconds with feet together From Standing, Reach Forward with Outstretched Arm: Can reach forward >5 cm safely (2") From Standing Position, Pick up Object from Floor: Able to pick up shoe, needs supervision From Standing Position, Turn to Look Behind Over each Shoulder:  Looks behind one side only/other side shows less weight shift Turn 360 Degrees: Needs close supervision or verbal cueing Standing Unsupported, Alternately Place Feet on Step/Stool: Needs assistance to keep from falling or unable to try Standing Unsupported, One Foot in Front: Loses balance while stepping or standing  Standing on One Leg: Unable to try or needs assist to prevent fall Total Score: 29 Dynamic Sitting Balance Sitting balance - Comments: pt requiring assistance and support of recliner chair for UE testing  Extremity/Trunk Assessment RUE Assessment RUE Assessment: Within Functional Limits LUE Assessment LUE Assessment: Exceptions to [facility] LUE Strength LUE Overall Strength: Deficits (Grossly 4/5 at left UE)   See Function Navigator for Current Functional Status.   Refer to Care Plan for Long Term Goals  Recommendations for other services: None    Discharge Criteria: Patient will be discharged from OT if patient refuses treatment 3 consecutive times without medical reason, if treatment goals not met, if there is a change in medical status, if patient makes no progress towards goals or if patient is discharged from hospital.  The above assessment, treatment plan, treatment alternatives and goals were discussed and mutually agreed upon: by patient and by family  [facility] 08/12/2016, 12:54 PM

## 2016-08-12 NOTE — Progress Notes (Signed)
[name] [facility] Admission Coordinator Signed Physical Medicine and Rehabilitation  PMR Pre-admission Date of Service: 08/11/2016 1:45 PM  Related encounter: ED to Hosp-Admission (Discharged) from 08/09/2016 in [place]       [] Hide copied text PMR Admission Coordinator Pre-Admission Assessment  Patient: [name] is an 76 y.o., male MRN: [id number] DOB: 1940/05/12 Height: 5\' 7"  (170.2 cm) Weight: 80.6 kg (177 lb 11.2 oz)                                                                                                                                                  Insurance Information HMO:     PPO: X     PCP:      IPA:      80/20:      OTHER:  PRIMARY: [employer]       Policy#: [id number]      Subscriber: Self CM Name: [name]      Phone#: [phone]     Fax#: [phone] Pre-Cert#: [id number], for 7 days with case managers to follow up via EMR access      Employer: Retired Benefits:  Phone #: [phone]     Name: automated phone line Eff. Date: 04/17/16     Deduct: $0      Out of Pocket Max: $3100      Life Max: N/A CIR: $250 first day; $125 a day, days 2-6; $0 for any additional days SNF: $0 a day, days 1-20; $140 a day, days 21-100 Outpatient: PT/OT     Co-Pay: $10 per visit  Home Health: PT/OT      Co-Pay: $10 per visit  DME: 80%     Co-Pay: 20% Providers: in network  Medicaid Application Date:       Case Manager:  Disability Application Date:       Case Worker:   Emergency Tax adviser Information    Name Relation Home Work Mobile   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 Per chart review patient lives with spouse and was independent prior to admission. Two-level home with bedroom upstairs. Presented 08/10/2016 with left-sided weakness of acute onset resulting in a fall with no loss of consciousness. Blood pressure mildly elevated 170/100. CT/MRI showed acute ischemia of the right caudate tail extending to the posterior limb of the right internal capsule. Punctate foci of acute ischemia within the left parietal lobe and at the right temporal occipital junction. MRA was unremarkable. Patient did  not receive TPA. CT angiogram head and neck showed narrowing emergent intracranial large vessel occlusion. 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 Echocardiogram with EF 62% grade 1 diastolic dysfunction. No current plan for TEE secondary significant GI history.Venous Dopplers lower extremities negative for DVT. Neurology consulted currently on aspirin for CVA prophylaxis. Subcutaneous Lovenox for DVT prophylaxis. Physical and occupational therapy evaluationscompleted with recommendations for physical medicine rehabilitation consult.Patient was admitted for a comprehensive rehabilitation program.  NIH Total: 3  Past Medical History      Past Medical History:  Diagnosis Date  . Blood transfusion    " no reaction from transfusion"  . BPH (benign prostatic hyperplasia)   . Bruises easily   . Bursitis    chronic hip pain  . Cancer [facility])    prostate cancer cells  . Disc disease, degenerative, lumbar or lumbosacral    Chronic back pain now  . Diverticulitis   . Esophageal ring    requiring dilations   . GERD (gastroesophageal reflux disease)   . History of GI bleed    "vessels burst in colon" x2  . History of kidney stones    chronic  . History of vertigo   . Osteoarthritis   . Osteoporosis     . Pneumonia    yrs ago  . Rash    GROIN - ITCHING PAST 3 MONTHS - it is gone now 05/02/16  . Rheumatoid arthritis(714.0)    27 years  . Spondylisthesis   . Ureteral stone 11/2012    Family History  family history includes Heart failure in his father and mother. He was adopted.  Prior Rehab/Hospitalizations:  Has the patient had major surgery during 100 days prior to admission? Yes  Current Medications   Current Facility-Administered Medications:  .  acetaminophen (TYLENOL) tablet 650 mg, 650 mg, Oral, Q4H PRN **OR** acetaminophen (TYLENOL) solution 650 mg, 650 mg, Per Tube, Q4H PRN **OR** acetaminophen (TYLENOL) suppository 650 mg, 650 mg, Rectal, Q4H PRN, Ilene Qua Opyd, MD .  aspirin suppository 300 mg, 300 mg, Rectal, Daily **OR** aspirin tablet 325 mg, 325 mg, Oral, Daily, Vianne Bulls, MD, 325 mg at 08/11/16 0909 .  atorvastatin (LIPITOR) tablet 40 mg, 40 mg, Oral, q1800, Kerney Elbe, MD, 40 mg at 08/10/16 1859 .  enoxaparin (LOVENOX) injection 40 mg, 40 mg, Subcutaneous, Q24H, Ilene Qua Opyd, MD, 40 mg at 08/11/16 0909 .  labetalol (NORMODYNE,TRANDATE) injection 5-10 mg, 5-10 mg, Intravenous, Q2H PRN, Ilene Qua Opyd, MD .  leflunomide (ARAVA) tablet 20 mg, 20 mg, Oral, Daily, Ilene Qua Opyd, MD, 20 mg at 08/11/16 0908 .  naphazoline-glycerin (CLEAR EYES) ophth solution 2 drop, 2 drop, Both Eyes, QID PRN, Ilene Qua Opyd, MD .  pantoprazole (PROTONIX) EC tablet 40 mg, 40 mg, Oral, Daily, Vianne Bulls, MD, 40 mg at 08/11/16 0909 .  predniSONE (DELTASONE) tablet 5 mg, 5 mg, Oral, Daily, Ilene Qua Opyd, MD, 5 mg at 08/11/16 0909 .  senna-docusate (Senokot-S) tablet 1 tablet, 1 tablet, Oral, QHS PRN, Ilene Qua Opyd, MD .  traMADol (ULTRAM) tablet 50 mg, 50 mg, Oral, Q6H PRN, Vianne Bulls, MD  Patients Current Diet: Diet Heart Room service appropriate? Yes; Fluid consistency: Thin  Precautions / Restrictions Precautions Precautions: Fall Restrictions Weight  Bearing Restrictions: No   Has the patient had 2 or more falls or a fall with injury in the past year?No  Prior Activity Level Community (5-7x/wk): Patient is retired and lives at  home with his spouse, [name].    Home Assistive Devices / Equipment Home Assistive Devices/Equipment: None Home Equipment: Cane - single point  Prior Device Use: Indicate devices/aids used by the patient prior to current illness, exacerbation or injury? None of the above  Prior Functional Level Prior Function Level of Independence: Independent Comments: Drives, cleans. Active.  Self Care: Did the patient need help bathing, dressing, using the toilet or eating? Independent  Indoor Mobility: Did the patient need assistance with walking from room to room (with or without device)? Independent  Stairs: Did the patient need assistance with internal or external stairs (with or without device)? Independent  Functional Cognition: Did the patient need help planning regular tasks such as shopping or remembering to take medications? Independent  Current Functional Level Cognition  Arousal/Alertness: Awake/alert Overall Cognitive Status: Impaired/Different from baseline Orientation Level: Oriented X4 Safety/Judgement: Decreased awareness of safety, Decreased awareness of deficits Attention: Sustained Sustained Attention: Appears intact Memory: Impaired Memory Impairment: Storage deficit, Retrieval deficit Awareness: Appears intact Problem Solving: Appears intact (for verbal) Safety/Judgment: Appears intact    Extremity Assessment (includes Sensation/Coordination)  Upper Extremity Assessment: LUE deficits/detail LUE Deficits / Details: Dysmetria with finger-to-nose. MMT revealed 3+/5 for shoulder flexion, shoulder abduction and elbow flexion. Sensation grossly intact. Pt with decreased grasp strength (but able to get full digit extension and flexion) and decreased fine motor coordination.  Lower  Extremity Assessment: LLE deficits/detail, Defer to PT evaluation LLE Deficits / Details: MMT revealed 3+/5 for hip flexion, hip abduction and hip adduction; 4/5 for knee flexion, knee extension and ankle DF. Sensation grossly intact.    ADLs  Overall ADL's : Needs assistance/impaired Eating/Feeding: Minimal assistance, Sitting Grooming: Oral care, Moderate assistance, Sitting Grooming Details (indicate cue type and reason): Pt needed assist with stabilizing toothbrush, and assist squeezing with R hand on tube of toothpaste Upper Body Bathing: Minimal assistance, Sitting Lower Body Bathing: Moderate assistance, Sitting/lateral leans Upper Body Dressing : Minimal assistance, Sitting Lower Body Dressing: Maximal assistance, Sit to/from stand, +2 for physical assistance Toilet Transfer: Maximal assistance, +2 for physical assistance, BSC Toilet Transfer Details (indicate cue type and reason): Staff has been using STEDY which is appropriate Toileting- Clothing Manipulation and Hygiene: Maximal assistance, +2 for physical assistance Toileting - Clothing Manipulation Details (indicate cue type and reason): Pt able to reach, needs assist for sit to stand Tub/ Shower Transfer: Maximal assistance, +2 for physical assistance Functional mobility during ADLs:  (not attempted this session, STEDY used to transfer to bed) General ADL Comments: Pt VERY motivated and willing to work with therapy    Mobility  General bed mobility comments: pt sitting OOB in recliner chair when therapist entered room.     Transfers  Overall transfer level: Needs assistance Equipment used: Ambulation equipment used Transfer via Lift Equipment: Stedy Transfers: Sit to/from Stand Sit to Stand: Max assist General transfer comment: increased time, max A to rise into standing and to maintain standing as pt with heavy L sided lean, Pt able to grab onto bar with BUE    Ambulation / Gait / Stairs / Teacher, adult education / Balance Dynamic Sitting Balance Sitting balance - Comments: pt requiring assistance and support of recliner chair for UE testing Balance Overall balance assessment: Needs assistance Sitting-balance support: Feet supported Sitting balance-Leahy Scale: Poor Sitting balance - Comments: pt requiring assistance and support of recliner chair for UE testing Postural control: Left lateral lean Standing balance support: During functional activity, Single  extremity supported Standing balance-Leahy Scale: Poor Standing balance comment: max A to maintain standing, pt with heavy L lateral lean    Special needs/care consideration BiPAP/CPAP: No CPM: No Continuous Drip IV: No Dialysis: No         Life Vest: No Oxygen: No Special Bed: No Trach Size: No Wound Vac (area): No       Skin: bruising bilateral upper extremities                                Bowel mgmt: 08/10/16, continent  Bladder mgmt: Continent  Diabetic mgmt: HgbA1c - 5.8     Previous Home Environment Living Arrangements: Spouse/significant other  Lives With: Spouse Available Help at Discharge: Family, Available 24 hours/day Type of Home: House Home Layout: Two level, Bed/bath upstairs Alternate Level Stairs-Rails: Left Alternate Level Stairs-Number of Steps: 1 flight Home Access: Level entry Bathroom Shower/Tub: Tub only (jacuzzitub) Bathroom Toilet: Handicapped height Bathroom Accessibility: Yes How Accessible: Accessible via walker [place]: No  Discharge Living Setting Plans for Discharge Living Setting: Patient's home, Lives with (comment) (spouse) Type of Home at Discharge: House Discharge Home Layout: Two level, Bed/bath upstairs, Full bath on main level Alternate Level Stairs-Rails: Left Alternate Level Stairs-Number of Steps: 14 Discharge Home Access: Level entry Discharge Bathroom Shower/Tub: Tub only (main level has a tub/shower combo with a curtain) Discharge Bathroom Toilet:  Handicapped height (upstairs, standard on main level ) Discharge Bathroom Accessibility: Yes How Accessible: Accessible via walker Does the patient have any problems obtaining your medications?: No  Social/Family/Support Systems Patient Roles: Spouse, Parent, Other (Comment) (grandparent ) Contact Information: Spouse: [name] [phone] Anticipated Caregiver: spouse Anticipated Caregiver's Contact Information: see above Ability/Limitations of Caregiver: None Caregiver Availability: 24/7 Discharge Plan Discussed with Primary Caregiver: Yes Is Caregiver In Agreement with Plan?: Yes Does Caregiver/Family have Issues with Lodging/Transportation while Pt is in Rehab?: No  Goals/Additional Needs Patient/Family Goal for Rehab: PT/OT Mod I-Supervision  Expected length of stay: 10-14 days  Cultural Considerations: None Dietary Needs: Heart Healthy Restrictions  Equipment Needs: TBD Special Service Needs: None Pt/Family Agrees to Admission and willing to participate: Yes Program Orientation Provided & Reviewed with Pt/Caregiver Including Roles  & Responsibilities: Yes Additional Information Needs: Patient with history of HH that has been repaired, but currently has a loose stitch that patient missed appointment to get repaired due to CVA. Information Needs to be Provided By: FYI for Team   Decrease burden of Care through IP rehab admission: No  Possible need for SNF placement upon discharge: No  Patient Condition: This patient's condition remains as documented in the consult dated 08/10/16, in which the Rehabilitation Physician determined and documented that the patient's condition is appropriate for intensive rehabilitative care in an inpatient rehabilitation facility. Will admit to inpatient rehab today.  Preadmission Screen Completed By:  [name], 08/11/2016 1:45 PM ______________________________________________________________________   Discussed status with Dr. Naaman Plummer  on 08/11/16 at 1610 and received telephone approval for admission today.  Admission Coordinator:  [name], time 1610/Date 08/11/16       Cosigned by: Meredith Staggers, MD at 08/11/2016 4:24 PM  Revision History

## 2016-08-12 NOTE — Progress Notes (Signed)
Subjective/Complaints: No issues overnite, dropping objects like cup when trying to use L hand Pt is R handed  ROS-  No CP,SOB, N/V/D  Objective: Vital Signs: Blood pressure (!) 173/87, pulse 73, temperature 98.5 F (36.9 C), temperature source Oral, resp. rate 18, height 5' 7"  (1.702 m), weight 78.7 kg (173 lb 6.4 oz), SpO2 93 %. No results found. Results for orders placed or performed during the hospital encounter of 08/11/16 (from the past 72 hour(s))  CBC     Status: Abnormal   Collection Time: 08/11/16  7:54 PM  Result Value Ref Range   WBC 7.8 4.0 - 10.5 K/uL   RBC 4.40 4.22 - 5.81 MIL/uL   Hemoglobin 9.7 (L) 13.0 - 17.0 g/dL   HCT 33.3 (L) 39.0 - 52.0 %   MCV 75.7 (L) 78.0 - 100.0 fL   MCH 22.0 (L) 26.0 - 34.0 pg   MCHC 29.1 (L) 30.0 - 36.0 g/dL   RDW 16.5 (H) 11.5 - 15.5 %   Platelets 243 150 - 400 K/uL  Creatinine, serum     Status: None   Collection Time: 08/11/16  7:54 PM  Result Value Ref Range   Creatinine, Ser 1.10 0.61 - 1.24 mg/dL   GFR calc non Af Amer >60 >60 mL/min   GFR calc Af Amer >60 >60 mL/min    Comment: (NOTE) The eGFR has been calculated using the CKD EPI equation. This calculation has not been validated in all clinical situations. eGFR's persistently <60 mL/min signify possible Chronic Kidney Disease.      HEENT: normal Cardio: RRR and no murmur Resp: RRR and no murmur GI: BS positive and NT, ND Extremity:  No Edema Skin:   Intact Neuro: Alert/Oriented, Cranial Nerve II-XII normal, Normal Sensory, Abnormal Motor 5/5 on R sie , 4+ on Left side and Abnormal FMC Ataxic/ dec FMC Musc/Skel:  Other bilateral healed TKR incision Gen NAD   Assessment/Plan: 1. Functional deficits secondary to Right caudate /PLIC infarct which require 3+ hours per day of interdisciplinary therapy in a comprehensive inpatient rehab setting. Physiatrist is providing close team supervision and 24 hour management of active medical problems listed  below. Physiatrist and rehab team continue to assess barriers to discharge/monitor patient progress toward functional and medical goals. FIM:       Function - Toileting Toileting steps completed by patient: Adjust clothing prior to toileting, Performs perineal hygiene, Adjust clothing after toileting Toileting Assistive Devices: Grab bar or rail           Function - Comprehension Comprehension: Auditory Comprehension assist level: Understands basic 90% of the time/cues < 10% of the time  Function - Expression Expression: Verbal Expression assist level: Expresses basic needs/ideas: With no assist  Function - Social Interaction Social Interaction assist level: Interacts appropriately with others - No medications needed.  Function - Problem Solving Problem solving assist level: Solves basic problems with no assist  Function - Memory Memory assist level: Recognizes or recalls 90% of the time/requires cueing < 10% of the time Patient normally able to recall (first 3 days only): Current season, Location of own room, Staff names and faces, That he or she is in a hospital  Medical Problem List and Plan: 1.  Left hemiparesis secondary to  right caudate/PLI C, left parietal and right temporal occipital junction infarct              -CIR PT, OT, SLP evals 2.  DVT Prophylaxis/Anticoagulation: Subcutaneous Lovenox. Venous Doppler studies negative 3.  Pain Management: Ultram as needed 4. Mood: Provide emotional support 5. Neuropsych: This patient is capable of making decisions on his own behalf. 6. Skin/Wound Care: Routine skin checks 7. Fluids/Electrolytes/Nutrition: Routine I&O's with follow-up chemistries 8. Rheumatoid arthritis.Arava 20 mg daily, chronic prednisone 5 mg daily, no current joint pain or swelling, S/P B TKR 9. Hyperlipidemia. Lipitor 10. History of diverticulosis with recurrent GI bleed status post partial colectomy in the past.  LOS (Days) 1 A FACE TO FACE  EVALUATION WAS PERFORMED  [name] E 08/12/2016, 8:01 AM

## 2016-08-12 NOTE — H&P (View-Only) (Signed)
Physical Medicine and Rehabilitation Admission H&P    Chief Complaint  Patient presents with  . Code Stroke  : HPI: [name] is a 76 y.o. 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 Per chart review patient lives with spouse independent prior to admission . Two-level home with bedroom upstairs. Presented 08/10/2016 with left-sided weakness of acute onset resulting in a fall with no loss of consciousness. Blood pressure mildly elevated 170/100. CT/MRI showed acute ischemia of the right caudate tail extending to the posterior limb of the right internal capsule. Punctate foci of acute ischemia within the left parietal lobe and at the right temporal occipital junction. MRA was unremarkable. Patient did not receive TPA. CT angiogram head and neck showed narrowing emergent intracranial large vessel occlusion. 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 Echocardiogram with EF 10% grade 1 diastolic dysfunction.No current plan for TEE secondary significant GI history. Venous Dopplers lower extremities negative for DVT. Neurology consulted currently on aspirin for CVA prophylaxis. Subcutaneous Lovenox for DVT prophylaxis. Physical and occupational therapy evaluations completed with recommendations of physical medicine rehabilitation consult. Patient was admitted for a comprehensive rehabilitation program  Review of Systems  Constitutional: Negative for chills and fever.  HENT: Negative for hearing loss.   Eyes: Negative for blurred vision and double vision.  Respiratory: Negative for cough and shortness of breath.   Cardiovascular: Negative for chest pain, palpitations and leg swelling.  Gastrointestinal: Positive for constipation. Negative for nausea and vomiting.  Genitourinary:  Positive for urgency. Negative for hematuria.  Musculoskeletal: Positive for back pain and myalgias.  Skin: Negative for rash.  Neurological: Positive for weakness. Negative for seizures.       Vertigo  All other systems reviewed and are negative.  Past Medical History:  Diagnosis Date  . Blood transfusion    " no reaction from transfusion"  . BPH (benign prostatic hyperplasia)   . Bruises easily   . Bursitis    chronic hip pain  . Cancer [facility])    prostate cancer cells  . Disc disease, degenerative, lumbar or lumbosacral    Chronic back pain now  . Diverticulitis   . Esophageal ring    requiring dilations   . GERD (gastroesophageal reflux disease)   . History of GI bleed    "vessels burst in colon" x2  . History of kidney stones    chronic  . History of vertigo   . Osteoarthritis   . Osteoporosis   . Pneumonia    yrs ago  . Rash    GROIN - ITCHING PAST 3 MONTHS - it is gone now 05/02/16  . Rheumatoid arthritis(714.0)    27 years  . Spondylisthesis   . Ureteral stone 11/2012   Past Surgical History:  Procedure Laterality Date  . BALLOON DILATION N/A 10/01/2012   Procedure: BALLOON DILATION;  Surgeon: Garlan Fair, MD;  Location: [facility];  Service: Endoscopy;  Laterality: N/A;  . COLECTOMY N/A 06/10/2013   Procedure: TOTAL abdominal COLECTOMY;  Surgeon: Odis Hollingshead, MD;  Location: [facility];  Service: General;  Laterality: N/A;  . COLONOSCOPY    . CYSTOSCOPY WITH URETEROSCOPY Right 12/05/2012   Procedure: CYSTOSCOPY WITH RIGHT URETEROSCOPY AND STONE EXTRACTION;  Surgeon: Malka So, MD;  Location: [facility];  Service: Urology;  Laterality: Right;  . ESOPHAGOGASTRODUODENOSCOPY N/A 10/01/2012  Procedure: ESOPHAGOGASTRODUODENOSCOPY (EGD);  Surgeon: Garlan Fair, MD;  Location: [facility];  Service: Endoscopy;  Laterality: N/A;  . HERNIA REPAIR    . INGUINAL HERNIA REPAIR  2007   Bilateral   . Lithotripsy for nephrolithiasis     Pt and wife not sure when  but here in [place]  . Lysis of Adhesion, small bowel resection  2009  . MAXIMUM ACCESS (MAS)POSTERIOR LUMBAR INTERBODY FUSION (PLIF) 1 LEVEL N/A 05/08/2016   Procedure: LUMBAR FOUR-FIVE FOR MAXIMUM ACCESS (MAS) POSTERIOR LUMBAR INTERBODY FUSION;  Surgeon: Kary Kos, MD;  Location: [facility];  Service: Neurosurgery;  Laterality: N/A;  . NISSEN FUNDOPLICATION     about 1941, pt not sure records not currently available  . TOTAL KNEE ARTHROPLASTY  2003   Bilateral  . TRANSURETHRAL PROSTATECTOMY WITH GYRUS INSTRUMENTS N/A 12/05/2012   Procedure: TRANSURETHRAL PROSTATECTOMY WITH GYRUS INSTRUMENTS;  Surgeon: Malka So, MD;  Location: [facility];  Service: Urology;  Laterality: N/A;  . Ventral Hernia Repair with Mesh     Family History  Problem Relation Age of Onset  . Adopted: Yes  . Heart failure Mother   . Heart failure Father    Social History:  reports that he has never smoked. He has never used smokeless tobacco. He reports that he does not drink alcohol or use drugs. Allergies:  Allergies  Allergen Reactions  . Clindamycin/Lincomycin Hives, Shortness Of Breath and Swelling  . Enbrel [Etanercept] Swelling    SEVERE LEG SWELLING   . Penicillins Hives and Shortness Of Breath    Has patient had a PCN reaction causing immediate rash, facial/tongue/throat swelling, SOB or lightheadedness with hypotension: Yes Has patient had a PCN reaction causing severe rash involving mucus membranes or skin necrosis: Yes Has patient had a PCN reaction that required hospitalization No Has patient had a PCN reaction occurring within the last 10 years: No If all of the above answers are "NO", then may proceed with Cephalosporin use.   . Codeine Hives and Swelling   Medications Prior to Admission  Medication Sig Dispense Refill  . acetaminophen (TYLENOL) 650 MG CR tablet Take 650-1,300 mg by mouth 2 (two) times daily. 1300 mg in the morning & 650 mg at night    . diphenhydramine-acetaminophen (TYLENOL PM)  25-500 MG TABS tablet Take 1 tablet by mouth at bedtime.    Marland Kitchen leflunomide (ARAVA) 20 MG tablet Take 20 mg by mouth daily.     Marland Kitchen omeprazole (PRILOSEC) 20 MG capsule Take 20 mg by mouth daily.      . predniSONE (DELTASONE) 5 MG tablet Take 5 mg by mouth daily.    Marland Kitchen tetrahydrozoline-zinc (VISINE-AC) 0.05-0.25 % ophthalmic solution Place 2 drops into both eyes 3 (three) times daily as needed (for allergies).    . traMADol (ULTRAM) 50 MG tablet Take 50 mg by mouth 2 (two) times daily.      Home: Home Living Family/patient expects to be discharged to:: Private residence Living Arrangements: Spouse/significant other Available Help at Discharge: Family, Available 24 hours/day Type of Home: House Home Access: Level entry Home Layout: Two level, Bed/bath upstairs Alternate Level Stairs-Number of Steps: 1 flight Alternate Level Stairs-Rails: Left Bathroom Shower/Tub: Tub only (jacuzzitub) Bathroom Toilet: Handicapped height Bathroom Accessibility: Yes Home Equipment: Cane - single point  Lives With: Spouse   Functional History: Prior Function Level of Independence: Independent Comments: Drives, cleans. Active.  Functional Status:  Mobility: Bed Mobility General bed mobility comments: pt sitting OOB in recliner chair when therapist  entered room.  Transfers Overall transfer level: Needs assistance Equipment used: Ambulation equipment used Transfer via Lift Equipment: Stedy Transfers: Sit to/from Stand Sit to Stand: Max assist General transfer comment: increased time, max A to rise into standing and to maintain standing as pt with heavy L sided lean, Pt able to grab onto bar with BUE      ADL: ADL Overall ADL's : Needs assistance/impaired Eating/Feeding: Minimal assistance, Sitting Grooming: Oral care, Moderate assistance, Sitting Grooming Details (indicate cue type and reason): Pt needed assist with stabilizing toothbrush, and assist squeezing with R hand on tube of  toothpaste Upper Body Bathing: Minimal assistance, Sitting Lower Body Bathing: Moderate assistance, Sitting/lateral leans Upper Body Dressing : Minimal assistance, Sitting Lower Body Dressing: Maximal assistance, Sit to/from stand, +2 for physical assistance Toilet Transfer: Maximal assistance, +2 for physical assistance, BSC Toilet Transfer Details (indicate cue type and reason): Staff has been using STEDY which is appropriate Toileting- Clothing Manipulation and Hygiene: Maximal assistance, +2 for physical assistance Toileting - Clothing Manipulation Details (indicate cue type and reason): Pt able to reach, needs assist for sit to stand Tub/ Shower Transfer: Maximal assistance, +2 for physical assistance Functional mobility during ADLs:  (not attempted this session, STEDY used to transfer to bed) General ADL Comments: Pt VERY motivated and willing to work with therapy  Cognition: Cognition Overall Cognitive Status: Impaired/Different from baseline Arousal/Alertness: Awake/alert Orientation Level: Oriented X4 Attention: Sustained Sustained Attention: Appears intact Memory: Impaired Memory Impairment: Storage deficit, Retrieval deficit Awareness: Appears intact Problem Solving: Appears intact (for verbal) Safety/Judgment: Appears intact Cognition Arousal/Alertness: Awake/alert Behavior During Therapy: WFL for tasks assessed/performed Overall Cognitive Status: Impaired/Different from baseline Area of Impairment: Safety/judgement Safety/Judgement: Decreased awareness of safety, Decreased awareness of deficits  Physical Exam: Blood pressure (!) 174/73, pulse 61, temperature 98.2 F (36.8 C), temperature source Oral, resp. rate 20, height '5\' 7"'$  (1.702 m), weight 80.6 kg (177 lb 11.2 oz), SpO2 97 %. Physical Exam  Constitutional: He appears well-developed. No distress.  HENT:  Head: Normocephalic and atraumatic.  Eyes: EOM are normal. Pupils are equal, round, and reactive to light.  Left eye exhibits no discharge.  Neck: Normal range of motion. Neck supple. No tracheal deviation present. No thyromegaly present.  Cardiovascular: Normal rate and regular rhythm.  Exam reveals no friction rub.   Murmur heard. Respiratory: Effort normal and breath sounds normal. No respiratory distress.  GI: Soft. Bowel sounds are normal. He exhibits no distension. There is no tenderness. There is no rebound.   Skin. Warm and dry Neurological: He is alert and oriented to person, place, and time.  Follows commands. Fair awareness of deficits  CN grossly intact. No sensory deficits. MMT, Right:  5/5 deltoid, 5/5 bicep, 5/5 tricep, 5/5 wrist extension, 5/5 hand intrinsics.      5/5 hip flexor, 5/5 knee extension, 5/5 ankle dorsiflexion, 5/5 ankle plantaflexion, Left:  4+/5 deltoid, 4+/5 bicep, 4+/5 tricep, 4/5 wrist extension, 4/5 hand intrinsics.      4/5 hip flexor, 4/5 knee extension, 4/5 ankle dorsiflexion, 4/5 ankle plantaflexion'. Decreased [place] LUE with FTN and FTF. DTR's 1+   Results for orders placed or performed during the hospital encounter of 08/09/16 (from the past 48 hour(s))  Protime-INR     Status: None   Collection Time: 08/09/16 11:22 PM  Result Value Ref Range   Prothrombin Time 14.1 11.4 - 15.2 seconds   INR 1.08   APTT     Status: None   Collection Time: 08/09/16 11:22 PM  Result Value Ref Range   aPTT 28 24 - 36 seconds  CBC     Status: Abnormal   Collection Time: 08/09/16 11:22 PM  Result Value Ref Range   WBC 8.8 4.0 - 10.5 K/uL   RBC 4.58 4.22 - 5.81 MIL/uL   Hemoglobin 10.2 (L) 13.0 - 17.0 g/dL   HCT 35.0 (L) 39.0 - 52.0 %   MCV 76.4 (L) 78.0 - 100.0 fL   MCH 22.3 (L) 26.0 - 34.0 pg   MCHC 29.1 (L) 30.0 - 36.0 g/dL   RDW 16.5 (H) 11.5 - 15.5 %   Platelets 264 150 - 400 K/uL  Differential     Status: Abnormal   Collection Time: 08/09/16 11:22 PM  Result Value Ref Range   Neutrophils Relative % 43 %   Neutro Abs 3.8 1.7 - 7.7 K/uL   Lymphocytes Relative 37 %    Lymphs Abs 3.3 0.7 - 4.0 K/uL   Monocytes Relative 14 %   Monocytes Absolute 1.3 (H) 0.1 - 1.0 K/uL   Eosinophils Relative 5 %   Eosinophils Absolute 0.4 0.0 - 0.7 K/uL   Basophils Relative 1 %   Basophils Absolute 0.1 0.0 - 0.1 K/uL  Comprehensive metabolic panel     Status: Abnormal   Collection Time: 08/09/16 11:22 PM  Result Value Ref Range   Sodium 138 135 - 145 mmol/L   Potassium 3.5 3.5 - 5.1 mmol/L   Chloride 108 101 - 111 mmol/L   CO2 24 22 - 32 mmol/L   Glucose, Bld 104 (H) 65 - 99 mg/dL   BUN 14 6 - 20 mg/dL   Creatinine, Ser 1.10 0.61 - 1.24 mg/dL   Calcium 8.6 (L) 8.9 - 10.3 mg/dL   Total Protein 7.0 6.5 - 8.1 g/dL   Albumin 3.6 3.5 - 5.0 g/dL   AST 19 15 - 41 U/L   ALT 11 (L) 17 - 63 U/L   Alkaline Phosphatase 83 38 - 126 U/L   Total Bilirubin 0.5 0.3 - 1.2 mg/dL   GFR calc non Af Amer >60 >60 mL/min   GFR calc Af Amer >60 >60 mL/min    Comment: (NOTE) The eGFR has been calculated using the CKD EPI equation. This calculation has not been validated in all clinical situations. eGFR's persistently <60 mL/min signify possible Chronic Kidney Disease.    Anion gap 6 5 - 15  I-stat troponin, ED     Status: None   Collection Time: 08/09/16 11:26 PM  Result Value Ref Range   Troponin i, poc 0.01 0.00 - 0.08 ng/mL   Comment 3            Comment: Due to the release kinetics of cTnI, a negative result within the first hours of the onset of symptoms does not rule out myocardial infarction with certainty. If myocardial infarction is still suspected, repeat the test at appropriate intervals.   CBG monitoring, ED     Status: None   Collection Time: 08/09/16 11:26 PM  Result Value Ref Range   Glucose-Capillary 93 65 - 99 mg/dL  I-Stat Chem 8, ED     Status: Abnormal   Collection Time: 08/09/16 11:27 PM  Result Value Ref Range   Sodium 140 135 - 145 mmol/L   Potassium 3.5 3.5 - 5.1 mmol/L   Chloride 106 101 - 111 mmol/L   BUN 17 6 - 20 mg/dL   Creatinine, Ser  1.00 0.61 - 1.24 mg/dL   Glucose, Bld 101 (  H) 65 - 99 mg/dL   Calcium, Ion 0.92 (L) 1.15 - 1.40 mmol/L   TCO2 24 0 - 100 mmol/L   Hemoglobin 11.9 (L) 13.0 - 17.0 g/dL   HCT 35.0 (L) 39.0 - 52.0 %  Hemoglobin A1c     Status: Abnormal   Collection Time: 08/10/16  7:37 AM  Result Value Ref Range   Hgb A1c MFr Bld 5.8 (H) 4.8 - 5.6 %    Comment: (NOTE)         Pre-diabetes: 5.7 - 6.4         Diabetes: >6.4         Glycemic control for adults with diabetes: <7.0    Mean Plasma Glucose 120 mg/dL    Comment: (NOTE) Performed At: [facility] [place], [place] [id number] Lindon Romp MD TO:[id number]   Lipid panel     Status: None   Collection Time: 08/10/16  7:37 AM  Result Value Ref Range   Cholesterol 140 0 - 200 mg/dL   Triglycerides 109 <150 mg/dL   HDL 48 >40 mg/dL   Total CHOL/HDL Ratio 2.9 RATIO   VLDL 22 0 - 40 mg/dL   LDL Cholesterol 70 0 - 99 mg/dL    Comment:        Total Cholesterol/HDL:CHD Risk Coronary Heart Disease Risk Table                     Men   Women  1/2 Average Risk   3.4   3.3  Average Risk       5.0   4.4  2 X Average Risk   9.6   7.1  3 X Average Risk  23.4   11.0        Use the calculated Patient Ratio above and the CHD Risk Table to determine the patient's CHD Risk.        ATP III CLASSIFICATION (LDL):  <100     mg/dL   Optimal  100-129  mg/dL   Near or Above                    Optimal  130-159  mg/dL   Borderline  160-189  mg/dL   High  >190     mg/dL   Very High    Ct Angio Head W Or Wo Contrast  Addendum Date: 08/10/2016   ADDENDUM REPORT: 08/10/2016 03:59 ADDENDUM: There is a transcription error in the 1st impression point. This should read, "No emergent large vessel occlusion." Electronically Signed   By: Ulyses Jarred M.D.   On: 08/10/2016 03:59   Result Date: 08/10/2016 CLINICAL DATA:  Left-sided weakness EXAM: CT ANGIOGRAPHY HEAD AND NECK TECHNIQUE: Multidetector CT imaging of the head and neck was  performed using the standard protocol during bolus administration of intravenous contrast. Multiplanar CT image reconstructions and MIPs were obtained to evaluate the vascular anatomy. Carotid stenosis measurements (when applicable) are obtained utilizing NASCET criteria, using the distal internal carotid diameter as the denominator. CONTRAST:  50 mL Isovue 370 COMPARISON:  Head CT 08/09/2016 FINDINGS: CTA NECK FINDINGS Aortic arch: There is no aneurysm or dissection of the visualized ascending aorta or aortic arch. There is a normal 3 vessel branching pattern. The visualized proximal subclavian arteries are normal. Right carotid system: The right common carotid origin is widely patent. There is no common carotid or internal carotid artery dissection or aneurysm. No hemodynamically significant stenosis. Mild atherosclerotic calcification of the  proximal internal carotid artery. Or Left carotid system: The left common carotid origin is widely patent. There is no common carotid or internal carotid artery dissection or aneurysm. No hemodynamically significant stenosis. Vertebral arteries: The vertebral system is codominant. There is severe narrowing of the left vertebral artery origin due to atherosclerotic calcification. Both vertebral arteries are otherwise normal to their confluence with the basilar artery. Skeleton: There is no bony spinal canal stenosis. No lytic or blastic lesions. Other neck: The nasopharynx is clear. The oropharynx and hypopharynx are normal. The epiglottis is normal. The supraglottic larynx, glottis and subglottic larynx are normal. No retropharyngeal collection. The parapharyngeal spaces are preserved. The parotid and submandibular glands are normal. No sialolithiasis or salivary ductal dilatation. The thyroid gland is normal. There is no cervical lymphadenopathy. Upper chest: No pneumothorax or pleural effusion. No nodules or masses. Review of the MIP images confirms the above findings CTA  HEAD FINDINGS Anterior circulation: --Intracranial internal carotid arteries: Normal. --Anterior cerebral arteries: Normal. --Middle cerebral arteries: Normal. --Posterior communicating arteries: Diminutive bilaterally Posterior circulation: --Posterior cerebral arteries: Normal. --Superior cerebellar arteries: Normal. --Basilar artery: Normal. --Anterior inferior cerebellar arteries: Normal. --Posterior inferior cerebellar arteries: Normal. Venous sinuses: As permitted by contrast timing, patent. Anatomic variants: None Delayed phase: Not performed. Review of the MIP images confirms the above findings IMPRESSION: 1. Narrowing emergent intracranial large vessel occlusion. 2. 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 3. Otherwise, no hemodynamically significant stenosis of the major cervical arteries. Electronically Signed: By: Deatra Robinson M.D. On: 08/10/2016 00:04   Ct Angio Neck W And/or Wo Contrast  Addendum Date: 08/10/2016   ADDENDUM REPORT: 08/10/2016 03:59 ADDENDUM: There is a transcription error in the 1st impression point. This should read, "No emergent large vessel occlusion." Electronically Signed   By: Deatra Robinson M.D.   On: 08/10/2016 03:59   Result Date: 08/10/2016 CLINICAL DATA:  Left-sided weakness EXAM: CT ANGIOGRAPHY HEAD AND NECK TECHNIQUE: Multidetector CT imaging of the head and neck was performed using the standard protocol during bolus administration of intravenous contrast. Multiplanar CT image reconstructions and MIPs were obtained to evaluate the vascular anatomy. Carotid stenosis measurements (when applicable) are obtained utilizing NASCET criteria, using the distal internal carotid diameter as the denominator. CONTRAST:  50 mL Isovue 370 COMPARISON:  Head CT 08/09/2016 FINDINGS: CTA NECK FINDINGS Aortic arch: There is no aneurysm or dissection of the visualized ascending aorta or aortic arch. There is a normal 3 vessel branching  pattern. The visualized proximal subclavian arteries are normal. Right carotid system: The right common carotid origin is widely patent. There is no common carotid or internal carotid artery dissection or aneurysm. No hemodynamically significant stenosis. Mild atherosclerotic calcification of the proximal internal carotid artery. Or Left carotid system: The left common carotid origin is widely patent. There is no common carotid or internal carotid artery dissection or aneurysm. No hemodynamically significant stenosis. Vertebral arteries: The vertebral system is codominant. There is severe narrowing of the left vertebral artery origin due to atherosclerotic calcification. Both vertebral arteries are otherwise normal to their confluence with the basilar artery. Skeleton: There is no bony spinal canal stenosis. No lytic or blastic lesions. Other neck: The nasopharynx is clear. The oropharynx and hypopharynx are normal. The epiglottis is normal. The supraglottic larynx, glottis and subglottic larynx are normal. No retropharyngeal collection. The parapharyngeal spaces are preserved. The parotid and submandibular glands are normal. No sialolithiasis or salivary ductal dilatation. The thyroid gland is normal. There is  no cervical lymphadenopathy. Upper chest: No pneumothorax or pleural effusion. No nodules or masses. Review of the MIP images confirms the above findings CTA HEAD FINDINGS Anterior circulation: --Intracranial internal carotid arteries: Normal. --Anterior cerebral arteries: Normal. --Middle cerebral arteries: Normal. --Posterior communicating arteries: Diminutive bilaterally Posterior circulation: --Posterior cerebral arteries: Normal. --Superior cerebellar arteries: Normal. --Basilar artery: Normal. --Anterior inferior cerebellar arteries: Normal. --Posterior inferior cerebellar arteries: Normal. Venous sinuses: As permitted by contrast timing, patent. Anatomic variants: None Delayed phase: Not performed.  Review of the MIP images confirms the above findings IMPRESSION: 1. Narrowing emergent intracranial large vessel occlusion. 2. 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 3. Otherwise, no hemodynamically significant stenosis of the major cervical arteries. Electronically Signed: By: Deatra Robinson M.D. On: 08/10/2016 00:04   Mr Brain Wo Contrast  Result Date: 08/10/2016 CLINICAL DATA:  Acute left-sided weakness EXAM: MRI HEAD WITHOUT CONTRAST MRA HEAD WITHOUT CONTRAST TECHNIQUE: Multiplanar, multiecho pulse sequences of the brain and surrounding structures were obtained without intravenous contrast. Angiographic images of the head were obtained using MRA technique without contrast. COMPARISON:  CTA head and neck 08/09/2016 FINDINGS: MRI HEAD FINDINGS Brain: The midline structures are normal. There is focal diffusion restriction within the right caudate tail of and extending inferiorly to involve the posterior limb of the right internal capsule. There is no hemorrhage. No mass effect. There are additional punctate foci of diffusion restriction within the left parietal lobe and right occipital lobe. There is beginning confluent hyperintense T2-weighted signal within the periventricular white matter, most often seen in the setting of chronic microvascular ischemia. No mass lesion. No chronic microhemorrhage or cerebral amyloid angiopathy. No hydrocephalus, age advanced atrophy or lobar predominant volume loss. No dural abnormality or extra-axial collection. Skull and upper cervical spine: The visualized skull base, calvarium, upper cervical spine and extracranial soft tissues are normal. Sinuses/Orbits: No fluid levels or advanced mucosal thickening. No mastoid effusion. Normal orbits. MRA HEAD FINDINGS Intracranial internal carotid arteries: Normal. Anterior cerebral arteries: Normal. Middle cerebral arteries: Normal. Posterior communicating arteries: Present  bilaterally. Posterior cerebral arteries: Normal. Basilar artery: Normal. Vertebral arteries: Left dominant. Normal. Superior cerebellar arteries: Normal. Anterior inferior cerebellar arteries: Not clearly seen, which is not uncommon. Posterior inferior cerebellar arteries: Normal. IMPRESSION: 1. Acute ischemia of the right caudate tail extending to the posterior limb of the right internal capsule, in keeping with acute left-sided weakness. 2. Punctate foci of acute ischemia within the left parietal lobe and at the right temporal occipital junction. The distribution of lesions suggests a central cardioembolic process. 3. No hemorrhage or mass effect. 4. Chronic microvascular ischemia. 5. Normal intracranial MRA. Electronically Signed   By: Deatra Robinson M.D.   On: 08/10/2016 04:10   Mr Maxine Glenn Head/brain WX Cm  Result Date: 08/10/2016 CLINICAL DATA:  Acute left-sided weakness EXAM: MRI HEAD WITHOUT CONTRAST MRA HEAD WITHOUT CONTRAST TECHNIQUE: Multiplanar, multiecho pulse sequences of the brain and surrounding structures were obtained without intravenous contrast. Angiographic images of the head were obtained using MRA technique without contrast. COMPARISON:  CTA head and neck 08/09/2016 FINDINGS: MRI HEAD FINDINGS Brain: The midline structures are normal. There is focal diffusion restriction within the right caudate tail of and extending inferiorly to involve the posterior limb of the right internal capsule. There is no hemorrhage. No mass effect. There are additional punctate foci of diffusion restriction within the left parietal lobe and right occipital lobe. There is beginning confluent hyperintense T2-weighted signal within the periventricular white matter, most  often seen in the setting of chronic microvascular ischemia. No mass lesion. No chronic microhemorrhage or cerebral amyloid angiopathy. No hydrocephalus, age advanced atrophy or lobar predominant volume loss. No dural abnormality or extra-axial  collection. Skull and upper cervical spine: The visualized skull base, calvarium, upper cervical spine and extracranial soft tissues are normal. Sinuses/Orbits: No fluid levels or advanced mucosal thickening. No mastoid effusion. Normal orbits. MRA HEAD FINDINGS Intracranial internal carotid arteries: Normal. Anterior cerebral arteries: Normal. Middle cerebral arteries: Normal. Posterior communicating arteries: Present bilaterally. Posterior cerebral arteries: Normal. Basilar artery: Normal. Vertebral arteries: Left dominant. Normal. Superior cerebellar arteries: Normal. Anterior inferior cerebellar arteries: Not clearly seen, which is not uncommon. Posterior inferior cerebellar arteries: Normal. IMPRESSION: 1. Acute ischemia of the right caudate tail extending to the posterior limb of the right internal capsule, in keeping with acute left-sided weakness. 2. Punctate foci of acute ischemia within the left parietal lobe and at the right temporal occipital junction. The distribution of lesions suggests a central cardioembolic process. 3. No hemorrhage or mass effect. 4. Chronic microvascular ischemia. 5. Normal intracranial MRA. Electronically Signed   By: Ulyses Jarred M.D.   On: 08/10/2016 04:10   Ct Head Code Stroke W/o Cm  Addendum Date: 08/10/2016   ADDENDUM REPORT: 08/10/2016 00:13 ADDENDUM: These results were called by telephone at the time of interpretation on 08/10/2016 at 11:58 p.m. to Dr. Kerney Elbe, who verbally acknowledged these results. Electronically Signed   By: Ulyses Jarred M.D.   On: 08/10/2016 00:13   Result Date: 08/10/2016 CLINICAL DATA:  Code stroke.  Left-sided weakness EXAM: CT HEAD WITHOUT CONTRAST TECHNIQUE: Contiguous axial images were obtained from the base of the skull through the vertex without intravenous contrast. COMPARISON:  Head CT 05/14/2015 FINDINGS: Brain: No mass lesion, intraparenchymal hemorrhage or extra-axial collection. No evidence of acute cortical infarct. There  is periventricular hypoattenuation compatible with chronic microvascular disease. Vascular: No hyperdense vessel or unexpected calcification. Skull: Normal visualized skull base, calvarium and extracranial soft tissues. Sinuses/Orbits: No sinus fluid levels or advanced mucosal thickening. No mastoid effusion. Normal orbits. ASPECTS [facility] Stroke Program Early CT Score) - Ganglionic level infarction (caudate, lentiform nuclei, internal capsule, insula, M1-M3 cortex): 7 - Supraganglionic infarction (M4-M6 cortex): 3 Total score (0-10 with 10 being normal): 10 IMPRESSION: 1. 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 2. ASPECTS is 10. Electronically Signed: By: Ulyses Jarred M.D. On: 08/09/2016 23:47       Medical Problem List and Plan: 1.  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  DVT Prophylaxis/Anticoagulation: Subcutaneous Lovenox. Venous Doppler studies negative 3. Pain Management: Ultram as needed 4. Mood: Provide emotional support 5. Neuropsych: This patient is capable of making decisions on his own behalf. 6. Skin/Wound Care: Routine skin checks 7. Fluids/Electrolytes/Nutrition: Routine I&O's with follow-up chemistries 8. Rheumatoid arthritis.Arava 20 mg daily, chronic prednisone 5 mg daily 9. Hyperlipidemia. Lipitor 10. History of diverticulosis with recurrent GI bleed status post partial colectomy in the past.    Post Admission Physician Evaluation: 1. Functional deficits secondary  to Right caudate/PLIC infarct. 2. Patient is admitted to receive collaborative, interdisciplinary care between the physiatrist, rehab nursing staff, and therapy team. 3. Patient's level of medical complexity and substantial therapy needs in context of that medical necessity cannot be provided at a lesser intensity of care such as a SNF. 4. Patient has experienced  substantial functional loss from his/her baseline which  was documented above under the "Functional History" and "Functional Status" headings.  Judging by the patient's diagnosis, physical exam, and functional history, the patient has potential for functional progress which will result in measurable gains while on inpatient rehab.  These gains will be of substantial and practical use upon discharge  in facilitating mobility and self-care at the household level. 5. Physiatrist will provide 24 hour management of medical needs as well as oversight of the therapy plan/treatment and provide guidance as appropriate regarding the interaction of the two. 6. The Preadmission Screening has been reviewed and patient status is unchanged unless otherwise stated above. 7. 24 hour rehab nursing will assist with bladder management, bowel management, safety, skin/wound care, disease management, medication administration, pain management and patient education  and help integrate therapy concepts, techniques,education, etc. 8. PT will assess and treat for/with: Lower extremity strength, range of motion, stamina, balance, functional mobility, safety, adaptive techniques and equipment, NMR, family ed.   Goals are: mod I to supervision. 9. OT will assess and treat for/with: ADL's, functional mobility, safety, upper extremity strength, adaptive techniques and equipment, NMR, family ed.   Goals are: mod I to supervision. Therapy may proceed with showering this patient. 10. SLP will assess and treat for/with: n/a.  Goals are: n/a. 11. Case Management and Social Worker will assess and treat for psychological issues and discharge planning. 12. Team conference will be held weekly to assess progress toward goals and to determine barriers to discharge. 13. Patient will receive at least 3 hours of therapy per day at least 5 days per week. 14. ELOS: 10-14 days       15. Prognosis:  excellent     Meredith Staggers, MD, [facility] 08/11/2016  Cathlyn Parsons., PA-C 08/11/2016

## 2016-08-12 NOTE — Progress Notes (Signed)
Occupational Therapy Session Note  Patient Details  Name: [name] MRN: [id number] Date of Birth: Oct 16, 1940  Today's Date: 08/12/2016 OT Individual Time: 1600-1700 OT Individual Time Calculation (min): 60 min    Short Term Goals: Week 1:  OT Short Term Goal 1 (Week 1): STG=LTG d/t short LOS  Skilled Therapeutic Interventions/Progress Updates:    1:1. No pain reported. Pt spouse present throughout session. Focus functional mobility, tub transfers, endurance, standing balance, sit to stand and fine motor control. Pt ambulates to/from al therapeutic destinations with increased time and MIN A for balance with VC to increase stride length, clear L foot from floor, and RW management. Pt transfers onto TTB at ambulatory level with MIN A and VC for safety awareness. Pt completes ambulatory bed transfer onto ADL apartment bed with MIN A for balance and Vc to push up from bed surface when standing. Pt stands in therapy gym playing Wii bowling without assistive device with CGA-supervision for standing ~20 min without seated rest. Pt stands to pitch horse shoes with supervision and MOD A /VC for safety awareness as pt retrieves horse shoes from floor. Pt completes clothing fastener buttons, zipper, velcro, and lacing with increased time to improve FMC ov LUE. Exited session with pt seated in standard chair with call light in reach and all needs met.   Therapy Documentation Precautions:  Precautions Precautions: Fall Precaution Comments: Mild L hemiparesis, LLE fatigues with ambulation Restrictions Weight Bearing Restrictions: No General:     ADL: ADL ADL Comments: see Functional Assessment Tool  See Function Navigator for Current Functional Status.   Therapy/Group: Individual Therapy  Tonny Branch 08/12/2016, 5:55 PM

## 2016-08-12 NOTE — Interval H&P Note (Signed)
[name] was admitted today to Inpatient Rehabilitation with the diagnosis of Left MCA infarct.  The patient's history has been reviewed, patient examined, and there is no change in status.  Patient continues to be appropriate for intensive inpatient rehabilitation.  I have reviewed the patient's chart and labs.  Questions were answered to the patient's satisfaction. The PAPE has been reviewed and assessment remains appropriate.  Charlett Blake 08/12/2016, 11:52 AM

## 2016-08-12 NOTE — Progress Notes (Signed)
Noted recommendation for Eliquis for h/o atrial fibrillation and cryptogenic stroke per cards and neurology but not yet started. Spoke with Dr. Leonel Ramsay who recommended to d/c lovenox and ASA and start Eliquis per Dr. Phoebe Sharps note from 08/11/16. Eliquis 5 mg BID appropriate based on age <80, wt >60 kg and SCr < 1.5.   Ruta Hinds. Velva Harman, PharmD, BCPS, CPP Clinical Pharmacist Pager: [phone] Phone: [phone] 08/12/2016 9:23 AM

## 2016-08-12 NOTE — Evaluation (Signed)
Physical Therapy Assessment and Plan  Patient Details  Name: [name] MRN: [id number] Date of Birth: 02/09/1941  PT Diagnosis: Abnormality of gait, Coordination disorder and Hemiparesis non-dominant Rehab Potential: Good ELOS: 12-15 days   Today's Date: 08/12/2016 PT Individual Time: 0900-1010 PT Individual Time Calculation (min): 70 min    Problem List:  Patient Active Problem List   Diagnosis Date Noted  . 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 Acute ischemic stroke ([place]) 08/10/2016  . Hypertensive urgency 08/10/2016  . 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 Acute left-sided weakness   . Left-sided weakness   . History of lower GI bleeding   . Hiatal hernia   . 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 Acute post-hemorrhagic anemia 02/03/2013  . Rheumatoid arthritis ([place]) 04/14/2011  . Long-term current use of steroids 04/14/2011  . Iron deficiency anemia 04/14/2011    Past Medical History:  Past Medical History:  Diagnosis Date  . Blood transfusion    " no reaction from transfusion"  . BPH (benign prostatic hyperplasia)   . Bruises easily   . Bursitis    chronic hip pain  . Cancer [facility])    prostate cancer cells  . Disc disease, degenerative, lumbar or lumbosacral    Chronic back pain now  . Diverticulitis   . Esophageal ring    requiring dilations   . GERD (gastroesophageal reflux disease)   . History of GI bleed    "vessels burst in colon" x2  . History of kidney stones    chronic  . History of vertigo   . Osteoarthritis   . Osteoporosis   . Pneumonia    yrs ago  . Rash    GROIN - ITCHING PAST 3 MONTHS - it is gone now 05/02/16  . Rheumatoid arthritis(714.0)    27 years  . Spondylisthesis   . Ureteral stone 11/2012   Past Surgical History:  Past Surgical History:  Procedure Laterality Date  . BALLOON  DILATION N/A 10/01/2012   Procedure: BALLOON DILATION;  Surgeon: Garlan Fair, MD;  Location: [facility];  Service: Endoscopy;  Laterality: N/A;  . COLECTOMY N/A 06/10/2013   Procedure: TOTAL abdominal COLECTOMY;  Surgeon: Odis Hollingshead, MD;  Location: [facility];  Service: General;  Laterality: N/A;  . COLONOSCOPY    . CYSTOSCOPY WITH URETEROSCOPY Right 12/05/2012   Procedure: CYSTOSCOPY WITH RIGHT URETEROSCOPY AND STONE EXTRACTION;  Surgeon: Malka So, MD;  Location: [facility];  Service: Urology;  Laterality: Right;  . ESOPHAGOGASTRODUODENOSCOPY N/A 10/01/2012   Procedure: ESOPHAGOGASTRODUODENOSCOPY (EGD);  Surgeon: Garlan Fair, MD;  Location: [facility];  Service: Endoscopy;  Laterality: N/A;  . HERNIA REPAIR    . INGUINAL HERNIA REPAIR  2007   Bilateral   . Lithotripsy for nephrolithiasis     Pt and wife not sure when but here in [place]  . Lysis of Adhesion, small bowel resection  2009  . MAXIMUM ACCESS (MAS)POSTERIOR LUMBAR INTERBODY FUSION (PLIF) 1 LEVEL N/A 05/08/2016   Procedure: LUMBAR FOUR-FIVE FOR MAXIMUM ACCESS (MAS) POSTERIOR LUMBAR INTERBODY FUSION;  Surgeon: Kary Kos, MD;  Location: [place];  Service: Neurosurgery;  Laterality: N/A;  . NISSEN FUNDOPLICATION     about [id number], pt not sure records not currently available  . TOTAL KNEE ARTHROPLASTY  2003   Bilateral  . TRANSURETHRAL PROSTATECTOMY WITH GYRUS INSTRUMENTS N/A 12/05/2012   Procedure:  TRANSURETHRAL PROSTATECTOMY WITH GYRUS INSTRUMENTS;  Surgeon: Malka So, MD;  Location: [facility];  Service: Urology;  Laterality: N/A;  . Ventral Hernia Repair with Mesh      Assessment & Plan Clinical Impression: [name] is a 76 y.o. 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 Per chart review patient lives with spouse and was independent prior to  admission. Two-level home with bedroom upstairs. Presented 08/10/2016 with left-sided weakness of acute onset resulting in a fall with no loss of consciousness. Blood pressure mildly elevated 170/100. CT/MRI showed acute ischemia of the right caudate tail extending to the posterior limb of the right internal capsule. Punctate foci of acute ischemia within the left parietal lobe and at the right temporal occipital junction. MRA was unremarkable. Patient did not receive TPA. CT angiogram head and neck showed narrowing emergent intracranial large vessel occlusion. 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 Echocardiogram with EF 07% grade 1 diastolic dysfunction. No current plan for TEE secondary significant GI history. Venous Dopplers lower extremities negative for DVT. Neurology consulted currently on aspirin for CVA prophylaxis. Subcutaneous Lovenox for DVT prophylaxis. Physical and occupational therapy evaluations completed with recommendations for physical medicine rehabilitation consult. Patient was admitted for a comprehensive rehabilitation program.  Patient transferred to CIR on 08/11/2016 .   Patient currently requires mod with mobility secondary to muscle weakness, unbalanced muscle activation and decreased coordination and decreased standing balance, decreased postural control, hemiplegia and decreased balance strategies.  Prior to hospitalization, patient was independent  with mobility and lived with Spouse in a House home.  Home access is  Level entry.  Patient will benefit from skilled PT intervention to maximize safe functional mobility, minimize fall risk and decrease caregiver burden for planned discharge home with 24 hour supervision.  Anticipate patient will benefit from follow up OP at discharge.  PT - End of Session Activity Tolerance: Tolerates 30+ min activity with multiple rests Endurance Deficit: Yes PT Assessment Rehab Potential  (ACUTE/IP ONLY): Good Barriers to Discharge: Inaccessible home environment PT Patient demonstrates impairments in the following area(s): Balance;Safety;Endurance;Motor PT Transfers Functional Problem(s): Bed Mobility;Bed to Chair;Car;Furniture PT Locomotion Functional Problem(s): Stairs;Ambulation PT Plan PT Intensity: Minimum of 1-2 x/day ,45 to 90 minutes PT Frequency: 5 out of 7 days PT Duration Estimated Length of Stay: 12-15 days PT Treatment/Interventions: Ambulation/gait training;Community reintegration;DME/adaptive equipment instruction;Neuromuscular re-education;Psychosocial support;Stair training;UE/LE Strength taining/ROM;UE/LE Coordination activities;Therapeutic Activities;Discharge planning;Balance/vestibular training;Disease management/prevention;Functional mobility training;Patient/family education;Splinting/orthotics;Therapeutic Exercise PT Transfers Anticipated Outcome(s): mod I PT Locomotion Anticipated Outcome(s): mod I household, supervision community PT Recommendation Recommendations for Other Services: Therapeutic Recreation consult Therapeutic Recreation Interventions: Outing/community reintergration Follow Up Recommendations: Outpatient PT Patient destination: Home Equipment Recommended: To be determined  Skilled Therapeutic Intervention No c/o pain.  PT provided education to pt and wife regarding role of PT, ELOS, PT goals, and plan of care.  PT instructed pt in transfers with and without AD with min/mod assist, mod cues for hand placement with introduction of RW.  Gait training without device with mod assist x100' + 150' with RW and initial min assist progress to mod assist with fatigue due to decreasing LLE clearance and forward lean.  PT administered BERG balance scale and interpreted results for pt/wife and made recommendations for use of RW at present and assist from staff for all mobility.  Pt returned to room at  end of session and positioned with call bell in reach  and needs met.   PT Evaluation Precautions/Restrictions Precautions Precautions: Fall Precaution Comments: Mild L hemiparesis, LLE fatigues with ambulation Restrictions Weight Bearing Restrictions: No General   Vital Signs Pain Pain Assessment Pain Assessment: No/denies pain Pain Score: 0-No pain Pain Type: Chronic pain Pain Location: Generalized Pain Descriptors / Indicators: Dull Pain Onset: On-going Patients Stated Pain Goal: 0 Pain Intervention(s): Medication (See eMAR) Home Living/Prior Functioning Home Living Available Help at Discharge: Family;Available 24 hours/day Type of Home: House Home Access: Level entry Home Layout: Two level;Bed/bath upstairs Alternate Level Stairs-Number of Steps: 1 flight Alternate Level Stairs-Rails: Left  Lives With: Spouse Prior Function Level of Independence: Independent with gait;Independent with transfers  Able to Take Stairs?: Yes Driving: Yes Vocation: Retired Radiographer, therapeutic - History Baseline Vision: Wears glasses all the time Patient Visual Report: No change from baseline Perception Perception: Within Functional Limits Praxis Praxis: Intact  Cognition Arousal/Alertness: Awake/alert Orientation Level: Oriented X4 Safety/Judgment: Appears intact Sensation Sensation Light Touch: Appears Intact (LEs) Coordination Gross Motor Movements are Fluid and Coordinated: No Fine Motor Movements are Fluid and Coordinated: No Coordination and Movement Description: increased ataxia with fatigue  Finger Nose Finger Test: mild decrease in speed on L Heel Shin Test: R=L normal Motor  Motor Motor: Hemiplegia;Abnormal postural alignment and control  Mobility Bed Mobility Bed Mobility: Sit to Supine;Supine to Sit Supine to Sit: 5: Supervision Sit to Supine: 5: Supervision Transfers Transfers: Yes Sit to Stand: 3: Mod assist Stand to Sit: 3: Mod assist Stand Pivot Transfers: 3: Mod assist Locomotion   Ambulation Ambulation: Yes Ambulation/Gait Assistance: 3: Mod assist Ambulation Distance (Feet): 100 Feet Assistive device: None Ambulation/Gait Assistance Details: progressive L lateral lean and decreased L foot clearance with fatigue Gait Gait: Yes Gait Pattern: Impaired Gait Pattern: Step-through pattern;Decreased weight shift to left;Decreased step length - right;Poor foot clearance - left Gait velocity: decreased Stairs / Additional Locomotion Stairs: Yes Stairs Assistance: 4: Min assist Stair Management Technique: Two rails Number of Stairs: 4 Height of Stairs: 6 Wheelchair Mobility Wheelchair Mobility: No  Trunk/Postural Assessment  Cervical Assessment Cervical Assessment: Within Functional Limits Thoracic Assessment Thoracic Assessment: Within Functional Limits Lumbar Assessment Lumbar Assessment: Within Functional Limits Postural Control Postural Control: Deficits on evaluation Protective Responses: intact in sitting, but delayed and insufficient in standing Postural Limitations: decreased in standing  Balance Standardized Balance Assessment Standardized Balance Assessment: Berg Balance Test Berg Balance Test Sit to Stand: Able to stand  independently using hands Standing Unsupported: Able to stand 2 minutes with supervision Sitting with Back Unsupported but Feet Supported on Floor or Stool: Able to sit safely and securely 2 minutes Stand to Sit: Sits safely with minimal use of hands Transfers: Able to transfer safely, definite need of hands Standing Unsupported with Eyes Closed: Able to stand 3 seconds Standing Ubsupported with Feet Together: Needs help to attain position but able to stand for 30 seconds with feet together From Standing, Reach Forward with Outstretched Arm: Can reach forward >5 cm safely (2") From Standing Position, Pick up Object from Floor: Able to pick up shoe, needs supervision From Standing Position, Turn to Look Behind Over each Shoulder:  Looks behind one side only/other side shows less weight shift Turn 360 Degrees: Needs close supervision or verbal cueing Standing Unsupported, Alternately Place Feet on Step/Stool: Needs assistance to keep from falling or unable to try Standing Unsupported, One Foot in Front: Loses balance while stepping or standing Standing on  One Leg: Unable to try or needs assist to prevent fall Total Score: 29 Extremity Assessment      RLE Assessment RLE Assessment: Exceptions to [facility] RLE Strength Right Hip Flexion: 4+/5 Right Hip ABduction: 4/5 Right Hip ADduction: 4/5 Right Knee Flexion: 4+/5 Right Knee Extension: 4+/5 Right Ankle Dorsiflexion: 4+/5 Right Ankle Plantar Flexion: 4+/5 LLE Assessment LLE Assessment: Exceptions to [facility] LLE Strength Left Hip Flexion: 3+/5 Left Hip ABduction: 4-/5 Left Hip ADduction: 4-/5 Left Knee Flexion: 4+/5 Left Knee Extension: 4+/5 Left Ankle Dorsiflexion: 4+/5 Left Ankle Plantar Flexion: 4+/5   See Function Navigator for Current Functional Status.   Refer to Care Plan for Long Term Goals  Recommendations for other services: Therapeutic Recreation  Outing/community reintegration  Discharge Criteria: Patient will be discharged from PT if patient refuses treatment 3 consecutive times without medical reason, if treatment goals not met, if there is a change in medical status, if patient makes no progress towards goals or if patient is discharged from hospital.  The above assessment, treatment plan, treatment alternatives and goals were discussed and mutually agreed upon: by patient and by family  Urban Gibson E Penven-Crew 08/12/2016, 10:18 AM

## 2016-08-12 NOTE — Progress Notes (Signed)
[name] Blake, MD Physician Signed Physical Medicine and Rehabilitation  Consult Note Date of Service: 08/10/2016 11:28 AM  Related encounter: ED to Hosp-Admission (Discharged) from 08/09/2016 in [place] All Collapse All   [] Hide copied text [] Hover for attribution information      Physical Medicine and Rehabilitation Consult Reason for Consult: Left-sided weakness Referring Physician: [facility]   HPI: [name] is a 76 y.o. 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 Per chart review patient lives with spouse independent prior to admission . Two-level home with bedroom upstairs. Presented 08/10/2016 with left-sided weakness of acute onset resulting in a fall with no loss of consciousness. Blood pressure mildly elevated 170/100. CT/MRI showed acute ischemia of the right caudate tail extending to the posterior limb of the right internal capsule. Punctate foci of acute ischemia within the left parietal lobe and at the right temporal occipital junction. MRA was unremarkable. Patient did not receive TPA. CT angiogram head and neck showed narrowing emergent intracranial large vessel occlusion. 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 Echocardiogram is pending. Venous Dopplers lower extremities negative for DVT. Neurology consulted currently on aspirin for CVA prophylaxis. Subcutaneous Lovenox for DVT prophylaxis. Physical therapy evaluation completed with recommendations of physical medicine rehabilitation consult.   Review of Systems  Constitutional: Negative for chills and fever.  HENT: Negative for hearing loss.   Eyes: Negative for blurred vision and double vision.  Respiratory: Negative for shortness of  breath.   Cardiovascular: Negative for chest pain, palpitations and leg swelling.  Gastrointestinal: Positive for constipation. Negative for nausea.       GERD  Genitourinary: Positive for urgency. Negative for dysuria, flank pain and hematuria.  Musculoskeletal: Positive for back pain.  Skin: Negative for rash.  Neurological: Positive for weakness. Negative for seizures.       Vertigo  All other systems reviewed and are negative.      Past Medical History:  Diagnosis Date  . Blood transfusion    " no reaction from transfusion"  . BPH (benign prostatic hyperplasia)   . Bruises easily   . Bursitis    chronic hip pain  . Cancer [facility])    prostate cancer cells  . Disc disease, degenerative, lumbar or lumbosacral    Chronic back pain now  . Diverticulitis   . Esophageal ring    requiring dilations   . GERD (gastroesophageal reflux disease)   . History of GI bleed    "vessels burst in colon" x2  . History of kidney stones    chronic  . History of vertigo   . Osteoarthritis   . Osteoporosis   . Pneumonia    yrs ago  . Rash    GROIN - ITCHING PAST 3 MONTHS - it is gone now 05/02/16  . Rheumatoid arthritis(714.0)    27 years  . Spondylisthesis   . Ureteral stone 11/2012        Past Surgical History:  Procedure Laterality Date  . BALLOON DILATION N/A 10/01/2012   Procedure: BALLOON DILATION;  Surgeon: Garlan Fair, MD;  Location: [facility];  Service: Endoscopy;  Laterality: N/A;  . COLECTOMY N/A 06/10/2013   Procedure: TOTAL abdominal COLECTOMY;  Surgeon: Odis Hollingshead, MD;  Location: [facility];  Service: General;  Laterality: N/A;  .  COLONOSCOPY    . CYSTOSCOPY WITH URETEROSCOPY Right 12/05/2012   Procedure: CYSTOSCOPY WITH RIGHT URETEROSCOPY AND STONE EXTRACTION;  Surgeon: Malka So, MD;  Location: [facility];  Service: Urology;  Laterality: Right;  . ESOPHAGOGASTRODUODENOSCOPY N/A 10/01/2012   Procedure: ESOPHAGOGASTRODUODENOSCOPY  (EGD);  Surgeon: Garlan Fair, MD;  Location: [facility];  Service: Endoscopy;  Laterality: N/A;  . HERNIA REPAIR    . INGUINAL HERNIA REPAIR  2007   Bilateral   . Lithotripsy for nephrolithiasis     Pt and wife not sure when but here in [place]  . Lysis of Adhesion, small bowel resection  2009  . MAXIMUM ACCESS (MAS)POSTERIOR LUMBAR INTERBODY FUSION (PLIF) 1 LEVEL N/A 05/08/2016   Procedure: LUMBAR FOUR-FIVE FOR MAXIMUM ACCESS (MAS) POSTERIOR LUMBAR INTERBODY FUSION;  Surgeon: Kary Kos, MD;  Location: [facility];  Service: Neurosurgery;  Laterality: N/A;  . NISSEN FUNDOPLICATION     about [id number], pt not sure records not currently available  . TOTAL KNEE ARTHROPLASTY  2003   Bilateral  . TRANSURETHRAL PROSTATECTOMY WITH GYRUS INSTRUMENTS N/A 12/05/2012   Procedure: TRANSURETHRAL PROSTATECTOMY WITH GYRUS INSTRUMENTS;  Surgeon: Malka So, MD;  Location: [facility];  Service: Urology;  Laterality: N/A;  . Ventral Hernia Repair with Mesh          Family History  Problem Relation Age of Onset  . Adopted: Yes  . Heart failure Mother   . Heart failure Father    Social History:  reports that he has never smoked. He has never used smokeless tobacco. He reports that he does not drink alcohol or use drugs. Allergies:       Allergies  Allergen Reactions  . Clindamycin/Lincomycin Hives, Shortness Of Breath and Swelling  . Enbrel [Etanercept] Swelling    SEVERE LEG SWELLING   . Penicillins Hives and Shortness Of Breath    Has patient had a PCN reaction causing immediate rash, facial/tongue/throat swelling, SOB or lightheadedness with hypotension: Yes Has patient had a PCN reaction causing severe rash involving mucus membranes or skin necrosis: Yes Has patient had a PCN reaction that required hospitalization No Has patient had a PCN reaction occurring within the last 10 years: No If all of the above answers are "NO", then may proceed with Cephalosporin use.   .  Codeine Hives and Swelling         Medications Prior to Admission  Medication Sig Dispense Refill  . acetaminophen (TYLENOL) 650 MG CR tablet Take 650-1,300 mg by mouth 2 (two) times daily. 1300 mg in the morning & 650 mg at night    . diphenhydramine-acetaminophen (TYLENOL PM) 25-500 MG TABS tablet Take 1 tablet by mouth at bedtime.    Marland Kitchen leflunomide (ARAVA) 20 MG tablet Take 20 mg by mouth daily.     Marland Kitchen omeprazole (PRILOSEC) 20 MG capsule Take 20 mg by mouth daily.      . predniSONE (DELTASONE) 5 MG tablet Take 5 mg by mouth daily.    Marland Kitchen tetrahydrozoline-zinc (VISINE-AC) 0.05-0.25 % ophthalmic solution Place 2 drops into both eyes 3 (three) times daily as needed (for allergies).    . traMADol (ULTRAM) 50 MG tablet Take 50 mg by mouth 2 (two) times daily.      Home: Home Living Family/patient expects to be discharged to:: Private residence Living Arrangements: Spouse/significant other Available Help at Discharge: Family, Available 24 hours/day Type of Home: House Home Access: Level entry Home Layout: Two level, Bed/bath upstairs Alternate Level Stairs-Number of Steps: 1 flight  Alternate Level Stairs-Rails: Left Bathroom Shower/Tub:  (jacuzzitub) Bathroom Toilet: Handicapped height Bathroom Accessibility: Yes Home Equipment: Cane - single point  Functional History: Prior Function Level of Independence: Independent Comments: Drives, cleans. Active. Functional Status:  Mobility: Bed Mobility General bed mobility comments: pt sitting OOB in recliner chair when therapist entered room.  Transfers Overall transfer level: Needs assistance Equipment used: 1 person hand held assist Transfers: Sit to/from Stand Sit to Stand: Max assist General transfer comment: increased time, max A to rise into standing and to maintain standing as pt with heavy L sided lean. pt performed sit-to-stand x2 from recliner  ADL:  Cognition: Cognition Overall Cognitive Status:  Impaired/Different from baseline Orientation Level: Oriented X4 Cognition Arousal/Alertness: Awake/alert Behavior During Therapy: WFL for tasks assessed/performed Overall Cognitive Status: Impaired/Different from baseline Area of Impairment: Safety/judgement Safety/Judgement: Decreased awareness of safety, Decreased awareness of deficits  Blood pressure (!) 190/83, pulse 66, temperature 98.1 F (36.7 C), temperature source Oral, resp. rate 19, height 5\' 7"  (1.702 m), weight 80.6 kg (177 lb 11.2 oz), SpO2 100 %. Physical Exam  Vitals reviewed. Constitutional: He is oriented to person, place, and time. He appears well-developed.  HENT:  Mild facial droop  Eyes: EOM are normal. Left eye exhibits no discharge.  Neck: Normal range of motion. Neck supple. No thyromegaly present.  Cardiovascular: Normal rate and regular rhythm.   Respiratory: Effort normal and breath sounds normal. No respiratory distress.  GI: Soft. Bowel sounds are normal. He exhibits no distension.  Neurological: He is alert and oriented to person, place, and time.  Follows commands. Fair awareness of deficits  Skin: Skin is warm and dry.  5/5 right deltoid, biceps, triceps, grip, hip flexor, knee extensor, ankle dorsiflexor and plantar flexor 4/5 left deltoid, bicep, triceps, grip, hip flexor, knee extensor, ankle plantar flexor and dorsiflexor. Sensation intact to light touch in bilateral upper and lower limbs. Visual fields are intact to confrontation testing. Bilateral There is no evidence of dysmetria finger-nose finger on the right side. There is mild dysmetria, left finger-nose to finger, as well as moderate dysmetria on left heel-to-shin. There is decreased fine motor. Left finger. Thumb opposition. Positive dysdiadochokinesis. Rapid alternating movements. Left upper extremity. Sit to stand is with mod assist  Lab Results Last 24 Hours       Results for orders placed or performed during the hospital  encounter of 08/09/16 (from the past 24 hour(s))  Protime-INR     Status: None   Collection Time: 08/09/16 11:22 PM  Result Value Ref Range   Prothrombin Time 14.1 11.4 - 15.2 seconds   INR 1.08   APTT     Status: None   Collection Time: 08/09/16 11:22 PM  Result Value Ref Range   aPTT 28 24 - 36 seconds  CBC     Status: Abnormal   Collection Time: 08/09/16 11:22 PM  Result Value Ref Range   WBC 8.8 4.0 - 10.5 K/uL   RBC 4.58 4.22 - 5.81 MIL/uL   Hemoglobin 10.2 (L) 13.0 - 17.0 g/dL   HCT 35.0 (L) 39.0 - 52.0 %   MCV 76.4 (L) 78.0 - 100.0 fL   MCH 22.3 (L) 26.0 - 34.0 pg   MCHC 29.1 (L) 30.0 - 36.0 g/dL   RDW 16.5 (H) 11.5 - 15.5 %   Platelets 264 150 - 400 K/uL  Differential     Status: Abnormal   Collection Time: 08/09/16 11:22 PM  Result Value Ref Range   Neutrophils Relative %  43 %   Neutro Abs 3.8 1.7 - 7.7 K/uL   Lymphocytes Relative 37 %   Lymphs Abs 3.3 0.7 - 4.0 K/uL   Monocytes Relative 14 %   Monocytes Absolute 1.3 (H) 0.1 - 1.0 K/uL   Eosinophils Relative 5 %   Eosinophils Absolute 0.4 0.0 - 0.7 K/uL   Basophils Relative 1 %   Basophils Absolute 0.1 0.0 - 0.1 K/uL  Comprehensive metabolic panel     Status: Abnormal   Collection Time: 08/09/16 11:22 PM  Result Value Ref Range   Sodium 138 135 - 145 mmol/L   Potassium 3.5 3.5 - 5.1 mmol/L   Chloride 108 101 - 111 mmol/L   CO2 24 22 - 32 mmol/L   Glucose, Bld 104 (H) 65 - 99 mg/dL   BUN 14 6 - 20 mg/dL   Creatinine, Ser 1.10 0.61 - 1.24 mg/dL   Calcium 8.6 (L) 8.9 - 10.3 mg/dL   Total Protein 7.0 6.5 - 8.1 g/dL   Albumin 3.6 3.5 - 5.0 g/dL   AST 19 15 - 41 U/L   ALT 11 (L) 17 - 63 U/L   Alkaline Phosphatase 83 38 - 126 U/L   Total Bilirubin 0.5 0.3 - 1.2 mg/dL   GFR calc non Af Amer >60 >60 mL/min   GFR calc Af Amer >60 >60 mL/min   Anion gap 6 5 - 15  I-stat troponin, ED     Status: None   Collection Time: 08/09/16 11:26 PM  Result Value Ref Range    Troponin i, poc 0.01 0.00 - 0.08 ng/mL   Comment 3          CBG monitoring, ED     Status: None   Collection Time: 08/09/16 11:26 PM  Result Value Ref Range   Glucose-Capillary 93 65 - 99 mg/dL  I-Stat Chem 8, ED     Status: Abnormal   Collection Time: 08/09/16 11:27 PM  Result Value Ref Range   Sodium 140 135 - 145 mmol/L   Potassium 3.5 3.5 - 5.1 mmol/L   Chloride 106 101 - 111 mmol/L   BUN 17 6 - 20 mg/dL   Creatinine, Ser 1.00 0.61 - 1.24 mg/dL   Glucose, Bld 101 (H) 65 - 99 mg/dL   Calcium, Ion 0.92 (L) 1.15 - 1.40 mmol/L   TCO2 24 0 - 100 mmol/L   Hemoglobin 11.9 (L) 13.0 - 17.0 g/dL   HCT 35.0 (L) 39.0 - 52.0 %  Lipid panel     Status: None   Collection Time: 08/10/16  7:37 AM  Result Value Ref Range   Cholesterol 140 0 - 200 mg/dL   Triglycerides 109 <150 mg/dL   HDL 48 >40 mg/dL   Total CHOL/HDL Ratio 2.9 RATIO   VLDL 22 0 - 40 mg/dL   LDL Cholesterol 70 0 - 99 mg/dL      Imaging Results (Last 48 hours)  Ct Angio Head W Or Wo Contrast  Addendum Date: 08/10/2016   ADDENDUM REPORT: 08/10/2016 03:59 ADDENDUM: There is a transcription error in the 1st impression point. This should read, "No emergent large vessel occlusion." Electronically Signed   By: Ulyses Jarred M.D.   On: 08/10/2016 03:59   Result Date: 08/10/2016 CLINICAL DATA:  Left-sided weakness EXAM: CT ANGIOGRAPHY HEAD AND NECK TECHNIQUE: Multidetector CT imaging of the head and neck was performed using the standard protocol during bolus administration of intravenous contrast. Multiplanar CT image reconstructions and MIPs were obtained to evaluate  the vascular anatomy. Carotid stenosis measurements (when applicable) are obtained utilizing NASCET criteria, using the distal internal carotid diameter as the denominator. CONTRAST:  50 mL Isovue 370 COMPARISON:  Head CT 08/09/2016 FINDINGS: CTA NECK FINDINGS Aortic arch: There is no aneurysm or dissection of the visualized ascending aorta or  aortic arch. There is a normal 3 vessel branching pattern. The visualized proximal subclavian arteries are normal. Right carotid system: The right common carotid origin is widely patent. There is no common carotid or internal carotid artery dissection or aneurysm. No hemodynamically significant stenosis. Mild atherosclerotic calcification of the proximal internal carotid artery. Or Left carotid system: The left common carotid origin is widely patent. There is no common carotid or internal carotid artery dissection or aneurysm. No hemodynamically significant stenosis. Vertebral arteries: The vertebral system is codominant. There is severe narrowing of the left vertebral artery origin due to atherosclerotic calcification. Both vertebral arteries are otherwise normal to their confluence with the basilar artery. Skeleton: There is no bony spinal canal stenosis. No lytic or blastic lesions. Other neck: The nasopharynx is clear. The oropharynx and hypopharynx are normal. The epiglottis is normal. The supraglottic larynx, glottis and subglottic larynx are normal. No retropharyngeal collection. The parapharyngeal spaces are preserved. The parotid and submandibular glands are normal. No sialolithiasis or salivary ductal dilatation. The thyroid gland is normal. There is no cervical lymphadenopathy. Upper chest: No pneumothorax or pleural effusion. No nodules or masses. Review of the MIP images confirms the above findings CTA HEAD FINDINGS Anterior circulation: --Intracranial internal carotid arteries: Normal. --Anterior cerebral arteries: Normal. --Middle cerebral arteries: Normal. --Posterior communicating arteries: Diminutive bilaterally Posterior circulation: --Posterior cerebral arteries: Normal. --Superior cerebellar arteries: Normal. --Basilar artery: Normal. --Anterior inferior cerebellar arteries: Normal. --Posterior inferior cerebellar arteries: Normal. Venous sinuses: As permitted by contrast timing, patent.  Anatomic variants: None Delayed phase: Not performed. Review of the MIP images confirms the above findings IMPRESSION: 1. Narrowing emergent intracranial large vessel occlusion. 2. 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 3. Otherwise, no hemodynamically significant stenosis of the major cervical arteries. Electronically Signed: By: Ulyses Jarred M.D. On: 08/10/2016 00:04   Ct Angio Neck W And/or Wo Contrast  Addendum Date: 08/10/2016   ADDENDUM REPORT: 08/10/2016 03:59 ADDENDUM: There is a transcription error in the 1st impression point. This should read, "No emergent large vessel occlusion." Electronically Signed   By: Ulyses Jarred M.D.   On: 08/10/2016 03:59   Result Date: 08/10/2016 CLINICAL DATA:  Left-sided weakness EXAM: CT ANGIOGRAPHY HEAD AND NECK TECHNIQUE: Multidetector CT imaging of the head and neck was performed using the standard protocol during bolus administration of intravenous contrast. Multiplanar CT image reconstructions and MIPs were obtained to evaluate the vascular anatomy. Carotid stenosis measurements (when applicable) are obtained utilizing NASCET criteria, using the distal internal carotid diameter as the denominator. CONTRAST:  50 mL Isovue 370 COMPARISON:  Head CT 08/09/2016 FINDINGS: CTA NECK FINDINGS Aortic arch: There is no aneurysm or dissection of the visualized ascending aorta or aortic arch. There is a normal 3 vessel branching pattern. The visualized proximal subclavian arteries are normal. Right carotid system: The right common carotid origin is widely patent. There is no common carotid or internal carotid artery dissection or aneurysm. No hemodynamically significant stenosis. Mild atherosclerotic calcification of the proximal internal carotid artery. Or Left carotid system: The left common carotid origin is widely patent. There is no common carotid or internal carotid artery dissection or aneurysm. No hemodynamically  significant  stenosis. Vertebral arteries: The vertebral system is codominant. There is severe narrowing of the left vertebral artery origin due to atherosclerotic calcification. Both vertebral arteries are otherwise normal to their confluence with the basilar artery. Skeleton: There is no bony spinal canal stenosis. No lytic or blastic lesions. Other neck: The nasopharynx is clear. The oropharynx and hypopharynx are normal. The epiglottis is normal. The supraglottic larynx, glottis and subglottic larynx are normal. No retropharyngeal collection. The parapharyngeal spaces are preserved. The parotid and submandibular glands are normal. No sialolithiasis or salivary ductal dilatation. The thyroid gland is normal. There is no cervical lymphadenopathy. Upper chest: No pneumothorax or pleural effusion. No nodules or masses. Review of the MIP images confirms the above findings CTA HEAD FINDINGS Anterior circulation: --Intracranial internal carotid arteries: Normal. --Anterior cerebral arteries: Normal. --Middle cerebral arteries: Normal. --Posterior communicating arteries: Diminutive bilaterally Posterior circulation: --Posterior cerebral arteries: Normal. --Superior cerebellar arteries: Normal. --Basilar artery: Normal. --Anterior inferior cerebellar arteries: Normal. --Posterior inferior cerebellar arteries: Normal. Venous sinuses: As permitted by contrast timing, patent. Anatomic variants: None Delayed phase: Not performed. Review of the MIP images confirms the above findings IMPRESSION: 1. Narrowing emergent intracranial large vessel occlusion. 2. 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 3. Otherwise, no hemodynamically significant stenosis of the major cervical arteries. Electronically Signed: By: Ulyses Jarred M.D. On: 08/10/2016 00:04   Mr Brain Wo Contrast  Result Date: 08/10/2016 CLINICAL DATA:  Acute left-sided weakness EXAM: MRI HEAD WITHOUT CONTRAST MRA  HEAD WITHOUT CONTRAST TECHNIQUE: Multiplanar, multiecho pulse sequences of the brain and surrounding structures were obtained without intravenous contrast. Angiographic images of the head were obtained using MRA technique without contrast. COMPARISON:  CTA head and neck 08/09/2016 FINDINGS: MRI HEAD FINDINGS Brain: The midline structures are normal. There is focal diffusion restriction within the right caudate tail of and extending inferiorly to involve the posterior limb of the right internal capsule. There is no hemorrhage. No mass effect. There are additional punctate foci of diffusion restriction within the left parietal lobe and right occipital lobe. There is beginning confluent hyperintense T2-weighted signal within the periventricular white matter, most often seen in the setting of chronic microvascular ischemia. No mass lesion. No chronic microhemorrhage or cerebral amyloid angiopathy. No hydrocephalus, age advanced atrophy or lobar predominant volume loss. No dural abnormality or extra-axial collection. Skull and upper cervical spine: The visualized skull base, calvarium, upper cervical spine and extracranial soft tissues are normal. Sinuses/Orbits: No fluid levels or advanced mucosal thickening. No mastoid effusion. Normal orbits. MRA HEAD FINDINGS Intracranial internal carotid arteries: Normal. Anterior cerebral arteries: Normal. Middle cerebral arteries: Normal. Posterior communicating arteries: Present bilaterally. Posterior cerebral arteries: Normal. Basilar artery: Normal. Vertebral arteries: Left dominant. Normal. Superior cerebellar arteries: Normal. Anterior inferior cerebellar arteries: Not clearly seen, which is not uncommon. Posterior inferior cerebellar arteries: Normal. IMPRESSION: 1. Acute ischemia of the right caudate tail extending to the posterior limb of the right internal capsule, in keeping with acute left-sided weakness. 2. Punctate foci of acute ischemia within the left parietal lobe  and at the right temporal occipital junction. The distribution of lesions suggests a central cardioembolic process. 3. No hemorrhage or mass effect. 4. Chronic microvascular ischemia. 5. Normal intracranial MRA. Electronically Signed   By: Ulyses Jarred M.D.   On: 08/10/2016 04:10   Mr Jodene Nam Head/brain VW Cm  Result Date: 08/10/2016 CLINICAL DATA:  Acute left-sided weakness EXAM: MRI HEAD WITHOUT CONTRAST MRA HEAD WITHOUT CONTRAST TECHNIQUE: Multiplanar, multiecho pulse sequences  of the brain and surrounding structures were obtained without intravenous contrast. Angiographic images of the head were obtained using MRA technique without contrast. COMPARISON:  CTA head and neck 08/09/2016 FINDINGS: MRI HEAD FINDINGS Brain: The midline structures are normal. There is focal diffusion restriction within the right caudate tail of and extending inferiorly to involve the posterior limb of the right internal capsule. There is no hemorrhage. No mass effect. There are additional punctate foci of diffusion restriction within the left parietal lobe and right occipital lobe. There is beginning confluent hyperintense T2-weighted signal within the periventricular white matter, most often seen in the setting of chronic microvascular ischemia. No mass lesion. No chronic microhemorrhage or cerebral amyloid angiopathy. No hydrocephalus, age advanced atrophy or lobar predominant volume loss. No dural abnormality or extra-axial collection. Skull and upper cervical spine: The visualized skull base, calvarium, upper cervical spine and extracranial soft tissues are normal. Sinuses/Orbits: No fluid levels or advanced mucosal thickening. No mastoid effusion. Normal orbits. MRA HEAD FINDINGS Intracranial internal carotid arteries: Normal. Anterior cerebral arteries: Normal. Middle cerebral arteries: Normal. Posterior communicating arteries: Present bilaterally. Posterior cerebral arteries: Normal. Basilar artery: Normal. Vertebral arteries:  Left dominant. Normal. Superior cerebellar arteries: Normal. Anterior inferior cerebellar arteries: Not clearly seen, which is not uncommon. Posterior inferior cerebellar arteries: Normal. IMPRESSION: 1. Acute ischemia of the right caudate tail extending to the posterior limb of the right internal capsule, in keeping with acute left-sided weakness. 2. Punctate foci of acute ischemia within the left parietal lobe and at the right temporal occipital junction. The distribution of lesions suggests a central cardioembolic process. 3. No hemorrhage or mass effect. 4. Chronic microvascular ischemia. 5. Normal intracranial MRA. Electronically Signed   By: Ulyses Jarred M.D.   On: 08/10/2016 04:10   Ct Head Code Stroke W/o Cm  Addendum Date: 08/10/2016   ADDENDUM REPORT: 08/10/2016 00:13 ADDENDUM: These results were called by telephone at the time of interpretation on 08/10/2016 at 11:58 p.m. to Dr. Kerney Elbe, who verbally acknowledged these results. Electronically Signed   By: Ulyses Jarred M.D.   On: 08/10/2016 00:13   Result Date: 08/10/2016 CLINICAL DATA:  Code stroke.  Left-sided weakness EXAM: CT HEAD WITHOUT CONTRAST TECHNIQUE: Contiguous axial images were obtained from the base of the skull through the vertex without intravenous contrast. COMPARISON:  Head CT 05/14/2015 FINDINGS: Brain: No mass lesion, intraparenchymal hemorrhage or extra-axial collection. No evidence of acute cortical infarct. There is periventricular hypoattenuation compatible with chronic microvascular disease. Vascular: No hyperdense vessel or unexpected calcification. Skull: Normal visualized skull base, calvarium and extracranial soft tissues. Sinuses/Orbits: No sinus fluid levels or advanced mucosal thickening. No mastoid effusion. Normal orbits. ASPECTS [facility] Stroke Program Early CT Score) - Ganglionic level infarction (caudate, lentiform nuclei, internal capsule, insula, M1-M3 cortex): 7 - Supraganglionic infarction (M4-M6  cortex): 3 Total score (0-10 with 10 being normal): 10 IMPRESSION: 1. 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 2. ASPECTS is 10. Electronically Signed: By: Ulyses Jarred M.D. 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 Does the need for close, 24 hr/day medical supervision in concert with the patient's rehab needs make it unreasonable for this patient to be served in a less intensive setting? Yes 2. Co-Morbidities requiring supervision/potential complications: History of GI bleed, history of rheumatoid arthritis on chronic prednisone, history of lumbar fusion 3. Due to bladder management, bowel management, safety, skin/wound care, disease management, medication administration, pain management and patient education, does  the patient require 24 hr/day rehab nursing? Yes 4. Does the patient require coordinated care of a physician, rehab nurse, PT (1-2 hrs/day, 5 days/week) and OT (1-2 hrs/day, 5 days/week) to address physical and functional deficits in the context of the above medical diagnosis(es)? Yes Addressing deficits in the following areas: balance, endurance, locomotion, strength, transferring, bowel/bladder control, bathing, dressing, feeding, grooming, toileting and psychosocial support 5. Can the patient actively participate in an intensive therapy program of at least 3 hrs of therapy per day at least 5 days per week? Yes 6. The potential for patient to make measurable gains while on inpatient rehab is excellent 7. Anticipated functional outcomes upon discharge from inpatient rehab are modified independent and supervision  with PT, modified independent and supervision with OT, n/a with SLP. 8. Estimated rehab length of stay to reach the above functional goals is: 10-14d 9.  10. Does the patient have adequate social supports and living environment to accommodate these discharge functional  goals? Yes 11. Anticipated D/C setting: Home 12. Anticipated post D/C treatments: [place] therapy 13. Overall Rehab/Functional Prognosis: excellent  RECOMMENDATIONS: This patient's condition is appropriate for continued rehabilitative care in the following setting: CIR Patient has agreed to participate in recommended program. Yes Note that insurance prior authorization may be required for reimbursement for recommended care.  Comment: Needs to complete stroke workup  [name] M.D. [facility] FAAPM&R (Sports Med, Neuromuscular Med) Diplomate Am Board of Electrodiagnostic Med  Cathlyn Parsons., PA-C 08/10/2016    Revision History

## 2016-08-13 ENCOUNTER — Inpatient Hospital Stay (HOSPITAL_COMMUNITY): Payer: PPO

## 2016-08-13 DIAGNOSIS — R269 Unspecified abnormalities of gait and mobility: Secondary | ICD-10-CM

## 2016-08-13 DIAGNOSIS — K912 Postsurgical malabsorption, not elsewhere classified: Secondary | ICD-10-CM | POA: Diagnosis present

## 2016-08-13 DIAGNOSIS — I69398 Other sequelae of cerebral infarction: Secondary | ICD-10-CM

## 2016-08-13 NOTE — Progress Notes (Signed)
Patient was walking in hallway with his wife and bumped his left forearm on the metal encasement that holds the wall computers, causing a minor skin tear. Mild bleeding noted. Cleansed and covered with a dry dressing. Patient tolerated well.

## 2016-08-13 NOTE — Progress Notes (Signed)
Occupational Therapy Session Note  Patient Details  Name: [name] MRN: [id number] Date of Birth: 10-20-1940  Today's Date: 08/13/2016 OT Individual Time: 1400-1445 OT Individual Time Calculation (min): 45 min    Short Term Goals: Week 1:  OT Short Term Goal 1 (Week 1): STG=LTG d/t short LOS  Skilled Therapeutic Interventions/Progress Updates: Therapeutic activity with emphasis on improved endurance, BLE strengthening, functional mobility using RW.   Pt received supine in bed, HOB elevated, with pt preparing to watch NASCAR race on TV with wife present.   As OT session was the only therapy scheduled during this date, OT offered BADL however pt reported completion of BADL with setup assist and supervision from his wife who has remained near bedside since 9 am.   Pt reports improved LUE functioning as near baseline but is aware of limitations in mobility with LLE weakness.   Pt requested therapeutic activity to improve mobility, skilled use of RW and LE strengthening.   With setup to provide RW, pt ambulated to rehab gym with supervision.   OT noted deviation with gait, RLE externally rotated during stride, however pt maintains deviation as present since bilateral knee repair, many years ago.   Pt was familiar with NuStep from prior rehab experience and requested strengthening session.   With setup to adjust equipment, pt completed 15 min of strengthening (7 min with legs and arms, 8 minutes with legs only), resistance level 4.   Pt reported exertion as 14 on BORG scale and proceeded back to his room for continued therapy to address dynamic standing balance as planned by OT to practice item retrieval off floor, while standing.   Upon entry into his room, room was fully occupied by 4 family members who immediately engaged with pt.   Pt elected to defer continued therapy at this time, 15 min prior to end of session to interact with guests.   Overall, pt was supervision assist during activity, mildly  fatigued by 15 min of exercise with no observed loss of balance of buckling of his left knee during mobility or transfers.     Therapy Documentation Precautions:  Precautions Precautions: Fall Precaution Comments: Mild L hemiparesis, LLE fatigues with ambulation Restrictions Weight Bearing Restrictions: No   General: General OT Amount of Missed Time: 15 Minutes   Vital Signs: Therapy Vitals Temp: 98.1 F (36.7 C) Temp Source: Oral Pulse Rate: 63 Resp: 18 BP: (!) 144/77 Patient Position (if appropriate): Sitting Oxygen Therapy SpO2: 100 % O2 Device: Not Delivered   Pain: Pain Assessment Pain Assessment: No/denies pain   ADL: ADL ADL Comments: see Functional Assessment Tool   See Function Navigator for Current Functional Status.   Therapy/Group: Individual Therapy  [place] 08/13/2016, 2:56 PM

## 2016-08-13 NOTE — Progress Notes (Signed)
Subjective/Complaints: "I only have 8" of bowel left"  Food "runs through me"  ROS-  No CP,SOB, N/V/D  Objective: Vital Signs: Blood pressure (!) 159/75, pulse 65, temperature 98.2 F (36.8 C), temperature source Oral, resp. rate 16, height 5' 7"  (1.702 m), weight 78.7 kg (173 lb 6.4 oz), SpO2 97 %. No results found. Results for orders placed or performed during the hospital encounter of 08/11/16 (from the past 72 hour(s))  CBC     Status: Abnormal   Collection Time: 08/11/16  7:54 PM  Result Value Ref Range   WBC 7.8 4.0 - 10.5 K/uL   RBC 4.40 4.22 - 5.81 MIL/uL   Hemoglobin 9.7 (L) 13.0 - 17.0 g/dL   HCT 33.3 (L) 39.0 - 52.0 %   MCV 75.7 (L) 78.0 - 100.0 fL   MCH 22.0 (L) 26.0 - 34.0 pg   MCHC 29.1 (L) 30.0 - 36.0 g/dL   RDW 16.5 (H) 11.5 - 15.5 %   Platelets 243 150 - 400 K/uL  Creatinine, serum     Status: None   Collection Time: 08/11/16  7:54 PM  Result Value Ref Range   Creatinine, Ser 1.10 0.61 - 1.24 mg/dL   GFR calc non Af Amer >60 >60 mL/min   GFR calc Af Amer >60 >60 mL/min    Comment: (NOTE) The eGFR has been calculated using the CKD EPI equation. This calculation has not been validated in all clinical situations. eGFR's persistently <60 mL/min signify possible Chronic Kidney Disease.      HEENT: normal Cardio: RRR and no murmur Resp: RRR and no murmur GI: BS positive and NT, ND Extremity:  No Edema Skin:   Intact Neuro: Alert/Oriented, Cranial Nerve II-XII normal, Normal Sensory, Abnormal Motor 5/5 on R sie , 4+ on Left side and Abnormal FMC Ataxic/ dec FMC Musc/Skel:  Other bilateral healed TKR incision Gen NAD   Assessment/Plan: 1. Functional deficits secondary to Right caudate /PLIC infarct which require 3+ hours per day of interdisciplinary therapy in a comprehensive inpatient rehab setting. Physiatrist is providing close team supervision and 24 hour management of active medical problems listed below. Physiatrist and rehab team continue to  assess barriers to discharge/monitor patient progress toward functional and medical goals. FIM: Function - Bathing Position: Shower Body parts bathed by patient: Right arm, Left arm, Chest, Abdomen, Front perineal area, Buttocks, Right upper leg, Left upper leg, Right lower leg, Left lower leg Body parts bathed by helper: Back Assist Level: Supervision or verbal cues  Function- Upper Body Dressing/Undressing What is the patient wearing?: Pull over shirt/dress Pull over shirt/dress - Perfomed by patient: Thread/unthread left sleeve, Thread/unthread right sleeve, Put head through opening, Pull shirt over trunk Assist Level: Set up Set up : To obtain clothing/put away Function - Lower Body Dressing/Undressing What is the patient wearing?: Underwear, Pants, Socks, Shoes Position: Wheelchair/chair at sink Underwear - Performed by patient: Thread/unthread right underwear leg, Thread/unthread left underwear leg, Pull underwear up/down Pants- Performed by patient: Thread/unthread right pants leg, Thread/unthread left pants leg, Pull pants up/down, Fasten/unfasten pants Socks - Performed by patient: Don/doff right sock, Don/doff left sock Shoes - Performed by patient: Don/doff right shoe, Don/doff left shoe, Fasten right, Fasten left Assist for lower body dressing: More than reasonable time, Supervision or verbal cues, Set up Set up : To obtain clothing/put away  Function - Toileting Toileting steps completed by patient: Adjust clothing prior to toileting, Performs perineal hygiene, Adjust clothing after toileting Toileting Assistive Devices: Grab bar  or rail  Function Midwife transfer assistive device: Elevated toilet seat/BSC over toilet, Walker Assist level to toilet: Touching or steadying assistance (Pt > 75%) Assist level from toilet: Supervision or verbal cues  Function - Chair/bed transfer Chair/bed transfer method: Stand pivot, Squat pivot, Ambulatory Chair/bed  transfer assist level: Touching or steadying assistance (Pt > 75%) Chair/bed transfer assistive device: Armrests, Walker Chair/bed transfer details: Verbal cues for technique, Verbal cues for precautions/safety, Verbal cues for safe use of DME/AE, Verbal cues for sequencing  Function - Locomotion: Wheelchair Will patient use wheelchair at discharge?: No Function - Locomotion: Ambulation Assistive device: No device Max distance: 100 Assist level: Moderate assist (Pt 50 - 74%) Assist level: Moderate assist (Pt 50 - 74%) Assist level: Moderate assist (Pt 50 - 74%) Walk 150 feet activity did not occur: Safety/medical concerns Assist level: Moderate assist (Pt 50 - 74%)  Function - Comprehension Comprehension: Auditory Comprehension assist level: Understands basic 90% of the time/cues < 10% of the time  Function - Expression Expression: Verbal Expression assist level: Expresses basic needs/ideas: With no assist  Function - Social Interaction Social Interaction assist level: Interacts appropriately with others - No medications needed.  Function - Problem Solving Problem solving assist level: Solves basic problems with no assist  Function - Memory Memory assist level: Recognizes or recalls 90% of the time/requires cueing < 10% of the time Patient normally able to recall (first 3 days only): Current season, Location of own room, Staff names and faces, That he or she is in a hospital  Medical Problem List and Plan: 1.  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  DVT Prophylaxis/Anticoagulation: Subcutaneous Lovenox.Normal plt  Venous Doppler studies negative 3. Pain Management: Ultram as needed 4. Mood: Provide emotional support 5. Neuropsych: This patient is capable of making decisions on his own behalf. 6. Skin/Wound Care: Routine skin checks 7. Fluids/Electrolytes/Nutrition:  Routine I&O's with follow-up chemistries 8. Rheumatoid arthritis.Arava 20 mg daily, chronic prednisone 5 mg daily, no current joint pain or swelling, S/P B TKR, pt state hands and feet have been mainly affected in past 9. Hyperlipidemia. Lipitor 10. History of diverticulosis with recurrent GI bleed status post partial colectomy in the past.Short bowel syndrome trial low residue diet  LOS (Days) 2 A FACE TO FACE EVALUATION WAS PERFORMED  KIRSTEINS,ANDREW E 08/13/2016, 7:47 AM

## 2016-08-14 ENCOUNTER — Inpatient Hospital Stay (HOSPITAL_COMMUNITY): Payer: PPO

## 2016-08-14 ENCOUNTER — Inpatient Hospital Stay (HOSPITAL_COMMUNITY): Payer: PPO | Admitting: Occupational Therapy

## 2016-08-14 ENCOUNTER — Inpatient Hospital Stay (HOSPITAL_COMMUNITY): Payer: PPO | Admitting: Physical Therapy

## 2016-08-14 DIAGNOSIS — R269 Unspecified abnormalities of gait and mobility: Secondary | ICD-10-CM

## 2016-08-14 DIAGNOSIS — M05711 Rheumatoid arthritis with rheumatoid factor of right shoulder without organ or systems involvement: Secondary | ICD-10-CM

## 2016-08-14 DIAGNOSIS — I69398 Other sequelae of cerebral infarction: Secondary | ICD-10-CM

## 2016-08-14 DIAGNOSIS — M05712 Rheumatoid arthritis with rheumatoid factor of left shoulder without organ or systems involvement: Secondary | ICD-10-CM

## 2016-08-14 DIAGNOSIS — K912 Postsurgical malabsorption, not elsewhere classified: Secondary | ICD-10-CM

## 2016-08-14 LAB — COMPREHENSIVE METABOLIC PANEL
ALK PHOS: 72 U/L (ref 38–126)
ALT: 9 U/L — ABNORMAL LOW (ref 17–63)
ANION GAP: 6 (ref 5–15)
AST: 16 U/L (ref 15–41)
Albumin: 3.1 g/dL — ABNORMAL LOW (ref 3.5–5.0)
BUN: 13 mg/dL (ref 6–20)
CALCIUM: 8.5 mg/dL — AB (ref 8.9–10.3)
CO2: 27 mmol/L (ref 22–32)
Chloride: 107 mmol/L (ref 101–111)
Creatinine, Ser: 1.15 mg/dL (ref 0.61–1.24)
GFR calc non Af Amer: >60 mL/min (ref >60)
Glucose, Bld: 94 mg/dL (ref 65–99)
POTASSIUM: 3.9 mmol/L (ref 3.5–5.1)
SODIUM: 140 mmol/L (ref 135–145)
TOTAL PROTEIN: 5.9 g/dL — AB (ref 6.5–8.1)
Total Bilirubin: 0.6 mg/dL (ref 0.3–1.2)

## 2016-08-14 LAB — CBC WITH DIFFERENTIAL/PLATELET
Basophils Absolute: 0.1 10*3/uL (ref 0.0–0.1)
Basophils Relative: 1 %
EOS PCT: 4 %
Eosinophils Absolute: 0.3 10*3/uL (ref 0.0–0.7)
HCT: 32.2 % — ABNORMAL LOW (ref 39.0–52.0)
HEMOGLOBIN: 9.4 g/dL — AB (ref 13.0–17.0)
LYMPHS ABS: 2 10*3/uL (ref 0.7–4.0)
Lymphocytes Relative: 29 %
MCH: 22.1 pg — AB (ref 26.0–34.0)
MCHC: 29.2 g/dL — AB (ref 30.0–36.0)
MCV: 75.6 fL — ABNORMAL LOW (ref 78.0–100.0)
MONO ABS: 1.1 10*3/uL — AB (ref 0.1–1.0)
MONOS PCT: 15 %
Neutro Abs: 3.6 10*3/uL (ref 1.7–7.7)
Neutrophils Relative %: 51 %
Platelets: 245 10*3/uL (ref 150–400)
RBC: 4.26 MIL/uL (ref 4.22–5.81)
RDW: 16.5 % — AB (ref 11.5–15.5)
WBC: 7 10*3/uL (ref 4.0–10.5)

## 2016-08-14 LAB — IRON AND TIBC
Iron: 10 ug/dL — ABNORMAL LOW (ref 45–182)
SATURATION RATIOS: 3 % — AB (ref 17.9–39.5)
TIBC: 360 ug/dL (ref 250–450)
UIBC: 350 ug/dL

## 2016-08-14 NOTE — Progress Notes (Signed)
Patient information reviewed and entered into eRehab system by Lahna Nath, RN, CRRN, PPS Coordinator.  Information including medical coding and functional independence measure will be reviewed and updated through discharge.     Per nursing patient was given "Data Collection Information Summary for Patients in Inpatient Rehabilitation Facilities with attached "Privacy Act Statement-Health Care Records" upon admission.  

## 2016-08-14 NOTE — Progress Notes (Signed)
Physical Therapy Session Note  Patient Details  Name: [name] MRN: [id number] Date of Birth: 1940/05/14  Today's Date: 08/14/2016 PT Individual Time: 0950-1020 PT Individual Time Calculation (min): 30 min   Short Term Goals: Week 1:  PT Short Term Goal 1 (Week 1): pt will ambulate with LRAD and min assist PT Short Term Goal 2 (Week 1): pt will increase Berg score to >32/56  Skilled Therapeutic Interventions/Progress Updates: Pt presented in w/c with wife present agreeable to therapy. PT ambulated to rehab gym with RW and supervision. Instructed in and performed [place] B exercises x 10 bilaterally, ascend/descend x 8 steps 1 rail with supervision. Performed all activities with good tolerance. Pt returned to room in same manner as prior and remained in w/c with wife present and needs met.      Therapy Documentation Precautions:  Precautions Precautions: Fall Precaution Comments: Mild L hemiparesis, LLE fatigues with ambulation Restrictions Weight Bearing Restrictions: No General:   Vital Signs: Therapy Vitals Temp: 98.5 F (36.9 C) Temp Source: Oral Pulse Rate: 76 Resp: 18 BP: (!) 164/79 (RN notified) Patient Position (if appropriate): Sitting Oxygen Therapy SpO2: 99 % O2 Device: Not Delivered   See Function Navigator for Current Functional Status.   Therapy/Group: Individual Therapy  Tocarra Gassen  Yanis Larin, PTA  08/14/2016, 3:40 PM

## 2016-08-14 NOTE — Care Management Note (Signed)
[facility] Individual Statement of Services  Patient Name:  [name]  Date:  08/14/2016  Welcome to the [facility].  Our goal is to provide you with an individualized program based on your diagnosis and situation, designed to meet your specific needs.  With this comprehensive rehabilitation program, you will be expected to participate in at least 3 hours of rehabilitation therapies Monday-Friday, with modified therapy programming on the weekends.  Your rehabilitation program will include the following services:  Physical Therapy (PT), Occupational Therapy (OT), Speech Therapy (ST), 24 hour per day rehabilitation nursing, Case Management (Social Worker), Rehabilitation Medicine, Nutrition Services and Pharmacy Services  Weekly team conferences will be held on Wednesday to discuss your progress.  Your Social Worker will talk with you frequently to get your input and to update you on team discussions.  Team conferences with you and your family in attendance may also be held.  Expected length of stay: 7-10 days  Overall anticipated outcome: mod/I level  Depending on your progress and recovery, your program may change. Your Social Worker will coordinate services and will keep you informed of any changes. Your Social Worker's name and contact numbers are listed  below.  The following services may also be recommended but are not provided by the [facility] will be made to provide these services after discharge if needed.  Arrangements include referral to agencies that provide these services.  Your insurance has been verified to be:  [employer] Your primary doctor is:  Hal Stoneking  Pertinent information will be shared with your doctor and your insurance company.  Social Worker:  Ovidio Kin, [employer] or ([phone]  Information discussed with and copy given to patient by: Elease Hashimoto, 08/14/2016, 10:20 AM

## 2016-08-14 NOTE — Discharge Instructions (Addendum)
Inpatient Rehab Discharge Instructions  [name] Discharge date and time: No discharge date for patient encounter.   Activities/Precautions/ Functional Status: Activity: activity as tolerated Diet: regular diet Wound Care: none needed Functional status:  ___ No restrictions     ___ Walk up steps independently ___ 24/7 supervision/assistance   ___ Walk up steps with assistance ___ Intermittent supervision/assistance  ___ Bathe/dress independently ___ Walk with walker     _x__ Bathe/dress with assistance ___ Walk Independently    ___ Shower independently ___ Walk with assistance    ___ Shower with assistance ___ No alcohol     ___ Return to work/school ________  Special Instructions: No driving    COMMUNITY REFERRALS UPON DISCHARGE:    Outpatient: PT  Agency:[facility] [phone]   Date of Last Service:08/17/2016  Appointment Date/Time:MAY 7 MONDAY 12:45-2:00 PM  Medical Equipment/Items Ordered:NO NEEDS-HAS ROLLING WALKER   GENERAL COMMUNITY RESOURCES FOR PATIENT/FAMILY: Support Groups:[facility] SUPPORT GROUP EVERY SECOND Thursday @ 3:00-4:00 PM ON THE REHAB UNIT QUESTIONS CONTACT [place] [phone]  STROKE/TIA DISCHARGE INSTRUCTIONS SMOKING Cigarette smoking nearly doubles your risk of having a stroke & is the single most alterable risk factor  If you smoke or have smoked in the last 12 months, you are advised to quit smoking for your health.  Most of the excess cardiovascular risk related to smoking disappears within a year of stopping.  Ask you doctor about anti-smoking medications  [place] Quit Line: 1-800-QUIT NOW  Free Smoking Cessation Classes [phone]  CHOLESTEROL Know your levels; limit fat & cholesterol in your diet  Lipid Panel     Component Value Date/Time   CHOL 140 08/10/2016 0737   TRIG 109 08/10/2016 0737   HDL 48 08/10/2016 0737   CHOLHDL 2.9 08/10/2016 0737   VLDL 22 08/10/2016 0737   LDLCALC 70 08/10/2016 0737       Many patients benefit from treatment even if their cholesterol is at goal.  Goal: Total Cholesterol (CHOL) less than 160  Goal:  Triglycerides (TRIG) less than 150  Goal:  HDL greater than 40  Goal:  LDL (LDLCALC) less than 100   BLOOD PRESSURE American Stroke Association blood pressure target is less that 120/80 mm/Hg  Your discharge blood pressure is:  BP: (!) 142/60  Monitor your blood pressure  Limit your salt and alcohol intake  Many individuals will require more than one medication for high blood pressure  DIABETES (A1c is a blood sugar average for last 3 months) Goal HGBA1c is under 7% (HBGA1c is blood sugar average for last 3 months)  Diabetes: No known diagnosis of diabetes    Lab Results  Component Value Date   HGBA1C 5.8 (H) 08/10/2016     Your HGBA1c can be lowered with medications, healthy diet, and exercise.  Check your blood sugar as directed by your physician  Call your physician if you experience unexplained or low blood sugars.  PHYSICAL ACTIVITY/REHABILITATION Goal is 30 minutes at least 4 days per week  Activity: Increase activity slowly, Therapies: Physical Therapy: Home Health Return to work:   Activity decreases your risk of heart attack and stroke and makes your heart stronger.  It helps control your weight and blood pressure; helps you relax and can improve your mood.  Participate in a regular exercise program.  Talk with your doctor about the best form of exercise for you (dancing, walking, swimming, cycling).  DIET/WEIGHT Goal is to maintain a healthy weight  Your discharge diet is: Diet Heart  Room service appropriate? Yes; Fluid consistency: Thin  liquids Your height is:  Height: 5\' 7"  (170.2 cm) Your current weight is: Weight: 78.7 kg (173 lb 6.4 oz) Your Body Mass Index (BMI) is:  BMI (Calculated): 27.2  Following the type of diet specifically designed for you will help prevent another stroke.  Your goal weight range is:    Your goal  Body Mass Index (BMI) is 19-24.  Healthy food habits can help reduce 3 risk factors for stroke:  High cholesterol, hypertension, and excess weight.  RESOURCES Stroke/Support Group:  Call [phone]   STROKE EDUCATION PROVIDED/REVIEWED AND GIVEN TO PATIENT Stroke warning signs and symptoms How to activate emergency medical system (call 911). Medications prescribed at discharge. Need for follow-up after discharge. Personal risk factors for stroke. Pneumonia vaccine given:  Flu vaccine given:  My questions have been answered, the writing is legible, and I understand these instructions.  I will adhere to these goals & educational materials that have been provided to me after my discharge from the hospital.      My questions have been answered and I understand these instructions. I will adhere to these goals and the provided educational materials after my discharge from the hospital.  Patient/Caregiver Signature _______________________________ Date __________  Clinician Signature _______________________________________ Date __________  Please bring this form and your medication list with you to all your follow-up doctor's appointments. Information on my medicine - ELIQUIS (apixaban)  This medication education was reviewed with me or my healthcare representative as part of my discharge preparation.    Why was Eliquis prescribed for you? Eliquis was prescribed for you to reduce the risk of a blood clot forming that can cause a stroke if you have a medical condition called atrial fibrillation (a type of irregular heartbeat).  What do You need to know about Eliquis ? Take your Eliquis TWICE DAILY - one tablet in the morning and one tablet in the evening with or without food. If you have difficulty swallowing the tablet whole please discuss with your pharmacist how to take the medication safely.  Take Eliquis exactly as prescribed by your doctor and DO NOT stop taking Eliquis without  talking to the doctor who prescribed the medication.  Stopping may increase your risk of developing a stroke.  Refill your prescription before you run out.  After discharge, you should have regular check-up appointments with your healthcare provider that is prescribing your Eliquis.  In the future your dose may need to be changed if your kidney function or weight changes by a significant amount or as you get older.  What do you do if you miss a dose? If you miss a dose, take it as soon as you remember on the same day and resume taking twice daily.  Do not take more than one dose of ELIQUIS at the same time to make up a missed dose.  Important Safety Information A possible side effect of Eliquis is bleeding. You should call your healthcare provider right away if you experience any of the following: ? Bleeding from an injury or your nose that does not stop. ? Unusual colored urine (red or dark brown) or unusual colored stools (red or black). ? Unusual bruising for unknown reasons. ? A serious fall or if you hit your head (even if there is no bleeding).  Some medicines may interact with Eliquis and might increase your risk of bleeding or clotting while on Eliquis. To help avoid this, consult your healthcare provider or pharmacist  prior to using any new prescription or non-prescription medications, including herbals, vitamins, non-steroidal anti-inflammatory drugs (NSAIDs) and supplements.  This website has more information on Eliquis (apixaban): http://[email or web address].com/eliquis/home

## 2016-08-14 NOTE — Progress Notes (Signed)
Social Work Assessment and Plan Social Work Assessment and Plan  Patient Details  Name: [name] MRN: [id number] Date of Birth: 1940/10/14  Today's Date: 08/14/2016  Problem List:  Patient Active Problem List   Diagnosis Date Noted  . 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 Gait disturbance, post-stroke 08/13/2016  . 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 Acute ischemic stroke ([place]) 08/10/2016  . Hypertensive urgency 08/10/2016  . 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 Acute left-sided weakness   . Left-sided weakness   . History of lower GI bleeding   . Hiatal hernia   . 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 Acute post-hemorrhagic anemia 02/03/2013  . Rheumatoid arthritis ([place]) 04/14/2011  . Long-term current use of steroids 04/14/2011  . Iron deficiency anemia 04/14/2011   Past Medical History:  Past Medical History:  Diagnosis Date  . Blood transfusion    " no reaction from transfusion"  . BPH (benign prostatic hyperplasia)   . Bruises easily   . Bursitis    chronic hip pain  . Cancer [facility])    prostate cancer cells  . Disc disease, degenerative, lumbar or lumbosacral    Chronic back pain now  . Diverticulitis   . Esophageal ring    requiring dilations   . GERD (gastroesophageal reflux disease)   . History of GI bleed    "vessels burst in colon" x2  . History of kidney stones    chronic  . History of vertigo   . Osteoarthritis   . Osteoporosis   . Pneumonia    yrs ago  . Rash    GROIN - ITCHING PAST 3 MONTHS - it is gone now 05/02/16  . Rheumatoid arthritis(714.0)    27 years  . Spondylisthesis   . Ureteral stone 11/2012   Past Surgical History:  Past Surgical History:  Procedure Laterality Date  . BALLOON DILATION N/A 10/01/2012   Procedure: BALLOON DILATION;  Surgeon: Garlan Fair, MD;  [facility];  Service: Endoscopy;  Laterality: N/A;  . COLECTOMY N/A 06/10/2013   Procedure: TOTAL abdominal COLECTOMY;  Surgeon: Odis Hollingshead, MD;  Location: [facility];  Service: General;  Laterality: N/A;  . COLONOSCOPY    . CYSTOSCOPY WITH URETEROSCOPY Right 12/05/2012   Procedure: CYSTOSCOPY WITH RIGHT URETEROSCOPY AND STONE EXTRACTION;  Surgeon: Malka So, MD;  Location: [facility];  Service: Urology;  Laterality: Right;  . ESOPHAGOGASTRODUODENOSCOPY N/A 10/01/2012   Procedure: ESOPHAGOGASTRODUODENOSCOPY (EGD);  Surgeon: Garlan Fair, MD;  Location: [facility];  Service: Endoscopy;  Laterality: N/A;  . HERNIA REPAIR    . INGUINAL HERNIA REPAIR  2007   Bilateral   . Lithotripsy for nephrolithiasis     Pt and wife not sure when but here in [place]  . Lysis of Adhesion, small bowel resection  2009  . MAXIMUM ACCESS (MAS)POSTERIOR LUMBAR INTERBODY FUSION (PLIF) 1 LEVEL N/A 05/08/2016   Procedure: LUMBAR FOUR-FIVE FOR MAXIMUM ACCESS (MAS) POSTERIOR LUMBAR INTERBODY FUSION;  Surgeon: Kary Kos, MD;  Location: [place];  Service: Neurosurgery;  Laterality: N/A;  . NISSEN FUNDOPLICATION     about 8144, pt not sure records not currently available  . TOTAL KNEE ARTHROPLASTY  2003   Bilateral  . TRANSURETHRAL PROSTATECTOMY WITH GYRUS INSTRUMENTS N/A 12/05/2012   Procedure: TRANSURETHRAL PROSTATECTOMY WITH GYRUS INSTRUMENTS;  Surgeon: Malka So, MD;  Location: [facility];  Service: Urology;  Laterality: N/A;  . Ventral Hernia Repair with Mesh     Social History:  reports that he has never smoked. He has never used smokeless tobacco. He reports that he does not drink alcohol or use drugs.  Family / Support Systems Marital Status: Married Patient Roles: Spouse, Other (Comment), Parent Spouse/Significant Other: [phone] Children: daughter Other Supports: friends and church members Anticipated Caregiver: [name] Ability/Limitations of Caregiver: None-good health Caregiver Availability:  24/7 Family Dynamics: Close knit family along with friends who will assist if needed. Pt is doing quite well and not be here veyr long. He is already ambulating with a rolling walker. Wife is pleased with his progress thus far.  Social History Preferred language: English Religion: Baptist Cultural Background: No issues Education: [employer] Read: Yes Write: Yes Employment Status: Retired Freight forwarder Issues: No issues Guardian/Conservator: None-according to MD pt is capable of making his own decisions while here. Wife will be here daily and observe him in therapies.   Abuse/Neglect Physical Abuse: Denies Verbal Abuse: Denies Sexual Abuse: Denies Exploitation of patient/patient's resources: Denies Self-Neglect: Denies  Emotional Status Pt's affect, behavior adn adjustment status: Pt is motivated to do well and recover from this stroke. He is quite pleased with who well he is doing and progressing in his recovery. He is hoping to be here until the end of the week, therapy team confirms this also. Recent Psychosocial Issues: other health issues-mostly GI and history of prostate cancer Pyschiatric History: No history pt feels he is doing well and relying upon his wife and faith in God. He is able to express his feelings and concerns. Wife is a strong support of him. Will monitor while here. Substance Abuse History: No issues  Patient / Family Perceptions, Expectations & Goals Pt/Family understanding of illness & functional limitations: Pt and wife can explain his stroke and deficits. He is making good progress in his therapies and function and feel he will be here a short time. Both have spoken with the MD and feel they have a good understanding. Premorbid pt/family roles/activities: Husband, Father, grandfather, chruch member, retiree, etc Anticipated changes in roles/activities/participation: resume Pt/family expectations/goals: Pt states: " I want to get back to what I  was doing before this happen."  Wife states: " He is doing so well I am so proud of him."  [employer]: None Premorbid Home Care/DME Agencies: None Transportation available at discharge: Wife Resource referrals recommended: Support group (specify)  Discharge Planning Living Arrangements: Spouse/significant other Support Systems: Spouse/significant other, Children, Other relatives, Water engineer, Social worker community Type of Residence: Private residence Insurance underwriter Resources: Multimedia programmer (specify) ([employer]) Financial Resources: Social Security Financial Screen Referred: No Living Expenses: Own Money Management: Spouse, Patient Does the patient have any problems obtaining your medications?: No Home Management: Both wife does inside and pt does the outside work Patient/Family Preliminary Plans: Return home with wife who can provide assistance if needed. She has been here and observed pt in therapies and can see his progress since stroke. Aware team meeting on Wed and will inform goals and target discharge date. Social Work Anticipated Follow Up Needs: HH/OP, Support Group  Clinical Impression Pleasant gentleman who is doing well and will only be here one week. Wife is here and attending therapies with pt today. She can see his progress and is pleased with this. Goals mod/I level and maybe end of this week go home. Will work on discharge needs and provide support while here.  Elease Hashimoto 08/14/2016, 10:54 AM

## 2016-08-14 NOTE — Discharge Summary (Signed)
Physician Discharge Summary  Shivaan F [place] WUJ:[id number] DOB: 1940-07-26 DOA: 08/09/2016  PCP: [name], MD  Admit date: 08/09/2016 Discharge date: 08/14/2016  Time spent: 35 minutes  Recommendations for Outpatient Follow-up:  1. Continue eliquis 5 bid for 2/2 stroke prevention 2. Needs therapy and modalities at CIR when available 3. Avoid NSAIDs 4. Follow with Dr. 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  Note dprior Afib.   NO need ILR.  Placed on high intensity statin on d/c and appropriate for CIR Discussed with GI Dr. [name] who is okay with patient being on eliquis--will need OP follow up on d/c from CIR prn dr. [name]   Discharge Exam: Vitals:   08/11/16 1041 08/11/16 1335  BP: (!) 177/73 (!) 169/93  Pulse: 64 77  Resp: 20 20  Temp: 98.3 F (36.8 C) 98.4 F (36.9 C)    General: alert pleasant orietned and  in nad Cardiovascular: s1 s2 no m/r/ Respiratory: clear slght defici to L sde in terms of power No past pointing Reflexes equivocal  Discharge Instructions   Discharge Instructions    Ambulatory referral to Neurology    Complete by:  As directed    Follow up with Dr. Leta Baptist at [facility] in 6 weeks. Thanks.     Discharge Medication List as of 08/11/2016  3:43 PM    START taking these medications   Details  apixaban (ELIQUIS STARTER PACK) 5 MG TABS tablet Take 1 tablet (5 mg total) by mouth 2 (two) times daily., Starting Fri 08/11/2016, Normal    atorvastatin (LIPITOR) 80 MG tablet Take 0.5 tablets (40 mg total) by mouth daily at 6 PM., Starting Fri 08/11/2016, No Print      CONTINUE these medications which have NOT CHANGED   Details  acetaminophen (TYLENOL) 650 MG CR tablet Take 650-1,300 mg by mouth 2 (two) times daily. 1300 mg in the morning & 650 mg at night, Historical Med    diphenhydramine-acetaminophen (TYLENOL PM) 25-500 MG TABS tablet Take 1 tablet by mouth at bedtime., Historical Med    leflunomide (ARAVA) 20 MG tablet Take 20 mg by mouth daily. , Starting Wed 07/16/2013, Historical Med    omeprazole (PRILOSEC) 20 MG capsule Take 20 mg by mouth daily.  , Historical Med    predniSONE (DELTASONE) 5 MG tablet Take 5 mg by mouth  daily., Historical Med    tetrahydrozoline-zinc (VISINE-AC) 0.05-0.25 % ophthalmic solution Place 2 drops into both eyes 3 (three) times daily as needed (for allergies)., Historical Med    traMADol (ULTRAM) 50 MG tablet Take 50 mg by mouth 2 (two) times daily., Historical Med       Allergies  Allergen Reactions  . Clindamycin/Lincomycin Hives, Shortness Of Breath and Swelling  . Enbrel [Etanercept] Swelling    SEVERE LEG SWELLING   . Penicillins Hives and Shortness Of Breath    Has patient had a PCN reaction causing immediate rash, facial/tongue/throat swelling, SOB or lightheadedness with hypotension: Yes Has patient had a PCN reaction causing  severe rash involving mucus membranes or skin necrosis: Yes Has patient had a PCN reaction that required hospitalization No Has patient had a PCN reaction occurring within the last 10 years: No If all of the above answers are "NO", then may proceed with Cephalosporin use.   . Codeine Hives and Swelling   Follow-up Information    [name], MD Follow up.   Specialty:  Internal Medicine Contact information: [address] [place] [address] [phone]        Andrey Spearman, MD Follow up in 6 week(s).   Specialties:  Neurology, Radiology Why:  stroke clinic. office will call with appt date and time Contact information: [address] [place] [address] [phone]            The results of significant diagnostics from this hospitalization (including imaging, microbiology, ancillary and laboratory) are listed below for reference.    Significant Diagnostic Studies: Ct Angio Head W Or Wo Contrast  Addendum Date: 08/10/2016   ADDENDUM REPORT: 08/10/2016 03:59 ADDENDUM: There is a transcription error in the 1st impression point. This should read, "No emergent large vessel occlusion." Electronically Signed   By: Ulyses Jarred M.D.   On: 08/10/2016 03:59   Result Date: 08/10/2016 CLINICAL DATA:  Left-sided weakness EXAM: CT ANGIOGRAPHY HEAD AND NECK TECHNIQUE: Multidetector CT imaging of the head and neck was performed using the standard protocol during bolus administration of intravenous contrast. Multiplanar CT image reconstructions and MIPs were obtained to evaluate the vascular anatomy. Carotid stenosis measurements (when applicable) are obtained utilizing NASCET criteria, using the distal internal carotid diameter as the denominator. CONTRAST:  50 mL Isovue 370 COMPARISON:  Head CT 08/09/2016 FINDINGS: CTA NECK FINDINGS Aortic arch: There is no aneurysm or dissection of the visualized ascending aorta or aortic arch. There is a normal 3  vessel branching pattern. The visualized proximal subclavian arteries are normal. Right carotid system: The right common carotid origin is widely patent. There is no common carotid or internal carotid artery dissection or aneurysm. No hemodynamically significant stenosis. Mild atherosclerotic calcification of the proximal internal carotid artery. Or Left carotid system: The left common carotid origin is widely patent. There is no common carotid or internal carotid artery dissection or aneurysm. No hemodynamically significant stenosis. Vertebral arteries: The vertebral system is codominant. There is severe narrowing of the left vertebral artery origin due to atherosclerotic calcification. Both vertebral arteries are otherwise normal to their confluence with the basilar artery. Skeleton: There is no bony spinal canal stenosis. No lytic or blastic lesions. Other neck: The nasopharynx is clear. The oropharynx and hypopharynx are normal. The epiglottis is normal. The supraglottic larynx, glottis and subglottic larynx are normal. No retropharyngeal collection. The parapharyngeal spaces are preserved. The parotid and submandibular glands are normal. No sialolithiasis or salivary ductal dilatation. The  thyroid gland is normal. There is no cervical lymphadenopathy. Upper chest: No pneumothorax or pleural effusion. No nodules or masses. Review of the MIP images confirms the above findings CTA HEAD FINDINGS Anterior circulation: --Intracranial internal carotid arteries: Normal. --Anterior cerebral arteries: Normal. --Middle cerebral arteries: Normal. --Posterior communicating arteries: Diminutive bilaterally Posterior circulation: --Posterior cerebral arteries: Normal. --Superior cerebellar arteries: Normal. --Basilar artery: Normal. --Anterior inferior cerebellar arteries: Normal. --Posterior inferior cerebellar arteries: Normal. Venous sinuses: As permitted by contrast timing, patent. Anatomic variants: None Delayed phase:  Not performed. Review of the MIP images confirms the above findings IMPRESSION: 1. Narrowing emergent intracranial large vessel occlusion. 2. 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 3. Otherwise, no hemodynamically significant stenosis of the major cervical arteries. Electronically Signed: By: Ulyses Jarred M.D. On: 08/10/2016 00:04   Ct Angio Neck W And/or Wo Contrast  Addendum Date: 08/10/2016   ADDENDUM REPORT: 08/10/2016 03:59 ADDENDUM: There is a transcription error in the 1st impression point. This should read, "No emergent large vessel occlusion." Electronically Signed   By: Ulyses Jarred M.D.   On: 08/10/2016 03:59   Result Date: 08/10/2016 CLINICAL DATA:  Left-sided weakness EXAM: CT ANGIOGRAPHY HEAD AND NECK TECHNIQUE: Multidetector CT imaging of the head and neck was performed using the standard protocol during bolus administration of intravenous contrast. Multiplanar CT image reconstructions and MIPs were obtained to evaluate the vascular anatomy. Carotid stenosis measurements (when applicable) are obtained utilizing NASCET criteria, using the distal internal carotid diameter as the denominator. CONTRAST:  50 mL Isovue 370 COMPARISON:  Head CT 08/09/2016 FINDINGS: CTA NECK FINDINGS Aortic arch: There is no aneurysm or dissection of the visualized ascending aorta or aortic arch. There is a normal 3 vessel branching pattern. The visualized proximal subclavian arteries are normal. Right carotid system: The right common carotid origin is widely patent. There is no common carotid or internal carotid artery dissection or aneurysm. No hemodynamically significant stenosis. Mild atherosclerotic calcification of the proximal internal carotid artery. Or Left carotid system: The left common carotid origin is widely patent. There is no common carotid or internal carotid artery dissection or aneurysm. No hemodynamically significant stenosis. Vertebral arteries:  The vertebral system is codominant. There is severe narrowing of the left vertebral artery origin due to atherosclerotic calcification. Both vertebral arteries are otherwise normal to their confluence with the basilar artery. Skeleton: There is no bony spinal canal stenosis. No lytic or blastic lesions. Other neck: The nasopharynx is clear. The oropharynx and hypopharynx are normal. The epiglottis is normal. The supraglottic larynx, glottis and subglottic larynx are normal. No retropharyngeal collection. The parapharyngeal spaces are preserved. The parotid and submandibular glands are normal. No sialolithiasis or salivary ductal dilatation. The thyroid gland is normal. There is no cervical lymphadenopathy. Upper chest: No pneumothorax or pleural effusion. No nodules or masses. Review of the MIP images confirms the above findings CTA HEAD FINDINGS Anterior circulation: --Intracranial internal carotid arteries: Normal. --Anterior cerebral arteries: Normal. --Middle cerebral arteries: Normal. --Posterior communicating arteries: Diminutive bilaterally Posterior circulation: --Posterior cerebral arteries: Normal. --Superior cerebellar arteries: Normal. --Basilar artery: Normal. --Anterior inferior cerebellar arteries: Normal. --Posterior inferior cerebellar arteries: Normal. Venous sinuses: As permitted by contrast timing, patent. Anatomic variants: None Delayed phase: Not performed. Review of the MIP images confirms the above findings IMPRESSION: 1. Narrowing emergent intracranial large vessel occlusion. 2. 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 3. Otherwise, no hemodynamically significant stenosis of the major cervical arteries. Electronically  Signed: By: Ulyses Jarred M.D. On: 08/10/2016 00:04   Mr Brain Wo Contrast  Result Date: 08/10/2016 CLINICAL DATA:  Acute left-sided weakness EXAM: MRI HEAD WITHOUT CONTRAST MRA HEAD WITHOUT CONTRAST TECHNIQUE: Multiplanar,  multiecho pulse sequences of the brain and surrounding structures were obtained without intravenous contrast. Angiographic images of the head were obtained using MRA technique without contrast. COMPARISON:  CTA head and neck 08/09/2016 FINDINGS: MRI HEAD FINDINGS Brain: The midline structures are normal. There is focal diffusion restriction within the right caudate tail of and extending inferiorly to involve the posterior limb of the right internal capsule. There is no hemorrhage. No mass effect. There are additional punctate foci of diffusion restriction within the left parietal lobe and right occipital lobe. There is beginning confluent hyperintense T2-weighted signal within the periventricular white matter, most often seen in the setting of chronic microvascular ischemia. No mass lesion. No chronic microhemorrhage or cerebral amyloid angiopathy. No hydrocephalus, age advanced atrophy or lobar predominant volume loss. No dural abnormality or extra-axial collection. Skull and upper cervical spine: The visualized skull base, calvarium, upper cervical spine and extracranial soft tissues are normal. Sinuses/Orbits: No fluid levels or advanced mucosal thickening. No mastoid effusion. Normal orbits. MRA HEAD FINDINGS Intracranial internal carotid arteries: Normal. Anterior cerebral arteries: Normal. Middle cerebral arteries: Normal. Posterior communicating arteries: Present bilaterally. Posterior cerebral arteries: Normal. Basilar artery: Normal. Vertebral arteries: Left dominant. Normal. Superior cerebellar arteries: Normal. Anterior inferior cerebellar arteries: Not clearly seen, which is not uncommon. Posterior inferior cerebellar arteries: Normal. IMPRESSION: 1. Acute ischemia of the right caudate tail extending to the posterior limb of the right internal capsule, in keeping with acute left-sided weakness. 2. Punctate foci of acute ischemia within the left parietal lobe and at the right temporal occipital junction.  The distribution of lesions suggests a central cardioembolic process. 3. No hemorrhage or mass effect. 4. Chronic microvascular ischemia. 5. Normal intracranial MRA. Electronically Signed   By: Ulyses Jarred M.D.   On: 08/10/2016 04:10   Mr Jodene Nam Head/brain FI Cm  Result Date: 08/10/2016 CLINICAL DATA:  Acute left-sided weakness EXAM: MRI HEAD WITHOUT CONTRAST MRA HEAD WITHOUT CONTRAST TECHNIQUE: Multiplanar, multiecho pulse sequences of the brain and surrounding structures were obtained without intravenous contrast. Angiographic images of the head were obtained using MRA technique without contrast. COMPARISON:  CTA head and neck 08/09/2016 FINDINGS: MRI HEAD FINDINGS Brain: The midline structures are normal. There is focal diffusion restriction within the right caudate tail of and extending inferiorly to involve the posterior limb of the right internal capsule. There is no hemorrhage. No mass effect. There are additional punctate foci of diffusion restriction within the left parietal lobe and right occipital lobe. There is beginning confluent hyperintense T2-weighted signal within the periventricular white matter, most often seen in the setting of chronic microvascular ischemia. No mass lesion. No chronic microhemorrhage or cerebral amyloid angiopathy. No hydrocephalus, age advanced atrophy or lobar predominant volume loss. No dural abnormality or extra-axial collection. Skull and upper cervical spine: The visualized skull base, calvarium, upper cervical spine and extracranial soft tissues are normal. Sinuses/Orbits: No fluid levels or advanced mucosal thickening. No mastoid effusion. Normal orbits. MRA HEAD FINDINGS Intracranial internal carotid arteries: Normal. Anterior cerebral arteries: Normal. Middle cerebral arteries: Normal. Posterior communicating arteries: Present bilaterally. Posterior cerebral arteries: Normal. Basilar artery: Normal. Vertebral arteries: Left dominant. Normal. Superior cerebellar  arteries: Normal. Anterior inferior cerebellar arteries: Not clearly seen, which is not uncommon. Posterior inferior cerebellar arteries: Normal. IMPRESSION: 1. Acute ischemia of  the right caudate tail extending to the posterior limb of the right internal capsule, in keeping with acute left-sided weakness. 2. Punctate foci of acute ischemia within the left parietal lobe and at the right temporal occipital junction. The distribution of lesions suggests a central cardioembolic process. 3. No hemorrhage or mass effect. 4. Chronic microvascular ischemia. 5. Normal intracranial MRA. Electronically Signed   By: Ulyses Jarred M.D.   On: 08/10/2016 04:10   Ct Head Code Stroke W/o Cm  Addendum Date: 08/10/2016   ADDENDUM REPORT: 08/10/2016 00:13 ADDENDUM: These results were called by telephone at the time of interpretation on 08/10/2016 at 11:58 p.m. to Dr. Kerney Elbe, who verbally acknowledged these results. Electronically Signed   By: Ulyses Jarred M.D.   On: 08/10/2016 00:13   Result Date: 08/10/2016 CLINICAL DATA:  Code stroke.  Left-sided weakness EXAM: CT HEAD WITHOUT CONTRAST TECHNIQUE: Contiguous axial images were obtained from the base of the skull through the vertex without intravenous contrast. COMPARISON:  Head CT 05/14/2015 FINDINGS: Brain: No mass lesion, intraparenchymal hemorrhage or extra-axial collection. No evidence of acute cortical infarct. There is periventricular hypoattenuation compatible with chronic microvascular disease. Vascular: No hyperdense vessel or unexpected calcification. Skull: Normal visualized skull base, calvarium and extracranial soft tissues. Sinuses/Orbits: No sinus fluid levels or advanced mucosal thickening. No mastoid effusion. Normal orbits. ASPECTS [facility] Stroke Program Early CT Score) - Ganglionic level infarction (caudate, lentiform nuclei, internal capsule, insula, M1-M3 cortex): 7 - Supraganglionic infarction (M4-M6 cortex): 3 Total score (0-10 with 10 being  normal): 10 IMPRESSION: 1. 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 2. ASPECTS is 10. Electronically Signed: By: Ulyses Jarred M.D. On: 08/09/2016 23:47    Microbiology: No results found for this or any previous visit (from the past 240 hour(s)).   Labs: Basic Metabolic Panel:  Recent Labs Lab 08/09/16 2322 08/09/16 2327 08/11/16 1954 08/14/16 0447  NA 138 140  --  140  K 3.5 3.5  --  3.9  CL 108 106  --  107  CO2 24  --   --  27  GLUCOSE 104* 101*  --  94  BUN 14 17  --  13  CREATININE 1.10 1.00 1.10 1.15  CALCIUM 8.6*  --   --  8.5*   Liver Function Tests:  Recent Labs Lab 08/09/16 2322 08/14/16 0447  AST 19 16  ALT 11* 9*  ALKPHOS 83 72  BILITOT 0.5 0.6  PROT 7.0 5.9*  ALBUMIN 3.6 3.1*   No results for input(s): LIPASE, AMYLASE in the last 168 hours. No results for input(s): AMMONIA in the last 168 hours. CBC:  Recent Labs Lab 08/09/16 2322 08/09/16 2327 08/11/16 1954 08/14/16 0447  WBC 8.8  --  7.8 7.0  NEUTROABS 3.8  --   --  3.6  HGB 10.2* 11.9* 9.7* 9.4*  HCT 35.0* 35.0* 33.3* 32.2*  MCV 76.4*  --  75.7* 75.6*  PLT 264  --  243 245   Cardiac Enzymes: No results for input(s): CKTOTAL, CKMB, CKMBINDEX, TROPONINI in the last 168 hours. BNP: BNP (last 3 results) No results for input(s): BNP in the last 8760 hours.  ProBNP (last 3 results) No results for input(s): PROBNP in the last 8760 hours.  CBG:  Recent Labs Lab 08/09/16 2326  GLUCAP 93       Signed:  Nita Sells MD   [facility] 08/14/2016, 3:12 PM

## 2016-08-14 NOTE — Progress Notes (Addendum)
Physical Therapy Note  Patient Details  Name: [name] MRN: [id number] Date of Birth: August 14, 1940 Today's Date: 08/14/2016  [id number], 75 min individual tx Pain: none  Gait in room with RW to/from toilet and sink with close supervision.  Toilet transfer for continent B and B.  Gait to/from gym over level tile with RW, supervision.   neuromuscular re-education via forced use and multimodal cues for alternating reciprocal movement x 4 extremities on NuStep at level 4 x 8 minutes.  Seated reaching activity to promote trunk righting reactions, L/R x 8.  Balance activities standing on wedge x 2 minutes with bil UE support fading to 0UE support and on compliant Airex mat x 1 minute and also for10 x 1 mini squats with bil UE support. During mini squats, pt leaned L consistently without spontaneous righting. Quadruped> tall kneeling> R/L leg lifts x 15 seconds each. Pt unable to balance in "bird dog", (2 point) position.   Terminal hip extensons x 10 from tall kneeling. Seated trunk flexion/ext while bouncing 3 KG ball, x 10.  Standing biased to L, during L hand manipulation activity, focusing on trunk rightng .Pt over-focused on L hand activity with poor awareness of LOB L.  Gait to return to room with out AD, carrying RW in bil hands, mod assist for L foot clearance, upright trunk, fluidity of gait.  Pt left resting in w/c with all needs within reach.  See function navigator for current status.  Richie Vadala 08/14/2016, 7:50 AM

## 2016-08-14 NOTE — Progress Notes (Signed)
Subjective/Complaints:   ROS-  No CP,SOB, N/V/D  Objective: Vital Signs: Blood pressure 140/71, pulse 62, temperature 98.9 F (37.2 C), temperature source Oral, resp. rate 16, height 5' 7"  (1.702 m), weight 78.7 kg (173 lb 6.4 oz), SpO2 97 %. No results found. Results for orders placed or performed during the hospital encounter of 08/11/16 (from the past 72 hour(s))  CBC     Status: Abnormal   Collection Time: 08/11/16  7:54 PM  Result Value Ref Range   WBC 7.8 4.0 - 10.5 K/uL   RBC 4.40 4.22 - 5.81 MIL/uL   Hemoglobin 9.7 (L) 13.0 - 17.0 g/dL   HCT 33.3 (L) 39.0 - 52.0 %   MCV 75.7 (L) 78.0 - 100.0 fL   MCH 22.0 (L) 26.0 - 34.0 pg   MCHC 29.1 (L) 30.0 - 36.0 g/dL   RDW 16.5 (H) 11.5 - 15.5 %   Platelets 243 150 - 400 K/uL  Creatinine, serum     Status: None   Collection Time: 08/11/16  7:54 PM  Result Value Ref Range   Creatinine, Ser 1.10 0.61 - 1.24 mg/dL   GFR calc non Af Amer >60 >60 mL/min   GFR calc Af Amer >60 >60 mL/min    Comment: (NOTE) The eGFR has been calculated using the CKD EPI equation. This calculation has not been validated in all clinical situations. eGFR's persistently <60 mL/min signify possible Chronic Kidney Disease.   CBC WITH DIFFERENTIAL     Status: Abnormal   Collection Time: 08/14/16  4:47 AM  Result Value Ref Range   WBC 7.0 4.0 - 10.5 K/uL   RBC 4.26 4.22 - 5.81 MIL/uL   Hemoglobin 9.4 (L) 13.0 - 17.0 g/dL   HCT 32.2 (L) 39.0 - 52.0 %   MCV 75.6 (L) 78.0 - 100.0 fL   MCH 22.1 (L) 26.0 - 34.0 pg   MCHC 29.2 (L) 30.0 - 36.0 g/dL   RDW 16.5 (H) 11.5 - 15.5 %   Platelets 245 150 - 400 K/uL   Neutrophils Relative % 51 %   Neutro Abs 3.6 1.7 - 7.7 K/uL   Lymphocytes Relative 29 %   Lymphs Abs 2.0 0.7 - 4.0 K/uL   Monocytes Relative 15 %   Monocytes Absolute 1.1 (H) 0.1 - 1.0 K/uL   Eosinophils Relative 4 %   Eosinophils Absolute 0.3 0.0 - 0.7 K/uL   Basophils Relative 1 %   Basophils Absolute 0.1 0.0 - 0.1 K/uL  Comprehensive  metabolic panel     Status: Abnormal   Collection Time: 08/14/16  4:47 AM  Result Value Ref Range   Sodium 140 135 - 145 mmol/L   Potassium 3.9 3.5 - 5.1 mmol/L   Chloride 107 101 - 111 mmol/L   CO2 27 22 - 32 mmol/L   Glucose, Bld 94 65 - 99 mg/dL   BUN 13 6 - 20 mg/dL   Creatinine, Ser 1.15 0.61 - 1.24 mg/dL   Calcium 8.5 (L) 8.9 - 10.3 mg/dL   Total Protein 5.9 (L) 6.5 - 8.1 g/dL   Albumin 3.1 (L) 3.5 - 5.0 g/dL   AST 16 15 - 41 U/L   ALT 9 (L) 17 - 63 U/L   Alkaline Phosphatase 72 38 - 126 U/L   Total Bilirubin 0.6 0.3 - 1.2 mg/dL   GFR calc non Af Amer >60 >60 mL/min   GFR calc Af Amer >60 >60 mL/min    Comment: (NOTE) The eGFR has been calculated  using the CKD EPI equation. This calculation has not been validated in all clinical situations. eGFR's persistently <60 mL/min signify possible Chronic Kidney Disease.    Anion gap 6 5 - 15     HEENT: normal Cardio: RRR and no murmur Resp: RRR and no murmur GI: BS positive and NT, ND Extremity:  No Edema Skin:   Intact Neuro: Alert/Oriented, Cranial Nerve II-XII normal, Normal Sensory, Abnormal Motor 5/5 on R sie , 4+ on Left side and Abnormal FMC Ataxic/ dec FMC Musc/Skel:  Other bilateral healed TKR incision Gen NAD   Assessment/Plan: 1. Functional deficits secondary to Right caudate /PLIC infarct which require 3+ hours per day of interdisciplinary therapy in a comprehensive inpatient rehab setting. Physiatrist is providing close team supervision and 24 hour management of active medical problems listed below. Physiatrist and rehab team continue to assess barriers to discharge/monitor patient progress toward functional and medical goals. FIM: Function - Bathing Position: Shower Body parts bathed by patient: Right arm, Left arm, Chest, Abdomen, Front perineal area, Buttocks, Right upper leg, Left upper leg, Right lower leg, Left lower leg Body parts bathed by helper: Back Assist Level: Supervision or verbal  cues  Function- Upper Body Dressing/Undressing What is the patient wearing?: Pull over shirt/dress Pull over shirt/dress - Perfomed by patient: Thread/unthread left sleeve, Thread/unthread right sleeve, Put head through opening, Pull shirt over trunk Assist Level: Set up Set up : To obtain clothing/put away Function - Lower Body Dressing/Undressing What is the patient wearing?: Underwear, Pants, Socks, Shoes Position: Wheelchair/chair at sink Underwear - Performed by patient: Thread/unthread right underwear leg, Thread/unthread left underwear leg, Pull underwear up/down Pants- Performed by patient: Thread/unthread right pants leg, Thread/unthread left pants leg, Pull pants up/down, Fasten/unfasten pants Socks - Performed by patient: Don/doff right sock, Don/doff left sock Shoes - Performed by patient: Don/doff right shoe, Don/doff left shoe, Fasten right, Fasten left Assist for lower body dressing: More than reasonable time, Supervision or verbal cues, Set up Set up : To obtain clothing/put away  Function - Toileting Toileting steps completed by patient: Adjust clothing prior to toileting, Performs perineal hygiene, Adjust clothing after toileting Toileting Assistive Devices: Grab bar or rail  Function - Air cabin crew transfer assistive device: Elevated toilet seat/BSC over toilet, Walker Assist level to toilet: Touching or steadying assistance (Pt > 75%) Assist level from toilet: Supervision or verbal cues  Function - Chair/bed transfer Chair/bed transfer method: Stand pivot, Squat pivot, Ambulatory Chair/bed transfer assist level: Touching or steadying assistance (Pt > 75%) Chair/bed transfer assistive device: Armrests, Walker Chair/bed transfer details: Verbal cues for technique, Verbal cues for precautions/safety, Verbal cues for safe use of DME/AE, Verbal cues for sequencing  Function - Locomotion: Wheelchair Will patient use wheelchair at discharge?: No Function -  Locomotion: Ambulation Assistive device: No device Max distance: 100 Assist level: Moderate assist (Pt 50 - 74%) Assist level: Moderate assist (Pt 50 - 74%) Assist level: Moderate assist (Pt 50 - 74%) Walk 150 feet activity did not occur: Safety/medical concerns Assist level: Moderate assist (Pt 50 - 74%)  Function - Comprehension Comprehension: Auditory Comprehension assist level: Understands basic 90% of the time/cues < 10% of the time  Function - Expression Expression: Verbal Expression assist level: Expresses basic needs/ideas: With no assist  Function - Social Interaction Social Interaction assist level: Interacts appropriately with others - No medications needed.  Function - Problem Solving Problem solving assist level: Solves basic problems with no assist  Function - Memory Memory assist level:  Recognizes or recalls 90% of the time/requires cueing < 10% of the time Patient normally able to recall (first 3 days only): Current season, Location of own room, Staff names and faces, That he or she is in a hospital  Medical Problem List and Plan: 1.  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  DVT Prophylaxis/Anticoagulation: Subcutaneous Lovenox.Normal plt  Venous Doppler studies negative 3. Pain Management: Ultram as needed 4. Mood: Provide emotional support 5. Neuropsych: This patient is capable of making decisions on his own behalf. 6. Skin/Wound Care: Routine skin checks 7. Fluids/Electrolytes/Nutrition: Routine I&O's with follow-up chemistries 8. Rheumatoid arthritis.Arava 20 mg daily, chronic prednisone 5 mg daily, no current joint pain or swelling, S/P B TKR, pt state hands and feet have been mainly affected in past 9. Hyperlipidemia. Lipitor- pt c/o of taking another pill but discussed this med was rec by neuro 10. History of diverticulosis with recurrent GI bleed  status post partial colectomy in the past.Short bowel syndrome trial low residue diet 12.  Microcytic Anemia hx of Fe def recheck Fe studies, may need supplementation 13.  Hypoalbuminemia - 3.1 , mild, Prostat LOS (Days) 3 A FACE TO FACE EVALUATION WAS PERFORMED  Sully Dyment E 08/14/2016, 7:58 AM

## 2016-08-14 NOTE — IPOC Note (Addendum)
Overall Plan of Care [facility]) Patient Details Name: Adnan F Martinique MRN: [id number] DOB: [birth year]  Admitting Diagnosis: CVA  Hospital Problems: Principal Problem:   Gait disturbance, post-stroke Active Problems:   Rheumatoid arthritis ([place])   Left middle cerebral artery stroke [facility])   Short bowel syndrome     Functional Problem List: Nursing Bladder, Bowel, Edema, Endurance, Medication Management, Motor, Nutrition, Pain, Perception, Safety, Sensory, Skin Integrity  PT Balance, Safety, Endurance, Motor  OT Balance, Motor, Sensory, Safety  SLP    TR         Basic ADL's: OT Grooming, Bathing, Dressing, Toileting     Advanced  ADL's: OT       Transfers: PT Bed Mobility, Bed to Chair, Car, Manufacturing systems engineer, Metallurgist: PT Stairs, Ambulation     Additional Impairments: OT Fuctional Use of Upper Extremity  SLP        TR      Anticipated Outcomes Item Anticipated Outcome  Self Feeding Mod I  Swallowing      Basic self-care  Mod I  Toileting  Mod I   Bathroom Transfers Mod I  Bowel/Bladder  min assist   Transfers  mod I  Locomotion  mod I household, supervision community  Communication     Cognition     Pain  less<2  Safety/Judgment  min assist.   Therapy Plan: PT Intensity: Minimum of 1-2 x/day ,45 to 90 minutes PT Frequency: 5 out of 7 days PT Duration Estimated Length of Stay: 12-15 days OT Intensity: Minimum of 1-2 x/day, 45 to 90 minutes OT Frequency: 5 out of 7 days OT Duration/Estimated Length of Stay: 7-10 days         Team Interventions: Nursing Interventions Patient/Family Education, Bladder Management, Bowel Management, Disease Management/Prevention, Pain Management, Medication Management, Skin Care/Wound Management, Cognitive Remediation/Compensation, Discharge Planning, Psychosocial Support  PT interventions Ambulation/gait training, Community reintegration, DME/adaptive equipment instruction, Neuromuscular  re-education, Psychosocial support, Stair training, UE/LE Strength taining/ROM, UE/LE Coordination activities, Therapeutic Activities, Discharge planning, Training and development officer, Disease management/prevention, Functional mobility training, Patient/family education, Splinting/orthotics, Therapeutic Exercise  OT Interventions Balance/vestibular training, Discharge planning, DME/adaptive equipment instruction, Functional mobility training, Therapeutic Activities, UE/LE Strength taining/ROM, UE/LE Coordination activities, Therapeutic Exercise, Self Care/advanced ADL retraining, Patient/family education  SLP Interventions    TR Interventions    SW/CM Interventions  Psychsocial Assessment, Pt/Family Education & Discharge Planning    Team Discharge Planning: Destination: PT-Home ,OT- Home , SLP-  Projected Follow-up: PT-Outpatient PT, OT-  None, SLP-  Projected Equipment Needs: PT-To be determined, OT- To be determined, SLP-  Equipment Details: PT- , OT-  Patient/family involved in discharge planning: PT- Patient, Family member/caregiver,  OT-Patient, Family member/caregiver, SLP-   MD ELOS: 7d Medical Rehab Prognosis:  Good Assessment:  HPI: AADVIK ROKER a 76 y.o.right handed malewith history of diverticulosis with recurrent GI bleed status post partial colectomy,prostate cancer,rheumatoid arthritis on chronic low-dose prednisone, bilateral TKA, 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 Per chart review patient lives with spouse independent prior to admission . Two-level home with bedroom upstairs. Presented 08/10/2016 with left-sided weakness of acute onset resulting in a fall with no loss of consciousness. Blood pressure mildly elevated 170/100. CT/MRI showed acute ischemia of the right caudate tail extending to the posterior limb of the right internal capsule. Punctate foci of acute ischemia within the left parietal lobe and at the right temporal occipital junction. MRA was unremarkable. Patient did  not receive TPA. CT angiogram head and neck showed narrowing  emergent intracranial large vessel occlusion. 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 Echocardiogram with EF 32% grade 1 diastolic dysfunction.No current plan for TEE secondary significant GI history.   Now requiring 24/7 Rehab RN,MD, as well as CIR level PT, OT and SLP.  Treatment team will focus on ADLs and mobility with goals set at Mod I See Team Conference Notes for weekly updates to the plan of care

## 2016-08-14 NOTE — Progress Notes (Signed)
Occupational Therapy Session Note  Patient Details  Name: [name] MRN: [id number] Date of Birth: Feb 23, 1941  Today's Date: 08/14/2016 OT Individual Time: 1330-1500 OT Individual Time Calculation (min): 90 min    Short Term Goals: Week 1:  OT Short Term Goal 1 (Week 1): STG=LTG d/t short LOS  Skilled Therapeutic Interventions/Progress Updates:    Pt seen for OT ADL bathing/dressing session and for neuro re-ed. Pt sitting EOB upon arrival, attempting to ambulate to bathroom with wife's assistance. Following education regarding hospital policy and need for staff assist, OT check off pt's wife to assist him to bathroom. He ambulated throughout room with RW and supervision, VCs for hand placement on RW during sit <> stands. He bathed seated on tub bench with supervision/ set-up assist. He dressed seated on toilet with increased time and supervision for standing portions. With RW, he ambulated to ADL apartment. Completed simulated bath tub transfer as he likes to take baths at home. Transfer completed with CGA, pt with home set-up of grab bars etc for increased safety. In therapy gym, pt completed floor transfer in simulation of fall. Educated regarding need to call 911 if injury is suspected. Pt completed floor transfer with supervision and VCs for technique.  He completed standing pipe tree activity with R LE supported on block step to facilitate weightbearing through L LE. Pt tolerated x2 trials of ~5 minutes each to complete activity with seated rest break provided btwn trials. Following seated rest break, he ambulated back to room with HHA and min-mod A, VCs for L foot clearance when fatigued. Recommend pt cont to use RW for increased safety and independence.  Educated throughout session regarding home modifications to reduce fall risk and d/c planning.   Therapy Documentation Precautions:  Precautions Precautions: Fall Precaution Comments: Mild L hemiparesis, LLE fatigues with  ambulation Restrictions Weight Bearing Restrictions: No Pain:   No/ denies pain ADL: ADL ADL Comments: see Functional Assessment Tool  See Function Navigator for Current Functional Status.   Therapy/Group: Individual Therapy  Lewis, Trinitee Horgan C 08/14/2016, 7:25 AM

## 2016-08-15 ENCOUNTER — Inpatient Hospital Stay (HOSPITAL_COMMUNITY): Payer: PPO | Admitting: Physical Therapy

## 2016-08-15 ENCOUNTER — Inpatient Hospital Stay (HOSPITAL_COMMUNITY): Payer: PPO | Admitting: Occupational Therapy

## 2016-08-15 ENCOUNTER — Inpatient Hospital Stay (HOSPITAL_COMMUNITY): Payer: PPO | Admitting: Speech Pathology

## 2016-08-15 MED ORDER — FERROUS SULFATE 325 (65 FE) MG PO TABS
325.0000 mg | ORAL_TABLET | Freq: Two times a day (BID) | ORAL | Status: DC
  Administered 2016-08-15 – 2016-08-17 (×5): 325 mg via ORAL
  Filled 2016-08-15 (×5): qty 1

## 2016-08-15 NOTE — Progress Notes (Signed)
Subjective/Complaints: Feels too restricted by having to call as well as bed and WC alarms  ROS-  No CP,SOB, N/V/D  Objective: Vital Signs: Blood pressure (!) 142/60, pulse 63, temperature 98.5 F (36.9 C), temperature source Oral, resp. rate 16, height 5' 7"  (1.702 m), weight 78.7 kg (173 lb 6.4 oz), SpO2 98 %. No results found. Results for orders placed or performed during the hospital encounter of 08/11/16 (from the past 72 hour(s))  CBC WITH DIFFERENTIAL     Status: Abnormal   Collection Time: 08/14/16  4:47 AM  Result Value Ref Range   WBC 7.0 4.0 - 10.5 K/uL   RBC 4.26 4.22 - 5.81 MIL/uL   Hemoglobin 9.4 (L) 13.0 - 17.0 g/dL   HCT 32.2 (L) 39.0 - 52.0 %   MCV 75.6 (L) 78.0 - 100.0 fL   MCH 22.1 (L) 26.0 - 34.0 pg   MCHC 29.2 (L) 30.0 - 36.0 g/dL   RDW 16.5 (H) 11.5 - 15.5 %   Platelets 245 150 - 400 K/uL   Neutrophils Relative % 51 %   Neutro Abs 3.6 1.7 - 7.7 K/uL   Lymphocytes Relative 29 %   Lymphs Abs 2.0 0.7 - 4.0 K/uL   Monocytes Relative 15 %   Monocytes Absolute 1.1 (H) 0.1 - 1.0 K/uL   Eosinophils Relative 4 %   Eosinophils Absolute 0.3 0.0 - 0.7 K/uL   Basophils Relative 1 %   Basophils Absolute 0.1 0.0 - 0.1 K/uL  Comprehensive metabolic panel     Status: Abnormal   Collection Time: 08/14/16  4:47 AM  Result Value Ref Range   Sodium 140 135 - 145 mmol/L   Potassium 3.9 3.5 - 5.1 mmol/L   Chloride 107 101 - 111 mmol/L   CO2 27 22 - 32 mmol/L   Glucose, Bld 94 65 - 99 mg/dL   BUN 13 6 - 20 mg/dL   Creatinine, Ser 1.15 0.61 - 1.24 mg/dL   Calcium 8.5 (L) 8.9 - 10.3 mg/dL   Total Protein 5.9 (L) 6.5 - 8.1 g/dL   Albumin 3.1 (L) 3.5 - 5.0 g/dL   AST 16 15 - 41 U/L   ALT 9 (L) 17 - 63 U/L   Alkaline Phosphatase 72 38 - 126 U/L   Total Bilirubin 0.6 0.3 - 1.2 mg/dL   GFR calc non Af Amer >60 >60 mL/min   GFR calc Af Amer >60 >60 mL/min    Comment: (NOTE) The eGFR has been calculated using the CKD EPI equation. This calculation has not been validated  in all clinical situations. eGFR's persistently <60 mL/min signify possible Chronic Kidney Disease.    Anion gap 6 5 - 15  Iron and TIBC     Status: Abnormal   Collection Time: 08/14/16 12:21 PM  Result Value Ref Range   Iron 10 (L) 45 - 182 ug/dL   TIBC 360 250 - 450 ug/dL   Saturation Ratios 3 (L) 17.9 - 39.5 %   UIBC 350 ug/dL     HEENT: normal Cardio: RRR and no murmur Resp: RRR and no murmur GI: BS positive and NT, ND Extremity:  No Edema Skin:   Intact Neuro: Alert/Oriented, Cranial Nerve II-XII normal, Normal Sensory, Abnormal Motor 5/5 on R sie , 4+ on Left side and Abnormal FMC Ataxic/ dec FMC Musc/Skel:  Other bilateral healed TKR incision Gen NAD   Assessment/Plan: 1. Functional deficits secondary to Right caudate /PLIC infarct which require 3+ hours per day of  interdisciplinary therapy in a comprehensive inpatient rehab setting. Physiatrist is providing close team supervision and 24 hour management of active medical problems listed below. Physiatrist and rehab team continue to assess barriers to discharge/monitor patient progress toward functional and medical goals. FIM: Function - Bathing Position: Shower Body parts bathed by patient: Right arm, Left arm, Chest, Abdomen, Front perineal area, Buttocks, Right upper leg, Left upper leg, Right lower leg, Left lower leg, Back Body parts bathed by helper: Back Assist Level: Supervision or verbal cues  Function- Upper Body Dressing/Undressing What is the patient wearing?: Pull over shirt/dress Pull over shirt/dress - Perfomed by patient: Thread/unthread left sleeve, Thread/unthread right sleeve, Put head through opening, Pull shirt over trunk Assist Level: Set up Set up : To obtain clothing/put away Function - Lower Body Dressing/Undressing What is the patient wearing?: Underwear, Pants, Socks, Shoes Position: Other (comment) (Sitting on toilet) Underwear - Performed by patient: Thread/unthread right underwear leg,  Thread/unthread left underwear leg, Pull underwear up/down Pants- Performed by patient: Thread/unthread right pants leg, Thread/unthread left pants leg, Pull pants up/down, Fasten/unfasten pants Socks - Performed by patient: Don/doff right sock, Don/doff left sock Shoes - Performed by patient: Don/doff right shoe, Don/doff left shoe Assist for footwear: Independent Assist for lower body dressing: Supervision or verbal cues Set up : To obtain clothing/put away  Function - Toileting Toileting steps completed by patient: Adjust clothing prior to toileting, Performs perineal hygiene, Adjust clothing after toileting Toileting Assistive Devices: Grab bar or rail Assist level: Supervision or verbal cues  Function - Air cabin crew transfer assistive device: Walker Assist level to toilet: Supervision or verbal cues Assist level from toilet: Supervision or verbal cues  Function - Chair/bed transfer Chair/bed transfer method: Stand pivot Chair/bed transfer assist level: Supervision or verbal cues Chair/bed transfer assistive device: Walker Chair/bed transfer details: Verbal cues for technique, Verbal cues for precautions/safety, Verbal cues for safe use of DME/AE, Verbal cues for sequencing  Function - Locomotion: Wheelchair Will patient use wheelchair at discharge?: No Function - Locomotion: Ambulation Assistive device: No device Max distance: 100 Assist level: Touching or steadying assistance (Pt > 75%) Assist level: Touching or steadying assistance (Pt > 75%) Assist level: Touching or steadying assistance (Pt > 75%) Walk 150 feet activity did not occur: Safety/medical concerns Assist level: Moderate assist (Pt 50 - 74%)  Function - Comprehension Comprehension: Auditory Comprehension assist level: Understands basic 90% of the time/cues < 10% of the time  Function - Expression Expression: Verbal Expression assist level: Expresses basic needs/ideas: With no assist  Function -  Social Interaction Social Interaction assist level: Interacts appropriately with others - No medications needed.  Function - Problem Solving Problem solving assist level: Solves basic problems with no assist  Function - Memory Memory assist level: Recognizes or recalls 90% of the time/requires cueing < 10% of the time Patient normally able to recall (first 3 days only): Current season, Location of own room, Staff names and faces, That he or she is in a hospital  Medical Problem List and Plan: 1.  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  DVT Prophylaxis/Anticoagulation: Eliquis.Normal plt no bleeding noted Venous Doppler studies negative 3. Pain Management: Ultram as needed 4. Mood: Provide emotional support 5. Neuropsych: This patient is capable of making decisions on his own behalf. 6. Skin/Wound Care:  Routine skin checks 7. Fluids/Electrolytes/Nutrition: Routine I&O's with follow-up chemistries 8. Rheumatoid arthritis.Arava 20 mg daily, chronic prednisone 5 mg daily, no current joint pain or swelling, S/P B TKR, pt state hands and feet have been mainly affected in past 9. Hyperlipidemia. Lipitor- pt c/o of taking another pill but discussed this med was rec by neuro 10. 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  Microcytic Anemia hx of Fe def recheck Fe studies, may need supplementation, add Fe 13.  Hypoalbuminemia - 3.1 , mild, Prostat LOS (Days) 4 A FACE TO FACE EVALUATION WAS PERFORMED  Angelette Ganus E 08/15/2016, 8:18 AM

## 2016-08-15 NOTE — Progress Notes (Addendum)
Social Work Patient ID: Swanson F Martinique, male   DOB: 13-Apr-1941, 76 y.o.   MRN: [id number] Team feels pt will reach his goals by Thursday and MD is agreeable to this plan. Have informed pt and wife about this plan both are agreeable. Will work on discharge needs. Has DME only need is OPPT and will arrange through [facility].

## 2016-08-15 NOTE — Progress Notes (Signed)
Occupational Therapy Session Note  Patient Details  Name: [name] MRN: [id number] Date of Birth: 1941-02-11  Today's Date: 08/15/2016 OT Individual Time: 1300-1400 OT Individual Time Calculation (min): 60 min    Short Term Goals: Week 1:  OT Short Term Goal 1 (Week 1): STG=LTG d/t short LOS  Skilled Therapeutic Interventions/Progress Updates:    Pt seen for OT session focusing on functional activity tolerance and balance. Pt in supine upon arrival, declining need for bathing/dressing this afternoon. He ambulated to therapy gym without AD with steadying assist.  He completed  5 Minutes on level 5 utilizing NuStep for UE/LE strengthening and endurance.  He completed golf putting activity in standing with close supervision, he picked up golf balls with steadying assist. Completed x2 trials.  He then competed Wii bowling activity in standing focusing on weight shift and dynamic balance- completed with supervision. Second trial of bowling completed with pt standing on non-compliant foam mat. He required min A to step onto mat each trial. Pt with 1 LOB episode to L requiring mod A to regain balance. Pt cont to have decreased awareness of his high fall risk.  Pt ambulated back to room at end of session using RW, another LOB episode to L when distracted during ambulation, pt able to self correct. Pt left in supine at end of session, all needs in reach with wife present. Throughout session, discussed fall risk, DME, continuum of care, follow up therapy and d/c planning.   Therapy Documentation Precautions:  Precautions Precautions: Fall Precaution Comments: Mild L hemiparesis, LLE fatigues with ambulation Restrictions Weight Bearing Restrictions: No Pain:   No/ denies pain ADL: ADL ADL Comments: see Functional Assessment Tool  See Function Navigator for Current Functional Status.   Therapy/Group: Individual Therapy  Lewis, Colbe Viviano C 08/15/2016, 7:12 AM

## 2016-08-15 NOTE — Progress Notes (Signed)
Physical Therapy Session Note  Patient Details  Name: [name] MRN: [id number] Date of Birth: [birth year]  Today's Date: 08/15/2016 PT Individual Time: 1415-1500 PT Individual Time Calculation (min): 45 min   Short Term Goals: Week 1:  PT Short Term Goal 1 (Week 1): pt will ambulate with LRAD and min assist PT Short Term Goal 2 (Week 1): pt will increase Berg score to >32/56  Skilled Therapeutic Interventions/Progress Updates:    no c/o pain.  Pt requesting to go outside for session.  Ambulation in community session with RW and distant supervision >1000', on/off elevator, on uneven surfaces, up/down incline.  Outside pt engaged in d/c planning discussion with wife present regarding home set up, use of AD at home, progression with outpatient therapy, and stroke recovery.  Pt requesting to remain outside with wife at end of session, transferred to w/c with supervision and PT returned RW to pt's room and alerted RN to pt being outside.    Therapy Documentation Precautions:  Precautions Precautions: Fall Precaution Comments: Mild L hemiparesis, LLE fatigues with ambulation Restrictions Weight Bearing Restrictions: No   See Function Navigator for Current Functional Status.   Therapy/Group: Individual Therapy  Earnest Conroy Penven-Crew 08/15/2016, 4:56 PM

## 2016-08-15 NOTE — Progress Notes (Signed)
Physical Therapy Session Note  Patient Details  Name: [name] MRN: [id number] Date of Birth: 06-15-1940  Today's Date: 08/15/2016 PT Individual Time: 0900-1000 and 1505-1530 PT Individual Time Calculation (min): 60 min and 25 min  Short Term Goals: Week 1:  PT Short Term Goal 1 (Week 1): pt will ambulate with LRAD and min assist PT Short Term Goal 2 (Week 1): pt will increase Berg score to >32/56  Skilled Therapeutic Interventions/Progress Updates: Pt presented in w/c agreeable to therapy with wife present. Ambulated to rehab gym with RW with occasional LLE shuffling. NuStep L5 x 10 min. Performed PNF D1/D2 in high kneeling with 3kg weighted ball with pt able to maintain midline throughout. Transition high kneeling to quadraped x 10 for coordination and increased wt bearing through [place].  Performed trunk flexion/ext with 1Kg weighted ball increasing dynamic balance challenges by throwing in different directions. Use of rebounder with 1Kg weighted ball with pt placing 1 for on 4in step. Pt demonstrated no LOB with all activities.  Trailed Biodex random control L11 with use of BUE, pt able to tolerate for approx 5 min with 70% accuracy. Pt demonstrated increased hip/ankle strategy. Returned to room no AD with HHA with pt demonstrating no LOB and improving cadence. Pt left in w/c at end of session refusing chair alarm, however wife present and signed off to assist with bathroom transfers if needed.   Tx2: Pt outside with wife. Agreeable to attempt ambulation outside pushing RW. Pt ambulated approx 150 ft taking x 2 standing rest breaks on uneven surface pushing w/c. Pt required mod cues for maintaining erect posture and L foot clearance. Demonstrating good safety awareness when crossing outside thresholds. Pt was min guard with outside ambulation. Pt then requesting to return to unit in w/c due to increased fatigue. Pt agreeable to propel back to room using BLE for strengthening and endurance. Pt  required x 3 rest breaks due to LLE fatigue. Pt returned to room and remained in w/c with all needs met.      Therapy Documentation Precautions:  Precautions Precautions: Fall Precaution Comments: Mild L hemiparesis, LLE fatigues with ambulation Restrictions Weight Bearing Restrictions: No General:   Vital Signs:  Pain: Pain Assessment Pain Assessment: No/denies pain Pain Score: 0-No pain   See Function Navigator for Current Functional Status.   Therapy/Group: Individual Therapy  [place]  Breea Loncar, PTA  08/15/2016, 12:32 PM

## 2016-08-16 ENCOUNTER — Inpatient Hospital Stay (HOSPITAL_COMMUNITY): Payer: PPO | Admitting: Occupational Therapy

## 2016-08-16 ENCOUNTER — Inpatient Hospital Stay (HOSPITAL_COMMUNITY): Payer: PPO | Admitting: Physical Therapy

## 2016-08-16 ENCOUNTER — Inpatient Hospital Stay (HOSPITAL_COMMUNITY): Payer: PPO

## 2016-08-16 DIAGNOSIS — I639 Cerebral infarction, unspecified: Secondary | ICD-10-CM | POA: Diagnosis present

## 2016-08-16 NOTE — Progress Notes (Signed)
Social Work Patient ID: [name], male   DOB: 02/09/1941, 76 y.o.   MRN: [id number]  Met with pt and wife to discuss team conference and goals he has reached for discharge tomorrow. He is feeling good Today but the therapy team is pushing him hard today on his last day of therapies. Has all equipment and have set up OPPT. Ready for discharge tomorrow.

## 2016-08-16 NOTE — Progress Notes (Signed)
Occupational Therapy Discharge Summary  Patient Details  Name: [name] MRN: [id number] Date of Birth: 10/11/1940  Patient has met 8 of 8 long term goals due to improved activity tolerance, improved balance, postural control, functional use of  LEFT upper extremity and improved coordination.  Patient to discharge at overall Modified Independent level.  Patient's care partner is independent to provide the necessary physical assistance at discharge.  Pt's wife present throughout IPR stay and aware of pt's deficits. Pt d/c at overall mod I level, however, requires occasional cuing for RW management in functional context. Pt's wife able to provide appropriate supervision as needed. Instructed pt to use RW until OPPT progresses pt to LRAD.   Recommendation:  Patient with no further OT needs. OPPT to address dynamic standing balance/endurance and ambulation deficits.   Equipment: No equipment provided  Reasons for discharge: treatment goals met and discharge from hospital  Patient/family agrees with progress made and goals achieved: Yes  OT Discharge Precautions/Restrictions  Precautions Precautions: Fall Precaution Comments: Mild L hemiparesis, LLE fatigues with ambulation Restrictions Weight Bearing Restrictions: No ADL ADL ADL Comments: see Functional Assessment Tool Vision/Perception    Wears glasses at all times; no change from baseline Cognition Overall Cognitive Status: Within Functional Limits for tasks assessed Arousal/Alertness: Awake/alert Orientation Level: Oriented X4 Attention: Sustained Sustained Attention: Appears intact Memory: Appears intact Awareness: Appears intact Problem Solving: Appears intact Safety/Judgment: Appears intact Sensation Sensation Light Touch: Appears Intact Stereognosis: Appears Intact Hot/Cold: Appears Intact Proprioception: Appears Intact Coordination Gross Motor Movements are Fluid and Coordinated: Yes Fine Motor Movements are  Fluid and Coordinated: Yes Coordination and Movement Description: [facility] WFL for ADLs 9 Hole Peg Test: R 23.46, 26.09, and 21.90    L: 33.15, 32.80, and 33.25  Motor  Motor Motor: Hemiplegia Motor - Discharge Observations: mild decreased coordination with the LLE, noted with increased difficulty of tasks and when fatigued.  Trunk/Postural Assessment  Cervical Assessment Cervical Assessment: Within Functional Limits Thoracic Assessment Thoracic Assessment: Within Functional Limits Lumbar Assessment Lumbar Assessment: Within Functional Limits Postural Control Postural Control: Deficits on evaluation Protective Responses: mild delay in standing without UE support.   Balance Balance Balance Assessed: Yes Dynamic Sitting Balance Dynamic Sitting - Balance Support: During functional activity;Feet supported Dynamic Sitting - Level of Assistance: 7: Independent Static Standing Balance Static Standing - Balance Support: During functional activity;No upper extremity supported Static Standing - Level of Assistance: 6: Modified independent (Device/Increase time) Dynamic Standing Balance Dynamic Standing - Balance Support: During functional activity;Right upper extremity supported;Left upper extremity supported Dynamic Standing - Level of Assistance: 6: Modified independent (Device/Increase time) Extremity/Trunk Assessment RUE Assessment RUE Assessment: Within Functional Limits LUE Assessment LUE Assessment: Within Functional Limits   See Function Navigator for Current Functional Status.  Lewis, Marylene Masek C 08/16/2016, 3:26 PM

## 2016-08-16 NOTE — Progress Notes (Signed)
Subjective/Complaints: Seen during OT, was able to ambulate without device while hitting ball with tennis racquet Now ambulating with walker.  Per pt was using cane post op after L4-5 fusion  ROS-  No CP,SOB, N/V/D  Objective: Vital Signs: Blood pressure (!) 148/64, pulse 62, temperature 98 F (36.7 C), temperature source Oral, resp. rate 18, height _0  (1.702 m), weight 78.7 kg (173 lb 6.4 oz), SpO2 97 %. No results found. Results for orders placed or performed during the hospital encounter of 08/11/16 (from the past 72 hour(s))  CBC WITH DIFFERENTIAL     Status: Abnormal   Collection Time: 08/14/16  4:47 AM  Result Value Ref Range   WBC 7.0 4.0 - 10.5 K/uL   RBC 4.26 4.22 - 5.81 MIL/uL   Hemoglobin 9.4 (L) 13.0 - 17.0 g/dL   HCT 32.2 (L) 39.0 - 52.0 %   MCV 75.6 (L) 78.0 - 100.0 fL   MCH 22.1 (L) 26.0 - 34.0 pg   MCHC 29.2 (L) 30.0 - 36.0 g/dL   RDW 16.5 (H) 11.5 - 15.5 %   Platelets 245 150 - 400 K/uL   Neutrophils Relative % 51 %   Neutro Abs 3.6 1.7 - 7.7 K/uL   Lymphocytes Relative 29 %   Lymphs Abs 2.0 0.7 - 4.0 K/uL   Monocytes Relative 15 %   Monocytes Absolute 1.1 (H) 0.1 - 1.0 K/uL   Eosinophils Relative 4 %   Eosinophils Absolute 0.3 0.0 - 0.7 K/uL   Basophils Relative 1 %   Basophils Absolute 0.1 0.0 - 0.1 K/uL  Comprehensive metabolic panel     Status: Abnormal   Collection Time: 08/14/16  4:47 AM  Result Value Ref Range   Sodium 140 135 - 145 mmol/L   Potassium 3.9 3.5 - 5.1 mmol/L   Chloride 107 101 - 111 mmol/L   CO2 27 22 - 32 mmol/L   Glucose, Bld 94 65 - 99 mg/dL   BUN 13 6 - 20 mg/dL   Creatinine, Ser 1.15 0.61 - 1.24 mg/dL   Calcium 8.5 (L) 8.9 - 10.3 mg/dL   Total Protein 5.9 (L) 6.5 - 8.1 g/dL   Albumin 3.1 (L) 3.5 - 5.0 g/dL   AST 16 15 - 41 U/L   ALT 9 (L) 17 - 63 U/L   Alkaline Phosphatase 72 38 - 126 U/L   Total Bilirubin 0.6 0.3 - 1.2 mg/dL   GFR calc non Af Amer >60 >60 mL/min   GFR calc Af Amer >60 >60 mL/min    Comment:  (NOTE) The eGFR has been calculated using the CKD EPI equation. This calculation has not been validated in all clinical situations. eGFR's persistently <60 mL/min signify possible Chronic Kidney Disease.    Anion gap 6 5 - 15  Iron and TIBC     Status: Abnormal   Collection Time: 08/14/16 12:21 PM  Result Value Ref Range   Iron 10 (L) 45 - 182 ug/dL   TIBC 360 250 - 450 ug/dL   Saturation Ratios 3 (L) 17.9 - 39.5 %   UIBC 350 ug/dL     HEENT: normal Cardio: RRR and no murmur Resp: RRR and no murmur GI: BS positive and NT, ND Extremity:  No Edema Skin:   Intact Neuro: Alert/Oriented, Cranial Nerve II-XII normal, Normal Sensory, Abnormal Motor 5/5 on R sie , 4+ on Left side and Abnormal FMC Ataxic/ dec FMC Musc/Skel:  Other bilateral healed TKR incision Gen NAD   Assessment/Plan:  1. Functional deficits secondary to Right caudate /PLIC infarct which require 3+ hours per day of interdisciplinary therapy in a comprehensive inpatient rehab setting. Physiatrist is providing close team supervision and 24 hour management of active medical problems listed below. Physiatrist and rehab team continue to assess barriers to discharge/monitor patient progress toward functional and medical goals. FIM: Function - Bathing Position: (P) Shower Body parts bathed by patient: (P) Right arm, Left arm, Chest, Abdomen, Front perineal area, Buttocks, Right upper leg, Left upper leg, Right lower leg, Left lower leg, Back Body parts bathed by helper: Back Assist Level: Supervision or verbal cues  Function- Upper Body Dressing/Undressing What is the patient wearing?: Pull over shirt/dress Pull over shirt/dress - Perfomed by patient: Thread/unthread left sleeve, Thread/unthread right sleeve, Put head through opening, Pull shirt over trunk Assist Level: Set up Set up : To obtain clothing/put away Function - Lower Body Dressing/Undressing What is the patient wearing?: Underwear, Pants, Socks,  Shoes Position: Other (comment) (Sitting on toilet) Underwear - Performed by patient: Thread/unthread right underwear leg, Thread/unthread left underwear leg, Pull underwear up/down Pants- Performed by patient: Thread/unthread right pants leg, Thread/unthread left pants leg, Pull pants up/down, Fasten/unfasten pants Socks - Performed by patient: Don/doff right sock, Don/doff left sock Shoes - Performed by patient: Don/doff right shoe, Don/doff left shoe Assist for footwear: Independent Assist for lower body dressing: Supervision or verbal cues Set up : To obtain clothing/put away  Function - Toileting Toileting steps completed by patient: Adjust clothing prior to toileting, Performs perineal hygiene, Adjust clothing after toileting Toileting Assistive Devices: Grab bar or rail Assist level: Supervision or verbal cues  Function - Air cabin crew transfer assistive device: Walker Assist level to toilet: Supervision or verbal cues Assist level from toilet: Supervision or verbal cues  Function - Chair/bed transfer Chair/bed transfer method: Stand pivot Chair/bed transfer assist level: Supervision or verbal cues Chair/bed transfer assistive device: Walker Chair/bed transfer details: Verbal cues for technique, Verbal cues for precautions/safety, Verbal cues for safe use of DME/AE, Verbal cues for sequencing  Function - Locomotion: Wheelchair Will patient use wheelchair at discharge?: No Function - Locomotion: Ambulation Assistive device: No device Max distance: 100 Assist level: Touching or steadying assistance (Pt > 75%) Assist level: Touching or steadying assistance (Pt > 75%) Assist level: Touching or steadying assistance (Pt > 75%) Walk 150 feet activity did not occur: Safety/medical concerns Assist level: Moderate assist (Pt 50 - 74%)  Function - Comprehension Comprehension: Auditory Comprehension assist level: Understands basic 90% of the time/cues < 10% of the  time  Function - Expression Expression: Verbal Expression assist level: Expresses basic needs/ideas: With no assist  Function - Social Interaction Social Interaction assist level: Interacts appropriately with others - No medications needed.  Function - Problem Solving Problem solving assist level: Solves basic problems with no assist  Function - Memory Memory assist level: Recognizes or recalls 90% of the time/requires cueing < 10% of the time Patient normally able to recall (first 3 days only): Current season, Location of own room, Staff names and faces, That he or she is in a hospital  Medical Problem List and Plan: 1.  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 Formulized individual treatment plan based  on medical history, underlying problem and comorbidities. 2.  DVT Prophylaxis/Anticoagulation: Subcutaneous Lovenox.Normal plt  Venous Doppler studies negative 3. Pain Management: Ultram as needed 4. Mood: Provide emotional support 5. Neuropsych: This patient is capable of making decisions on his own behalf. 6. Skin/Wound Care: Routine skin checks 7. Fluids/Electrolytes/Nutrition: Routine I&O's with follow-up chemistries 8. Rheumatoid arthritis.Arava 20 mg daily, chronic prednisone 5 mg daily, no current joint pain or swelling, S/P B TKR, pt state hands and feet have been mainly affected in past 9. Hyperlipidemia. Lipitor- pt c/o of taking another pill but discussed this med was rec by neuro 10. 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  Microcytic Anemia hx of Fe def recheck Fe studies, may need supplementation 13.  Hypoalbuminemia - 3.1 ,  mild, Prostat LOS (Days) 5 A FACE TO FACE EVALUATION WAS PERFORMED  KIRSTEINS,ANDREW E 08/16/2016, 8:17 AM

## 2016-08-16 NOTE — Discharge Summary (Signed)
NAME:  [name], [name]                ACCOUNT NO.:  [id number]  MEDICAL RECORD NO.:  [id number]  LOCATION:                                 FACILITY:  PHYSICIAN:  Charlett Blake, M.D.DATE OF BIRTH:  1940-06-16  DATE OF ADMISSION:  08/11/2016 DATE OF DISCHARGE:  08/17/2016                              DISCHARGE SUMMARY   DISCHARGE DIAGNOSES: 1. 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 2. Eliquis for deep venous thrombosis prophylaxis. 3. Rheumatoid arthritis. 4. Hyperlipidemia. 5. History of diverticulosis with recurrent gastrointestinal bleed. 6. Microcytic anemia.  HISTORY OF PRESENT ILLNESS:  This is a 76 year old right-handed male, history of diverticulosis; recurrent GI bleed with partial colectomy; prostate cancer; rheumatoid arthritis, on low-dose prednisone; bilateral TKA.  Lives with spouse, independent prior to admission.  Presented August 10, 2016, with left-sided weakness of acute onset resulting in a fall with no loss of consciousness.  Blood pressure mildly elevated, 170/100.  CT-MRI showed acute ischemia of the right caudate tail extending into the posterior limb of the right internal capsule. Punctate foci of acute ischemia within the left parietal lobe and at the right temporal occipital junction.  MRA unremarkable.  The patient did not receive tPA.  CT angiogram of head and neck showed narrowing emergent intracranial large vessel occlusion.  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 Echocardiogram with an ejection fraction of 47%, grade 1 diastolic dysfunction.  No current plan for TEE secondary to history of GI history.  Venous Dopplers lower extremities negative.  Maintained on Eliquis for CVA prophylaxis.  The patient was admitted for comprehensive rehab program.  PAST MEDICAL HISTORY:  See discharge diagnoses.  SOCIAL HISTORY:  Lives with spouse, independent  prior to admission. Functional status upon admission to rehab services was max assist, sit to stand; min-to-mod assist, activities of daily living.  PHYSICAL EXAMINATION:  VITAL SIGNS:  Blood pressure 174/73, pulse 61, temperature 98, and respirations 20. GENERAL:  Alert male, in no acute distress.  Follow commands.  Fair awareness of deficits.  EOMs intact. NECK:  Supple.  Nontender.  No JVD. CARDIAC:  Rate controlled. ABDOMEN:  Soft, nontender.  Good bowel sounds. EXTREMITIES:  Right upper extremity 5/5 deltoid, biceps, triceps, wrist extension; 5/5 hip flexors, knee extension, ankle dorsiflexion, and ankle plantar flexion.  Left upper extremity 4+/5 deltoid, biceps, triceps, wrist extension.  REHABILITATION HOSPITAL COURSE:  The patient was admitted to inpatient rehab services.  The following issues were addressed during the patient's rehabilitation stay.  Pertaining to Mr. Rotan right caudate PLIC left parietal right temporooccipital junction infarct remained stable, maintained on Eliquis.  Venous Doppler studies negative.  No bleeding episodes.  Rheumatoid arthritis, maintained on Arava, chronic prednisone, no current joint pain or swelling.  Blood pressures controlled.  Monitor on no current antihypertensive medication.  He did have a history of diverticulosis, recurrent GI bleed, partial colectomy in the past, monitoring of bowel program.  Microcytic anemia.  History of iron-deficiency, maintained on iron supplement.  The patient received weekly collaborative interdisciplinary team conferences to discuss estimated length of stay, family teaching, any barriers to his discharge.  He was ambulating greater than a 1000 feet rolling walker, on and off elevator, uneven surfaces, up and down inclines.  Outside, he engaged in uneven surfaces, use of assistive device, monitoring of safety.  He could gather his belongings for activities of daily living and homemaking.  It was  discussed no driving, no swallowing difficulties.  He was fully able to communicate his needs.  After family teaching completed, plan was to discharge to home.  DISCHARGE MEDICATIONS: 1. Eliquis 5 mg p.o. b.i.d. 2. Lipitor 40 mg p.o. daily. 3. Ferrous sulfate 325 mg p.o. b.i.d. 4. Arava 20 mg p.o. daily. 5. Protonix 40 mg p.o. daily. 6. Prednisone 5 mg p.o. daily. 7. Ultram 50 mg p.o. every 6 hours as needed for pain.  DIET:  His diet was regular.  He would follow up with Dr. Alysia Penna at the [facility] as advised; Dr. Erlinda Hong, Neurology Services, call for appointment; Dr. Thompson Grayer, Cardiology Services, call for appointment; Dr. Lajean Manes, medical management.     Lauraine Rinne, P.A.   ______________________________ Charlett Blake, M.D.    DA/MEDQ  D:  08/16/2016  T:  08/16/2016  Job:  [id number]  cc:   Charlett Blake, M.D. Hal T. Stoneking, M.D. Dr. Katheran Awe, MD

## 2016-08-16 NOTE — Discharge Summary (Signed)
Discharge summary job [id number]

## 2016-08-16 NOTE — Patient Care Conference (Signed)
Inpatient RehabilitationTeam Conference and Plan of Care Update Date: 08/16/2016   Time: 10:45 AM    Patient Name: [name]      Medical Record Number: [id number]  Date of Birth: 26-Aug-1940 Sex: Male         Room/Bed: 4M08C/4M08C-01 Payor Info: Payor: [employer] / Plan: [employer] / Product Type: *No Product type* /    Admitting Diagnosis: CVA  Admit Date/Time:  08/11/2016  5:41 PM Admission Comments: No comment available   Primary Diagnosis:  Gait disturbance, post-stroke Principal Problem: Gait disturbance, post-stroke  Patient Active Problem List   Diagnosis Date Noted  . Embolic stroke of right basal ganglia ([place]) 08/16/2016  . 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 Gait disturbance, post-stroke 08/13/2016  . Left middle cerebral artery stroke ([place]) 08/11/2016  . Acute ischemic stroke ([place]) 08/10/2016  . Hypertensive urgency 08/10/2016  . Right middle cerebral artery stroke ([place]) 08/10/2016  . Acute left-sided weakness   . Left-sided weakness   . History of lower GI bleeding   . Hiatal hernia   . 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 Acute post-hemorrhagic anemia 02/03/2013  . Rheumatoid arthritis ([place]) 04/14/2011  . Long-term current use of steroids 04/14/2011  . Iron deficiency anemia 04/14/2011    Expected Discharge Date: Expected Discharge Date: 08/17/16  Team Members Present: Physician leading conference: Dr. Alysia Penna Social Worker Present: Ovidio Kin, LCSW Nurse Present: Other (comment) Jonna Clark) PT Present: Barrie Folk, PT OT Present: Napoleon Form, OT SLP Present: Stormy Fabian, SLP PPS Coordinator present : Daiva Nakayama, RN, CRRN     Current Status/Progress Goal Weekly Team Focus  Medical   Patient ambulating with walker, blood pressure is stabilizing, good intake, continent  Maintain medical stability,  reduce back pain to allow for participate in rehabilitation program  Discharge planning   Bowel/Bladder        Cont B & B     Swallow/Nutrition/ Hydration        na     ADL's     supervision-mod/I needs some assist with shoes and socks-LE bathing   mod/I-supervision level with LE bathing and dressing   family education with wife   Mobility   Mod I with RW. Min-supervision assist without AD for gait.   Mod I with RW   balance, strength, coordination, improved independence with transfers and gait.    Communication             Safety/Cognition/ Behavioral Observations            Pain        less than 3 monitor     Skin        no issues        *See Care Plan and progress notes for long and short-term goals.  Barriers to Discharge: Family needs some training    Possible Resolutions to Barriers:  Complete family training for discharge in a.m.    Discharge Planning/Teaching Needs:  Home with wife who can provide supervision level if necessary. Wife is here daily and participating in therapies with him. Ready for DC tomorrow      Team Discussion:  Pt reaching goals of mod/I level, plans on going to OPPT to get off walker eventually. Wife has been here and participated in therapies with husband. BERG 43/56 still at risk to fall. Made mod/I in room today to build confidence prior to  discharge.  Revisions to Treatment Plan:  DC 5/3 tomorrow   Continued Need for Acute Rehabilitation Level of Care: The patient requires daily medical management by a physician with specialized training in physical medicine and rehabilitation for the following conditions: Daily direction of a multidisciplinary physical rehabilitation program to ensure safe treatment while eliciting the highest outcome that is of practical value to the patient.: Yes Daily medical management of patient stability for increased activity during participation in an intensive rehabilitation regime.: Yes Daily analysis of  laboratory values and/or radiology reports with any subsequent need for medication adjustment of medical intervention for : Neurological problems;Post surgical problems  Khristi Schiller, Gardiner Rhyme 08/17/2016, 8:42 AM

## 2016-08-16 NOTE — Progress Notes (Signed)
Physical Therapy Note  Patient Details  Name: [name] MRN: [id number] Date of Birth: [birth year] Today's Date: 08/16/2016  1105-1135, 30 min individual tx Pain: none per pt  Gait with RW on level tile.  neuromuscular re-education via demo and mulitmodal cues for reciprocal scooting without UE or LE support, x 8 cycles. In standing, L toe/heel taps onto floor targets, and L biased standing during dual task using L hand, requiring wt shifting to L, L trunk rotation and manipulation of bean bags. Gait without AD over level tile and carpet, with min assist for L foot poor clearance when pt was talking.  PT reiterated with pt and wife that he needs to continue to use RW for now.  See function navigator for current status.   Bre Pecina 08/16/2016, 11:26 AM

## 2016-08-16 NOTE — Progress Notes (Signed)
Physical Therapy Discharge Summary  Patient Details  Name: [name] MRN: [id number] Date of Birth: 09-07-1940  Today's Date: 08/16/2016 PT Individual Time: 0830-0930 PT Individual Time Calculation (min): 60 min    Patient has met 7 of 8 long term goals due to improved activity tolerance, improved balance, improved postural control, increased strength, increased range of motion, functional use of  left upper extremity and left lower extremity, improved attention, improved awareness and improved coordination.  Patient to discharge at an ambulatory level Modified Independent.   Patient's care partner is independent to provide the necessary physical assistance at discharge.  Reasons goals not met: Pt requires increased safety cues on stairs, preventing Mod I ascent/descent of steps. Wife aware and able to properly cues pt at supervision level.   Recommendation:  Patient will benefit from ongoing skilled PT services in home health setting to continue to advance safe functional mobility, address ongoing impairments in balance, endurance, strength, coordination, and minimize fall risk.  Equipment: RW  Reasons for discharge: treatment goals met and discharge from hospital  Patient/family agrees with progress made and goals achieved: Yes   PT Treatment: Grad day assessment instructed by PT to measure functional progress towards goals. PT also instructed pt in berg balance test. See below for details. Pt demonstrates supervision assist on stairs due to poor coordination on the LLE and Mod I with all other mobility tasks as listed below.   PT Discharge Precautions/Restrictions Restrictions Weight Bearing Restrictions: No Vital Signs  Pain Pain Assessment Pain Assessment: No/denies pain Pain Score: 0-No pain Vision/Perception  Vision - History Baseline Vision: Wears glasses all the time Patient Visual Report: No change from baseline Vision - Assessment Eye Alignment: Within  Functional Limits Perception Perception: Within Functional Limits Praxis Praxis: Intact  Cognition Overall Cognitive Status: Within Functional Limits for tasks assessed Orientation Level: Oriented X4 Sustained Attention: Appears intact Memory: Appears intact Awareness: Appears intact Problem Solving: Appears intact Safety/Judgment: Appears intact Comments: WFL for all tasks assessed Sensation Sensation Light Touch: Appears Intact Stereognosis: Appears Intact Hot/Cold: Appears Intact Proprioception: Appears Intact Coordination Gross Motor Movements are Fluid and Coordinated: Yes Fine Motor Movements are Fluid and Coordinated: Yes Heel Shin Test: WFL R and L.  Motor  Motor Motor: Hemiplegia Motor - Discharge Observations: mild decreased coordination with the LLE, noted with increased difficulty of tasks.   Mobility Bed Mobility Bed Mobility: Rolling Right;Rolling Left;Sit to Supine;Supine to Sit Rolling Right: 6: Modified independent (Device/Increase time) Rolling Left: 6: Modified independent (Device/Increase time) Supine to Sit: 6: Modified independent (Device/Increase time) Sit to Supine: 6: Modified independent (Device/Increase time) Transfers Transfers: Yes Sit to Stand: 6: Modified independent (Device/Increase time) Stand to Sit: 6: Modified independent (Device/Increase time) Stand Pivot Transfers: 6: Modified independent (Device/Increase time) (with RW) Locomotion  Ambulation Ambulation: Yes Ambulation/Gait Assistance: 6: Modified independent (Device/Increase time) Ambulation Distance (Feet): 200 Feet Assistive device: Rolling walker Gait Gait: Yes Gait Pattern: Step-through pattern Stairs / Additional Locomotion Stairs: Yes Stairs Assistance: 5: Supervision Stairs Assistance Details: Verbal cues for gait pattern;Verbal cues for precautions/safety Stair Management Technique: One rail Left Number of Stairs: 12 Height of Stairs: 6 Wheelchair  Mobility Wheelchair Mobility: No  Trunk/Postural Assessment  Cervical Assessment Cervical Assessment: Within Functional Limits Thoracic Assessment Thoracic Assessment: Within Functional Limits Lumbar Assessment Lumbar Assessment: Within Functional Limits Postural Control Postural Control: Deficits on evaluation Protective Responses: mild delay in standing without UE support.   Balance Berg Balance Test Sit to Stand: Able to stand without using  hands and stabilize independently Standing Unsupported: Able to stand safely 2 minutes Sitting with Back Unsupported but Feet Supported on Floor or Stool: Able to sit safely and securely 2 minutes Stand to Sit: Controls descent by using hands Transfers: Able to transfer safely, definite need of hands Standing Unsupported with Eyes Closed: Able to stand 10 seconds safely Standing Ubsupported with Feet Together: Able to place feet together independently and stand 1 minute safely From Standing, Reach Forward with Outstretched Arm: Can reach forward >12 cm safely (5") From Standing Position, Pick up Object from Floor: Able to pick up shoe, needs supervision From Standing Position, Turn to Look Behind Over each Shoulder: Looks behind from both sides and weight shifts well Turn 360 Degrees: Needs close supervision or verbal cueing Standing Unsupported, Alternately Place Feet on Step/Stool: Able to complete >2 steps/needs minimal assist Standing Unsupported, One Foot in Front: Able to plae foot ahead of the other independently and hold 30 seconds Standing on One Leg: Tries to lift leg/unable to hold 3 seconds but remains standing independently Total Score: 42  Patient demonstrates increased fall risk as noted by score of   42/56 on Berg Balance Scale.  (<36= high risk for falls, close to 100%; 37-45 significant >80%; 46-51 moderate >50%; 52-55 lower >25%)  Extremity Assessment      RLE Assessment RLE Assessment: Within Functional Limits (AROM WFL.  MMT grossly 4+/5 to 5/5 proximal to distal. ) LLE Assessment LLE Assessment: Within Functional Limits (grossly 4+/5 proximal to distal except knee extension 5/5)   See Function Navigator for Current Functional Status.  [name] 08/16/2016, 10:56 AM

## 2016-08-16 NOTE — Progress Notes (Signed)
Occupational Therapy Session Note  Patient Details  Name: [name] MRN: [id number] Date of Birth: Mar 20, 1941  Today's Date: 08/16/2016 OT Individual Time: 2725-3664 and 1300-1400 OT Individual Time Calculation (min): 60 min and 60 min   Short Term Goals: Week 1:  OT Short Term Goal 1 (Week 1): STG=LTG d/t short LOS  Skilled Therapeutic Interventions/Progress Updates:    Session One: Pt seen for OT session focusing on functional activity tolerance and functional mobility. Pt sitting up in w/[name] upon arrival, ready for tx session. He voiced having showered and dressed already this morning, declining bathing/dressing session.  He ambulated to therapy gym with RW and supervision. Per pt request pt completed 9 min on NuStep level 5 for UE/LE strengthening and conditioning.  He then completed 9 hole peg test, see results below. Pt then ambulated throughout hallway without use of AD while balancing tennis ball on racket focusing on dual task attention. He had x2 LOB episodes, requiring min-mod A to regain balance, Pt with LOB episodes with increased external stimuli/ distractions. During seated rest breaks, educated regarding pt's fall risk and steps to reduce risk of falls, esp in highly stimulating environments. He then completed second ambulation trial with RW, ambulating throughout unit required to locate items on R/L while ambulating. Pt with decreased ambulation speed and required cuing for attention to identify objects on L side. Pt returned to gym at end of session with hand off to PT.   9 Hole Peg Test:  R: 23.46, 26.09 and 21.90 L: 33.15, 32.80 and 33.25  Session Two: Pt seen for OT session focusing on functional standing balance/ endurance training,  Ambulation and education. Pt received in therapy gym, ready for tx session.  Utilized dynavision to address standing balance, pt standing to complete task, required to reach across midline and outside BOS, while scanning to all areas to  complete task. He was then completed activity while standing on non-compliant foam mat. Following seated rest break, he completed activity sit<> stand btwn activating each light for LE strengthening and endurance. Following seated rest break, pt ambulated throughout unit while walking therapy dog, no AD used. Pt required overall min A for balance, however, episodes requiring up to mod A as pt with LOB when attempting to increase ambulation speed or with external distractions. Upon return to pt's room, reviewed "[name]" CVA booklet and educated regarding pt's increased risk of CVA as well as steps to reduce stroke risk. Pt and wife present for education and very appreciative of resources. Pt left seated in w/[name] at end of session, all needs in reach.  Pt utilized public restroom during session at mod I level, however, pt initially left RW outside bathroom door prior to ambulating into bathroom. VCs provided to pt regarding need to stay inside RW at all times. Reviewed this with his wife as pt cont to require cuing for RW management during functional tasks.   Therapy Documentation Precautions:  Precautions Precautions: Fall Precaution Comments: Mild L hemiparesis, LLE fatigues with ambulation Restrictions Weight Bearing Restrictions: No Pain:   No/ denies pain ADL: ADL ADL Comments: see Functional Assessment Tool  See Function Navigator for Current Functional Status.   Therapy/Group: Individual Therapy  [name] 08/16/2016, 7:04 AM

## 2016-08-17 DIAGNOSIS — I639 Cerebral infarction, unspecified: Secondary | ICD-10-CM

## 2016-08-17 MED ORDER — FERROUS SULFATE 325 (65 FE) MG PO TABS: 325.0000 mg | ORAL_TABLET | Freq: Two times a day (BID) | ORAL | 3 refills | Status: DC

## 2016-08-17 MED ORDER — APIXABAN 5 MG PO TABS: 5.0000 mg | ORAL_TABLET | Freq: Two times a day (BID) | ORAL | 2 refills | Status: AC

## 2016-08-17 MED ORDER — TRAMADOL HCL 50 MG PO TABS: 50.0000 mg | ORAL_TABLET | Freq: Four times a day (QID) | ORAL | 0 refills | Status: DC | PRN

## 2016-08-17 MED ORDER — OMEPRAZOLE 20 MG PO CPDR: 20.0000 mg | DELAYED_RELEASE_CAPSULE | Freq: Every day | ORAL | 1 refills | Status: DC

## 2016-08-17 MED ORDER — PREDNISONE 5 MG PO TABS: 5.0000 mg | ORAL_TABLET | Freq: Every day | ORAL | 1 refills | Status: DC

## 2016-08-17 MED ORDER — ATORVASTATIN CALCIUM 80 MG PO TABS: 40.0000 mg | ORAL_TABLET | Freq: Every day | ORAL | 12 refills | Status: DC

## 2016-08-17 NOTE — Progress Notes (Signed)
Patient and family discussed all the discharged instructions with PA,before they left the hospital.

## 2016-08-17 NOTE — Progress Notes (Signed)
Social Work  Discharge Note  The overall goal for the admission was met for:   Discharge location: Yes-HOME WITH WIFE WHO CAN BE THERE WITH HIM.  Length of Stay: Yes-7 DAYS  Discharge activity level: Yes-MOD/I LEVEL  Home/community participation: Yes  Services provided included: MD, RD, PT, OT, RN, CM, TR, Pharmacy and SW  Financial Services: Private Insurance: [employer]  Follow-up services arranged: Outpatient: [facility]PT 5/7 12:45-2:00 PM  Comments (or additional information):WIFE WAS HERE DAILY AND PARTICIPATED IN THERAPIES WITH PT. BOTH FEEL READY TO GO HOME TODAY AND VERY PLEASED WITH HIS PROGRESS WHILE HERE.  Patient/Family verbalized understanding of follow-up arrangements: Yes  Individual responsible for coordination of the follow-up plan: SELF & JUDY-WIFE  Confirmed correct DME delivered: Elease Hashimoto 08/17/2016    Elease Hashimoto

## 2016-08-17 NOTE — Plan of Care (Signed)
Problem: RH Stairs Goal: LTG Patient will ambulate up and down stairs w/assist (PT) LTG: Patient will ambulate up and down # of stairs with assistance (PT)  Outcome: Adequate for Discharge Requires supervision assist from Wife for safety with stair management.

## 2016-08-17 NOTE — Progress Notes (Signed)
Subjective/Complaints: No issues overnite ROS-  No CP,SOB, N/V/D  Objective: Vital Signs: Blood pressure (!) 142/72, pulse 67, temperature 97.9 F (36.6 C), temperature source Oral, resp. rate 17, height 5\' 7"  (1.702 m), weight 78 kg (171 lb 14.4 oz), SpO2 97 %. No results found. Results for orders placed or performed during the hospital encounter of 08/11/16 (from the past 72 hour(s))  Iron and TIBC     Status: Abnormal   Collection Time: 08/14/16 12:21 PM  Result Value Ref Range   Iron 10 (L) 45 - 182 ug/dL   TIBC 360 250 - 450 ug/dL   Saturation Ratios 3 (L) 17.9 - 39.5 %   UIBC 350 ug/dL     HEENT: normal Cardio: RRR and no murmur Resp: clear to ausc, no resp distress  GI: BS positive and NT, ND Extremity:  No Edema Skin:   Intact Neuro: Alert/Oriented, Cranial Nerve II-XII normal, Normal Sensory, Abnormal Motor 5/5 on R sie , 4+ on Left side and Abnormal FMC Ataxic/ dec FMC Musc/Skel:  Other bilateral healed TKR incision Gen NAD   Assessment/Plan: 1. Functional deficits secondary to Right caudate /PLIC infarct Stable for D/C today F/u PCP in 3-4 weeks F/u PM&R 2 weeks See D/C summary See D/C instructions. FIM: Function - Bathing Position: Shower Body parts bathed by patient: Right arm, Left arm, Chest, Abdomen, Front perineal area, Buttocks, Right upper leg, Left upper leg, Right lower leg, Left lower leg, Back Body parts bathed by helper: Back Assist Level: More than reasonable time  Function- Upper Body Dressing/Undressing What is the patient wearing?: Pull over shirt/dress Pull over shirt/dress - Perfomed by patient: Thread/unthread left sleeve, Thread/unthread right sleeve, Put head through opening, Pull shirt over trunk Assist Level: No help, No cues Set up : To obtain clothing/put away Function - Lower Body Dressing/Undressing What is the patient wearing?: Underwear, Pants, Socks, Shoes Position: Wheelchair/chair at sink Underwear - Performed by  patient: Thread/unthread right underwear leg, Thread/unthread left underwear leg, Pull underwear up/down Pants- Performed by patient: Thread/unthread right pants leg, Thread/unthread left pants leg, Pull pants up/down, Fasten/unfasten pants Socks - Performed by patient: Don/doff right sock, Don/doff left sock Shoes - Performed by patient: Don/doff right shoe, Don/doff left shoe Assist for footwear: Independent Assist for lower body dressing: More than reasonable time Set up : To obtain clothing/put away  Function - Toileting Toileting steps completed by patient: Adjust clothing prior to toileting, Performs perineal hygiene, Adjust clothing after toileting Toileting Assistive Devices: Grab bar or rail Assist level: More than reasonable time  Function - Air cabin crew transfer assistive device: [place] level to toilet: No Help, no cues, assistive device, takes more than a reasonable amount of time Assist level from toilet: No Help, no cues, assistive device, takes more than a reasonable amount of time  Function - Chair/bed transfer Chair/bed transfer method: Stand pivot Chair/bed transfer assist level: No Help, no cues, assistive device, takes more than a reasonable amount of time Chair/bed transfer assistive device: Walker Chair/bed transfer details: Verbal cues for technique, Verbal cues for precautions/safety, Verbal cues for safe use of DME/AE, Verbal cues for sequencing  Function - Locomotion: Wheelchair Will patient use wheelchair at discharge?: No Wheelchair activity did not occur: N/A Wheel 50 feet with 2 turns activity did not occur: N/A Wheel 150 feet activity did not occur: N/A Function - Locomotion: Ambulation Assistive device: Walker-rolling Max distance: 226ft  Assist level: No help, No cues, assistive device, takes more than a  reasonable amount of time Assist level: No help, No cues, assistive device, takes more than a reasonable amount of time Assist  level: No help, No cues, assistive device, takes more than a reasonable amount of time Walk 150 feet activity did not occur: Safety/medical concerns Assist level: No help, No cues, assistive device, takes more than a reasonable amount of time Assist level: Moderate assist (Pt 50 - 74%)  Function - Comprehension Comprehension: Auditory Comprehension assist level: Follows complex conversation/direction with extra time/assistive device  Function - Expression Expression: Verbal Expression assist level: Expresses basic needs/ideas: With no assist  Function - Social Interaction Social Interaction assist level: Interacts appropriately with others - No medications needed.  Function - Problem Solving Problem solving assist level: Solves complex 90% of the time/cues < 10% of the time  Function - Memory Memory assist level: Recognizes or recalls 90% of the time/requires cueing < 10% of the time Patient normally able to recall (first 3 days only): Current season, Location of own room, Staff names and faces, That he or she is in a hospital  Medical Problem List and Plan: 1.  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  DVT Prophylaxis/Anticoagulation: Subcutaneous Lovenox. Venous Doppler studies negative 3. Pain Management: Ultram as needed 4. Mood: Provide emotional support 5. Neuropsych: This patient is capable of making decisions on his own behalf. 6. Skin/Wound Care: Routine skin checks 7. Fluids/Electrolytes/Nutrition: Routine I&O's with follow-up chemistries 8. Rheumatoid arthritis.Arava 20 mg daily, chronic prednisone 5 mg daily, no current joint pain or swelling, S/P B TKR, pt state hands and feet have been mainly affected in past 9. Hyperlipidemia. Lipitor- pt c/o of taking another pill but discussed this med was rec by neuro 10. History of diverticulosis with recurrent  GI bleed status post partial colectomy in the past.Short bowel syndrome trial low residue diet 12.  Microcytic Anemia hx of Fe def recheck Fe studies, may need supplementation 13.  Hypoalbuminemia - 3.1 , mild, Prostat LOS (Days) 6 A FACE TO FACE EVALUATION WAS PERFORMED  [name] E 08/17/2016, 8:15 AM

## 2016-08-21 ENCOUNTER — Encounter: Payer: Self-pay | Admitting: Physical Therapy

## 2016-08-21 ENCOUNTER — Ambulatory Visit: Payer: PPO | Attending: Physical Medicine & Rehabilitation | Admitting: Physical Therapy

## 2016-08-21 DIAGNOSIS — R2689 Other abnormalities of gait and mobility: Secondary | ICD-10-CM | POA: Diagnosis not present

## 2016-08-21 DIAGNOSIS — R2681 Unsteadiness on feet: Secondary | ICD-10-CM

## 2016-08-21 DIAGNOSIS — M6281 Muscle weakness (generalized): Secondary | ICD-10-CM

## 2016-08-21 DIAGNOSIS — I69354 Hemiplegia and hemiparesis following cerebral infarction affecting left non-dominant side: Secondary | ICD-10-CM | POA: Diagnosis not present

## 2016-08-21 NOTE — Therapy (Signed)
[facility] [address] [place], [place], [address] Phone: [phone]   Fax:  [phone]  Physical Therapy Evaluation  Patient Details  Name: [name] MRN: [id number] Date of Birth: 07-11-1940 Referring Provider: Dr. Alysia Penna  Encounter Date: 08/21/2016      PT End of Session - 08/21/16 1909    Visit Number 1   Number of Visits 17   Date for PT Re-Evaluation 10/20/16   Authorization Type [employer]-G code and 10th visit progress note   PT Start Time 1317   PT Stop Time 1400   PT Time Calculation (min) 43 min   Activity Tolerance Patient tolerated treatment well   Behavior During Therapy [facility] for tasks assessed/performed      Past Medical History:  Diagnosis Date  . Blood transfusion    " no reaction from transfusion"  . BPH (benign prostatic hyperplasia)   . Bruises easily   . Bursitis    chronic hip pain  . Cancer [facility])    prostate cancer cells  . Disc disease, degenerative, lumbar or lumbosacral    Chronic back pain now  . Diverticulitis   . Esophageal ring    requiring dilations   . GERD (gastroesophageal reflux disease)   . History of GI bleed    "vessels burst in colon" x2  . History of kidney stones    chronic  . History of vertigo   . Osteoarthritis   . Osteoporosis   . Pneumonia    yrs ago  . Rash    GROIN - ITCHING PAST 3 MONTHS - it is gone now 05/02/16  . Rheumatoid arthritis(714.0)    27 years  . Spondylisthesis   . Ureteral stone 11/2012    Past Surgical History:  Procedure Laterality Date  . BALLOON DILATION N/A 10/01/2012   Procedure: BALLOON DILATION;  Surgeon: Garlan Fair, MD;  Location: [facility];  Service: Endoscopy;  Laterality: N/A;  . COLECTOMY N/A 06/10/2013   Procedure: TOTAL abdominal COLECTOMY;  Surgeon: Odis Hollingshead, MD;  Location: [facility];  Service: General;  Laterality: N/A;  . COLONOSCOPY    . CYSTOSCOPY WITH  URETEROSCOPY Right 12/05/2012   Procedure: CYSTOSCOPY WITH RIGHT URETEROSCOPY AND STONE EXTRACTION;  Surgeon: Malka So, MD;  Location: [facility];  Service: Urology;  Laterality: Right;  . ESOPHAGOGASTRODUODENOSCOPY N/A 10/01/2012   Procedure: ESOPHAGOGASTRODUODENOSCOPY (EGD);  Surgeon: Garlan Fair, MD;  Location: [facility];  Service: Endoscopy;  Laterality: N/A;  . HERNIA REPAIR    . INGUINAL HERNIA REPAIR  2007   Bilateral   . Lithotripsy for nephrolithiasis     Pt and wife not sure when but here in [place]  . Lysis of Adhesion, small bowel resection  2009  . MAXIMUM ACCESS (MAS)POSTERIOR LUMBAR INTERBODY FUSION (PLIF) 1 LEVEL N/A 05/08/2016   Procedure: LUMBAR FOUR-FIVE FOR MAXIMUM ACCESS (MAS) POSTERIOR LUMBAR INTERBODY FUSION;  Surgeon: Kary Kos, MD;  Location: [facility];  Service: Neurosurgery;  Laterality: N/A;  . NISSEN FUNDOPLICATION     about 4967, pt not sure records not currently available  . TOTAL KNEE ARTHROPLASTY  2003   Bilateral  . TRANSURETHRAL PROSTATECTOMY WITH GYRUS INSTRUMENTS N/A 12/05/2012   Procedure: TRANSURETHRAL PROSTATECTOMY WITH GYRUS INSTRUMENTS;  Surgeon: Malka So, MD;  Location: [facility];  Service: Urology;  Laterality: N/A;  . Ventral Hernia Repair with Mesh      There were no vitals filed for this visit.  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  Pt now home and ambulating with cane.  Pt reports good return in mobility and function but continues to have decreased functional endurance in LUE and LLE with LLE catching on the floor when he ambulates too quickly and reports veering to the L during gait.   Patient is accompained by: Family member   Pertinent History RA, GI bleed with colectomy, prostate CA, bilat TKA, Spondylolisthesis at L4-L5, HTN, hiatal hernia, lumbar decompression for stenosis   Limitations Walking   Patient Stated  Goals Make the LLE stronger   Currently in Pain? No/denies            [facility] PT Assessment - 08/21/16 1331      Assessment   Medical Diagnosis R CVA   Referring Provider Dr. Alysia Penna   Onset Date/Surgical Date 08/11/16   Hand Dominance Right   Next MD Visit Dr. Lajean Manes 5/25   Prior Therapy prior to back surgery     Precautions   Precautions Other (comment)   Precaution Comments Hx of RA, GI bleed with partial colectomy, prostate CA, bilateral TKA, Spondylolisthesis at L4-L5, HTN, R MCA CVA, Hiatal hernia, recent lumbar decompression for stenosis     Balance Screen   Has the patient fallen in the past 6 months No   Has the patient had a decrease in activity level because of a fear of falling?  No   Is the patient reluctant to leave their home because of a fear of falling?  No     Home [employer] residence   Living Arrangements Spouse/significant other   Available Help at Discharge Family   Type of [place] Access Level entry   [place] Two level;Bed/bath upstairs   Alternate Level Stairs-Number of Steps 14   Alternate Level Stairs-Rails Left   [place] - single point   Additional Comments goal is to walk without SPC     Prior Function   Level of Independence Independent     Observation/Other Assessments   Focus on Therapeutic Outcomes (FOTO)  72 (28% limited, predicted 22% limited)   Other Surveys  Other Surveys   Stroke Impact Scale  72.2%     Sensation   Light Touch Appears Intact     ROM / Strength   AROM / PROM / Strength Strength     Strength   Overall Strength Within functional limits for tasks performed   Overall Strength Comments 5/5 bilat per MMT; pt reports that LLE is "wobbly" and he veers to the L during gait and LLE gives out quicker     Ambulation/Gait   Ambulation/Gait Yes   Ambulation/Gait Assistance 6: Modified independent (Device/Increase time)   Ambulation Distance (Feet)  200 Feet   Assistive device Straight cane  supervision without cane   Gait Pattern Step-through pattern;Decreased stance time - left;Decreased dorsiflexion - left;Decreased weight shift to right;Wide base of support;Poor foot clearance - left  hip ER   Ambulation Surface Level;Indoor   Stairs Yes   Stairs Assistance 5: Supervision   Stairs Assistance Details (indicate cue type and reason) Intermittent L foot catching on stair when ascending and descending   Stair Management Technique One rail Left;Alternating pattern;Forwards   Number of Stairs 16   Height of Stairs 6  Standardized Balance Assessment   Standardized Balance Assessment Five Times Sit to Stand;10 meter walk test;Dynamic Gait Index   Five times sit to stand comments  14 seconds, from mat, no UE   10 Meter Walk 11.86 seconds or 2.76 ft/sec     Dynamic Gait Index   Level Surface Moderate Impairment   Change in Gait Speed Mild Impairment   Gait with Horizontal Head Turns Mild Impairment   Gait with Vertical Head Turns Mild Impairment   Gait and Pivot Turn Moderate Impairment   Step Over Obstacle Moderate Impairment   Step Around Obstacles Severe Impairment   Steps Mild Impairment   Total Score 11   DGI comment: 11/24; Interpretation  < 19/24 = predictive of falls in the elderly,   > 22/24 = safe ambulators                            PT Education - 08/21/16 1909    Education provided Yes   Education Details Clinical findings, PT POC and goals   Person(s) Educated Patient;Spouse   Methods Explanation   Comprehension Verbalized understanding          PT Short Term Goals - 08/21/16 2037      PT SHORT TERM GOAL #1   Title (TARGET DATE FOR ALL STG 09/20/16)  Pt will improve LE functional strength as indicated by 5 times sit to stand =/< 12 seconds   Baseline 14 seconds without UE from mat   Time 4   Period Weeks   Status New     PT SHORT TERM GOAL #2   Title Pt will improve safety with  community gait as indicated by increase in gait velocity to = / > 3.28 ft/sec   Baseline 2.76 ft/sec   Time 4   Period Weeks   Status New     PT SHORT TERM GOAL #3   Title Pt will decrease falls risk during gait as indicated by improvement in DGI to = / > 19/24   Baseline 11/24 without AD   Time 4   Period Weeks   Status New     PT SHORT TERM GOAL #4   Title Pt will negotiate 16 stairs with one rail and alternating sequence Mod I without catching/dragging L foot when ascending or descending   Time 4   Period Weeks   Status New           PT Long Term Goals - 08/21/16 2049      PT LONG TERM GOAL #1   Title (TARGET DATE FOR ALL LTG 10/20/2016)  Pt will demonstrate independence with balance/strengthening HEP   Time 8   Period Weeks   Status New     PT LONG TERM GOAL #2   Title Pt will improve gait velocity to >3.49ft/sec without AD   Time 8   Period Weeks   Status New     PT LONG TERM GOAL #3   Title Pt will decrease falls risk during gait by improving DGI score to >22/24 without AD   Time 8   Period Weeks   Status New     PT LONG TERM GOAL #4   Title Pt will ambulate >1,000 outside over uneven grassy surfaces while hitting whiffle golf ball x 10 times without LOB   Time 8   Period Weeks   Status New     PT LONG TERM GOAL #5   Title Pt  will report recovery from CVA at >85% on Stroke Impace Scale   Baseline 72%   Time 8   Period Weeks   Status New               Plan - Aug 31, 2016 1910    Clinical Impression Statement Pt is a 76 year old male presenting to OPPT neuro for low complexity PT evaluation s/p R MCA CVA with residual L sided weakness. Pt's PMH significant for the following: RA, GI bleed with partial colectomy, prostate CA, bilateral TKA, Spondylolisthesis at L4-L5, HTN, R MCA CVA, Hiatal hernia, recent lumbar decompression for stenosis. The following deficits were noted during pt's exam: impaired functional strength and endurance LUE and LLE, impaired  balance, impaired gait and impaired motor planning and motor control. Pt's gait speed indicates pt is safe to ambulate at a limited community level. Pt's DGI score indicates pt is at risk for falls. Pt would benefit from skilled PT to address these impairments and functional limitations to maximize functional mobility independence and reduce falls risk.   Rehab Potential Good   Clinical Impairments Affecting Rehab Potential Extensive medical history: RA with chronic prednisone use, GI bleed with partial colectomy, prostate CA, bilateral TKA, Spondylolisthesis at L4-L5, HTN, R MCA CVA, Hiatal hernia, recent lumbar decompression for stenosis   PT Frequency 2x / week   PT Duration 8 weeks   PT Treatment/Interventions ADLs/Self Care Home Management;DME Instruction;Gait training;Stair training;Functional mobility training;Therapeutic activities;Therapeutic exercise;Balance training;Neuromuscular re-education;Patient/family education;Orthotic Fit/Training;Taping   PT Next Visit Plan Initiate high level gait and balance HEP: focus on motor control, functional strengthening and endurance LLE, gait speed, foot clearance during gait, gait without cane, obstacle negotiation   PT Home Exercise Plan Incorporate walking outside, balance with golfing activities on grass   Consulted and Agree with Plan of Care Patient;Family member/caregiver   Family Member Consulted wife      Patient will benefit from skilled therapeutic intervention in order to improve the following deficits and impairments:  Abnormal gait, Decreased balance, Difficulty walking, Decreased strength, Other (comment) (impaired motor planning and motor control)  Visit Diagnosis: Hemiplegia and hemiparesis following cerebral infarction affecting left non-dominant side (HCC)  Muscle weakness (generalized)  Other abnormalities of gait and mobility  Unsteadiness on feet      G-Codes - 08/31/2016 2054/07/25    Functional Assessment Tool Used (Outpatient  Only) DGI, gait velocity, stroke impace scale   Functional Limitation Mobility: Walking and moving around   Mobility: Walking and Moving Around Current Status [phone]) At least 20 percent but less than 40 percent impaired, limited or restricted   Mobility: Walking and Moving Around Goal Status [phone]) At least 1 percent but less than 20 percent impaired, limited or restricted       Problem List Patient Active Problem List   Diagnosis Date Noted  . Embolic stroke of right basal ganglia ([place]) 08/16/2016  . 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 Gait disturbance, post-stroke 08/13/2016  . Left middle cerebral artery stroke ([place]) 08/11/2016  . Acute ischemic stroke ([place]) 08/10/2016  . Hypertensive urgency 08/10/2016  . Right middle cerebral artery stroke ([place]) 08/10/2016  . Acute left-sided weakness   . Left-sided weakness   . History of lower GI bleeding   . Hiatal hernia   . 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 Acute post-hemorrhagic anemia 02/03/2013  . Rheumatoid arthritis ([place]) 04/14/2011  . Long-term current use of steroids  04/14/2011  . Iron deficiency anemia 04/14/2011    Raylene Everts, PT, DPT 08/21/16    8:58 PM    [facility] [address] [place], [place], [address] Phone: [phone]   Fax:  [phone]  Name: [name] MRN: [id number] Date of Birth: 13-Jul-1940

## 2016-08-28 ENCOUNTER — Encounter: Payer: Self-pay | Admitting: Rehabilitation

## 2016-08-28 ENCOUNTER — Ambulatory Visit: Payer: PPO | Admitting: Rehabilitation

## 2016-08-28 DIAGNOSIS — M6281 Muscle weakness (generalized): Secondary | ICD-10-CM

## 2016-08-28 DIAGNOSIS — R2689 Other abnormalities of gait and mobility: Secondary | ICD-10-CM

## 2016-08-28 DIAGNOSIS — I69354 Hemiplegia and hemiparesis following cerebral infarction affecting left non-dominant side: Secondary | ICD-10-CM | POA: Diagnosis not present

## 2016-08-28 DIAGNOSIS — R2681 Unsteadiness on feet: Secondary | ICD-10-CM

## 2016-08-28 NOTE — Patient Instructions (Addendum)
Tandem Walking    Along the countertop, walk with each foot directly in front of other, heel of one foot touching toes of other foot with each step. Both feet straight ahead.  Walk forward to end of counter, then backwards.  Repeat x 3 reps forwards and backwards.     Copyright  VHI. All rights reserved.   "I love a Engineer, mining top for support if needed,  march forwards to end of counter, then backwards.  Do this slowly and with as little support as possible. Repeat x 3 laps down and back.    http://gt2.exer.us/344   Copyright  VHI. All rights reserved.   SINGLE LIMB STANCE    Stance: single leg on floor. Raise leg. Hold _15__ seconds. Repeat with other leg. __3_ reps per set, __1-2_ sets per day, _5-7__ days per week  Copyright  VHI. All rights reserved.     Do these standing in corner with a chair in front of you for support.    Feet Apart (Compliant Surface) Varied Arm Positions - Eyes Closed    Stand on compliant surface: ___pillow____ with feet shoulder width apart and arms out. Close eyes and visualize upright position. Hold_30___ seconds. Repeat __3__ times per session. Do __2__ sessions per day.  Copyright  VHI. All rights reserved.   Feet Together (Compliant Surface) Head Motion - Eyes Open    With eyes open, standing on compliant surface: ___pillow_____, feet together, move head slowly: up and down x 10 reps, and side to side x 10 reps.  Do __2__ sessions per day.  Copyright  VHI. All rights reserved.

## 2016-08-28 NOTE — Therapy (Signed)
[facility] [address] [place], [place], [address] Phone: [phone]   Fax:  [phone]  Physical Therapy Treatment  Patient Details  Name: [name] MRN: [id number] Date of Birth: 04/20/1940 Referring Provider: Dr. Alysia Penna  Encounter Date: 08/28/2016      PT End of Session - 08/28/16 1139    Visit Number 2   Number of Visits 17   Date for PT Re-Evaluation 10/20/16   Authorization Type [employer]-G code and 10th visit progress note   PT Start Time 0931   PT Stop Time 1015   PT Time Calculation (min) 44 min   Activity Tolerance Patient tolerated treatment well   Behavior During Therapy [facility] for tasks assessed/performed      Past Medical History:  Diagnosis Date  . Blood transfusion    " no reaction from transfusion"  . BPH (benign prostatic hyperplasia)   . Bruises easily   . Bursitis    chronic hip pain  . Cancer [facility])    prostate cancer cells  . Disc disease, degenerative, lumbar or lumbosacral    Chronic back pain now  . Diverticulitis   . Esophageal ring    requiring dilations   . GERD (gastroesophageal reflux disease)   . History of GI bleed    "vessels burst in colon" x2  . History of kidney stones    chronic  . History of vertigo   . Osteoarthritis   . Osteoporosis   . Pneumonia    yrs ago  . Rash    GROIN - ITCHING PAST 3 MONTHS - it is gone now 05/02/16  . Rheumatoid arthritis(714.0)    27 years  . Spondylisthesis   . Ureteral stone 11/2012    Past Surgical History:  Procedure Laterality Date  . BALLOON DILATION N/A 10/01/2012   Procedure: BALLOON DILATION;  Surgeon: Garlan Fair, MD;  Location[facility];  Service: Endoscopy;  Laterality: N/A;  . COLECTOMY N/A 06/10/2013   Procedure: TOTAL abdominal COLECTOMY;  Surgeon: Odis Hollingshead, MD;  Location: [facility];  Service: General;  Laterality: N/A;  . COLONOSCOPY    . CYSTOSCOPY WITH  URETEROSCOPY Right 12/05/2012   Procedure: CYSTOSCOPY WITH RIGHT URETEROSCOPY AND STONE EXTRACTION;  Surgeon: Malka So, MD;  [facility]: [facility];  Service: Urology;  Laterality: Right;  . ESOPHAGOGASTRODUODENOSCOPY N/A 10/01/2012   Procedure: ESOPHAGOGASTRODUODENOSCOPY (EGD);  Surgeon: Garlan Fair, MD;  Location: [facility];  Service: Endoscopy;  Laterality: N/A;  . HERNIA REPAIR    . INGUINAL HERNIA REPAIR  2007   Bilateral   . Lithotripsy for nephrolithiasis     Pt and wife not sure when but here in [place]  . Lysis of Adhesion, small bowel resection  2009  . MAXIMUM ACCESS (MAS)POSTERIOR LUMBAR INTERBODY FUSION (PLIF) 1 LEVEL N/A 05/08/2016   Procedure: LUMBAR FOUR-FIVE FOR MAXIMUM ACCESS (MAS) POSTERIOR LUMBAR INTERBODY FUSION;  Surgeon: Kary Kos, MD;  Location: [facility];  Service: Neurosurgery;  Laterality: N/A;  . NISSEN FUNDOPLICATION     about 6144, pt not sure records not currently available  . TOTAL KNEE ARTHROPLASTY  2003   Bilateral  . TRANSURETHRAL PROSTATECTOMY WITH GYRUS INSTRUMENTS N/A 12/05/2012   Procedure: TRANSURETHRAL PROSTATECTOMY WITH GYRUS INSTRUMENTS;  Surgeon: Malka So, MD;  [facility];  Service: Urology;  Laterality: N/A;  . Ventral Hernia Repair with Mesh      There were no vitals filed for this visit.  Subjective Assessment - 08/28/16 0934    Subjective Note that he has increased pain in R LE due to taking Lipitor, has stopped but did not have MD approval, but is seeing MD on Thursday.     Pertinent History RA, GI bleed with colectomy, prostate CA, bilat TKA, Spondylolisthesis at L4-L5, HTN, hiatal hernia, lumbar decompression for stenosis   Limitations Walking   Patient Stated Goals Make the LLE stronger   Currently in Pain? No/denies                         [facility] Adult PT Treatment/Exercise - 08/28/16 0001      Ambulation/Gait   Ambulation/Gait Yes   Ambulation/Gait Assistance 6: Modified independent  (Device/Increase time)   Ambulation Distance (Feet) 200 Feet  indoors and 300' outdoors   Assistive device None   Gait Pattern Step-through pattern;Decreased stance time - left;Decreased dorsiflexion - left;Decreased weight shift to right;Wide base of support;Poor foot clearance - left   Ambulation Surface Level;Indoor   Ramp 6: Modified independent (Device)   Curb 6: Modified independent (Device/increase time)   Gait Comments Assessed gait over indoor surfaces without cane.  Note that he tends to ambulate with B LE in ER (more on R side) and does have mild tendency to veer L, but able to self correct.  Also assessed gait over outdoor surfaces without device on unlevel paved and grassy surfaces.  Pt able to ambulate at mod I level.  Provided okay to ambulate without device.  Pt did verbalize that he has been but does carry cane just in case.       Neuro Re-ed    Neuro Re-ed Details  Went over high level balance exercises in corner and at counter top.  See pt instruction for exercises and reps performed.  Also performed gait over and around obstacles for increased challenge.  No overt LOB, however feel that he was safer leading with LLE when stepping over obstacle.  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                  PT Education - 08/28/16 [id number]    Education provided Yes   Education Details HEP   Person(s) Educated Patient   Methods Explanation;Handout;Demonstration   Comprehension Verbalized understanding;Returned demonstration          PT Short Term Goals - 08/21/16 2037      PT SHORT TERM GOAL #1   Title (TARGET DATE FOR ALL STG 09/20/16)  Pt will improve LE functional strength as indicated by 5 times sit to stand =/< 12 seconds   Baseline 14 seconds without UE from mat   Time 4   Period Weeks   Status New     PT SHORT TERM GOAL #2   Title Pt will improve safety with community gait as indicated by increase in gait velocity to = / > 3.28 ft/sec   Baseline  2.76 ft/sec   Time 4   Period Weeks   Status New     PT SHORT TERM GOAL #3   Title Pt will decrease falls risk during gait as indicated by improvement in DGI to = / > 19/24   Baseline 11/24 without AD   Time 4   Period Weeks   Status New     PT SHORT TERM GOAL #4   Title Pt will negotiate 16 stairs with one rail and alternating sequence Mod I without catching/dragging L foot  when ascending or descending   Time 4   Period Weeks   Status New           PT Long Term Goals - 08/21/16 2049      PT LONG TERM GOAL #1   Title (TARGET DATE FOR ALL LTG 10/20/2016)  Pt will demonstrate independence with balance/strengthening HEP   Time 8   Period Weeks   Status New     PT LONG TERM GOAL #2   Title Pt will improve gait velocity to >3.93ft/sec without AD   Time 8   Period Weeks   Status New     PT LONG TERM GOAL #3   Title Pt will decrease falls risk during gait by improving DGI score to >22/24 without AD   Time 8   Period Weeks   Status New     PT LONG TERM GOAL #4   Title Pt will ambulate >1,000 outside over uneven grassy surfaces while hitting whiffle golf ball x 10 times without LOB   Time 8   Period Weeks   Status New     PT LONG TERM GOAL #5   Title Pt will report recovery from CVA at >85% on Stroke Impace Scale   Baseline 72%   Time 8   Period Weeks   Status New               Plan - 08/28/16 1008    Clinical Impression Statement Skilled session focused on initiation of high level balance HEP and gait indoors/outdoors without device.  Feel that he is safe without AD to ambulate to clinic from now on.  Pt verbalized understanding.     Rehab Potential Good   Clinical Impairments Affecting Rehab Potential Extensive medical history: RA with chronic prednisone use, GI bleed with partial colectomy, prostate CA, bilateral TKA, Spondylolisthesis at L4-L5, HTN, R MCA CVA, Hiatal hernia, recent lumbar decompression for stenosis   PT Frequency 2x / week   PT Duration 8  weeks   PT Treatment/Interventions ADLs/Self Care Home Management;DME Instruction;Gait training;Stair training;Functional mobility training;Therapeutic activities;Therapeutic exercise;Balance training;Neuromuscular re-education;Patient/family education;Orthotic Fit/Training;Taping   PT Next Visit Plan keep on high level balance, LNMR, and pt to bring a few golf clubs to practice swinging in the grass.     PT Home Exercise Plan Incorporate walking outside, balance with golfing activities on grass   Consulted and Agree with Plan of Care Patient;Family member/caregiver   Family Member Consulted wife      Patient will benefit from skilled therapeutic intervention in order to improve the following deficits and impairments:  Abnormal gait, Decreased balance, Difficulty walking, Decreased strength, Other (comment) (impaired motor planning and motor control)  Visit Diagnosis: Hemiplegia and hemiparesis following cerebral infarction affecting left non-dominant side (HCC)  Muscle weakness (generalized)  Other abnormalities of gait and mobility  Unsteadiness on feet     Problem List Patient Active Problem List   Diagnosis Date Noted  . Embolic stroke of right basal ganglia ([place]) 08/16/2016  . 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 Gait disturbance, post-stroke 08/13/2016  . Left middle cerebral artery stroke ([place]) 08/11/2016  . Acute ischemic stroke ([place]) 08/10/2016  . Hypertensive urgency 08/10/2016  . Right middle cerebral artery stroke ([place]) 08/10/2016  . Acute left-sided weakness   . Left-sided weakness   . History of lower GI bleeding   . Hiatal hernia   . 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 Acute post-hemorrhagic anemia 02/03/2013  . Rheumatoid arthritis ([place]) 04/14/2011  . Long-term current use of steroids 04/14/2011  . Iron deficiency anemia 04/14/2011    Cameron Sprang, PT, MPT [facility] [address] [place], [place], [address] Phone: [phone]   Fax:  [phone] 08/28/16, 11:42 AM  Name: [name] MRN: [id number] Date of Birth: 31-Jan-1941

## 2016-08-29 ENCOUNTER — Encounter: Payer: Self-pay | Admitting: Physical Medicine & Rehabilitation

## 2016-08-29 ENCOUNTER — Encounter: Payer: PPO | Attending: Physical Medicine & Rehabilitation

## 2016-08-29 ENCOUNTER — Ambulatory Visit (HOSPITAL_BASED_OUTPATIENT_CLINIC_OR_DEPARTMENT_OTHER): Payer: PPO | Admitting: Physical Medicine & Rehabilitation

## 2016-08-29 VITALS — BP 160/85 | HR 65 | Resp 14

## 2016-08-29 DIAGNOSIS — R269 Unspecified abnormalities of gait and mobility: Secondary | ICD-10-CM | POA: Diagnosis not present

## 2016-08-29 DIAGNOSIS — I69398 Other sequelae of cerebral infarction: Secondary | ICD-10-CM | POA: Insufficient documentation

## 2016-08-29 DIAGNOSIS — E1140 Type 2 diabetes mellitus with diabetic neuropathy, unspecified: Secondary | ICD-10-CM | POA: Insufficient documentation

## 2016-08-29 DIAGNOSIS — E1142 Type 2 diabetes mellitus with diabetic polyneuropathy: Secondary | ICD-10-CM

## 2016-08-29 NOTE — Patient Instructions (Signed)
Dr Rayann Heman is heart Dr who can talk to you about the heart as a causative factor in your stroke

## 2016-08-29 NOTE — Progress Notes (Signed)
Subjective:    Patient ID: [name], male    DOB: 03/02/1941, 76 y.o.   MRN: [id number] 76 year old right-handed male, history of diverticulosis; recurrent GI bleed with partial colectomy; prostate cancer; rheumatoid arthritis, on low-dose prednisone; bilateral TKA.  Lives with spouse, independent prior to admission.  Presented August 10, 2016, with left-sided weakness of acute onset resulting in a fall with no loss of consciousness.  Blood pressure mildly elevated, 170/100.  CT-MRI showed acute ischemia of the right caudate tail extending into the posterior limb of the right internal capsule. Punctate foci of acute ischemia within the left parietal lobe and at the right temporal occipital junction.  MRA unremarkable.  The patient did not receive tPA.  CT angiogram of head and neck showed narrowing emergent intracranial large vessel occlusion.  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 Echocardiogram with an ejection fraction of 30%, grade 1 diastolic dysfunction.  No current plan for TEE secondary to history of GI history.  Venous Dopplers lower extremities negative.  Maintained on Eliquis for CVA prophylaxis.  The patient was admitted for comprehensive rehab program. DATE OF ADMISSION:  08/11/2016 DATE OF DISCHARGE:  08/17/2016  HPI At home with wife Mod I bathing and dressing  No falls since discharge  Uses cane outside the house but not in home  Goes to OP PT for gait training and LLE strengthening  Pain Inventory Average Pain 0 Pain Right Now 0 My pain is no pain  In the last 24 hours, has pain interfered with the following? General activity 0 Relation with others 0 Enjoyment of life 0 What TIME of day is your pain at its worst? no pain Sleep (in general) Good  Pain is worse with: no pain Pain improves with: no pain Relief from Meds: no pain  Mobility walk with assistance use a cane ability to climb  steps?  yes do you drive?  yes  Function retired  Neuro/Psych trouble walking  Prior Studies Any changes since last visit?  no  Physicians involved in your care Any changes since last visit?  no   Family History  Problem Relation Age of Onset  . Adopted: Yes  . Heart failure Mother   . Heart failure Father    Social History   Social History  . Marital status: Married    Spouse name: [name]  . Number of children: 2  . Years of education: 12   Occupational History  .      Chief Financial Officer, retired   Social History Main Topics  . Smoking status: Never Smoker  . Smokeless tobacco: Never Used  . Alcohol use No  . Drug use: No  . Sexual activity: Not Currently   Other Topics Concern  . None   Social History Narrative   Lives at home with wife   Caffeine - Cokes, 1-2 weekly; coffee, 2-3 cups daily in winter   Past Surgical History:  Procedure Laterality Date  . BALLOON DILATION N/A 10/01/2012   Procedure: BALLOON DILATION;  Surgeon: Garlan Fair, MD;  Location: [facility];  Service: Endoscopy;  Laterality: N/A;  . COLECTOMY N/A 06/10/2013   Procedure: TOTAL abdominal COLECTOMY;  Surgeon: Odis Hollingshead, MD;  [facility];  Service: General;  Laterality: N/A;  . COLONOSCOPY    . CYSTOSCOPY WITH URETEROSCOPY Right 12/05/2012   Procedure: CYSTOSCOPY WITH RIGHT URETEROSCOPY AND STONE EXTRACTION;  Surgeon: Malka So, MD;  [facility];  Service: Urology;  Laterality: Right;  . ESOPHAGOGASTRODUODENOSCOPY N/A 10/01/2012   Procedure: ESOPHAGOGASTRODUODENOSCOPY (EGD);  Surgeon: Garlan Fair, MD;  Location: [facility];  Service: Endoscopy;  Laterality: N/A;  . HERNIA REPAIR    . INGUINAL HERNIA REPAIR  2007   Bilateral   . Lithotripsy for nephrolithiasis     Pt and wife not sure when but here in [place]  . Lysis of Adhesion, small bowel resection  2009  . MAXIMUM ACCESS (MAS)POSTERIOR LUMBAR INTERBODY FUSION (PLIF) 1 LEVEL N/A 05/08/2016    Procedure: LUMBAR FOUR-FIVE FOR MAXIMUM ACCESS (MAS) POSTERIOR LUMBAR INTERBODY FUSION;  Surgeon: Kary Kos, MD;  Location: [facility];  Service: Neurosurgery;  Laterality: N/A;  . NISSEN FUNDOPLICATION     about 1017, pt not sure records not currently available  . TOTAL KNEE ARTHROPLASTY  2003   Bilateral  . TRANSURETHRAL PROSTATECTOMY WITH GYRUS INSTRUMENTS N/A 12/05/2012   Procedure: TRANSURETHRAL PROSTATECTOMY WITH GYRUS INSTRUMENTS;  Surgeon: Malka So, MD;  [facility];  Service: Urology;  Laterality: N/A;  . Ventral Hernia Repair with Mesh     Past Medical History:  Diagnosis Date  . Blood transfusion    " no reaction from transfusion"  . BPH (benign prostatic hyperplasia)   . Bruises easily   . Bursitis    chronic hip pain  . Cancer [facility])    prostate cancer cells  . Disc disease, degenerative, lumbar or lumbosacral    Chronic back pain now  . Diverticulitis   . Esophageal ring    requiring dilations   . GERD (gastroesophageal reflux disease)   . History of GI bleed    "vessels burst in colon" x2  . History of kidney stones    chronic  . History of vertigo   . Osteoarthritis   . Osteoporosis   . Pneumonia    yrs ago  . Rash    GROIN - ITCHING PAST 3 MONTHS - it is gone now 05/02/16  . Rheumatoid arthritis(714.0)    27 years  . Spondylisthesis   . Ureteral stone 11/2012   BP (!) 160/85 (BP Location: Left Arm, Patient Position: Sitting, Cuff Size: Normal)   Pulse 65   Resp 14   SpO2 94%   Opioid Risk Score:   Fall Risk Score:  `1  Depression screen PHQ 2/9  No flowsheet data found.  Review of Systems  Constitutional: Negative.   HENT: Negative.   Eyes: Negative.   Respiratory: Negative.   Cardiovascular: Negative.   Gastrointestinal: Negative.   Endocrine: Negative.   Genitourinary: Negative.   Musculoskeletal: Positive for gait problem.  Skin: Negative.   Allergic/Immunologic: Negative.   Psychiatric/Behavioral: Negative.   All other systems  reviewed and are negative.      Objective:   Physical Exam  Constitutional: He is oriented to person, place, and time. He appears well-developed and well-nourished.  HENT:  Head: Normocephalic and atraumatic.  Eyes: Conjunctivae and EOM are normal. Pupils are equal, round, and reactive to light.  Cardiovascular: Normal rate and regular rhythm.   Pulmonary/Chest: Effort normal and breath sounds normal.  Abdominal: Soft. Bowel sounds are normal.  Neurological: He is alert and oriented to person, place, and time. He displays atrophy. A sensory deficit is present. Gait abnormal. Coordination normal.  Foot intrinsic atrophy  Decreased pinprick sensation below the knees bilaterally  Positive Romberg test, falls towards the left and forward.   Psychiatric: He has a normal mood and affect.  Nursing note and vitals reviewed.  Motor  exam is 5/5 bilateral deltoid, biceps, triceps, grip,  right  hip flexor, knee extensor, ankle dorsiflexor 4 plus in the left hip flexor, knee extensor, ankle dorsiflexor        Assessment & Plan:  1. 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 I expect that he should get a full recovery in terms of his lower extremity and gait. He should be finishing his outpatient PT in the next month. I will not schedule follow-up. 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 Cardiology follow-up with Dr. Rayann Heman to discuss the potential role of cardiac embolism in this patient's stroke syndrome.

## 2016-08-31 ENCOUNTER — Ambulatory Visit: Payer: PPO | Admitting: Physical Therapy

## 2016-08-31 VITALS — BP 178/99 | HR 68

## 2016-08-31 DIAGNOSIS — I69354 Hemiplegia and hemiparesis following cerebral infarction affecting left non-dominant side: Secondary | ICD-10-CM

## 2016-08-31 DIAGNOSIS — R2681 Unsteadiness on feet: Secondary | ICD-10-CM

## 2016-08-31 DIAGNOSIS — M6281 Muscle weakness (generalized): Secondary | ICD-10-CM

## 2016-08-31 DIAGNOSIS — R2689 Other abnormalities of gait and mobility: Secondary | ICD-10-CM

## 2016-08-31 NOTE — Patient Instructions (Addendum)
Stroke Prevention Some medical conditions and behaviors are associated with an increased chance of having a stroke. You may prevent a stroke by making healthy choices and managing medical conditions. How can I reduce my risk of having a stroke?  Stay physically active. Get at least 30 minutes of activity on most or all days.  Do not smoke. It may also be helpful to avoid exposure to secondhand smoke.  Limit alcohol use. Moderate alcohol use is considered to be:  No more than 2 drinks per day for men.  No more than 1 drink per day for nonpregnant women.  Eat healthy foods. This involves:  Eating 5 or more servings of fruits and vegetables a day.  Making dietary changes that address high blood pressure (hypertension), high cholesterol, diabetes, or obesity.  Manage your cholesterol levels.  Making food choices that are high in fiber and low in saturated fat, trans fat, and cholesterol may control cholesterol levels.  Take any prescribed medicines to control cholesterol as directed by your health care provider.  Manage your diabetes.  Controlling your carbohydrate and sugar intake is recommended to manage diabetes.  Take any prescribed medicines to control diabetes as directed by your health care provider.  Control your hypertension.  Making food choices that are low in salt (sodium), saturated fat, trans fat, and cholesterol is recommended to manage hypertension.  Ask your health care provider if you need treatment to lower your blood pressure. Take any prescribed medicines to control hypertension as directed by your health care provider.  If you are 18-39 years of age, have your blood pressure checked every 3-5 years. If you are 40 years of age or older, have your blood pressure checked every year.  Maintain a healthy weight.  Reducing calorie intake and making food choices that are low in sodium, saturated fat, trans fat, and cholesterol are recommended to manage  weight.  Stop drug abuse.  Avoid taking birth control pills.  Talk to your health care provider about the risks of taking birth control pills if you are over 35 years old, smoke, get migraines, or have ever had a blood clot.  Get evaluated for sleep disorders (sleep apnea).  Talk to your health care provider about getting a sleep evaluation if you snore a lot or have excessive sleepiness.  Take medicines only as directed by your health care provider.  For some people, aspirin or blood thinners (anticoagulants) are helpful in reducing the risk of forming abnormal blood clots that can lead to stroke. If you have the irregular heart rhythm of atrial fibrillation, you should be on a blood thinner unless there is a good reason you cannot take them.  Understand all your medicine instructions.  Make sure that other conditions (such as anemia or atherosclerosis) are addressed. Get help right away if:  You have sudden weakness or numbness of the face, arm, or leg, especially on one side of the body.  Your face or eyelid droops to one side.  You have sudden confusion.  You have trouble speaking (aphasia) or understanding.  You have sudden trouble seeing in one or both eyes.  You have sudden trouble walking.  You have dizziness.  You have a loss of balance or coordination.  You have a sudden, severe headache with no known cause.  You have new chest pain or an irregular heartbeat. Any of these symptoms may represent a serious problem that is an emergency. Do not wait to see if the symptoms will go away.   Get medical help at once. Call your local emergency services (911 in [place] Do not drive yourself to the hospital. This information is not intended to replace advice given to you by your health care provider. Make sure you discuss any questions you have with your health care provider. Document Released: 05/11/2004 Document Revised: 09/09/2015 Document Reviewed: 10/04/2012 Elsevier  Interactive Patient Education  2017 [employer]  

## 2016-08-31 NOTE — Therapy (Signed)
[facility] [address] [place], [place], [address] Phone: [phone]   Fax:  [phone]  Physical Therapy Treatment  Patient Details  Name: [name] MRN: [id number] Date of Birth: March 05, 1941 Referring Provider: Dr. Alysia Penna  Encounter Date: 08/31/2016      PT End of Session - 08/31/16 1119    Visit Number 3   Number of Visits 17   Date for PT Re-Evaluation 10/20/16   Authorization Type [employer]-G code and 10th visit progress note   PT Start Time 1015   PT Stop Time 1100  unable to treat full time due to high BP   PT Time Calculation (min) 45 min   Activity Tolerance Patient tolerated treatment well   Behavior During Therapy [facility] for tasks assessed/performed      Past Medical History:  Diagnosis Date  . Blood transfusion    " no reaction from transfusion"  . BPH (benign prostatic hyperplasia)   . Bruises easily   . Bursitis    chronic hip pain  . Cancer [facility])    prostate cancer cells  . Disc disease, degenerative, lumbar or lumbosacral    Chronic back pain now  . Diverticulitis   . Esophageal ring    requiring dilations   . GERD (gastroesophageal reflux disease)   . History of GI bleed    "vessels burst in colon" x2  . History of kidney stones    chronic  . History of vertigo   . Osteoarthritis   . Osteoporosis   . Pneumonia    yrs ago  . Rash    GROIN - ITCHING PAST 3 MONTHS - it is gone now 05/02/16  . Rheumatoid arthritis(714.0)    27 years  . Spondylisthesis   . Ureteral stone 11/2012    Past Surgical History:  Procedure Laterality Date  . BALLOON DILATION N/A 10/01/2012   Procedure: BALLOON DILATION;  Surgeon: Garlan Fair, MD;  Location: [facility];  Service: Endoscopy;  Laterality: N/A;  . COLECTOMY N/A 06/10/2013   Procedure: TOTAL abdominal COLECTOMY;  Surgeon: Odis Hollingshead, MD;  Location: [facility];  Service: General;  Laterality: N/A;   . COLONOSCOPY    . CYSTOSCOPY WITH URETEROSCOPY Right 12/05/2012   Procedure: CYSTOSCOPY WITH RIGHT URETEROSCOPY AND STONE EXTRACTION;  Surgeon: Malka So, MD;  Location: [facility];  Service: Urology;  Laterality: Right;  . ESOPHAGOGASTRODUODENOSCOPY N/A 10/01/2012   Procedure: ESOPHAGOGASTRODUODENOSCOPY (EGD);  Surgeon: Garlan Fair, MD;  Location: [facility];  Service: Endoscopy;  Laterality: N/A;  . HERNIA REPAIR    . INGUINAL HERNIA REPAIR  2007   Bilateral   . Lithotripsy for nephrolithiasis     Pt and wife not sure when but here in [place]  . Lysis of Adhesion, small bowel resection  2009  . MAXIMUM ACCESS (MAS)POSTERIOR LUMBAR INTERBODY FUSION (PLIF) 1 LEVEL N/A 05/08/2016   Procedure: LUMBAR FOUR-FIVE FOR MAXIMUM ACCESS (MAS) POSTERIOR LUMBAR INTERBODY FUSION;  Surgeon: Kary Kos, MD;  Location: [facility];  Service: Neurosurgery;  Laterality: N/A;  . NISSEN FUNDOPLICATION     about 2725, pt not sure records not currently available  . TOTAL KNEE ARTHROPLASTY  2003   Bilateral  . TRANSURETHRAL PROSTATECTOMY WITH GYRUS INSTRUMENTS N/A 12/05/2012   Procedure: TRANSURETHRAL PROSTATECTOMY WITH GYRUS INSTRUMENTS;  Surgeon: Malka So, MD;  [facility];  Service: Urology;  Laterality: N/A;  . Ventral Hernia Repair with Mesh      Vitals:  08/31/16 1005 08/31/16 1011 08/31/16 1012  BP: (!) 186/94 (!) 178/108 (!) 178/99  Pulse: 68    SpO2: 93%          Subjective Assessment - 08/31/16 1005    Subjective Pt was D/C from Dr. Read Drivers on Tuesday. Tuesday night pt felt like his RA flared up and felt burning/ aching inside and around joints. Since Tuesday has not been feeling well. BP at Dr, Read Drivers was 160/85.   Currently in Pain? Yes   Pain Score 3    Pain Location Shoulder   Pain Orientation Lateral   Pain Descriptors / Indicators Aching;Burning   Pain Type Chronic pain   Pain Onset More than a month ago   Pain Frequency Intermittent                                  PT Education - 08/31/16 1008    Education provided Yes   Education Details CVA EDU and recommendation to notify PCP of high BP readings.   Person(s) Educated Patient   Methods Explanation;Handout   Comprehension Verbalized understanding          PT Short Term Goals - 08/21/16 2037      PT SHORT TERM GOAL #1   Title (TARGET DATE FOR ALL STG 09/20/16)  Pt will improve LE functional strength as indicated by 5 times sit to stand =/< 12 seconds   Baseline 14 seconds without UE from mat   Time 4   Period Weeks   Status New     PT SHORT TERM GOAL #2   Title Pt will improve safety with community gait as indicated by increase in gait velocity to = / > 3.28 ft/sec   Baseline 2.76 ft/sec   Time 4   Period Weeks   Status New     PT SHORT TERM GOAL #3   Title Pt will decrease falls risk during gait as indicated by improvement in DGI to = / > 19/24   Baseline 11/24 without AD   Time 4   Period Weeks   Status New     PT SHORT TERM GOAL #4   Title Pt will negotiate 16 stairs with one rail and alternating sequence Mod I without catching/dragging L foot when ascending or descending   Time 4   Period Weeks   Status New           PT Long Term Goals - 08/21/16 2049      PT LONG TERM GOAL #1   Title (TARGET DATE FOR ALL LTG 10/20/2016)  Pt will demonstrate independence with balance/strengthening HEP   Time 8   Period Weeks   Status New     PT LONG TERM GOAL #2   Title Pt will improve gait velocity to >3.27ft/sec without AD   Time 8   Period Weeks   Status New     PT LONG TERM GOAL #3   Title Pt will decrease falls risk during gait by improving DGI score to >22/24 without AD   Time 8   Period Weeks   Status New     PT LONG TERM GOAL #4   Title Pt will ambulate >1,000 outside over uneven grassy surfaces while hitting whiffle golf ball x 10 times without LOB   Time 8   Period Weeks   Status New     PT LONG TERM GOAL #5    Title  Pt will report recovery from CVA at >85% on Stroke Impace Scale   Baseline 72%   Time 8   Period Weeks   Status New               Plan - 08/31/16 1120    Clinical Impression Statement Pt not feeling well for this session.  Performed multiple BP checks in seated position at rest.  Pt had high BP readings; this PTA notified PT.  PT recommend  pt notifying PCP of high BP.Marland Kitchen Performed CVA edu between BP checks (see vitals).   Rehab Potential Good   Clinical Impairments Affecting Rehab Potential Extensive medical history: RA with chronic prednisone use, GI bleed with partial colectomy, prostate CA, bilateral TKA, Spondylolisthesis at L4-L5, HTN, R MCA CVA, Hiatal hernia, recent lumbar decompression for stenosis   PT Frequency 2x / week   PT Duration 8 weeks   PT Treatment/Interventions ADLs/Self Care Home Management;DME Instruction;Gait training;Stair training;Functional mobility training;Therapeutic activities;Therapeutic exercise;Balance training;Neuromuscular re-education;Patient/family education;Orthotic Fit/Training;Taping   PT Next Visit Plan check BP initially. keep on high level balance, LNMR, and pt to bring a few golf clubs to practice swinging in the grass.     PT Home Exercise Plan Incorporate walking outside, balance with golfing activities on grass   Consulted and Agree with Plan of Care Patient;Family member/caregiver   Family Member Consulted wife      Patient will benefit from skilled therapeutic intervention in order to improve the following deficits and impairments:  Abnormal gait, Decreased balance, Difficulty walking, Decreased strength, Other (comment) (impaired motor planning and motor control)  Visit Diagnosis: Hemiplegia and hemiparesis following cerebral infarction affecting left non-dominant side (HCC)  Muscle weakness (generalized)  Other abnormalities of gait and mobility  Unsteadiness on feet     Problem List Patient Active Problem List    Diagnosis Date Noted  . Diabetic neuropathy associated with type 2 diabetes mellitus ([place]) 08/29/2016  . Embolic stroke of right basal ganglia ([place]) 08/16/2016  . 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 Gait disturbance, post-stroke 08/13/2016  . Left middle cerebral artery stroke ([place]) 08/11/2016  . Acute ischemic stroke ([place]) 08/10/2016  . Hypertensive urgency 08/10/2016  . Right middle cerebral artery stroke ([place]) 08/10/2016  . Acute left-sided weakness   . Left-sided weakness   . History of lower GI bleeding   . Hiatal hernia   . 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 Acute post-hemorrhagic anemia 02/03/2013  . Rheumatoid arthritis ([place]) 04/14/2011  . Long-term current use of steroids 04/14/2011  . Iron deficiency anemia 04/14/2011    Bjorn Loser 08/31/2016, 11:24 AM  [facility] [address] [place], [place], [address] Phone: [phone]   Fax:  [phone]  Name: [name] MRN: [id number] Date of Birth: 1941-03-26

## 2016-09-04 ENCOUNTER — Encounter: Payer: Self-pay | Admitting: Physical Therapy

## 2016-09-04 ENCOUNTER — Ambulatory Visit: Payer: PPO | Admitting: Physical Therapy

## 2016-09-04 VITALS — BP 145/85 | HR 70

## 2016-09-04 DIAGNOSIS — R2689 Other abnormalities of gait and mobility: Secondary | ICD-10-CM

## 2016-09-04 DIAGNOSIS — R2681 Unsteadiness on feet: Secondary | ICD-10-CM

## 2016-09-04 DIAGNOSIS — M6281 Muscle weakness (generalized): Secondary | ICD-10-CM

## 2016-09-04 DIAGNOSIS — I69354 Hemiplegia and hemiparesis following cerebral infarction affecting left non-dominant side: Secondary | ICD-10-CM | POA: Diagnosis not present

## 2016-09-04 NOTE — Therapy (Signed)
[facility] [address] [place], [place], [address] Phone: [phone]   Fax:  [phone]  Physical Therapy Treatment  Patient Details  Name: [name] MRN: [id number] Date of Birth: [birth year] Referring Provider: Dr. Alysia Penna  Encounter Date: 09/04/2016      PT End of Session - 09/04/16 0850    Visit Number 4   Number of Visits 17   Date for PT Re-Evaluation 10/20/16   Authorization Type [employer]-G code and 10th visit progress note   PT Start Time 0800   PT Stop Time 0845   PT Time Calculation (min) 45 min   Activity Tolerance Patient limited by pain  neuropathic pain   Behavior During Therapy [facility] for tasks assessed/performed      Past Medical History:  Diagnosis Date  . Blood transfusion    " no reaction from transfusion"  . BPH (benign prostatic hyperplasia)   . Bruises easily   . Bursitis    chronic hip pain  . Cancer [facility])    prostate cancer cells  . Disc disease, degenerative, lumbar or lumbosacral    Chronic back pain now  . Diverticulitis   . Esophageal ring    requiring dilations   . GERD (gastroesophageal reflux disease)   . History of GI bleed    "vessels burst in colon" x2  . History of kidney stones    chronic  . History of vertigo   . Osteoarthritis   . Osteoporosis   . Pneumonia    yrs ago  . Rash    GROIN - ITCHING PAST 3 MONTHS - it is gone now 05/02/16  . Rheumatoid arthritis(714.0)    27 years  . Spondylisthesis   . Ureteral stone 11/2012    Past Surgical History:  Procedure Laterality Date  . BALLOON DILATION N/A 10/01/2012   Procedure: BALLOON DILATION;  Surgeon: Garlan Fair, MD;  Location: [facility];  Service: Endoscopy;  Laterality: N/A;  . COLECTOMY N/A 06/10/2013   Procedure: TOTAL abdominal COLECTOMY;  Surgeon: Odis Hollingshead, MD;  [facility];  Service: General;  Laterality: N/A;  . COLONOSCOPY    . CYSTOSCOPY  WITH URETEROSCOPY Right 12/05/2012   Procedure: CYSTOSCOPY WITH RIGHT URETEROSCOPY AND STONE EXTRACTION;  Surgeon: Malka So, MD;  [facility];  Service: Urology;  Laterality: Right;  . ESOPHAGOGASTRODUODENOSCOPY N/A 10/01/2012   Procedure: ESOPHAGOGASTRODUODENOSCOPY (EGD);  Surgeon: Garlan Fair, MD;  Location: [facility];  Service: Endoscopy;  Laterality: N/A;  . HERNIA REPAIR    . INGUINAL HERNIA REPAIR  2007   Bilateral   . Lithotripsy for nephrolithiasis     Pt and wife not sure when but here in [place]  . Lysis of Adhesion, small bowel resection  2009  . MAXIMUM ACCESS (MAS)POSTERIOR LUMBAR INTERBODY FUSION (PLIF) 1 LEVEL N/A 05/08/2016   Procedure: LUMBAR FOUR-FIVE FOR MAXIMUM ACCESS (MAS) POSTERIOR LUMBAR INTERBODY FUSION;  Surgeon: Kary Kos, MD;  Location: [facility];  Service: Neurosurgery;  Laterality: N/A;  . NISSEN FUNDOPLICATION     about 2671, pt not sure records not currently available  . TOTAL KNEE ARTHROPLASTY  2003   Bilateral  . TRANSURETHRAL PROSTATECTOMY WITH GYRUS INSTRUMENTS N/A 12/05/2012   Procedure: TRANSURETHRAL PROSTATECTOMY WITH GYRUS INSTRUMENTS;  Surgeon: Malka So, MD;  [facility];  Service: Urology;  Laterality: N/A;  . Ventral Hernia Repair with Mesh      Vitals:   09/04/16 0806  BP: Marland Kitchen)  145/85  Pulse: 70  SpO2: 91%        Subjective Assessment - 09/04/16 0811    Subjective Pt continues to report not feeling well due to side effects of medication and neuropathic pain in feet severe today but willing to participate as able to tolerance.  Has started a BP medication given to him by PCP.   Patient is accompained by: Family member   Pertinent History RA, GI bleed with colectomy, prostate CA, bilat TKA, Spondylolisthesis at L4-L5, HTN, hiatal hernia, lumbar decompression for stenosis   Limitations Walking   Patient Stated Goals Make the LLE stronger   Currently in Pain? Yes   Pain Score 6    Pain Location Foot   Pain Orientation  Right;Left   Pain Descriptors / Indicators Throbbing;Pins and needles;Burning   Pain Type Neuropathic pain                              Balance Exercises - 09/04/16 0832      Balance Exercises: Standing   SLS Eyes open;Foam/compliant surface;Intermittent upper extremity support;2 reps;10 secs  standing on gravel and then on mulch   Tandem Gait Forward;Intermittent upper extremity support;Foam/compliant surface;4 reps  on gravel and then on mulch   Marching Limitations High knee marching forwards and retro x 4 reps with intermittent UE support over gravel and then over mulch   Lift / Chop Limitations x 10 reps with 3.3 lb medicine ball while performing simulated golf swing on solid surface without head turns (eyes focused on ground) + 5 reps with R and L head turns (gaze follow ball) with min A due to decreased ability to slow momentum and LOB to L             PT Short Term Goals - 08/21/16 2037      PT SHORT TERM GOAL #1   Title (TARGET DATE FOR ALL STG 09/20/16)  Pt will improve LE functional strength as indicated by 5 times sit to stand =/< 12 seconds   Baseline 14 seconds without UE from mat   Time 4   Period Weeks   Status New     PT SHORT TERM GOAL #2   Title Pt will improve safety with community gait as indicated by increase in gait velocity to = / > 3.28 ft/sec   Baseline 2.76 ft/sec   Time 4   Period Weeks   Status New     PT SHORT TERM GOAL #3   Title Pt will decrease falls risk during gait as indicated by improvement in DGI to = / > 19/24   Baseline 11/24 without AD   Time 4   Period Weeks   Status New     PT SHORT TERM GOAL #4   Title Pt will negotiate 16 stairs with one rail and alternating sequence Mod I without catching/dragging L foot when ascending or descending   Time 4   Period Weeks   Status New           PT Long Term Goals - 08/21/16 2049      PT LONG TERM GOAL #1   Title (TARGET DATE FOR ALL LTG 10/20/2016)  Pt will  demonstrate independence with balance/strengthening HEP   Time 8   Period Weeks   Status New     PT LONG TERM GOAL #2   Title Pt will improve gait velocity to >3.47ft/sec without AD   Time  8   Period Weeks   Status New     PT LONG TERM GOAL #3   Title Pt will decrease falls risk during gait by improving DGI score to >22/24 without AD   Time 8   Period Weeks   Status New     PT LONG TERM GOAL #4   Title Pt will ambulate >1,000 outside over uneven grassy surfaces while hitting whiffle golf ball x 10 times without LOB   Time 8   Period Weeks   Status New     PT LONG TERM GOAL #5   Title Pt will report recovery from CVA at >85% on Stroke Impace Scale   Baseline 72%   Time 8   Period Weeks   Status New               Plan - 09/04/16 2956    Clinical Impression Statement Pt continues to not feel well but has been started on BP medication, BP slightly elevated today but pt reporting constant neuropathic pain in bilat LE and discomfort in shoulders and hips from bursitis and RA.  Treatment session focused on dynamic balance activities on compliant surfaces outside today and dynamic balance simulating golf swing with weighted ball to train weight shifting, momentum control and balance with head turns.  Pt tolerated well but did require frequent sitting rest breaks due to SOB and pain in feet.   Rehab Potential Good   Clinical Impairments Affecting Rehab Potential Extensive medical history: RA with chronic prednisone use, GI bleed with partial colectomy, prostate CA, bilateral TKA, Spondylolisthesis at L4-L5, HTN, R MCA CVA, Hiatal hernia, recent lumbar decompression for stenosis   PT Frequency 2x / week   PT Duration 8 weeks   PT Treatment/Interventions ADLs/Self Care Home Management;DME Instruction;Gait training;Stair training;Functional mobility training;Therapeutic activities;Therapeutic exercise;Balance training;Neuromuscular re-education;Patient/family education;Orthotic  Fit/Training;Taping   PT Next Visit Plan check BP initially. keep on high level balance especially on compliant surfaces, control with weight shifting, LLE NMR, and pt to bring a few golf clubs to practice swinging in the grass.     PT Home Exercise Plan Incorporate walking outside, balance with golfing activities on grass   Consulted and Agree with Plan of Care Patient      Patient will benefit from skilled therapeutic intervention in order to improve the following deficits and impairments:  Abnormal gait, Decreased balance, Difficulty walking, Decreased strength, Other (comment)  Visit Diagnosis: Hemiplegia and hemiparesis following cerebral infarction affecting left non-dominant side (HCC)  Muscle weakness (generalized)  Other abnormalities of gait and mobility  Unsteadiness on feet     Problem List Patient Active Problem List   Diagnosis Date Noted  . Diabetic neuropathy associated with type 2 diabetes mellitus ([place]) 08/29/2016  . Embolic stroke of right basal ganglia ([place]) 08/16/2016  . 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 Gait disturbance, post-stroke 08/13/2016  . Left middle cerebral artery stroke ([place]) 08/11/2016  . Acute ischemic stroke ([place]) 08/10/2016  . Hypertensive urgency 08/10/2016  . Right middle cerebral artery stroke ([place]) 08/10/2016  . Acute left-sided weakness   . Left-sided weakness   . History of lower GI bleeding   . Hiatal hernia   . 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 Acute post-hemorrhagic anemia 02/03/2013  . Rheumatoid arthritis ([place]) 04/14/2011  . Long-term current use of steroids 04/14/2011  . Iron deficiency anemia 04/14/2011   Raylene Everts, PT, DPT  09/04/16    9:38 AM    [facility] [address] [place], [place], [address] Phone: [phone]   Fax:   [phone]  Name: Aniello F [name] MRN: [id number] Date of Birth: 05/21/1940

## 2016-09-07 ENCOUNTER — Ambulatory Visit: Payer: PPO | Admitting: Physical Therapy

## 2016-09-08 DIAGNOSIS — Z1389 Encounter for screening for other disorder: Secondary | ICD-10-CM | POA: Diagnosis not present

## 2016-09-08 DIAGNOSIS — M810 Age-related osteoporosis without current pathological fracture: Secondary | ICD-10-CM | POA: Diagnosis not present

## 2016-09-08 DIAGNOSIS — K219 Gastro-esophageal reflux disease without esophagitis: Secondary | ICD-10-CM | POA: Diagnosis not present

## 2016-09-08 DIAGNOSIS — D472 Monoclonal gammopathy: Secondary | ICD-10-CM | POA: Diagnosis not present

## 2016-09-08 DIAGNOSIS — Z79899 Other long term (current) drug therapy: Secondary | ICD-10-CM | POA: Diagnosis not present

## 2016-09-08 DIAGNOSIS — I639 Cerebral infarction, unspecified: Secondary | ICD-10-CM | POA: Diagnosis not present

## 2016-09-08 DIAGNOSIS — I10 Essential (primary) hypertension: Secondary | ICD-10-CM | POA: Diagnosis not present

## 2016-09-08 DIAGNOSIS — M069 Rheumatoid arthritis, unspecified: Secondary | ICD-10-CM | POA: Diagnosis not present

## 2016-09-08 DIAGNOSIS — Z0000 Encounter for general adult medical examination without abnormal findings: Secondary | ICD-10-CM | POA: Diagnosis not present

## 2016-09-12 ENCOUNTER — Encounter: Payer: Self-pay | Admitting: Physical Therapy

## 2016-09-12 ENCOUNTER — Ambulatory Visit: Payer: PPO | Admitting: Physical Therapy

## 2016-09-12 VITALS — BP 135/77

## 2016-09-12 DIAGNOSIS — R2689 Other abnormalities of gait and mobility: Secondary | ICD-10-CM

## 2016-09-12 DIAGNOSIS — R2681 Unsteadiness on feet: Secondary | ICD-10-CM

## 2016-09-12 DIAGNOSIS — M6281 Muscle weakness (generalized): Secondary | ICD-10-CM

## 2016-09-12 DIAGNOSIS — I69354 Hemiplegia and hemiparesis following cerebral infarction affecting left non-dominant side: Secondary | ICD-10-CM

## 2016-09-13 NOTE — Therapy (Signed)
[facility] [address] [place], [place], [address] Phone: [phone]   Fax:  [phone]  Physical Therapy Treatment  Patient Details  Name: [name] MRN: [id number] Date of Birth: 05/08/1940 Referring Provider: Dr. Alysia Penna  Encounter Date: 09/12/2016      PT End of Session - 09/12/16 0854    Visit Number 5   Number of Visits 17   Date for PT Re-Evaluation 10/20/16   Authorization Type [employer]-G code and 10th visit progress note   PT Start Time 0850   PT Stop Time 0930   PT Time Calculation (min) 40 min   Activity Tolerance Patient tolerated treatment well;No increased pain  neuropathic pain   Behavior During Therapy WFL for tasks assessed/performed      Past Medical History:  Diagnosis Date  . Blood transfusion    " no reaction from transfusion"  . BPH (benign prostatic hyperplasia)   . Bruises easily   . Bursitis    chronic hip pain  . Cancer [facility])    prostate cancer cells  . Disc disease, degenerative, lumbar or lumbosacral    Chronic back pain now  . Diverticulitis   . Esophageal ring    requiring dilations   . GERD (gastroesophageal reflux disease)   . History of GI bleed    "vessels burst in colon" x2  . History of kidney stones    chronic  . History of vertigo   . Osteoarthritis   . Osteoporosis   . Pneumonia    yrs ago  . Rash    GROIN - ITCHING PAST 3 MONTHS - it is gone now 05/02/16  . Rheumatoid arthritis(714.0)    27 years  . Spondylisthesis   . Ureteral stone 11/2012    Past Surgical History:  Procedure Laterality Date  . BALLOON DILATION N/A 10/01/2012   Procedure: BALLOON DILATION;  Surgeon: Garlan Fair, MD;  Location: [facility];  Service: Endoscopy;  Laterality: N/A;  . COLECTOMY N/A 06/10/2013   Procedure: TOTAL abdominal COLECTOMY;  Surgeon: Odis Hollingshead, MD;  Location: [facility];  Service: General;  Laterality: N/A;  .  COLONOSCOPY    . CYSTOSCOPY WITH URETEROSCOPY Right 12/05/2012   Procedure: CYSTOSCOPY WITH RIGHT URETEROSCOPY AND STONE EXTRACTION;  Surgeon: Malka So, MD;  Location: [facility];  Service: Urology;  Laterality: Right;  . ESOPHAGOGASTRODUODENOSCOPY N/A 10/01/2012   Procedure: ESOPHAGOGASTRODUODENOSCOPY (EGD);  Surgeon: Garlan Fair, MD;  Location: [facility];  Service: Endoscopy;  Laterality: N/A;  . HERNIA REPAIR    . INGUINAL HERNIA REPAIR  2007   Bilateral   . Lithotripsy for nephrolithiasis     Pt and wife not sure when but here in [place]  . Lysis of Adhesion, small bowel resection  2009  . MAXIMUM ACCESS (MAS)POSTERIOR LUMBAR INTERBODY FUSION (PLIF) 1 LEVEL N/A 05/08/2016   Procedure: LUMBAR FOUR-FIVE FOR MAXIMUM ACCESS (MAS) POSTERIOR LUMBAR INTERBODY FUSION;  Surgeon: Kary Kos, MD;  Location: [facility];  Service: Neurosurgery;  Laterality: N/A;  . NISSEN FUNDOPLICATION     about 8250, pt not sure records not currently available  . TOTAL KNEE ARTHROPLASTY  2003   Bilateral  . TRANSURETHRAL PROSTATECTOMY WITH GYRUS INSTRUMENTS N/A 12/05/2012   Procedure: TRANSURETHRAL PROSTATECTOMY WITH GYRUS INSTRUMENTS;  Surgeon: Malka So, MD;  [facility];  Service: Urology;  Laterality: N/A;  . Ventral Hernia Repair with Mesh      Vitals:   09/12/16 5397  BP: 135/77        Subjective Assessment - 09/12/16 0852    Subjective No new complaints. Continues to report severe pain in bil legs due to neuropathy. Reports it gets worse as the day goes on with activity.    Patient is accompained by: Family member   Pertinent History RA, GI bleed with colectomy, prostate CA, bilat TKA, Spondylolisthesis at L4-L5, HTN, hiatal hernia, lumbar decompression for stenosis   Limitations Walking   Patient Stated Goals Make the LLE stronger   Currently in Pain? Yes   Pain Score 6    Pain Location Foot   Pain Orientation Right;Left   Pain Descriptors / Indicators Throbbing;Pins and  needles;Burning   Pain Type Neuropathic pain   Pain Onset More than a month ago   Pain Frequency Intermittent   Aggravating Factors  increased activity   Pain Relieving Factors rest, elevating feet.             [facility] Adult PT Treatment/Exercise - 09/12/16 0856      Transfers   Transfers Sit to Stand;Stand to Sit     Ambulation/Gait   Ambulation/Gait Yes   Ambulation/Gait Assistance 4: Min guard   Assistive device None   Gait Pattern Step-through pattern;Decreased stance time - left;Decreased dorsiflexion - left;Decreased weight shift to right;Wide base of support;Poor foot clearance - left   Ambulation Surface Level;Indoor   Gait Comments gait along ~50 foot hallway: forward gait with head movements up<>down and left<>right x 4 laps each with min guard to min assit for balance.               High Level Balance   High Level Balance Activities Marching forwards;Marching backwards;Tandem walking  tandem fwd/bwd, heel walk fwd/bwd, toe walk fwd/bwd x1 only   High Level Balance Comments on red mats next to counter top with no UE support: 3 laps each with min guard to min assist for balance, cues on ex form/posture           Balance Exercises - 09/12/16 0916      Balance Exercises: Standing   SLS with Vectors Foam/compliant surface;Other reps (comment);Limitations     Balance Exercises: Standing   SLS with Vectors Limitations 6 cones along edges of both red mats: single toe tap to each with side stepping, double toe tap to each with side stepping, flipping over/up each cone with both LE's with side stepping x 1 lap each/each way. min guard to min assist for balance with intermittent touch to counter top for balance.                                     PT Short Term Goals - 08/21/16 2037      PT SHORT TERM GOAL #1   Title (TARGET DATE FOR ALL STG 09/20/16)  Pt will improve LE functional strength as indicated by 5 times sit to stand =/< 12 seconds   Baseline 14 seconds  without UE from mat   Time 4   Period Weeks   Status New     PT SHORT TERM GOAL #2   Title Pt will improve safety with community gait as indicated by increase in gait velocity to = / > 3.28 ft/sec   Baseline 2.76 ft/sec   Time 4   Period Weeks   Status New     PT SHORT TERM GOAL #3   Title Pt  will decrease falls risk during gait as indicated by improvement in DGI to = / > 19/24   Baseline 11/24 without AD   Time 4   Period Weeks   Status New     PT SHORT TERM GOAL #4   Title Pt will negotiate 16 stairs with one rail and alternating sequence Mod I without catching/dragging L foot when ascending or descending   Time 4   Period Weeks   Status New           PT Long Term Goals - 08/21/16 2049      PT LONG TERM GOAL #1   Title (TARGET DATE FOR ALL LTG 10/20/2016)  Pt will demonstrate independence with balance/strengthening HEP   Time 8   Period Weeks   Status New     PT LONG TERM GOAL #2   Title Pt will improve gait velocity to >3.53ft/sec without AD   Time 8   Period Weeks   Status New     PT LONG TERM GOAL #3   Title Pt will decrease falls risk during gait by improving DGI score to >22/24 without AD   Time 8   Period Weeks   Status New     PT LONG TERM GOAL #4   Title Pt will ambulate >1,000 outside over uneven grassy surfaces while hitting whiffle golf ball x 10 times without LOB   Time 8   Period Weeks   Status New     PT LONG TERM GOAL #5   Title Pt will report recovery from CVA at >85% on Stroke Impace Scale   Baseline 72%   Time 8   Period Weeks   Status New               Plan - 09/12/16 9390    Clinical Impression Statement Today's skilled session focused on high level balance activities with no issues/incr in LE pain reported. Pt is making steady progress toward goals and should benefit from continued PT to progress toward unmet goals.    Rehab Potential Good   Clinical Impairments Affecting Rehab Potential Extensive medical history: RA with  chronic prednisone use, GI bleed with partial colectomy, prostate CA, bilateral TKA, Spondylolisthesis at L4-L5, HTN, R MCA CVA, Hiatal hernia, recent lumbar decompression for stenosis   PT Frequency 2x / week   PT Duration 8 weeks   PT Treatment/Interventions ADLs/Self Care Home Management;DME Instruction;Gait training;Stair training;Functional mobility training;Therapeutic activities;Therapeutic exercise;Balance training;Neuromuscular re-education;Patient/family education;Orthotic Fit/Training;Taping   PT Next Visit Plan check BP initially. STGs due by 09/20/16. keep on high level balance especially on compliant surfaces, control with weight shifting, LLE NMR, and pt to bring a few golf clubs to practice swinging in the grass.     PT Home Exercise Plan Incorporate walking outside, balance with golfing activities on grass   Consulted and Agree with Plan of Care Patient      Patient will benefit from skilled therapeutic intervention in order to improve the following deficits and impairments:  Abnormal gait, Decreased balance, Difficulty walking, Decreased strength, Other (comment)  Visit Diagnosis: Hemiplegia and hemiparesis following cerebral infarction affecting left non-dominant side (HCC)  Muscle weakness (generalized)  Other abnormalities of gait and mobility  Unsteadiness on feet     Problem List Patient Active Problem List   Diagnosis Date Noted  . Diabetic neuropathy associated with type 2 diabetes mellitus ([place]) 08/29/2016  . Embolic stroke of right basal ganglia ([place]) 08/16/2016  . 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  Gait disturbance, post-stroke 08/13/2016  . Left middle cerebral artery stroke ([place]) 08/11/2016  . Acute ischemic stroke ([place]) 08/10/2016  . Hypertensive urgency 08/10/2016  . Right middle cerebral artery stroke ([place]) 08/10/2016  . Acute left-sided weakness   . Left-sided weakness   . History of lower GI bleeding   . Hiatal hernia   . Spondylolisthesis at  L4-L5 level 05/08/2016  . 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 Acute post-hemorrhagic anemia 02/03/2013  . Rheumatoid arthritis ([place]) 04/14/2011  . Long-term current use of steroids 04/14/2011  . Iron deficiency anemia 04/14/2011    Willow Ora, PTA, [facility] Neuro [facility] [address], [place], [place] [address] [phone] 09/13/16, 10:20 AM   Name: [name] MRN: [id number] Date of Birth: [birth year]

## 2016-09-14 ENCOUNTER — Ambulatory Visit: Payer: PPO | Admitting: Physical Therapy

## 2016-09-14 ENCOUNTER — Encounter: Payer: Self-pay | Admitting: Physical Therapy

## 2016-09-14 VITALS — BP 153/82 | HR 63

## 2016-09-14 DIAGNOSIS — R2681 Unsteadiness on feet: Secondary | ICD-10-CM

## 2016-09-14 DIAGNOSIS — M6281 Muscle weakness (generalized): Secondary | ICD-10-CM

## 2016-09-14 DIAGNOSIS — I69354 Hemiplegia and hemiparesis following cerebral infarction affecting left non-dominant side: Secondary | ICD-10-CM

## 2016-09-14 DIAGNOSIS — R2689 Other abnormalities of gait and mobility: Secondary | ICD-10-CM

## 2016-09-14 NOTE — Patient Instructions (Signed)
Gaze Stabilization: Sitting    Keeping eyes on target on wall 3 feet away, and move head side to side for _30__ seconds. Repeat while moving head up and down for __30_ seconds. Do __2-3__ sessions per day.  Copyright  VHI. All rights reserved.   Gaze Stabilization: Tip Card  1.Target must remain in focus, not blurry, and appear stationary while head is in motion. 2.Perform exercises with small head movements (45 to either side of midline). 3.Increase speed of head motion so long as target is in focus. 4.If you wear eyeglasses, be sure you can see target through lens (therapist will give specific instructions for bifocal / progressive lenses). 5.These exercises may provoke dizziness or nausea. Work through these symptoms. If too dizzy, slow head movement slightly. Rest between each exercise. 6.Exercises demand concentration; avoid distractions.  Copyright  VHI. All rights reserved.     

## 2016-09-14 NOTE — Therapy (Signed)
[facility] [address] [place], [place], [address] Phone: [phone]   Fax:  [phone]  Physical Therapy Treatment  Patient Details  Name: [name] MRN: [id number] Date of Birth: [birth year] Referring Provider: Dr. Alysia Penna  Encounter Date: 09/14/2016      PT End of Session - 09/14/16 0946    Visit Number 6   Number of Visits 17   Date for PT Re-Evaluation 10/20/16   Authorization Type [employer]-G code and 10th visit progress note   PT Start Time 0850   PT Stop Time 0935   PT Time Calculation (min) 45 min   Activity Tolerance Patient limited by fatigue  neuropathic pain   Behavior During Therapy [facility] for tasks assessed/performed      Past Medical History:  Diagnosis Date  . Blood transfusion    " no reaction from transfusion"  . BPH (benign prostatic hyperplasia)   . Bruises easily   . Bursitis    chronic hip pain  . Cancer [facility])    prostate cancer cells  . Disc disease, degenerative, lumbar or lumbosacral    Chronic back pain now  . Diverticulitis   . Esophageal ring    requiring dilations   . GERD (gastroesophageal reflux disease)   . History of GI bleed    "vessels burst in colon" x2  . History of kidney stones    chronic  . History of vertigo   . Osteoarthritis   . Osteoporosis   . Pneumonia    yrs ago  . Rash    GROIN - ITCHING PAST 3 MONTHS - it is gone now 05/02/16  . Rheumatoid arthritis(714.0)    27 years  . Spondylisthesis   . Ureteral stone 11/2012    Past Surgical History:  Procedure Laterality Date  . BALLOON DILATION N/A 10/01/2012   Procedure: BALLOON DILATION;  Surgeon: Garlan Fair, MD;  Location: [facility];  Service: Endoscopy;  Laterality: N/A;  . COLECTOMY N/A 06/10/2013   Procedure: TOTAL abdominal COLECTOMY;  Surgeon: Odis Hollingshead, MD;  [facility];  Service: General;  Laterality: N/A;  . COLONOSCOPY    . CYSTOSCOPY  WITH URETEROSCOPY Right 12/05/2012   Procedure: CYSTOSCOPY WITH RIGHT URETEROSCOPY AND STONE EXTRACTION;  Surgeon: Malka So, MD;  [facility];  Service: Urology;  Laterality: Right;  . ESOPHAGOGASTRODUODENOSCOPY N/A 10/01/2012   Procedure: ESOPHAGOGASTRODUODENOSCOPY (EGD);  Surgeon: Garlan Fair, MD;  Location: [facility];  Service: Endoscopy;  Laterality: N/A;  . HERNIA REPAIR    . INGUINAL HERNIA REPAIR  2007   Bilateral   . Lithotripsy for nephrolithiasis     Pt and wife not sure when but here in [place]  . Lysis of Adhesion, small bowel resection  2009  . MAXIMUM ACCESS (MAS)POSTERIOR LUMBAR INTERBODY FUSION (PLIF) 1 LEVEL N/A 05/08/2016   Procedure: LUMBAR FOUR-FIVE FOR MAXIMUM ACCESS (MAS) POSTERIOR LUMBAR INTERBODY FUSION;  Surgeon: Kary Kos, MD;  Location: [facility];  Service: Neurosurgery;  Laterality: N/A;  . NISSEN FUNDOPLICATION     about 5188, pt not sure records not currently available  . TOTAL KNEE ARTHROPLASTY  2003   Bilateral  . TRANSURETHRAL PROSTATECTOMY WITH GYRUS INSTRUMENTS N/A 12/05/2012   Procedure: TRANSURETHRAL PROSTATECTOMY WITH GYRUS INSTRUMENTS;  Surgeon: Malka So, MD;  [facility];  Service: Urology;  Laterality: N/A;  . Ventral Hernia Repair with Mesh      Vitals:   09/14/16 4166 09/14/16 0920  BP: 140/84 (!) 153/82  Pulse: 65 63        Subjective Assessment - 09/14/16 0855    Subjective Pt feeling very weak today; feels like LLE is dragging more than normal.  Reports having some inner ear issues yesterday and today, running into walls and feeling off balance/dizzy.   Patient is accompained by: Family member   Pertinent History RA, GI bleed with colectomy, prostate CA, bilat TKA, Spondylolisthesis at L4-L5, HTN, hiatal hernia, lumbar decompression for stenosis   Limitations Walking   Patient Stated Goals Make the LLE stronger   Currently in Pain? No/denies                Vestibular Assessment - 09/14/16 0901       Vestibular Assessment   General Observation feeling weaker today, reports running into walls.  Does report having a history of migraines with visual disturbances.      Symptom Behavior   Type of Dizziness Comment  weak, off balance, swimmy headed   Frequency of Dizziness intermittent, monthly   Duration of Dizziness couple of days   Aggravating Factors Spontaneous onset  narrow hallways   Relieving Factors Comments  resolves spontaneously     Occulomotor Exam   Occulomotor Alignment Normal   Spontaneous Absent   Gaze-induced Absent   Smooth Pursuits Intact   Saccades Intact   Comment convergence intact     Vestibulo-Occular Reflex   VOR to Slow Head Movement Normal   Comment HIT: + to L     Visual Acuity   Static 5   Dynamic 2  3 line difference     Positional Testing   Dix-Hallpike Dix-Hallpike Right;Dix-Hallpike Left   Horizontal Canal Testing Horizontal Canal Right;Horizontal Canal Left     Dix-Hallpike Right   Dix-Hallpike Right Duration 0   Dix-Hallpike Right Symptoms No nystagmus     Dix-Hallpike Left   Dix-Hallpike Left Duration 0   Dix-Hallpike Left Symptoms No nystagmus     Horizontal Canal Right   Horizontal Canal Right Duration 0   Horizontal Canal Right Symptoms Normal     Horizontal Canal Left   Horizontal Canal Left Duration 0   Horizontal Canal Left Symptoms Normal                  Vestibular Treatment/Exercise - 09/14/16 1017      Vestibular Treatment/Exercise   Vestibular Treatment Provided Gaze   Gaze Exercises X1 Viewing Horizontal;X1 Viewing Vertical     X1 Viewing Horizontal   Foot Position seated   Reps 2   Comments 30 seconds     X1 Viewing Vertical   Foot Position seated   Reps 2   Comments 30 seconds      Gaze Stabilization: Sitting    Keeping eyes on target on wall 3 feet away, and move head side to side for _30___ seconds. Repeat while moving head up and down for __30__ seconds. Do __2-3__ sessions per  day.  Copyright  VHI. All rights reserved.   Gaze Stabilization: Tip Card  1.Target must remain in focus, not blurry, and appear stationary while head is in motion. 2.Perform exercises with small head movements (45 to either side of midline). 3.Increase speed of head motion so long as target is in focus. 4.If you wear eyeglasses, be sure you can see target through lens (therapist will give specific instructions for bifocal / progressive lenses). 5.These exercises may provoke dizziness or nausea. Work through these symptoms. If too dizzy, slow  head movement slightly. Rest between each exercise. 6.Exercises demand concentration; avoid distractions.           PT Education - 09/14/16 0931    Education provided Yes   Education Details education on vestibular hypofunction and brain's ability to compensate, gaze stability/adaptation   Person(s) Educated Patient   Methods Explanation;Handout;Demonstration   Comprehension Verbalized understanding;Returned demonstration          PT Short Term Goals - 08/21/16 2037      PT SHORT TERM GOAL #1   Title (TARGET DATE FOR ALL STG 09/20/16)  Pt will improve LE functional strength as indicated by 5 times sit to stand =/< 12 seconds   Baseline 14 seconds without UE from mat   Time 4   Period Weeks   Status New     PT SHORT TERM GOAL #2   Title Pt will improve safety with community gait as indicated by increase in gait velocity to = / > 3.28 ft/sec   Baseline 2.76 ft/sec   Time 4   Period Weeks   Status New     PT SHORT TERM GOAL #3   Title Pt will decrease falls risk during gait as indicated by improvement in DGI to = / > 19/24   Baseline 11/24 without AD   Time 4   Period Weeks   Status New     PT SHORT TERM GOAL #4   Title Pt will negotiate 16 stairs with one rail and alternating sequence Mod I without catching/dragging L foot when ascending or descending   Time 4   Period Weeks   Status New           PT Long Term Goals  - 08/21/16 2049      PT LONG TERM GOAL #1   Title (TARGET DATE FOR ALL LTG 10/20/2016)  Pt will demonstrate independence with balance/strengthening HEP   Time 8   Period Weeks   Status New     PT LONG TERM GOAL #2   Title Pt will improve gait velocity to >3.19ft/sec without AD   Time 8   Period Weeks   Status New     PT LONG TERM GOAL #3   Title Pt will decrease falls risk during gait by improving DGI score to >22/24 without AD   Time 8   Period Weeks   Status New     PT LONG TERM GOAL #4   Title Pt will ambulate >1,000 outside over uneven grassy surfaces while hitting whiffle golf ball x 10 times without LOB   Time 8   Period Weeks   Status New     PT LONG TERM GOAL #5   Title Pt will report recovery from CVA at >85% on Stroke Impace Scale   Baseline 72%   Time 8   Period Weeks   Status New               Plan - 09/14/16 0947    Clinical Impression Statement Due to pt reporting significant dizziness and disequilibrium today and h/o inner ear issues performed assessment of inner ear to determine if symptoms are peripheral or central in nature.  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  Pt does present with impaired gaze stability; provided pt with gaze adaptation HEP.  Will continue to address in combination with balance, endurance and gait training.   Rehab Potential Good   Clinical Impairments Affecting Rehab Potential Extensive medical history: RA with chronic prednisone use, GI bleed with partial colectomy, prostate CA, bilateral TKA, Spondylolisthesis at L4-L5, HTN, R MCA CVA, Hiatal hernia, recent lumbar decompression for  stenosis   PT Frequency 2x / week   PT Treatment/Interventions ADLs/Self Care Home Management;DME Instruction;Gait training;Stair training;Functional mobility training;Therapeutic activities;Therapeutic exercise;Balance training;Neuromuscular re-education;Patient/family education;Orthotic Fit/Training;Taping   PT Next Visit Plan check BP initially. STGs due by 09/20/16. review gaze adaptation; add corner balance; keep on high level balance especially on compliant surfaces, control with weight shifting, LLE NMR, and pt to bring a few golf clubs to practice swinging in the grass.     PT Home Exercise Plan Incorporate walking outside, balance with golfing activities on grass   Consulted and Agree with Plan of Care Patient      Patient will benefit from skilled therapeutic intervention in order to improve the following deficits and impairments:  Abnormal gait, Decreased balance, Difficulty walking, Decreased strength, Other (comment)  Visit Diagnosis: Hemiplegia and hemiparesis following cerebral infarction affecting left non-dominant side (HCC)  Muscle weakness (generalized)  Other abnormalities of gait and mobility  Unsteadiness on feet     Problem List Patient Active Problem List   Diagnosis Date Noted  . Diabetic neuropathy associated with type 2 diabetes mellitus ([place]) 08/29/2016  . Embolic stroke of right basal ganglia ([place]) 08/16/2016  . 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 Gait disturbance, post-stroke 08/13/2016  . Left middle cerebral artery stroke ([place]) 08/11/2016  . Acute ischemic stroke ([place]) 08/10/2016  . Hypertensive urgency 08/10/2016  . Right middle cerebral artery stroke ([place]) 08/10/2016  . Acute left-sided weakness   . Left-sided weakness   . History of lower GI bleeding   . Hiatal hernia   . 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 Acute post-hemorrhagic  anemia 02/03/2013  . Rheumatoid arthritis ([place]) 04/14/2011  . Long-term current use of steroids 04/14/2011  . Iron deficiency anemia 04/14/2011   Raylene Everts, PT, DPT 09/14/16    9:53 AM    [facility] [address] [place], [place], [address] Phone: [phone]   Fax:  [phone]  Name: Kimarion F [name] MRN: [id number] Date of Birth: 19-Jul-1940

## 2016-09-18 ENCOUNTER — Encounter: Payer: Self-pay | Admitting: Rehabilitation

## 2016-09-18 ENCOUNTER — Ambulatory Visit: Payer: PPO | Attending: Physical Medicine & Rehabilitation | Admitting: Rehabilitation

## 2016-09-18 VITALS — BP 145/80

## 2016-09-18 DIAGNOSIS — R2681 Unsteadiness on feet: Secondary | ICD-10-CM

## 2016-09-18 DIAGNOSIS — I69354 Hemiplegia and hemiparesis following cerebral infarction affecting left non-dominant side: Secondary | ICD-10-CM

## 2016-09-18 DIAGNOSIS — M6281 Muscle weakness (generalized): Secondary | ICD-10-CM

## 2016-09-18 DIAGNOSIS — R2689 Other abnormalities of gait and mobility: Secondary | ICD-10-CM

## 2016-09-18 NOTE — Patient Instructions (Addendum)
   Gaze Stabilization: Sitting    Keeping eyes on target on wall 3 feet away, and move head side to side for _30___ seconds. Repeat while moving head up and down for __30__ seconds. Do __2-3__ sessions per day.  Copyright  VHI. All rights reserved.   Tandem Walking    Along the countertop, walk with each foot directly in front of other, heel of one foot touching toes of other foot with each step. Both feet straight ahead.  Walk forward to end of counter, then backwards.  Repeat x 3 reps forwards and backwards.     Copyright  VHI. All rights reserved.   "I love a Engineer, mining top for support if needed,  march forwards to end of counter, then backwards.  Do this slowly and with as little support as possible. Repeat x 3 laps down and back.    http://gt2.exer.us/344   Copyright  VHI. All rights reserved.   SINGLE LIMB STANCE    Stance: single leg on floor. Raise leg. Hold _15__ seconds. Repeat with other leg. __3_ reps per set, __1-2_ sets per day, _5-7__ days per week  Copyright  VHI. All rights reserved.     Do these standing in corner with a chair in front of you for support.    Feet Apart (Compliant Surface) Varied Arm Positions - Eyes Closed    Stand on compliant surface: ___pillow____ with feet shoulder width apart and arms out. Close eyes and visualize upright position. Hold_30___ seconds. Repeat __3__ times per session. Do __2__ sessions per day.  Copyright  VHI. All rights reserved.   Feet Together (Compliant Surface) Head Motion - Eyes Open    With eyes open, standing on compliant surface: ___pillow_____, feet together, move head slowly: up and down x 10 reps, and side to side x 10 reps.  Do __2__ sessions per day.  Copyright  VHI. All rights reserved.

## 2016-09-18 NOTE — Therapy (Signed)
[facility] [address] [place], [place], [address] Phone: [phone]   Fax:  [phone]  Physical Therapy Treatment  Patient Details  Name: [name] MRN: [id number] Date of Birth: 03-21-1941 Referring Provider: Dr. Alysia Penna  Encounter Date: 09/18/2016      PT End of Session - 09/18/16 0855    Visit Number 7   Number of Visits 17   Date for PT Re-Evaluation 10/20/16   Authorization Type [employer]-G code and 10th visit progress note   PT Start Time 0849   PT Stop Time 0931   PT Time Calculation (min) 42 min   Activity Tolerance Patient limited by fatigue  neuropathic pain   Behavior During Therapy [facility] for tasks assessed/performed      Past Medical History:  Diagnosis Date  . Blood transfusion    " no reaction from transfusion"  . BPH (benign prostatic hyperplasia)   . Bruises easily   . Bursitis    chronic hip pain  . Cancer [facility])    prostate cancer cells  . Disc disease, degenerative, lumbar or lumbosacral    Chronic back pain now  . Diverticulitis   . Esophageal ring    requiring dilations   . GERD (gastroesophageal reflux disease)   . History of GI bleed    "vessels burst in colon" x2  . History of kidney stones    chronic  . History of vertigo   . Osteoarthritis   . Osteoporosis   . Pneumonia    yrs ago  . Rash    GROIN - ITCHING PAST 3 MONTHS - it is gone now 05/02/16  . Rheumatoid arthritis(714.0)    27 years  . Spondylisthesis   . Ureteral stone 11/2012    Past Surgical History:  Procedure Laterality Date  . BALLOON DILATION N/A 10/01/2012   Procedure: BALLOON DILATION;  Surgeon: Garlan Fair, MD;  Location: [facility];  Service: Endoscopy;  Laterality: N/A;  . COLECTOMY N/A 06/10/2013   Procedure: TOTAL abdominal COLECTOMY;  Surgeon: Odis Hollingshead, MD;  Location: [facility];  Service: General;  Laterality: N/A;  . COLONOSCOPY    . CYSTOSCOPY  WITH URETEROSCOPY Right 12/05/2012   Procedure: CYSTOSCOPY WITH RIGHT URETEROSCOPY AND STONE EXTRACTION;  Surgeon: Malka So, MD;  Location: [facility];  Service: Urology;  Laterality: Right;  . ESOPHAGOGASTRODUODENOSCOPY N/A 10/01/2012   Procedure: ESOPHAGOGASTRODUODENOSCOPY (EGD);  Surgeon: Garlan Fair, MD;  Location: [facility];  Service: Endoscopy;  Laterality: N/A;  . HERNIA REPAIR    . INGUINAL HERNIA REPAIR  2007   Bilateral   . Lithotripsy for nephrolithiasis     Pt and wife not sure when but here in [place]  . Lysis of Adhesion, small bowel resection  2009  . MAXIMUM ACCESS (MAS)POSTERIOR LUMBAR INTERBODY FUSION (PLIF) 1 LEVEL N/A 05/08/2016   Procedure: LUMBAR FOUR-FIVE FOR MAXIMUM ACCESS (MAS) POSTERIOR LUMBAR INTERBODY FUSION;  Surgeon: Kary Kos, MD;  Location: [facility];  Service: Neurosurgery;  Laterality: N/A;  . NISSEN FUNDOPLICATION     about 6333, pt not sure records not currently available  . TOTAL KNEE ARTHROPLASTY  2003   Bilateral  . TRANSURETHRAL PROSTATECTOMY WITH GYRUS INSTRUMENTS N/A 12/05/2012   Procedure: TRANSURETHRAL PROSTATECTOMY WITH GYRUS INSTRUMENTS;  Surgeon: Malka So, MD;  Location: [facility];  Service: Urology;  Laterality: N/A;  . Ventral Hernia Repair with Mesh      Vitals:   09/18/16 1058  BP: Marland Kitchen)  145/80        Subjective Assessment - 09/18/16 0854    Subjective Pt feels weak today, wobbly over the weekend.    Pertinent History RA, GI bleed with colectomy, prostate CA, bilat TKA, Spondylolisthesis at L4-L5, HTN, hiatal hernia, lumbar decompression for stenosis   Limitations Walking   Patient Stated Goals Make the LLE stronger   Currently in Pain? Yes   Pain Score 2    Pain Location Foot   Pain Orientation Right;Left   Pain Descriptors / Indicators Numbness;Pins and needles   Pain Type Neuropathic pain   Pain Onset More than a month ago   Pain Frequency Intermittent   Aggravating Factors  increased activity   Pain Relieving Factors  rest, elevating feet                          [facility] Adult PT Treatment/Exercise - 09/18/16 0903      Transfers   Five time sit to stand comments  11.04 secs without UE support from mat      Ambulation/Gait   Gait velocity 3.56 ft/sec without AD   Stairs Yes   Stairs Assistance 6: Modified independent (Device/Increase time)   Stairs Assistance Details (indicate cue type and reason) Pt able to ascend without support, but needs single rail when descending.     Stair Management Technique One rail Right;Alternating pattern;Forwards   Number of Stairs 16   Height of Stairs 6     Standardized Balance Assessment   Standardized Balance Assessment Dynamic Gait Index     Dynamic Gait Index   Level Surface Normal   Change in Gait Speed Normal   Gait with Horizontal Head Turns Moderate Impairment   Gait with Vertical Head Turns Mild Impairment   Gait and Pivot Turn Normal   Step Over Obstacle Mild Impairment   Step Around Obstacles Mild Impairment   Steps Mild Impairment   Total Score 18     Neuro Re-ed    Neuro Re-ed Details  Went over current HEP, see pt instruction for details.  Note these are still moderate in difficulty, and requires some cues for technique, therefore did not adjust HEP at this time.                 PT Education - 09/18/16 0855    Education provided Yes   Education Details Education on continued performance with current HEP   Person(s) Educated Patient   Methods Explanation   Comprehension Verbalized understanding          PT Short Term Goals - 09/18/16 0858      PT SHORT TERM GOAL #1   Title (TARGET DATE FOR ALL STG 09/20/16)  Pt will improve LE functional strength as indicated by 5 times sit to stand =/< 12 seconds   Baseline 11.03 secs without UE support from mat 09/18/16   Time 4   Period Weeks   Status Achieved     PT SHORT TERM GOAL #2   Title Pt will improve safety with community gait as indicated by increase in gait  velocity to = / > 3.28 ft/sec   Baseline 3.56 ft/sec on 09/18/16   Time 4   Period Weeks   Status Achieved     PT SHORT TERM GOAL #3   Title Pt will decrease falls risk during gait as indicated by improvement in DGI to = / > 19/24   Baseline 18/24 on 09/18/16  Time 4   Period Weeks   Status Partially Met     PT SHORT TERM GOAL #4   Title Pt will negotiate 16 stairs with one rail and alternating sequence Mod I without catching/dragging L foot when ascending or descending   Baseline met 09/18/16   Time 4   Period Weeks   Status Achieved           PT Long Term Goals - 08/21/16 2049      PT LONG TERM GOAL #1   Title (TARGET DATE FOR ALL LTG 10/20/2016)  Pt will demonstrate independence with balance/strengthening HEP   Time 8   Period Weeks   Status New     PT LONG TERM GOAL #2   Title Pt will improve gait velocity to >3.65f/sec without AD   Time 8   Period Weeks   Status New     PT LONG TERM GOAL #3   Title Pt will decrease falls risk during gait by improving DGI score to >22/24 without AD   Time 8   Period Weeks   Status New     PT LONG TERM GOAL #4   Title Pt will ambulate >1,000 outside over uneven grassy surfaces while hitting whiffle golf ball x 10 times without LOB   Time 8   Period Weeks   Status New     PT LONG TERM GOAL #5   Title Pt will report recovery from CVA at >85% on Stroke Impace Scale   Baseline 72%   Time 8   Period Weeks   Status New               Plan - 09/18/16 1103    Clinical Impression Statement Skilled session focused on addressing STGs.  Pt has met 3/4 STGs, partially meeting goal for DGI demonstrating score of 18/24 (one short of goal status). Pt making good progress towards LTGs, however still limited by vestibular deficits.  Cues and demo for gaze stabilization exercises.    Rehab Potential Good   Clinical Impairments Affecting Rehab Potential Extensive medical history: RA with chronic prednisone use, GI bleed with partial  colectomy, prostate CA, bilateral TKA, Spondylolisthesis at L4-L5, HTN, R MCA CVA, Hiatal hernia, recent lumbar decompression for stenosis   PT Frequency 2x / week   PT Treatment/Interventions ADLs/Self Care Home Management;DME Instruction;Gait training;Stair training;Functional mobility training;Therapeutic activities;Therapeutic exercise;Balance training;Neuromuscular re-education;Patient/family education;Orthotic Fit/Training;Taping   PT Next Visit Plan review gaze adaptation; add corner balance; keep on high level balance especially on compliant surfaces, control with weight shifting, LLE NMR, and pt to bring a few golf clubs to practice swinging in the grass.     PT Home Exercise Plan Incorporate walking outside, balance with golfing activities on grass   Consulted and Agree with Plan of Care Patient      Patient will benefit from skilled therapeutic intervention in order to improve the following deficits and impairments:  Abnormal gait, Decreased balance, Difficulty walking, Decreased strength, Other (comment)  Visit Diagnosis: Hemiplegia and hemiparesis following cerebral infarction affecting left non-dominant side (HCC)  Muscle weakness (generalized)  Other abnormalities of gait and mobility  Unsteadiness on feet     Problem List Patient Active Problem List   Diagnosis Date Noted  . Diabetic neuropathy associated with type 2 diabetes mellitus ([place] 08/29/2016  . Embolic stroke of right basal ganglia ([place] 08/16/2016  . 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 Gait disturbance, post-stroke 08/13/2016  . Left middle cerebral artery stroke ([place] 08/11/2016  .  Acute ischemic stroke ([place]) 08/10/2016  . Hypertensive urgency 08/10/2016  . Right middle cerebral artery stroke ([place]) 08/10/2016  . Acute left-sided weakness   . Left-sided weakness   . History of lower GI bleeding   . Hiatal hernia   . 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 Pandiverticulosis of colon with  recurrent hemorrhage s/p total abdominal colectomy 06/10/13 02/04/2013  . Acute post-hemorrhagic anemia 02/03/2013  . Rheumatoid arthritis ([place]) 04/14/2011  . Long-term current use of steroids 04/14/2011  . Iron deficiency anemia 04/14/2011    [place]on Sprang, PT, MPT [facility] [address] [place], [place], [address] Phone: [phone]   Fax:  [phone] 09/18/16, 11:06 AM  Name: [name] MRN: [id number] Date of Birth: [birth year]

## 2016-09-21 ENCOUNTER — Ambulatory Visit: Payer: PPO | Admitting: Physical Therapy

## 2016-09-26 ENCOUNTER — Ambulatory Visit: Payer: PPO | Admitting: Physical Therapy

## 2016-09-26 ENCOUNTER — Encounter: Payer: Self-pay | Admitting: Physical Therapy

## 2016-09-26 DIAGNOSIS — R2689 Other abnormalities of gait and mobility: Secondary | ICD-10-CM

## 2016-09-26 DIAGNOSIS — M6281 Muscle weakness (generalized): Secondary | ICD-10-CM

## 2016-09-26 DIAGNOSIS — I69354 Hemiplegia and hemiparesis following cerebral infarction affecting left non-dominant side: Secondary | ICD-10-CM | POA: Diagnosis not present

## 2016-09-26 DIAGNOSIS — R2681 Unsteadiness on feet: Secondary | ICD-10-CM

## 2016-09-27 ENCOUNTER — Encounter: Payer: Self-pay | Admitting: Diagnostic Neuroimaging

## 2016-09-27 ENCOUNTER — Ambulatory Visit (INDEPENDENT_AMBULATORY_CARE_PROVIDER_SITE_OTHER): Payer: PPO | Admitting: Diagnostic Neuroimaging

## 2016-09-27 VITALS — BP 162/84 | HR 63 | Wt 180.8 lb

## 2016-09-27 DIAGNOSIS — I639 Cerebral infarction, unspecified: Secondary | ICD-10-CM | POA: Diagnosis not present

## 2016-09-27 DIAGNOSIS — M48062 Spinal stenosis, lumbar region with neurogenic claudication: Secondary | ICD-10-CM

## 2016-09-27 DIAGNOSIS — G609 Hereditary and idiopathic neuropathy, unspecified: Secondary | ICD-10-CM

## 2016-09-27 DIAGNOSIS — I480 Paroxysmal atrial fibrillation: Secondary | ICD-10-CM | POA: Diagnosis not present

## 2016-09-27 NOTE — Progress Notes (Signed)
[facility]  PATIENT: [name] DOB: 1940/11/13  REFERRING CLINICIAN: H Stoneking HISTORY FROM: patient REASON FOR VISIT: follow up    HISTORICAL  CHIEF COMPLAINT:  Chief Complaint  Patient presents with  . stroke    rm 7, "taking therapy, doing very well"  . Follow-up    hospital FU    HISTORY OF PRESENT ILLNESS:   UPDATE 09/27/16: Since last visit, went to hospital in April 2018 for left sided weakness and falling down, dx'd with bilateral strokes (right caudate PLIC, left parietal, right temporal-occipital) likely due to paroxysmal atrial fibrillation. Now on eliquis anticoagulation. Since then doing well. Attending PT and OT. Left side weakness is improved.   UPDATE 06/28/16: Since last visit, doing better. Had back surgery in Jan 2018, and low back and feet are improved. Some numbness continues. Overall doing well.  PRIOR HPI (03/24/17): 76 year old right-handed male here for valuation of neuropathy. Patient has history of rheumatoid arthritis, osteoarthritis, lumbar spinal stenosis, bilateral knee replacements. Past 8-10 years patient has had onset of numbness, tingling, burning pain in his calves and ankles and feet. Over the past 5 years he has had low back pain radiating into his legs. Both of these symptoms have worsened in the last 2-3 years. 2014 patient had EMG nerve conduction study demonstrating severe peripheral neuropathy as well as lumbar radiculopathy. Patient has MRI of the lumbar spine showing severe spinal stenosis at L4-5. Patient has been treating this conservatively with epidural steroid injections and physical therapy. Patient has 20-30 years of rheumatoid arthritis on multiple disease modifying therapies. Currently on arava and prednisone.    REVIEW OF SYSTEMS: Full 14 system review of systems performed and negative with exception of: dizziness agitation blurred vision back pain itching leg swelling chills fatigue.     ALLERGIES: Allergies  Allergen Reactions  . Clindamycin/Lincomycin Hives, Shortness Of Breath and Swelling  . Enbrel [Etanercept] Swelling    SEVERE LEG SWELLING   . Penicillins Hives and Shortness Of Breath    Has patient had a PCN reaction causing immediate rash, facial/tongue/throat swelling, SOB or lightheadedness with hypotension: Yes Has patient had a PCN reaction causing severe rash involving mucus membranes or skin necrosis: Yes Has patient had a PCN reaction that required hospitalization No Has patient had a PCN reaction occurring within the last 10 years: No If all of the above answers are "NO", then may proceed with Cephalosporin use.   . Codeine Hives and Swelling    HOME MEDICATIONS: Outpatient Medications Prior to Visit  Medication Sig Dispense Refill  . amLODipine (NORVASC) 5 MG tablet Take 5 mg by mouth daily.    Marland Kitchen apixaban (ELIQUIS STARTER PACK) 5 MG TABS tablet Take 1 tablet (5 mg total) by mouth 2 (two) times daily. 60 tablet 2  . diphenhydramine-acetaminophen (TYLENOL PM) 25-500 MG TABS tablet Take 1 tablet by mouth at bedtime.    . ferrous sulfate 325 (65 FE) MG tablet Take 1 tablet (325 mg total) by mouth 2 (two) times daily with a meal. 60 tablet 3  . leflunomide (ARAVA) 20 MG tablet Take 20 mg by mouth daily.     Marland Kitchen omeprazole (PRILOSEC) 20 MG capsule Take 1 capsule (20 mg total) by mouth daily. 30 capsule 1  . predniSONE (DELTASONE) 5 MG tablet Take 1 tablet (5 mg total) by mouth daily. 30 tablet 1  . rosuvastatin (CRESTOR) 5 MG tablet Take 20 mg by mouth daily.    Marland Kitchen tetrahydrozoline-zinc (VISINE-AC) 0.05-0.25 % ophthalmic  solution Place 2 drops into both eyes 3 (three) times daily as needed (for allergies).    . traMADol (ULTRAM) 50 MG tablet Take 1 tablet (50 mg total) by mouth every 6 (six) hours as needed for moderate pain or severe pain. 20 tablet 0  . atorvastatin (LIPITOR) 80 MG tablet Take 0.5 tablets (40 mg total) by mouth daily at 6 PM. (Patient  not taking: Reported on 08/29/2016) 30 tablet 12   No facility-administered medications prior to visit.     PAST MEDICAL HISTORY: Past Medical History:  Diagnosis Date  . Blood transfusion    " no reaction from transfusion"  . BPH (benign prostatic hyperplasia)   . Bruises easily   . Bursitis    chronic hip pain  . Cancer [facility])    prostate cancer cells  . Disc disease, degenerative, lumbar or lumbosacral    Chronic back pain now  . Diverticulitis   . Esophageal ring    requiring dilations   . GERD (gastroesophageal reflux disease)   . History of GI bleed    "vessels burst in colon" x2  . History of kidney stones    chronic  . History of vertigo   . Osteoarthritis   . Osteoporosis   . Pneumonia    yrs ago  . Rash    GROIN - ITCHING PAST 3 MONTHS - it is gone now 05/02/16  . Rheumatoid arthritis(714.0)    27 years  . Spondylisthesis   . Stroke ([place]) 07/2016  . Ureteral stone 11/2012    PAST SURGICAL HISTORY: Past Surgical History:  Procedure Laterality Date  . BALLOON DILATION N/A 10/01/2012   Procedure: BALLOON DILATION;  Surgeon: Garlan Fair, MD;  Location: [facility];  Service: Endoscopy;  Laterality: N/A;  . COLECTOMY N/A 06/10/2013   Procedure: TOTAL abdominal COLECTOMY;  Surgeon: Odis Hollingshead, MD;  Location: [facility];  Service: General;  Laterality: N/A;  . COLONOSCOPY    . CYSTOSCOPY WITH URETEROSCOPY Right 12/05/2012   Procedure: CYSTOSCOPY WITH RIGHT URETEROSCOPY AND STONE EXTRACTION;  Surgeon: Malka So, MD;  Location: [facility];  Service: Urology;  Laterality: Right;  . ESOPHAGOGASTRODUODENOSCOPY N/A 10/01/2012   Procedure: ESOPHAGOGASTRODUODENOSCOPY (EGD);  Surgeon: Garlan Fair, MD;  Location: [facility];  Service: Endoscopy;  Laterality: N/A;  . HERNIA REPAIR    . INGUINAL HERNIA REPAIR  2007   Bilateral   . Lithotripsy for nephrolithiasis     Pt and wife not sure when but here in [place]  . Lysis of Adhesion, small bowel resection   2009  . MAXIMUM ACCESS (MAS)POSTERIOR LUMBAR INTERBODY FUSION (PLIF) 1 LEVEL N/A 05/08/2016   Procedure: LUMBAR FOUR-FIVE FOR MAXIMUM ACCESS (MAS) POSTERIOR LUMBAR INTERBODY FUSION;  Surgeon: Kary Kos, MD;  Location: [facility];  Service: Neurosurgery;  Laterality: N/A;  . NISSEN FUNDOPLICATION     about 1062, pt not sure records not currently available  . TOTAL KNEE ARTHROPLASTY  2003   Bilateral  . TRANSURETHRAL PROSTATECTOMY WITH GYRUS INSTRUMENTS N/A 12/05/2012   Procedure: TRANSURETHRAL PROSTATECTOMY WITH GYRUS INSTRUMENTS;  Surgeon: Malka So, MD;  Location: [facility];  Service: Urology;  Laterality: N/A;  . Ventral Hernia Repair with Mesh      FAMILY HISTORY: Family History  Problem Relation Age of Onset  . Adopted: Yes  . Heart failure Mother   . Heart failure Father     SOCIAL HISTORY:  Social History   Social History  . Marital status: Married  Spouse name: [name]  . Number of children: 2  . Years of education: 12   Occupational History  .      Chief Financial Officer, retired   Social History Main Topics  . Smoking status: Never Smoker  . Smokeless tobacco: Never Used  . Alcohol use No  . Drug use: No  . Sexual activity: Not Currently   Other Topics Concern  . Not on file   Social History Narrative   Lives at home with wife   Caffeine - Cokes, 1-2 weekly; coffee, 2-3 cups daily in winter     PHYSICAL EXAM  GENERAL EXAM/CONSTITUTIONAL: Vitals:  Vitals:   09/27/16 1020  BP: (!) 162/84  Pulse: 63  Weight: 180 lb 12.8 oz (82 kg)   Body mass index is 28.32 kg/m. No exam data present  Patient is in no distress; well developed, nourished and groomed; neck is supple  CARDIOVASCULAR:  Examination of carotid arteries is normal; no carotid bruits  Regular rate and rhythm, no murmurs  Examination of peripheral vascular system by observation and palpation is normal  EYES:  Ophthalmoscopic exam of optic discs and posterior segments is normal; no  papilledema or hemorrhages  MUSCULOSKELETAL:  Gait, strength, tone, movements noted in Neurologic exam below  NEUROLOGIC: MENTAL STATUS:  No flowsheet data found.  awake, alert, oriented to person, place and time  recent and remote memory intact  normal attention and concentration  language fluent, comprehension intact, naming intact,   fund of knowledge appropriate  CRANIAL NERVE:   2nd - no papilledema on fundoscopic exam  2nd, 3rd, 4th, 6th - pupils equal and reactive to light, visual fields full to confrontation, extraocular muscles intact, no nystagmus  5th - facial sensation symmetric  7th - facial strength symmetric  8th - hearing intact  9th - palate elevates symmetrically, uvula midline  11th - shoulder shrug symmetric  12th - tongue protrusion midline  MOTOR:   normal bulk and tone, full strength in the BUE, BLE  SENSORY:   normal and symmetric to light touch  DECR VIB AT TOES (6-8 SECONDS)  COORDINATION:   finger-nose-finger, fine finger movements normal  RIGHT ARM ORBITS AROUND LEFT  REFLEXES:   deep tendon reflexes --> ABSENT THROUGHOUT  GAIT/STATION:   narrow based gait; SMOOTH STRIDE AND TURNING; SLIGHTLY CAUTIOUS    DIAGNOSTIC DATA (LABS, IMAGING, TESTING) - I reviewed patient records, labs, notes, testing and imaging myself where available.  Lab Results  Component Value Date   WBC 7.0 08/14/2016   HGB 9.4 (L) 08/14/2016   HCT 32.2 (L) 08/14/2016   MCV 75.6 (L) 08/14/2016   PLT 245 08/14/2016      Component Value Date/Time   NA 140 08/14/2016 0447   K 3.9 08/14/2016 0447   CL 107 08/14/2016 0447   CO2 27 08/14/2016 0447   GLUCOSE 94 08/14/2016 0447   BUN 13 08/14/2016 0447   CREATININE 1.15 08/14/2016 0447   CALCIUM 8.5 (L) 08/14/2016 0447   PROT 5.9 (L) 08/14/2016 0447   PROT 7.4 03/24/2016 1226   ALBUMIN 3.1 (L) 08/14/2016 0447   AST 16 08/14/2016 0447   ALT 9 (L) 08/14/2016 0447   ALKPHOS 72 08/14/2016 0447    BILITOT 0.6 08/14/2016 0447   GFRNONAA >60 08/14/2016 0447   GFRAA >60 08/14/2016 0447   Lab Results  Component Value Date   CHOL 140 08/10/2016   HDL 48 08/10/2016   LDLCALC 70 08/10/2016   TRIG 109 08/10/2016   CHOLHDL  2.9 08/10/2016   Lab Results  Component Value Date   HGBA1C 5.8 (H) 08/10/2016   Lab Results  Component Value Date   PJKDTOIZ12 458 03/24/2016   Lab Results  Component Value Date   TSH 0.764 03/24/2016    08/16/12 EMG/NCS (Dr. Brien Few) - Significant peripheral sensorimotor polyneuropathy - Mild lumbar radiculitis  08/26/15 MRI LUMBAR [I reviewed images myself and agree with interpretation. -VRP]  1. Chronic grade 1 L4-L5 spondylolisthesis with progression since 2012. Progressed and severe chronic disc degeneration. Acute and chronic endplate degeneration. Moderate to severe facet and posterior element degeneration. Subsequent L4-L5 multifactorial severe spinal, lateral recess, and right foraminal stenosis. 2. Chronic but less pronounced L5-S1 disc, endplate, and facet degeneration resulting in mild lateral recess and left foraminal stenosis. 3. Minimal to mild lumbar spine degeneration elsewhere.  08/09/16 CT head  1. 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 2. ASPECTS is 10.  08/10/16 CTA head / neck 1. No emergent intracranial large vessel occlusion. 2. 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 3. Otherwise, no hemodynamically significant stenosis of the major cervical arteries.  08/10/16 MRI / MRA brain [I reviewed images myself and agree with interpretation. -VRP]  1. Acute ischemia of the right caudate tail extending to the posterior limb of the right internal capsule, in keeping with acute left-sided weakness. 2. Punctate foci of acute ischemia within the left parietal lobe and at the right temporal occipital junction. The distribution of lesions suggests a central  cardioembolic process. 3. No hemorrhage or mass effect. 4. Chronic microvascular ischemia. 5. Normal intracranial MRA.  08/11/16 TTE - Left ventricle: The cavity size was normal. Wall thickness was   normal. Systolic function was normal. The estimated ejection   fraction was in the range of 60% to 65%. Wall motion was normal;   there were no regional wall motion abnormalities. Doppler   parameters are consistent with abnormal left ventricular   relaxation (grade 1 diastolic dysfunction). - Aortic valve: There was mild regurgitation. - Mitral valve: There was mild regurgitation. - Left atrium: The atrium was mildly to moderately dilated. - Right atrium: The atrium was mildly dilated.  08/10/16 BLE u/s - No evidence of deep vein or superficial thrombosis involving the right lower extremity and left lower extremity.      ASSESSMENT AND PLAN  76 y.o. year old male here with bilateral lower extremity numbness, pain, sensitivity, with low back pain radiating to the bilateral lower extremities. Patient has signs and symptoms of lumbar spinal stenosis with neurogenic claudication which has improved with decompression surgery on 05/08/16. He also may have superimposed polyneuropathy, which may be due to underlying rheumatoid arthritis, immunomodulatory medication and less likely MGUS.   Now with cardioembolic strokes due to pAfib in April 2018, now on eliquis.    Ddx: lumbar spinal stenosis + peripheral neuropathy (hereditary vs acquired)  1. Cardioembolic stroke (HCC)   2. Paroxysmal atrial fibrillation ([place])   3. Spinal stenosis, lumbar region, with neurogenic claudication   4. Hereditary and idiopathic peripheral neuropathy      PLAN:  CARDIOEMBOLIC STROKES / ATRIAL FIBRILLATION (new problem, no additional workup) - continue eliquis - continue BP control - continue crestor - consider PREMIER research study (dental health; stroke)  HYPERTENSION (established problem,  worsening) - follow up BP (162/84) with PCP  LUMBAR SPINAL STENOSIS (established problem, stable) - continue conservative treatment of lumbar spine disease (Dr. Brien Few, Dr. Saintclair Halsted, PT)  NEUROPATHY/MGUS (established problem, stable) - follow  up MGUS with Dr. Alen Blew  Return if symptoms worsen or fail to improve, for return to PCP.    Penni Bombard, MD 10/19/8887, 16:94 AM Certified in Neurology, Neurophysiology and Neuroimaging  [facility] [address] [place], [place] [address] [phone]

## 2016-09-27 NOTE — Therapy (Signed)
[facility] [address] [place], [place], [address] Phone: [phone]   Fax:  [phone]  Physical Therapy Treatment  Patient Details  Name: [name] MRN: [id number] Date of Birth: 1940-08-06 Referring Provider: Dr. Alysia Penna  Encounter Date: 09/26/2016      PT End of Session - 09/26/16 0807    Visit Number 8   Number of Visits 17   Date for PT Re-Evaluation 10/20/16   Authorization Type [employer]-G code and 10th visit progress note   PT Start Time 0804   PT Stop Time 0845   PT Time Calculation (min) 41 min   Activity Tolerance Patient limited by fatigue;Patient tolerated treatment well  neuropathic pain   Behavior During Therapy [facility] for tasks assessed/performed      Past Medical History:  Diagnosis Date  . Blood transfusion    " no reaction from transfusion"  . BPH (benign prostatic hyperplasia)   . Bruises easily   . Bursitis    chronic hip pain  . Cancer [facility])    prostate cancer cells  . Disc disease, degenerative, lumbar or lumbosacral    Chronic back pain now  . Diverticulitis   . Esophageal ring    requiring dilations   . GERD (gastroesophageal reflux disease)   . History of GI bleed    "vessels burst in colon" x2  . History of kidney stones    chronic  . History of vertigo   . Osteoarthritis   . Osteoporosis   . Pneumonia    yrs ago  . Rash    GROIN - ITCHING PAST 3 MONTHS - it is gone now 05/02/16  . Rheumatoid arthritis(714.0)    27 years  . Spondylisthesis   . Stroke ([place]) 07/2016  . Ureteral stone 11/2012    Past Surgical History:  Procedure Laterality Date  . BALLOON DILATION N/A 10/01/2012   Procedure: BALLOON DILATION;  Surgeon: Garlan Fair, MD;  [facility];  Service: Endoscopy;  Laterality: N/A;  . COLECTOMY N/A 06/10/2013   Procedure: TOTAL abdominal COLECTOMY;  Surgeon: Odis Hollingshead, MD;  Location: [facility];  Service:  General;  Laterality: N/A;  . COLONOSCOPY    . CYSTOSCOPY WITH URETEROSCOPY Right 12/05/2012   Procedure: CYSTOSCOPY WITH RIGHT URETEROSCOPY AND STONE EXTRACTION;  Surgeon: Malka So, MD;  [facility];  Service: Urology;  Laterality: Right;  . ESOPHAGOGASTRODUODENOSCOPY N/A 10/01/2012   Procedure: ESOPHAGOGASTRODUODENOSCOPY (EGD);  Surgeon: Garlan Fair, MD;  Location[facility];  Service: Endoscopy;  Laterality: N/A;  . HERNIA REPAIR    . INGUINAL HERNIA REPAIR  2007   Bilateral   . Lithotripsy for nephrolithiasis     Pt and wife not sure when but here in [place]  . Lysis of Adhesion, small bowel resection  2009  . MAXIMUM ACCESS (MAS)POSTERIOR LUMBAR INTERBODY FUSION (PLIF) 1 LEVEL N/A 05/08/2016   Procedure: LUMBAR FOUR-FIVE FOR MAXIMUM ACCESS (MAS) POSTERIOR LUMBAR INTERBODY FUSION;  Surgeon: Kary Kos, MD;  Location: [facility];  Service: Neurosurgery;  Laterality: N/A;  . NISSEN FUNDOPLICATION     about 8032, pt not sure records not currently available  . TOTAL KNEE ARTHROPLASTY  2003   Bilateral  . TRANSURETHRAL PROSTATECTOMY WITH GYRUS INSTRUMENTS N/A 12/05/2012   Procedure: TRANSURETHRAL PROSTATECTOMY WITH GYRUS INSTRUMENTS;  Surgeon: Malka So, MD;  [facility];  Service: Urology;  Laterality: N/A;  . Ventral Hernia Repair with Mesh  There were no vitals filed for this visit.      Subjective Assessment - 09/26/16 0806    Subjective Overdid on Saturday by pressure washing house for 4 hours. Was sore the next 2 days: could not lift right arm and dragging left leg. Better today. Has learned to spread the work out so not to overdo it since then. No falls or pain to report.l   Patient is accompained by: Family member   Pertinent History RA, GI bleed with colectomy, prostate CA, bilat TKA, Spondylolisthesis at L4-L5, HTN, hiatal hernia, lumbar decompression for stenosis   Limitations Walking   Patient Stated Goals Make the LLE stronger   Currently in Pain?  No/denies   Pain Score 0-No pain             Vestibular Treatment/Exercise - 09/26/16 0810      Vestibular Treatment/Exercise   Vestibular Treatment Provided Gaze     X1 Viewing Horizontal   Foot Position seated   Reps 2   Comments 30 seconds     X1 Viewing Vertical   Foot Position seated   Reps 2   Comments 30 seconds            Balance Exercises - 09/26/16 0816      Balance Exercises: Standing   Standing Eyes Closed Narrow base of support (BOS);Wide (BOA);Head turns;Foam/compliant surface;Other reps (comment);30 secs;Limitations   Rockerboard Anterior/posterior;Lateral;Head turns;EO;EC;30 seconds;10 reps   Balance Beam standing with feet across blue foam beam with no UE support on bars: alternating fwd stepping to floor and back onto foam x 10 reps each side, alternating bwd stepping to floor and back onto foam x 10 reps, no UE support with min assist for balance. cues on posture, step height and weight shifting with activity                      Balance Exercises: Standing   Standing Eyes Closed Limitations on 2 pillows in corner with chair in front for safety: narrow base of support EC no head movements, followed by wide base of support with EC head movements left<>right, up<>down and diagonals both ways x 10 reps each. no UE support with min assist for balance recovery.                                     Rebounder Limitations performed both ways on balance board without UE support: rocking board with EO with emphasis on tall posture; Holding board steady with EC no head movements, progressing to EC head movements left<>right and up<>down. min to mod assist for balance with cues on posture and weight shifting                                        PT Short Term Goals - 09/18/16 0858      PT SHORT TERM GOAL #1   Title (TARGET DATE FOR ALL STG 09/20/16)  Pt will improve LE functional strength as indicated by 5 times sit to stand =/< 12 seconds   Baseline 11.03  secs without UE support from mat 09/18/16   Time 4   Period Weeks   Status Achieved     PT SHORT TERM GOAL #2   Title Pt will improve safety with community gait as indicated by increase in  gait velocity to = / > 3.28 ft/sec   Baseline 3.56 ft/sec on 09/18/16   Time 4   Period Weeks   Status Achieved     PT SHORT TERM GOAL #3   Title Pt will decrease falls risk during gait as indicated by improvement in DGI to = / > 19/24   Baseline 18/24 on 09/18/16   Time 4   Period Weeks   Status Partially Met     PT SHORT TERM GOAL #4   Title Pt will negotiate 16 stairs with one rail and alternating sequence Mod I without catching/dragging L foot when ascending or descending   Baseline met 09/18/16   Time 4   Period Weeks   Status Achieved           PT Long Term Goals - 08/21/16 2049      PT LONG TERM GOAL #1   Title (TARGET DATE FOR ALL LTG 10/20/2016)  Pt will demonstrate independence with balance/strengthening HEP   Time 8   Period Weeks   Status New     PT LONG TERM GOAL #2   Title Pt will improve gait velocity to >3.58f/sec without AD   Time 8   Period Weeks   Status New     PT LONG TERM GOAL #3   Title Pt will decrease falls risk during gait by improving DGI score to >22/24 without AD   Time 8   Period Weeks   Status New     PT LONG TERM GOAL #4   Title Pt will ambulate >1,000 outside over uneven grassy surfaces while hitting whiffle golf ball x 10 times without LOB   Time 8   Period Weeks   Status New     PT LONG TERM GOAL #5   Title Pt will report recovery from CVA at >85% on Stroke Impace Scale   Baseline 72%   Time 8   Period Weeks   Status New            Plan - 09/26/16 0807    Clinical Impression Statement Today's skilled session continued to address high level balance activities with emphasis on increased vestibular system imput. No issues reported in session. Pt is progressing toward goals and should benefit from continued PT to progress toward unmet  goals.   Rehab Potential Good   Clinical Impairments Affecting Rehab Potential Extensive medical history: RA with chronic prednisone use, GI bleed with partial colectomy, prostate CA, bilateral TKA, Spondylolisthesis at L4-L5, HTN, R MCA CVA, Hiatal hernia, recent lumbar decompression for stenosis   PT Frequency 2x / week   PT Treatment/Interventions ADLs/Self Care Home Management;DME Instruction;Gait training;Stair training;Functional mobility training;Therapeutic activities;Therapeutic exercise;Balance training;Neuromuscular re-education;Patient/family education;Orthotic Fit/Training;Taping   PT Next Visit Plan keep on high level balance especially on compliant surfaces, control with weight shifting, LLE NMR, and pt to bring a few golf clubs to practice swinging in the grass.     PT Home Exercise Plan Incorporate walking outside, balance with golfing activities on grass   Consulted and Agree with Plan of Care Patient      Patient will benefit from skilled therapeutic intervention in order to improve the following deficits and impairments:  Abnormal gait, Decreased balance, Difficulty walking, Decreased strength, Other (comment)  Visit Diagnosis: Hemiplegia and hemiparesis following cerebral infarction affecting left non-dominant side (HCC)  Muscle weakness (generalized)  Other abnormalities of gait and mobility  Unsteadiness on feet     Problem List Patient Active Problem List  Diagnosis Date Noted  . Paroxysmal atrial fibrillation ([place]) 09/27/2016  . Diabetic neuropathy associated with type 2 diabetes mellitus ([place]) 08/29/2016  . Cardioembolic stroke ([place]) 32/67/1245  . 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 Gait disturbance, post-stroke 08/13/2016  . 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 Acute ischemic stroke ([place]) 08/10/2016  . Hypertensive urgency 08/10/2016  . Right middle cerebral artery stroke ([place]) 08/10/2016  . Acute left-sided weakness   . Left-sided  weakness   . History of lower GI bleeding   . Hiatal hernia   . 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 Acute post-hemorrhagic anemia 02/03/2013  . Rheumatoid arthritis ([place]) 04/14/2011  . Long-term current use of steroids 04/14/2011  . Iron deficiency anemia 04/14/2011    Willow Ora, PTA, [facility] [address] [place], [place] [address] [phone] 09/27/16, 3:10 PM   Name: [name] MRN: [id number] Date of Birth: Sep 21, 1940

## 2016-09-28 ENCOUNTER — Encounter: Payer: Self-pay | Admitting: Physical Therapy

## 2016-09-28 ENCOUNTER — Ambulatory Visit: Payer: PPO | Admitting: Physical Therapy

## 2016-09-28 VITALS — BP 166/94 | HR 70

## 2016-09-28 DIAGNOSIS — I69354 Hemiplegia and hemiparesis following cerebral infarction affecting left non-dominant side: Secondary | ICD-10-CM

## 2016-09-28 DIAGNOSIS — M6281 Muscle weakness (generalized): Secondary | ICD-10-CM

## 2016-09-28 DIAGNOSIS — R2689 Other abnormalities of gait and mobility: Secondary | ICD-10-CM

## 2016-09-28 DIAGNOSIS — R2681 Unsteadiness on feet: Secondary | ICD-10-CM

## 2016-09-28 NOTE — Therapy (Signed)
[facility] [address] [place], [place], [address] Phone: [phone]   Fax:  [phone]  Physical Therapy Treatment  Patient Details  Name: [name] MRN: [id number] Date of Birth: [birth year] Referring Provider: Dr. Alysia Penna  Encounter Date: 09/28/2016      PT End of Session - 09/28/16 0858    Visit Number 9   Number of Visits 17   Date for PT Re-Evaluation 10/20/16   Authorization Type [employer]-G code and 10th visit progress note   PT Start Time 0800   PT Stop Time 0849   PT Time Calculation (min) 49 min   Activity Tolerance Patient tolerated treatment well   Behavior During Therapy [facility] for tasks assessed/performed      Past Medical History:  Diagnosis Date  . Blood transfusion    " no reaction from transfusion"  . BPH (benign prostatic hyperplasia)   . Bruises easily   . Bursitis    chronic hip pain  . Cancer [facility])    prostate cancer cells  . Disc disease, degenerative, lumbar or lumbosacral    Chronic back pain now  . Diverticulitis   . Esophageal ring    requiring dilations   . GERD (gastroesophageal reflux disease)   . History of GI bleed    "vessels burst in colon" x2  . History of kidney stones    chronic  . History of vertigo   . Osteoarthritis   . Osteoporosis   . Pneumonia    yrs ago  . Rash    GROIN - ITCHING PAST 3 MONTHS - it is gone now 05/02/16  . Rheumatoid arthritis(714.0)    27 years  . Spondylisthesis   . Stroke ([place]) 07/2016  . Ureteral stone 11/2012    Past Surgical History:  Procedure Laterality Date  . BALLOON DILATION N/A 10/01/2012   Procedure: BALLOON DILATION;  Surgeon: Garlan Fair, MD;  [facility];  Service: Endoscopy;  Laterality: N/A;  . COLECTOMY N/A 06/10/2013   Procedure: TOTAL abdominal COLECTOMY;  Surgeon: Odis Hollingshead, MD;  [facility];  Service: General;  Laterality: N/A;  . COLONOSCOPY     . CYSTOSCOPY WITH URETEROSCOPY Right 12/05/2012   Procedure: CYSTOSCOPY WITH RIGHT URETEROSCOPY AND STONE EXTRACTION;  Surgeon: Malka So, MD;  [facility];  Service: Urology;  Laterality: Right;  . ESOPHAGOGASTRODUODENOSCOPY N/A 10/01/2012   Procedure: ESOPHAGOGASTRODUODENOSCOPY (EGD);  Surgeon: Garlan Fair, MD;  [facility];  Service: Endoscopy;  Laterality: N/A;  . HERNIA REPAIR    . INGUINAL HERNIA REPAIR  2007   Bilateral   . Lithotripsy for nephrolithiasis     Pt and wife not sure when but here in [place]  . Lysis of Adhesion, small bowel resection  2009  . MAXIMUM ACCESS (MAS)POSTERIOR LUMBAR INTERBODY FUSION (PLIF) 1 LEVEL N/A 05/08/2016   Procedure: LUMBAR FOUR-FIVE FOR MAXIMUM ACCESS (MAS) POSTERIOR LUMBAR INTERBODY FUSION;  Surgeon: Kary Kos, MD;  Location: [facility];  Service: Neurosurgery;  Laterality: N/A;  . NISSEN FUNDOPLICATION     about 8916, pt not sure records not currently available  . TOTAL KNEE ARTHROPLASTY  2003   Bilateral  . TRANSURETHRAL PROSTATECTOMY WITH GYRUS INSTRUMENTS N/A 12/05/2012   Procedure: TRANSURETHRAL PROSTATECTOMY WITH GYRUS INSTRUMENTS;  Surgeon: Malka So, MD;  [facility];  Service: Urology;  Laterality: N/A;  . Ventral Hernia Repair with Mesh      Vitals:   09/28/16 0830  BP: (!) 166/94  Pulse: 70        Subjective Assessment - 09/28/16 0816    Subjective Doing well today; had appt with neurologist who explained to him where his strokes were.  Did x 1 viewing last night and woke up feeling dizzy this morning (swimmy headed).   Patient is accompained by: Family member   Pertinent History RA, GI bleed with colectomy, prostate CA, bilat TKA, Spondylolisthesis at L4-L5, HTN, hiatal hernia, lumbar decompression for stenosis   Limitations Walking   Patient Stated Goals Make the LLE stronger   Currently in Pain? No/denies                Vestibular Assessment - 09/28/16 0831      Vestibular Assessment    General Observation Per MD note bilat CVA in caudate, parietal lobe and temporal-occipital.  Pt does report blurring of vision in L eye but may be cataracts.       Equities trader Comment Composite score of 65; dizziness rating remained the same before and after SOT testing      Conditions: 1: 3 trials WFL 2: 3 trials WFL 3: Trial 1 below average, trials 2/3 [facility] 4: 3 trials WFL 5: Fall trial 1, trials 2/3 [facility] 6: Fall trial 1, trials 2/3 WF Composite score: 65 Sensory Analysis Som: WNL Vis: WNL Vest: Below average: 40% Pref: WNL Strategy analysis: Moderate use of hip/ankle strategy; 2 falls COG alignment: Pt with COG maintained in midline, to L slightly        Vestibular Treatment/Exercise - 09/28/16 0848      Vestibular Treatment/Exercise   Vestibular Treatment Provided Gaze   Gaze Exercises X1 Viewing Horizontal;X1 Viewing Vertical     X1 Viewing Horizontal   Foot Position seated without back support   Reps 1   Comments 30 seconds, mild increase in symptoms     X1 Viewing Vertical   Foot Position seated without back support   Reps 1   Comments 30 seconds; no increase in symptoms               PT Education - 09/28/16 0856    Education provided Yes   Education Details Review of SOT findings and review of x 1 viewing due to persistence of symptoms   Person(s) Educated Patient   Methods Explanation;Demonstration   Comprehension Verbalized understanding;Returned demonstration          PT Short Term Goals - 09/18/16 0858      PT SHORT TERM GOAL #1   Title (TARGET DATE FOR ALL STG 09/20/16)  Pt will improve LE functional strength as indicated by 5 times sit to stand =/< 12 seconds   Baseline 11.03 secs without UE support from mat 09/18/16   Time 4   Period Weeks   Status Achieved     PT SHORT TERM GOAL #2   Title Pt will improve safety with community gait as indicated by increase in gait velocity  to = / > 3.28 ft/sec   Baseline 3.56 ft/sec on 09/18/16   Time 4   Period Weeks   Status Achieved     PT SHORT TERM GOAL #3   Title Pt will decrease falls risk during gait as indicated by improvement in DGI to = / > 19/24   Baseline 18/24 on 09/18/16   Time 4   Period Weeks   Status Partially Met     PT SHORT TERM GOAL #  4   Title Pt will negotiate 16 stairs with one rail and alternating sequence Mod I without catching/dragging L foot when ascending or descending   Baseline met 09/18/16   Time 4   Period Weeks   Status Achieved           PT Long Term Goals - 08/21/16 2049      PT LONG TERM GOAL #1   Title (TARGET DATE FOR ALL LTG 10/20/2016)  Pt will demonstrate independence with balance/strengthening HEP   Time 8   Period Weeks   Status New     PT LONG TERM GOAL #2   Title Pt will improve gait velocity to >3.67f/sec without AD   Time 8   Period Weeks   Status New     PT LONG TERM GOAL #3   Title Pt will decrease falls risk during gait by improving DGI score to >22/24 without AD   Time 8   Period Weeks   Status New     PT LONG TERM GOAL #4   Title Pt will ambulate >1,000 outside over uneven grassy surfaces while hitting whiffle golf ball x 10 times without LOB   Time 8   Period Weeks   Status New     PT LONG TERM GOAL #5   Title Pt will report recovery from CVA at >85% on Stroke Impace Scale   Baseline 72%   Time 8   Period Weeks   Status New               Plan - 09/28/16 06644   Clinical Impression Statement Continued assessment of vestibular impairment with SOT assessment to determine pt's ability to use visual, somatosensory and vestibular systems for balance.  Please see results.  Due to persistence of symptoms after performing HEP reviewed gaze adaptation with pt with no significant adjustments needing to be made; recommended pt to sit in chair with back support to prevent shoulder rotation (moving head en bloc).  Will continue to address.   Rehab  Potential Good   Clinical Impairments Affecting Rehab Potential Extensive medical history: RA with chronic prednisone use, GI bleed with partial colectomy, prostate CA, bilateral TKA, Spondylolisthesis at L4-L5, HTN, R MCA CVA, Hiatal hernia, recent lumbar decompression for stenosis   PT Frequency 2x / week   PT Duration 8 weeks   PT Treatment/Interventions ADLs/Self Care Home Management;DME Instruction;Gait training;Stair training;Functional mobility training;Therapeutic activities;Therapeutic exercise;Balance training;Neuromuscular re-education;Patient/family education;Orthotic Fit/Training;Taping   PT Next Visit Plan 10th visit G code next visit!!!  focus on vestibular system-decreasing visual input, compliant surfaces-golf outside in grass   PT Home Exercise Plan Incorporate walking outside, balance with golfing activities on grass   Consulted and Agree with Plan of Care Patient      Patient will benefit from skilled therapeutic intervention in order to improve the following deficits and impairments:  Abnormal gait, Decreased balance, Difficulty walking, Decreased strength, Other (comment)  Visit Diagnosis: Hemiplegia and hemiparesis following cerebral infarction affecting left non-dominant side (HCC)  Muscle weakness (generalized)  Other abnormalities of gait and mobility  Unsteadiness on feet     Problem List Patient Active Problem List   Diagnosis Date Noted  . Paroxysmal atrial fibrillation ([place] 09/27/2016  . Diabetic neuropathy associated with type 2 diabetes mellitus ([place] 08/29/2016  . Cardioembolic stroke ([place] 003/47/4259 . 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 Gait disturbance, post-stroke 08/13/2016  . Left middle cerebral artery stroke ([place] 08/11/2016  . Acute ischemic stroke ([place] 08/10/2016  .  Hypertensive urgency 08/10/2016  . Right middle cerebral artery stroke ([place]) 08/10/2016  . Acute left-sided weakness   . Left-sided weakness   . History of lower GI  bleeding   . Hiatal hernia   . 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 Acute post-hemorrhagic anemia 02/03/2013  . Rheumatoid arthritis ([place]) 04/14/2011  . Long-term current use of steroids 04/14/2011  . Iron deficiency anemia 04/14/2011   Raylene Everts, PT, DPT 09/28/16    9:17 AM    [facility] [address] [place], [place], [address] Phone: [phone]   Fax:  [phone]  Name: Iliya F [name] MRN: [id number] Date of Birth: 1941-01-19

## 2016-10-03 ENCOUNTER — Encounter: Payer: Self-pay | Admitting: Physical Therapy

## 2016-10-03 ENCOUNTER — Ambulatory Visit: Payer: PPO | Admitting: Physical Therapy

## 2016-10-03 DIAGNOSIS — I69354 Hemiplegia and hemiparesis following cerebral infarction affecting left non-dominant side: Secondary | ICD-10-CM | POA: Diagnosis not present

## 2016-10-03 DIAGNOSIS — M6281 Muscle weakness (generalized): Secondary | ICD-10-CM

## 2016-10-03 DIAGNOSIS — R2689 Other abnormalities of gait and mobility: Secondary | ICD-10-CM

## 2016-10-03 DIAGNOSIS — R2681 Unsteadiness on feet: Secondary | ICD-10-CM

## 2016-10-04 NOTE — Therapy (Signed)
[facility] [address] [place], [place], [address] Phone: [phone]   Fax:  [phone]  Physical Therapy Treatment  Patient Details  Name: [name] Referring Provider: Dr. Alysia Penna  Encounter Date: 10/03/2016      PT End of Session - 10/03/16 0811    Visit Number 10   Number of Visits 17   Date for PT Re-Evaluation 10/20/16   Authorization Type [employer]-G code and 10th visit progress note   PT Start Time 0803   PT Stop Time 0845   PT Time Calculation (min) 42 min   Activity Tolerance Patient tolerated treatment well;Patient limited by fatigue   Behavior During Therapy [facility] for tasks assessed/performed      Past Medical History:  Diagnosis Date  . Blood transfusion    " no reaction from transfusion"  . BPH (benign prostatic hyperplasia)   . Bruises easily   . Bursitis    chronic hip pain  . Cancer [facility])    prostate cancer cells  . Disc disease, degenerative, lumbar or lumbosacral    Chronic back pain now  . Diverticulitis   . Esophageal ring    requiring dilations   . GERD (gastroesophageal reflux disease)   . History of GI bleed    "vessels burst in colon" x2  . History of kidney stones    chronic  . History of vertigo   . Osteoarthritis   . Osteoporosis   . Pneumonia    yrs ago  . Rash    GROIN - ITCHING PAST 3 MONTHS - it is gone now 05/02/16  . Rheumatoid arthritis(714.0)    27 years  . Spondylisthesis   . Stroke ([place]) 07/2016  . Ureteral stone 11/2012    Past Surgical History:  Procedure Laterality Date  . BALLOON DILATION N/A 10/01/2012   Procedure: BALLOON DILATION;  Surgeon: Garlan Fair, MD;  Location: [facility];  Service: Endoscopy;  Laterality: N/A;  . COLECTOMY N/A 06/10/2013   Procedure: TOTAL abdominal COLECTOMY;  Surgeon: Odis Hollingshead, MD;  Location: [facility];  Service: General;   Laterality: N/A;  . COLONOSCOPY    . CYSTOSCOPY WITH URETEROSCOPY Right 12/05/2012   Procedure: CYSTOSCOPY WITH RIGHT URETEROSCOPY AND STONE EXTRACTION;  Surgeon: Malka So, MD;  Location: [facility];  Service: Urology;  Laterality: Right;  . ESOPHAGOGASTRODUODENOSCOPY N/A 10/01/2012   Procedure: ESOPHAGOGASTRODUODENOSCOPY (EGD);  Surgeon: Garlan Fair, MD;  Location: [facility];  Service: Endoscopy;  Laterality: N/A;  . HERNIA REPAIR    . INGUINAL HERNIA REPAIR  2007   Bilateral   . Lithotripsy for nephrolithiasis     Pt and wife not sure when but here in [place]  . Lysis of Adhesion, small bowel resection  2009  . MAXIMUM ACCESS (MAS)POSTERIOR LUMBAR INTERBODY FUSION (PLIF) 1 LEVEL N/A 05/08/2016   Procedure: LUMBAR FOUR-FIVE FOR MAXIMUM ACCESS (MAS) POSTERIOR LUMBAR INTERBODY FUSION;  Surgeon: Kary Kos, MD;  Location: [facility];  Service: Neurosurgery;  Laterality: N/A;  . NISSEN FUNDOPLICATION     about 7353, pt not sure records not currently available  . TOTAL KNEE ARTHROPLASTY  2003   Bilateral  . TRANSURETHRAL PROSTATECTOMY WITH GYRUS INSTRUMENTS N/A 12/05/2012   Procedure: TRANSURETHRAL PROSTATECTOMY WITH GYRUS INSTRUMENTS;  Surgeon: Malka So, MD;  [facility];  Service: Urology;  Laterality: N/A;  . Ventral Hernia Repair with Mesh      There were no  vitals filed for this visit.      Subjective Assessment - 10/03/16 0807    Subjective Reports overdoing this weekend. Had to walk 2.5 blocks to get to grand-daughters dance recital. Had to stop half way due to extreme LE weakness/fatigue, unable to move feet. After a rest break was able to continue. Did okay once inside building, however had the same feeling when he left the building into outside.( ?heat related). Reports being wiped out the rest of the day (Saturday) and some improvement on Sunday. No falls. No pain to report.                      Pertinent History RA, GI bleed with colectomy, prostate CA, bilat TKA,  Spondylolisthesis at L4-L5, HTN, hiatal hernia, lumbar decompression for stenosis   Limitations Walking   Patient Stated Goals Make the LLE stronger   Currently in Pain? No/denies   Pain Score 0-No pain            [facility] PT Assessment - 10/03/16 0813      Ambulation/Gait   Ambulation/Gait Yes   Ambulation/Gait Assistance 5: Supervision   Ambulation Distance (Feet) 220 Feet   Assistive device None   Gait Pattern Step-through pattern;Decreased stance time - left;Decreased dorsiflexion - left;Decreased weight shift to right;Wide base of support;Poor foot clearance - left   Ambulation Surface Level;Indoor   Gait velocity 11.47 sec's= 2.85 ft/sec without AD   Stairs Yes   Stairs Assistance 6: Modified independent (Device/Increase time)   Stair Management Technique One rail Right;Alternating pattern;Forwards   Number of Stairs 4   Height of Stairs 6     Dynamic Gait Index   Level Surface Normal   Change in Gait Speed Normal   Gait with Horizontal Head Turns Normal   Gait with Vertical Head Turns Normal   Gait and Pivot Turn Mild Impairment   Step Over Obstacle Normal   Step Around Obstacles Normal   Steps Mild Impairment   Total Score 22   DGI comment: 22/24= >22/24= safe ambulators             Balance Exercises - 10/03/16 0828      Balance Exercises: Standing   Standing Eyes Closed Narrow base of support (BOS);Wide (BOA);Head turns;Foam/compliant surface;Other reps (comment);20 secs;Limitations   Other Standing Exercises blue mat over ramp- performed facing both up and down ramp: feet together EC no head movements for 20 sec's x2 reps, progressing to EC head movements left<>right, up<>down x 10 reps each with min guard to min assist for balance. cues on posture and weight shifting for balance assistance.      Balance Exercises: Standing   Standing Eyes Closed Limitations on airex in corner; narrow base of support with EC no head movements, progressing to wide base of  support EC head movements left<>right, up<>down and diagonals both ways x 10 reps each. intermittent UE touch to chair/wall for balance with min guard to min assist for balance.              PT Short Term Goals - 09/18/16 0858      PT SHORT TERM GOAL #1   Title (TARGET DATE FOR ALL STG 09/20/16)  Pt will improve LE functional strength as indicated by 5 times sit to stand =/< 12 seconds   Baseline 11.03 secs without UE support from mat 09/18/16   Time 4   Period Weeks   Status Achieved     PT SHORT TERM  GOAL #2   Title Pt will improve safety with community gait as indicated by increase in gait velocity to = / > 3.28 ft/sec   Baseline 3.56 ft/sec on 09/18/16   Time 4   Period Weeks   Status Achieved     PT SHORT TERM GOAL #3   Title Pt will decrease falls risk during gait as indicated by improvement in DGI to = / > 19/24   Baseline 18/24 on 09/18/16   Time 4   Period Weeks   Status Partially Met     PT SHORT TERM GOAL #4   Title Pt will negotiate 16 stairs with one rail and alternating sequence Mod I without catching/dragging L foot when ascending or descending   Baseline met 09/18/16   Time 4   Period Weeks   Status Achieved           PT Long Term Goals - 08/21/16 2049      PT LONG TERM GOAL #1   Title (TARGET DATE FOR ALL LTG 10/20/2016)  Pt will demonstrate independence with balance/strengthening HEP   Time 8   Period Weeks   Status New     PT LONG TERM GOAL #2   Title Pt will improve gait velocity to >3.59f/sec without AD   Time 8   Period Weeks   Status New     PT LONG TERM GOAL #3   Title Pt will decrease falls risk during gait by improving DGI score to >22/24 without AD   Time 8   Period Weeks   Status New     PT LONG TERM GOAL #4   Title Pt will ambulate >1,000 outside over uneven grassy surfaces while hitting whiffle golf ball x 10 times without LOB   Time 8   Period Weeks   Status New     PT LONG TERM GOAL #5   Title Pt will report recovery from CVA  at >85% on Stroke Impace Scale   Baseline 72%   Time 8   Period Weeks   Status New            Plan - 10/03/16 09741   Clinical Impression Statement Today's session initially focused on retesting of DGI and gait speed for g-code. Pt has improved on the DGI, however his gait speed decreased (still above the fall level value). This could be due to pt reported fatigue after events of the weekend (see subjective section). Remainder of session address balance with emphasis on increased vestibular system imput for balance. No issues were reported with this. Pt is progressing towards goals and should benefit from continued PT to progress toward unmet goals.                          Rehab Potential Good   Clinical Impairments Affecting Rehab Potential Extensive medical history: RA with chronic prednisone use, GI bleed with partial colectomy, prostate CA, bilateral TKA, Spondylolisthesis at L4-L5, HTN, R MCA CVA, Hiatal hernia, recent lumbar decompression for stenosis   PT Frequency 2x / week   PT Duration 8 weeks   PT Treatment/Interventions ADLs/Self Care Home Management;DME Instruction;Gait training;Stair training;Functional mobility training;Therapeutic activities;Therapeutic exercise;Balance training;Neuromuscular re-education;Patient/family education;Orthotic Fit/Training;Taping   PT Next Visit Plan focus on vestibular system-decreasing visual input, compliant surfaces-golf outside in grass   PT Home Exercise Plan Incorporate walking outside, balance with golfing activities on grass   Consulted and Agree with Plan of Care Patient  Patient will benefit from skilled therapeutic intervention in order to improve the following deficits and impairments:  Abnormal gait, Decreased balance, Difficulty walking, Decreased strength, Other (comment)  Visit Diagnosis: Hemiplegia and hemiparesis following cerebral infarction affecting left non-dominant side (HCC)  Muscle weakness  (generalized)  Other abnormalities of gait and mobility  Unsteadiness on feet       G-Codes - 2016-10-26 0924    Functional Assessment Tool Used (Outpatient Only) DGI: 22/24 , gait velocity: 2.85 ft/sec no AD   Functional Limitation Mobility: Walking and moving around   Mobility: Walking and Moving Around Current Status [phone]) At least 1 percent but less than 20 percent impaired, limited or restricted   Mobility: Walking and Moving Around Goal Status [phone]) At least 1 percent but less than 20 percent impaired, limited or restricted     Physical Therapy Progress Note  Dates of Reporting Period: 08/21/16 to 10/04/16  Objective Reports of Subjective Statement: See above  Objective Measurements: DGI, gait velocity  Goal Update: See goals above  Plan: Continue POC and reassess progress by 10/20/16  Reason Skilled Services are Required: To continue to address mm weakness, impaired endurance, impaired gait, balance and decrease falls risk.  G Code and PN entered by: Raylene Everts, PT, DPT 10/04/16    11:09 AM       Problem List Patient Active Problem List   Diagnosis Date Noted  . Paroxysmal atrial fibrillation ([place]) 09/27/2016  . Diabetic neuropathy associated with type 2 diabetes mellitus ([place]) 08/29/2016  . Cardioembolic stroke ([place]) 18/29/9371  . 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 Gait disturbance, post-stroke 08/13/2016  . Left middle cerebral artery stroke ([place]) 08/11/2016  . Acute ischemic stroke ([place]) 08/10/2016  . Hypertensive urgency 08/10/2016  . Right middle cerebral artery stroke ([place]) 08/10/2016  . Acute left-sided weakness   . Left-sided weakness   . History of lower GI bleeding   . Hiatal hernia   . 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 Acute post-hemorrhagic anemia 02/03/2013  . Rheumatoid arthritis ([place]) 04/14/2011  . Long-term current use of  steroids 04/14/2011  . Iron deficiency anemia 04/14/2011    Willow Ora, PTA, [facility] [address], [place], [place] [address] [phone] 10/04/16, 9:25 AM   Name: [name]

## 2016-10-05 ENCOUNTER — Ambulatory Visit: Payer: PPO | Admitting: Physical Therapy

## 2016-10-09 ENCOUNTER — Ambulatory Visit: Payer: PPO | Admitting: Physical Therapy

## 2016-10-09 ENCOUNTER — Encounter: Payer: Self-pay | Admitting: Physical Therapy

## 2016-10-09 DIAGNOSIS — M6281 Muscle weakness (generalized): Secondary | ICD-10-CM

## 2016-10-09 DIAGNOSIS — I69354 Hemiplegia and hemiparesis following cerebral infarction affecting left non-dominant side: Secondary | ICD-10-CM | POA: Diagnosis not present

## 2016-10-09 DIAGNOSIS — R2689 Other abnormalities of gait and mobility: Secondary | ICD-10-CM

## 2016-10-09 NOTE — Therapy (Signed)
[facility] [address] [place], [place], [address] Phone: [phone]   Fax:  [phone]  Physical Therapy Treatment  Patient Details  Name: [name] MRN: [id number] Date of Birth: 1941-01-12 Referring Provider: Dr. Alysia Penna  Encounter Date: 10/09/2016      PT End of Session - 10/09/16 0939    Visit Number 11   Number of Visits 17   Date for PT Re-Evaluation 10/20/16   Authorization Type [employer]-G code and 10th visit progress note   PT Start Time 0845   PT Stop Time 0927   PT Time Calculation (min) 42 min   Equipment Utilized During Treatment Gait belt   Activity Tolerance Patient tolerated treatment well   Behavior During Therapy [facility] for tasks assessed/performed      Past Medical History:  Diagnosis Date  . Blood transfusion    " no reaction from transfusion"  . BPH (benign prostatic hyperplasia)   . Bruises easily   . Bursitis    chronic hip pain  . Cancer [facility])    prostate cancer cells  . Disc disease, degenerative, lumbar or lumbosacral    Chronic back pain now  . Diverticulitis   . Esophageal ring    requiring dilations   . GERD (gastroesophageal reflux disease)   . History of GI bleed    "vessels burst in colon" x2  . History of kidney stones    chronic  . History of vertigo   . Osteoarthritis   . Osteoporosis   . Pneumonia    yrs ago  . Rash    GROIN - ITCHING PAST 3 MONTHS - it is gone now 05/02/16  . Rheumatoid arthritis(714.0)    27 years  . Spondylisthesis   . Stroke ([place]) 07/2016  . Ureteral stone 11/2012    Past Surgical History:  Procedure Laterality Date  . BALLOON DILATION N/A 10/01/2012   Procedure: BALLOON DILATION;  Surgeon: Garlan Fair, MD;  Location: [facility];  Service: Endoscopy;  Laterality: N/A;  . COLECTOMY N/A 06/10/2013   Procedure: TOTAL abdominal COLECTOMY;  Surgeon: Odis Hollingshead, MD;  [facility];   Service: General;  Laterality: N/A;  . COLONOSCOPY    . CYSTOSCOPY WITH URETEROSCOPY Right 12/05/2012   Procedure: CYSTOSCOPY WITH RIGHT URETEROSCOPY AND STONE EXTRACTION;  Surgeon: Malka So, MD;  [facility];  Service: Urology;  Laterality: Right;  . ESOPHAGOGASTRODUODENOSCOPY N/A 10/01/2012   Procedure: ESOPHAGOGASTRODUODENOSCOPY (EGD);  Surgeon: Garlan Fair, MD;  Location: [facility];  Service: Endoscopy;  Laterality: N/A;  . HERNIA REPAIR    . INGUINAL HERNIA REPAIR  2007   Bilateral   . Lithotripsy for nephrolithiasis     Pt and wife not sure when but here in [place]  . Lysis of Adhesion, small bowel resection  2009  . MAXIMUM ACCESS (MAS)POSTERIOR LUMBAR INTERBODY FUSION (PLIF) 1 LEVEL N/A 05/08/2016   Procedure: LUMBAR FOUR-FIVE FOR MAXIMUM ACCESS (MAS) POSTERIOR LUMBAR INTERBODY FUSION;  Surgeon: Kary Kos, MD;  Location: [facility];  Service: Neurosurgery;  Laterality: N/A;  . NISSEN FUNDOPLICATION     about 6468, pt not sure records not currently available  . TOTAL KNEE ARTHROPLASTY  2003   Bilateral  . TRANSURETHRAL PROSTATECTOMY WITH GYRUS INSTRUMENTS N/A 12/05/2012   Procedure: TRANSURETHRAL PROSTATECTOMY WITH GYRUS INSTRUMENTS;  Surgeon: Malka So, MD;  [facility];  Service: Urology;  Laterality: N/A;  . Ventral Hernia Repair with Mesh  There were no vitals filed for this visit.      Subjective Assessment - 10/09/16 0849    Subjective Patient has no new complaints. He reports no new falls.    Pertinent History RA, GI bleed with colectomy, prostate CA, bilat TKA, Spondylolisthesis at L4-L5, HTN, hiatal hernia, lumbar decompression for stenosis   Limitations Walking   Patient Stated Goals Make the LLE stronger   Currently in Pain? No/denies   Pain Score 0-No pain            [facility] Adult PT Treatment/Exercise - 10/09/16 0858      Ambulation/Gait   Ambulation/Gait Yes   Ambulation/Gait Assistance 5: Supervision   Ambulation/Gait Assistance  Details Tendency to lean left while ambulating, especially while going down a slope, but able to self adjust   Ambulation Distance (Feet) 1000 Feet   Assistive device None   Gait Pattern Step-through pattern;Decreased stance time - left;Decreased dorsiflexion - left;Decreased weight shift to right;Wide base of support;Poor foot clearance - left   Ambulation Surface Level;Indoor;Unlevel;Outdoor;Gravel;Grass             Balance Exercises - 10/09/16 0906      Balance Exercises: Standing   Standing Eyes Opened Wide (BOA);Foam/compliant surface;Other reps (comment);Limitations   Standing Eyes Closed Wide (BOA);Foam/compliant surface;Other reps (comment);Limitations   Wall Bumps Hip   Balance Beam standing with feet across blue foam beam with no UE support on bars: alternating heel tapping to floor and back onto foam x 10 reps each side,no UE support with min assist for balance. cues on posture, step height and weight shifting with activity                    Other Standing Exercises blue mat over ramp- performed facing both up and down ramp: standing marching EO head movements left<>right, up<>down x 10 reps each with min guard to min assist for balance. cues on posture and weight shifting for balance assistance.      Balance Exercises: Standing   Standing Eyes Opened Limitations on blue foam, in corner with chair in front for safety, EO 3X30sec, head movement up<>down, left <>right, diagonal both way, 10 reps each; min guard to min assist and occasional UE stabilization using back of chair            Standing Eyes Closed Limitations cueing to distribute weight through feet; wide base of support EC 3X30sec, head movements left<>right, up<>down and diagonals both ways x 10 reps each. intermittent UE touch to chair/wall for balance with min guard to min assist for balance.    Wall Bumps Limitations standing on foam beam, was cued for arm placement during exercise to help regain balance                PT Short Term Goals - 09/18/16 0858      PT SHORT TERM GOAL #1   Title (TARGET DATE FOR ALL STG 09/20/16)  Pt will improve LE functional strength as indicated by 5 times sit to stand =/< 12 seconds   Baseline 11.03 secs without UE support from mat 09/18/16   Time 4   Period Weeks   Status Achieved     PT SHORT TERM GOAL #2   Title Pt will improve safety with community gait as indicated by increase in gait velocity to = / > 3.28 ft/sec   Baseline 3.56 ft/sec on 09/18/16   Time 4   Period Weeks   Status Achieved  PT SHORT TERM GOAL #3   Title Pt will decrease falls risk during gait as indicated by improvement in DGI to = / > 19/24   Baseline 18/24 on 09/18/16   Time 4   Period Weeks   Status Partially Met     PT SHORT TERM GOAL #4   Title Pt will negotiate 16 stairs with one rail and alternating sequence Mod I without catching/dragging L foot when ascending or descending   Baseline met 09/18/16   Time 4   Period Weeks   Status Achieved           PT Long Term Goals - 08/21/16 2049      PT LONG TERM GOAL #1   Title (TARGET DATE FOR ALL LTG 10/20/2016)  Pt will demonstrate independence with balance/strengthening HEP   Time 8   Period Weeks   Status New     PT LONG TERM GOAL #2   Title Pt will improve gait velocity to >3.27f/sec without AD   Time 8   Period Weeks   Status New     PT LONG TERM GOAL #3   Title Pt will decrease falls risk during gait by improving DGI score to >22/24 without AD   Time 8   Period Weeks   Status New     PT LONG TERM GOAL #4   Title Pt will ambulate >1,000 outside over uneven grassy surfaces while hitting whiffle golf ball x 10 times without LOB   Time 8   Period Weeks   Status New     PT LONG TERM GOAL #5   Title Pt will report recovery from CVA at >85% on Stroke Impace Scale   Baseline 72%   Time 8   Period Weeks   Status New               Plan - 10/09/16 0940    Clinical Impression Statement Today's session  continued to focus on balance with decreased visual input to emphasize vestibular training. Patient tolerated treatment well with no new complaints. He continues to progress toward goals and would benefit from continued PT to address residual concerns.             Rehab Potential Good   Clinical Impairments Affecting Rehab Potential Extensive medical history: RA with chronic prednisone use, GI bleed with partial colectomy, prostate CA, bilateral TKA, Spondylolisthesis at L4-L5, HTN, R MCA CVA, Hiatal hernia, recent lumbar decompression for stenosis   PT Frequency 2x / week   PT Duration 8 weeks   PT Treatment/Interventions ADLs/Self Care Home Management;DME Instruction;Gait training;Stair training;Functional mobility training;Therapeutic activities;Therapeutic exercise;Balance training;Neuromuscular re-education;Patient/family education;Orthotic Fit/Training;Taping   PT Next Visit Plan focus on vestibular system-decreasing visual input, compliant surfaces-golf outside in grass   PT Home Exercise Plan Incorporate walking outside, balance with golfing activities on grass   Consulted and Agree with Plan of Care Patient      Patient will benefit from skilled therapeutic intervention in order to improve the following deficits and impairments:  Abnormal gait, Decreased balance, Difficulty walking, Decreased strength, Other (comment)  Visit Diagnosis: Hemiplegia and hemiparesis following cerebral infarction affecting left non-dominant side (HCC)  Muscle weakness (generalized)  Other abnormalities of gait and mobility     Problem List Patient Active Problem List   Diagnosis Date Noted  . Paroxysmal atrial fibrillation ([place] 09/27/2016  . Diabetic neuropathy associated with type 2 diabetes mellitus ([place] 08/29/2016  . Cardioembolic stroke ([place] 098/02/9146 . 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  Gait disturbance, post-stroke 08/13/2016  . Left middle cerebral artery stroke ([place]) 08/11/2016  .  Acute ischemic stroke ([place]) 08/10/2016  . Hypertensive urgency 08/10/2016  . Right middle cerebral artery stroke ([place]) 08/10/2016  . Acute left-sided weakness   . Left-sided weakness   . History of lower GI bleeding   . Hiatal hernia   . 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 Acute post-hemorrhagic anemia 02/03/2013  . Rheumatoid arthritis ([place]) 04/14/2011  . Long-term current use of steroids 04/14/2011  . Iron deficiency anemia 04/14/2011    Skip Estimable, SPTA 10/09/2016, 2:36 PM  [facility] [address] [place], [place], [address] Phone: [phone]   Fax:  [phone]  Name: Agam F [name] MRN: [id number] Date of Birth: [birth year]

## 2016-10-10 ENCOUNTER — Ambulatory Visit: Payer: PPO | Admitting: Physical Therapy

## 2016-10-10 DIAGNOSIS — M0579 Rheumatoid arthritis with rheumatoid factor of multiple sites without organ or systems involvement: Secondary | ICD-10-CM | POA: Diagnosis not present

## 2016-10-10 DIAGNOSIS — M810 Age-related osteoporosis without current pathological fracture: Secondary | ICD-10-CM | POA: Diagnosis not present

## 2016-10-10 DIAGNOSIS — Z79899 Other long term (current) drug therapy: Secondary | ICD-10-CM | POA: Diagnosis not present

## 2016-10-10 DIAGNOSIS — Z6827 Body mass index (BMI) 27.0-27.9, adult: Secondary | ICD-10-CM | POA: Diagnosis not present

## 2016-10-10 DIAGNOSIS — M2550 Pain in unspecified joint: Secondary | ICD-10-CM | POA: Diagnosis not present

## 2016-10-10 DIAGNOSIS — E663 Overweight: Secondary | ICD-10-CM | POA: Diagnosis not present

## 2016-10-12 ENCOUNTER — Ambulatory Visit: Payer: PPO | Admitting: Physical Therapy

## 2016-10-12 DIAGNOSIS — I10 Essential (primary) hypertension: Secondary | ICD-10-CM | POA: Diagnosis not present

## 2016-10-12 DIAGNOSIS — M810 Age-related osteoporosis without current pathological fracture: Secondary | ICD-10-CM | POA: Diagnosis not present

## 2016-10-13 ENCOUNTER — Encounter: Payer: Self-pay | Admitting: Physical Therapy

## 2016-10-13 ENCOUNTER — Ambulatory Visit: Payer: PPO | Admitting: Physical Therapy

## 2016-10-13 DIAGNOSIS — I69354 Hemiplegia and hemiparesis following cerebral infarction affecting left non-dominant side: Secondary | ICD-10-CM

## 2016-10-13 DIAGNOSIS — R2681 Unsteadiness on feet: Secondary | ICD-10-CM

## 2016-10-13 DIAGNOSIS — R2689 Other abnormalities of gait and mobility: Secondary | ICD-10-CM

## 2016-10-14 NOTE — Therapy (Signed)
[facility] [address] [place], [place], [address] Phone: [phone]   Fax:  [phone]  Physical Therapy Treatment  Patient Details  Name: [name] MRN: [id number] Date of Birth: Jul 21, 1940 Referring Provider: Dr. Alysia Penna  Encounter Date: 10/13/2016      PT End of Session - 10/13/16 1537    Visit Number 12   Number of Visits 17   Date for PT Re-Evaluation 10/20/16   Authorization Type [employer]-G code and 10th visit progress note   PT Start Time 1533   PT Stop Time 1615   PT Time Calculation (min) 42 min   Equipment Utilized During Treatment Gait belt   Activity Tolerance Patient tolerated treatment well   Behavior During Therapy [facility] for tasks assessed/performed      Past Medical History:  Diagnosis Date  . Blood transfusion    " no reaction from transfusion"  . BPH (benign prostatic hyperplasia)   . Bruises easily   . Bursitis    chronic hip pain  . Cancer [facility])    prostate cancer cells  . Disc disease, degenerative, lumbar or lumbosacral    Chronic back pain now  . Diverticulitis   . Esophageal ring    requiring dilations   . GERD (gastroesophageal reflux disease)   . History of GI bleed    "vessels burst in colon" x2  . History of kidney stones    chronic  . History of vertigo   . Osteoarthritis   . Osteoporosis   . Pneumonia    yrs ago  . Rash    GROIN - ITCHING PAST 3 MONTHS - it is gone now 05/02/16  . Rheumatoid arthritis(714.0)    27 years  . Spondylisthesis   . Stroke ([place]) 07/2016  . Ureteral stone 11/2012    Past Surgical History:  Procedure Laterality Date  . BALLOON DILATION N/A 10/01/2012   Procedure: BALLOON DILATION;  Surgeon: Garlan Fair, MD;  [facility];  Service: Endoscopy;  Laterality: N/A;  . COLECTOMY N/A 06/10/2013   Procedure: TOTAL abdominal COLECTOMY;  Surgeon: Odis Hollingshead, MD;  [facility];   Service: General;  Laterality: N/A;  . COLONOSCOPY    . CYSTOSCOPY WITH URETEROSCOPY Right 12/05/2012   Procedure: CYSTOSCOPY WITH RIGHT URETEROSCOPY AND STONE EXTRACTION;  Surgeon: Malka So, MD;  [facility];  Service: Urology;  Laterality: Right;  . ESOPHAGOGASTRODUODENOSCOPY N/A 10/01/2012   Procedure: ESOPHAGOGASTRODUODENOSCOPY (EGD);  Surgeon: Garlan Fair, MD;  Location: [facility];  Service: Endoscopy;  Laterality: N/A;  . HERNIA REPAIR    . INGUINAL HERNIA REPAIR  2007   Bilateral   . Lithotripsy for nephrolithiasis     Pt and wife not sure when but here in [place]  . Lysis of Adhesion, small bowel resection  2009  . MAXIMUM ACCESS (MAS)POSTERIOR LUMBAR INTERBODY FUSION (PLIF) 1 LEVEL N/A 05/08/2016   Procedure: LUMBAR FOUR-FIVE FOR MAXIMUM ACCESS (MAS) POSTERIOR LUMBAR INTERBODY FUSION;  Surgeon: Kary Kos, MD;  Location: [facility];  Service: Neurosurgery;  Laterality: N/A;  . NISSEN FUNDOPLICATION     about 7915, pt not sure records not currently available  . TOTAL KNEE ARTHROPLASTY  2003   Bilateral  . TRANSURETHRAL PROSTATECTOMY WITH GYRUS INSTRUMENTS N/A 12/05/2012   Procedure: TRANSURETHRAL PROSTATECTOMY WITH GYRUS INSTRUMENTS;  Surgeon: Malka So, MD;  [facility];  Service: Urology;  Laterality: N/A;  . Ventral Hernia Repair with Mesh  There were no vitals filed for this visit.      Subjective Assessment - 10/13/16 1537    Subjective Patient has no new complaints. He reports no new falls.    Patient is accompained by: Family member   Pertinent History RA, GI bleed with colectomy, prostate CA, bilat TKA, Spondylolisthesis at L4-L5, HTN, hiatal hernia, lumbar decompression for stenosis   Limitations Walking   Patient Stated Goals Make the LLE stronger   Currently in Pain? No/denies   Pain Score 0-No pain           [facility] Adult PT Treatment/Exercise - 10/13/16 1541      Ambulation/Gait   Ambulation/Gait Yes   Ambulation/Gait Assistance 4:  Min guard   Assistive device None   Gait Pattern Step-through pattern;Decreased stance time - left;Decreased dorsiflexion - left;Decreased weight shift to right;Wide base of support;Poor foot clearance - left   Ambulation Surface Level;Indoor   Gait Comments gait along ~50 foot hallway: forward gait with head movements up<>down and left<>right x 4 laps each with min guard for balance. minor veering at times, maintained gait speed.                        Balance Exercises - 10/13/16 1550      Balance Exercises: Standing   Rockerboard Anterior/posterior;Lateral;EO;EC;20 seconds;10 reps   Balance Beam standing with feet across blue foam beam with no UE support on bars: alternating heel tapping to floor and back onto foam and alternating toe taps backwards and back onto foam x 10 reps each bil sides,min assist for balance. cues on posture, step height and weight shifting with activity.                      Balance Exercises: Standing   Rebounder Limitations performed both ways on large balance board with light finger tip support: EO rocking board with emphasis on tall posture and weight shifitng. Progressing to EC no head movements 20 sec's x 3 reps, progressing to EC head movements left<>right and up<>down x 10 reps each. min guard assist for balance. with lateral direction progressed to no UE/finger touch  for EC no head movements for 20 sec's x 3 reps.              PT Short Term Goals - 09/18/16 0858      PT SHORT TERM GOAL #1   Title (TARGET DATE FOR ALL STG 09/20/16)  Pt will improve LE functional strength as indicated by 5 times sit to stand =/< 12 seconds   Baseline 11.03 secs without UE support from mat 09/18/16   Time 4   Period Weeks   Status Achieved     PT SHORT TERM GOAL #2   Title Pt will improve safety with community gait as indicated by increase in gait velocity to = / > 3.28 ft/sec   Baseline 3.56 ft/sec on 09/18/16   Time 4   Period Weeks   Status Achieved     PT  SHORT TERM GOAL #3   Title Pt will decrease falls risk during gait as indicated by improvement in DGI to = / > 19/24   Baseline 18/24 on 09/18/16   Time 4   Period Weeks   Status Partially Met     PT SHORT TERM GOAL #4   Title Pt will negotiate 16 stairs with one rail and alternating sequence Mod I without catching/dragging L foot when ascending or descending  Baseline met 09/18/16   Time 4   Period Weeks   Status Achieved           PT Long Term Goals - 08/21/16 2049      PT LONG TERM GOAL #1   Title (TARGET DATE FOR ALL LTG 10/20/2016)  Pt will demonstrate independence with balance/strengthening HEP   Time 8   Period Weeks   Status New     PT LONG TERM GOAL #2   Title Pt will improve gait velocity to >3.27f/sec without AD   Time 8   Period Weeks   Status New     PT LONG TERM GOAL #3   Title Pt will decrease falls risk during gait by improving DGI score to >22/24 without AD   Time 8   Period Weeks   Status New     PT LONG TERM GOAL #4   Title Pt will ambulate >1,000 outside over uneven grassy surfaces while hitting whiffle golf ball x 10 times without LOB   Time 8   Period Weeks   Status New     PT LONG TERM GOAL #5   Title Pt will report recovery from CVA at >85% on Stroke Impace Scale   Baseline 72%   Time 8   Period Weeks   Status New               Plan - 10/13/16 1538    Clinical Impression Statement Today's skilled session continued to focus on high level balance and activities that require increased vestibular imput for balance. Pt is progressing toward goals and should benefit from continued PT to progress toward unmet goals.   Rehab Potential Good   Clinical Impairments Affecting Rehab Potential Extensive medical history: RA with chronic prednisone use, GI bleed with partial colectomy, prostate CA, bilateral TKA, Spondylolisthesis at L4-L5, HTN, R MCA CVA, Hiatal hernia, recent lumbar decompression for stenosis   PT Frequency 2x / week   PT  Duration 8 weeks   PT Treatment/Interventions ADLs/Self Care Home Management;DME Instruction;Gait training;Stair training;Functional mobility training;Therapeutic activities;Therapeutic exercise;Balance training;Neuromuscular re-education;Patient/family education;Orthotic Fit/Training;Taping   PT Next Visit Plan assess LTG's (due 10/20/16)   Consulted and Agree with Plan of Care Patient      Patient will benefit from skilled therapeutic intervention in order to improve the following deficits and impairments:  Abnormal gait, Decreased balance, Difficulty walking, Decreased strength, Other (comment)  Visit Diagnosis: Hemiplegia and hemiparesis following cerebral infarction affecting left non-dominant side (HCC)  Other abnormalities of gait and mobility  Unsteadiness on feet     Problem List Patient Active Problem List   Diagnosis Date Noted  . Paroxysmal atrial fibrillation ([place] 09/27/2016  . Diabetic neuropathy associated with type 2 diabetes mellitus ([place] 08/29/2016  . Cardioembolic stroke ([place] 046/65/9935 . 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 Gait disturbance, post-stroke 08/13/2016  . Left middle cerebral artery stroke ([place] 08/11/2016  . Acute ischemic stroke ([place] 08/10/2016  . Hypertensive urgency 08/10/2016  . Right middle cerebral artery stroke ([place] 08/10/2016  . Acute left-sided weakness   . Left-sided weakness   . History of lower GI bleeding   . Hiatal hernia   . 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 Acute post-hemorrhagic anemia 02/03/2013  . Rheumatoid arthritis ([place] 04/14/2011  . Long-term current use of steroids 04/14/2011  . Iron deficiency anemia 04/14/2011    KWillow Ora PTA, CLT Outpatient Neuro  [facility] [address] [place], [place] [address] [phone] 10/14/16, 4:34 PM   Name: [name] MRN: [id number] Date of  Birth: 1940-08-26

## 2016-10-16 ENCOUNTER — Ambulatory Visit: Payer: PPO | Attending: Physical Medicine & Rehabilitation | Admitting: Physical Therapy

## 2016-10-16 ENCOUNTER — Encounter: Payer: Self-pay | Admitting: Physical Therapy

## 2016-10-16 DIAGNOSIS — R2681 Unsteadiness on feet: Secondary | ICD-10-CM | POA: Diagnosis not present

## 2016-10-16 DIAGNOSIS — R2689 Other abnormalities of gait and mobility: Secondary | ICD-10-CM

## 2016-10-16 DIAGNOSIS — M6281 Muscle weakness (generalized): Secondary | ICD-10-CM

## 2016-10-16 DIAGNOSIS — I69354 Hemiplegia and hemiparesis following cerebral infarction affecting left non-dominant side: Secondary | ICD-10-CM

## 2016-10-16 NOTE — Therapy (Signed)
[place] [place] [address] [place], [place], [address] Phone: [phone]   Fax:  [phone]  Physical Therapy Treatment and D/C Summary  Patient Details  Name: [name] MRN: [id number] Date of Birth: [birth year] Referring Provider: Dr. Alysia Penna  Encounter Date: 10/16/2016      PT End of Session - 10/16/16 0836    Visit Number 13   Number of Visits 17   Date for PT Re-Evaluation 10/20/16   Authorization Type [employer]-G code and 10th visit progress note   PT Start Time 0802   PT Stop Time 0850   PT Time Calculation (min) 48 min   Activity Tolerance Patient tolerated treatment well   Behavior During Therapy [facility] for tasks assessed/performed      Past Medical History:  Diagnosis Date  . Blood transfusion    " no reaction from transfusion"  . BPH (benign prostatic hyperplasia)   . Bruises easily   . Bursitis    chronic hip pain  . Cancer [facility])    prostate cancer cells  . Disc disease, degenerative, lumbar or lumbosacral    Chronic back pain now  . Diverticulitis   . Esophageal ring    requiring dilations   . GERD (gastroesophageal reflux disease)   . History of GI bleed    "vessels burst in colon" x2  . History of kidney stones    chronic  . History of vertigo   . Osteoarthritis   . Osteoporosis   . Pneumonia    yrs ago  . Rash    GROIN - ITCHING PAST 3 MONTHS - it is gone now 05/02/16  . Rheumatoid arthritis(714.0)    27 years  . Spondylisthesis   . Stroke ([place]) 07/2016  . Ureteral stone 11/2012    Past Surgical History:  Procedure Laterality Date  . BALLOON DILATION N/A 10/01/2012   Procedure: BALLOON DILATION;  Surgeon: Garlan Fair, MD;  [facility];  Service: Endoscopy;  Laterality: N/A;  . COLECTOMY N/A 06/10/2013   Procedure: TOTAL abdominal COLECTOMY;  Surgeon: Odis Hollingshead, MD;  [facility];  Service: General;  Laterality: N/A;  .  COLONOSCOPY    . CYSTOSCOPY WITH URETEROSCOPY Right 12/05/2012   Procedure: CYSTOSCOPY WITH RIGHT URETEROSCOPY AND STONE EXTRACTION;  Surgeon: Malka So, MD;  [facility];  Service: Urology;  Laterality: Right;  . ESOPHAGOGASTRODUODENOSCOPY N/A 10/01/2012   Procedure: ESOPHAGOGASTRODUODENOSCOPY (EGD);  Surgeon: Garlan Fair, MD;  [facility];  Service: Endoscopy;  Laterality: N/A;  . HERNIA REPAIR    . INGUINAL HERNIA REPAIR  2007   Bilateral   . Lithotripsy for nephrolithiasis     Pt and wife not sure when but here in [place]  . Lysis of Adhesion, small bowel resection  2009  . MAXIMUM ACCESS (MAS)POSTERIOR LUMBAR INTERBODY FUSION (PLIF) 1 LEVEL N/A 05/08/2016   Procedure: LUMBAR FOUR-FIVE FOR MAXIMUM ACCESS (MAS) POSTERIOR LUMBAR INTERBODY FUSION;  Surgeon: Kary Kos, MD;  Location: [facility];  Service: Neurosurgery;  Laterality: N/A;  . NISSEN FUNDOPLICATION     about 7322, pt not sure records not currently available  . TOTAL KNEE ARTHROPLASTY  2003   Bilateral  . TRANSURETHRAL PROSTATECTOMY WITH GYRUS INSTRUMENTS N/A 12/05/2012   Procedure: TRANSURETHRAL PROSTATECTOMY WITH GYRUS INSTRUMENTS;  Surgeon: Malka So, MD;  [facility];  Service: Urology;  Laterality: N/A;  . Ventral Hernia Repair with Mesh      There were no  vitals filed for this visit.      Subjective Assessment - 10/16/16 0805    Subjective Pt reporting having Laryingitis this weekend, sore throat but able to participate.  Needs to cancel Thursday's appointment.  Asking about time line for recovery.   Pertinent History RA, GI bleed with colectomy, prostate CA, bilat TKA, Spondylolisthesis at L4-L5, HTN, hiatal hernia, lumbar decompression for stenosis   Limitations Walking   Patient Stated Goals Make the LLE stronger   Currently in Pain? No/denies            [facility] PT Assessment - 10/16/16 0808      Assessment   Medical Diagnosis R CVA     Observation/Other Assessments   Focus on  Therapeutic Outcomes (FOTO)  73 (27% limited)   Other Surveys  Other Surveys   Stroke Impact Scale  88.9%   Fear Avoidance Belief Questionnaire (FABQ)  26     Ambulation/Gait   Ambulation/Gait Yes   Ambulation/Gait Assistance 6: Modified independent (Device/Increase time)   Ambulation/Gait Assistance Details Gait over grassy terrain while hitting golf ball across grass Mod I with no LOB   Ambulation Distance (Feet) 1000 Feet   Assistive device None   Gait Pattern Step-through pattern;Poor foot clearance - left;Wide base of support;Poor foot clearance - right   Ambulation Surface Outdoor;Paved;Unlevel;Grass   Gait velocity 3.57f/sec   Stairs Yes   Stairs Assistance 6: Modified independent (Device/Increase time)   Stair Management Technique No rails;Alternating pattern;Forwards   Number of Stairs 4   Height of Stairs 6   Curb 6: Modified independent (Device/increase time)     Standardized Balance Assessment   Standardized Balance Assessment Dynamic Gait Index;10 meter walk test   10 Meter Walk 9.72 seconds or 3.37 ft/sec     Dynamic Gait Index   Level Surface Normal   Change in Gait Speed Normal   Gait with Horizontal Head Turns Normal   Gait with Vertical Head Turns Normal   Gait and Pivot Turn Normal   Step Over Obstacle Normal   Step Around Obstacles Normal   Steps Normal   Total Score 24   DGI comment: 24/24                             PT Education - 10/16/16 1227    Education provided Yes   Education Details Progress, D/C today, continue with current HEP   Person(s) Educated Patient   Methods Explanation   Comprehension Verbalized understanding          PT Short Term Goals - 09/18/16 0858      PT SHORT TERM GOAL #1   Title (TARGET DATE FOR ALL STG 09/20/16)  Pt will improve LE functional strength as indicated by 5 times sit to stand =/< 12 seconds   Baseline 11.03 secs without UE support from mat 09/18/16   Time 4   Period Weeks   Status  Achieved     PT SHORT TERM GOAL #2   Title Pt will improve safety with community gait as indicated by increase in gait velocity to = / > 3.28 ft/sec   Baseline 3.56 ft/sec on 09/18/16   Time 4   Period Weeks   Status Achieved     PT SHORT TERM GOAL #3   Title Pt will decrease falls risk during gait as indicated by improvement in DGI to = / > 19/24   Baseline 18/24 on 09/18/16  Time 4   Period Weeks   Status Partially Met     PT SHORT TERM GOAL #4   Title Pt will negotiate 16 stairs with one rail and alternating sequence Mod I without catching/dragging L foot when ascending or descending   Baseline met 09/18/16   Time 4   Period Weeks   Status Achieved           PT Long Term Goals - 25-Oct-2016 0818      PT LONG TERM GOAL #1   Title (TARGET DATE FOR ALL LTG 10/20/2016)  Pt will demonstrate independence with balance/strengthening HEP   Status Achieved     PT LONG TERM GOAL #2   Title Pt will improve gait velocity to >3.72f/sec without AD   Baseline Progressed to 3.313fsec; just not to goal level   Status Partially Met     PT LONG TERM GOAL #3   Title Pt will decrease falls risk during gait by improving DGI score to >22/24 without AD   Baseline 24/24   Status Achieved     PT LONG TERM GOAL #4   Title Pt will ambulate >1,000 outside over uneven grassy surfaces while hitting whiffle golf ball x 10 times without LOB   Baseline met   Status Achieved     PT LONG TERM GOAL #5   Title Pt will report recovery from CVA at >85% on Stroke Impace Scale   Baseline 88%   Time 8   Period Weeks   Status Achieved               Plan - 0707/11/2018228    Clinical Impression Statement Treatment session today focused on assessment of all LTG as pt needs to cancel final visit on Thursday.  Pt has met 4/5 LTG.  Pt partially met gait velocity goal-gait velocity has improved but not to goal level of 3.5.  Pt is safe to ambulate in community with gait velocity of 3.37.  Pt has demonstrated  improvements in dynamic balance, gait, has decreased his falls risk and demonstrates improved safety with gait outside on uneven surfaces.  Pt encouraged to continue with current HEP and [facility].   Rehab Potential Good   Clinical Impairments Affecting Rehab Potential Extensive medical history: RA with chronic prednisone use, GI bleed with partial colectomy, prostate CA, bilateral TKA, Spondylolisthesis at L4-L5, HTN, R MCA CVA, Hiatal hernia, recent lumbar decompression for stenosis   PT Frequency 2x / week   PT Duration 8 weeks   PT Treatment/Interventions ADLs/Self Care Home Management;DME Instruction;Gait training;Stair training;Functional mobility training;Therapeutic activities;Therapeutic exercise;Balance training;Neuromuscular re-education;Patient/family education;Orthotic Fit/Training;Taping   PT Next Visit Plan D/C today   Consulted and Agree with Plan of Care Patient      Patient will benefit from skilled therapeutic intervention in order to improve the following deficits and impairments:  Abnormal gait, Decreased balance, Difficulty walking, Decreased strength, Other (comment)  Visit Diagnosis: Hemiplegia and hemiparesis following cerebral infarction affecting left non-dominant side (HCC)  Other abnormalities of gait and mobility  Unsteadiness on feet  Muscle weakness (generalized)       G-Codes - [id number]    Functional Assessment Tool Used (Outpatient Only) DGI: 24/24, gait velocity 3.3771fec no AD   Functional Limitation Mobility: Walking and moving around   Mobility: Walking and Moving Around Goal Status ([id number] 1531t least 1 percent but less than 20 percent impaired, limited or restricted   Mobility: Walking  and Moving Around Discharge Status [id number]) At least 1 percent but less than 20 percent impaired, limited or restricted      Problem List Patient Active Problem List   Diagnosis Date Noted  .  Paroxysmal atrial fibrillation ([place]) 09/27/2016  . Diabetic neuropathy associated with type 2 diabetes mellitus ([place]) 08/29/2016  . Cardioembolic stroke ([place]) 72/82/0601  . 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 Gait disturbance, post-stroke 08/13/2016  . 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 Acute ischemic stroke ([place]) 08/10/2016  . Hypertensive urgency 08/10/2016  . 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 Acute left-sided weakness   . Left-sided weakness   . History of lower GI bleeding   . Hiatal hernia   . 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 Acute post-hemorrhagic anemia 02/03/2013  . Rheumatoid arthritis ([place]) 04/14/2011  . Long-term current use of steroids 04/14/2011  . Iron deficiency anemia 04/14/2011  PHYSICAL THERAPY DISCHARGE SUMMARY  Visits from Start of Care: 13  Current functional level related to goals / functional outcomes: See goal achievement above   Remaining deficits: Impaired balance   Education / Equipment: HEP  Plan: Patient agrees to discharge.  Patient goals were met. Patient is being discharged due to meeting the stated rehab goals.  ?????     Raylene Everts, PT, DPT 10/16/16    12:33 PM    [facility] [address] [place], [place], [address] Phone: [phone]   Fax:  [phone]  Name: [name] MRN: [id number] Date of Birth: May 26, 1940

## 2016-10-19 ENCOUNTER — Ambulatory Visit: Payer: PPO | Admitting: Physical Therapy

## 2016-10-19 DIAGNOSIS — I10 Essential (primary) hypertension: Secondary | ICD-10-CM | POA: Diagnosis not present

## 2016-10-19 DIAGNOSIS — I639 Cerebral infarction, unspecified: Secondary | ICD-10-CM | POA: Diagnosis not present

## 2016-10-19 DIAGNOSIS — Z8546 Personal history of malignant neoplasm of prostate: Secondary | ICD-10-CM | POA: Diagnosis not present

## 2016-10-19 DIAGNOSIS — M810 Age-related osteoporosis without current pathological fracture: Secondary | ICD-10-CM | POA: Diagnosis not present

## 2016-10-19 DIAGNOSIS — M069 Rheumatoid arthritis, unspecified: Secondary | ICD-10-CM | POA: Diagnosis not present

## 2016-11-29 DIAGNOSIS — I639 Cerebral infarction, unspecified: Secondary | ICD-10-CM | POA: Diagnosis not present

## 2016-11-29 DIAGNOSIS — I10 Essential (primary) hypertension: Secondary | ICD-10-CM | POA: Diagnosis not present

## 2016-11-29 DIAGNOSIS — Z8546 Personal history of malignant neoplasm of prostate: Secondary | ICD-10-CM | POA: Diagnosis not present

## 2016-11-29 DIAGNOSIS — M069 Rheumatoid arthritis, unspecified: Secondary | ICD-10-CM | POA: Diagnosis not present

## 2016-11-29 DIAGNOSIS — M810 Age-related osteoporosis without current pathological fracture: Secondary | ICD-10-CM | POA: Diagnosis not present

## 2016-12-14 DIAGNOSIS — M810 Age-related osteoporosis without current pathological fracture: Secondary | ICD-10-CM | POA: Diagnosis not present

## 2017-01-10 DIAGNOSIS — Z79899 Other long term (current) drug therapy: Secondary | ICD-10-CM | POA: Diagnosis not present

## 2017-01-10 DIAGNOSIS — D649 Anemia, unspecified: Secondary | ICD-10-CM | POA: Diagnosis not present

## 2017-01-10 DIAGNOSIS — I10 Essential (primary) hypertension: Secondary | ICD-10-CM | POA: Diagnosis not present

## 2017-01-10 DIAGNOSIS — R7301 Impaired fasting glucose: Secondary | ICD-10-CM | POA: Diagnosis not present

## 2017-01-10 DIAGNOSIS — R51 Headache: Secondary | ICD-10-CM | POA: Diagnosis not present

## 2017-01-15 DIAGNOSIS — M810 Age-related osteoporosis without current pathological fracture: Secondary | ICD-10-CM | POA: Diagnosis not present

## 2017-01-15 DIAGNOSIS — Z6827 Body mass index (BMI) 27.0-27.9, adult: Secondary | ICD-10-CM | POA: Diagnosis not present

## 2017-01-15 DIAGNOSIS — M2550 Pain in unspecified joint: Secondary | ICD-10-CM | POA: Diagnosis not present

## 2017-01-15 DIAGNOSIS — E663 Overweight: Secondary | ICD-10-CM | POA: Diagnosis not present

## 2017-01-15 DIAGNOSIS — Z79899 Other long term (current) drug therapy: Secondary | ICD-10-CM | POA: Diagnosis not present

## 2017-01-15 DIAGNOSIS — M0579 Rheumatoid arthritis with rheumatoid factor of multiple sites without organ or systems involvement: Secondary | ICD-10-CM | POA: Diagnosis not present

## 2017-01-31 DIAGNOSIS — Z23 Encounter for immunization: Secondary | ICD-10-CM | POA: Diagnosis not present

## 2017-01-31 DIAGNOSIS — I10 Essential (primary) hypertension: Secondary | ICD-10-CM | POA: Diagnosis not present

## 2017-01-31 DIAGNOSIS — Z79899 Other long term (current) drug therapy: Secondary | ICD-10-CM | POA: Diagnosis not present

## 2017-03-21 ENCOUNTER — Telehealth: Payer: Self-pay | Admitting: Diagnostic Neuroimaging

## 2017-03-21 NOTE — Telephone Encounter (Signed)
Dr. Kathaleen Grinder office called wanting to know if the patient could come off eliquis in order for him to have multiple teeth distracted at one visit. Their phone number is [phone]. You can ask for either Threasa Beards or Baxter Flattery.

## 2017-03-21 NOTE — Telephone Encounter (Signed)
"  There may be a small risk of stroke by stopping anticoagulation therapy in the peri-procedural period. If patient agrees to these risks, then patient may come off (eliquis for 3 days) in preparation for procedure, and restarted as soon as possible post-procedure."  Penni Bombard, MD 16/05/4467, 5:07 PM Certified in Neurology, Neurophysiology and Neuroimaging  [facility] [address] [place], [place] [address] [phone]

## 2017-03-21 NOTE — Telephone Encounter (Signed)
Spoke with patient and gave him Dr Gladstone Lighter specific instructions, recommendations regarding eliquis and dental extractions. Advised him this RN will call dentist's office tomorrow as well. Patient then asked if he could ever go off eliquis; this RN advised he may have to take for lifetime.  He then stated he is having side effects from some of his medications; he couldn't tell this RN which meds or what side effects. This RN advised he discuss with PCP. Patient stated his PCP has a nurse or pharmacist that he has talked to about his medicaitons, and some have been changed. Patient then asked about his neuropathy, and stated he had discussed with Dr Leta Baptist in the past.  This RN advised he may be seen again; Dr Leta Baptist is booking in May, but he can be put on wait list. Patient stated he would wait until the first of the year and call back if he wants to be seen. He verbalized understanding, appreciation of call.  This RN will call dentist's office tomorrow as it is now closed.

## 2017-03-22 NOTE — Telephone Encounter (Signed)
Spoke with Threasa Beards in Dr [employer] office and read verbatim her Dr Gladstone Lighter note. She repeated and verbalized understanding, stated she would inform Dr Kalman Shan. Advised her this RN had spoken with patient last night, and he was informed of exact same message from Dr Leta Baptist. She verbalized appreciation of call.

## 2017-04-19 DIAGNOSIS — M2550 Pain in unspecified joint: Secondary | ICD-10-CM | POA: Diagnosis not present

## 2017-04-19 DIAGNOSIS — E663 Overweight: Secondary | ICD-10-CM | POA: Diagnosis not present

## 2017-04-19 DIAGNOSIS — M0579 Rheumatoid arthritis with rheumatoid factor of multiple sites without organ or systems involvement: Secondary | ICD-10-CM | POA: Diagnosis not present

## 2017-04-19 DIAGNOSIS — M810 Age-related osteoporosis without current pathological fracture: Secondary | ICD-10-CM | POA: Diagnosis not present

## 2017-04-19 DIAGNOSIS — Z6828 Body mass index (BMI) 28.0-28.9, adult: Secondary | ICD-10-CM | POA: Diagnosis not present

## 2017-04-19 DIAGNOSIS — Z79899 Other long term (current) drug therapy: Secondary | ICD-10-CM | POA: Diagnosis not present

## 2017-05-23 ENCOUNTER — Inpatient Hospital Stay: Payer: PPO | Attending: Oncology

## 2017-05-23 DIAGNOSIS — E611 Iron deficiency: Secondary | ICD-10-CM | POA: Insufficient documentation

## 2017-05-23 DIAGNOSIS — D472 Monoclonal gammopathy: Secondary | ICD-10-CM | POA: Diagnosis not present

## 2017-05-23 DIAGNOSIS — M069 Rheumatoid arthritis, unspecified: Secondary | ICD-10-CM | POA: Diagnosis not present

## 2017-05-23 DIAGNOSIS — G629 Polyneuropathy, unspecified: Secondary | ICD-10-CM | POA: Diagnosis not present

## 2017-05-23 LAB — COMPREHENSIVE METABOLIC PANEL
ALT: 7 U/L (ref 0–55)
AST: 14 U/L (ref 5–34)
Albumin: 3.3 g/dL — ABNORMAL LOW (ref 3.5–5.0)
Alkaline Phosphatase: 71 U/L (ref 40–150)
Anion gap: 7 (ref 3–11)
BUN: 15 mg/dL (ref 7–26)
CO2: 27 mmol/L (ref 22–29)
CREATININE: 1.27 mg/dL (ref 0.70–1.30)
Calcium: 9 mg/dL (ref 8.4–10.4)
Chloride: 108 mmol/L (ref 98–109)
GFR calc Af Amer: >60 mL/min (ref >60)
GFR calc non Af Amer: 53 mL/min — ABNORMAL LOW (ref >60)
Glucose, Bld: 92 mg/dL (ref 70–140)
Potassium: 4 mmol/L (ref 3.5–5.1)
SODIUM: 142 mmol/L (ref 136–145)
Total Bilirubin: 0.5 mg/dL (ref 0.2–1.2)
Total Protein: 7.2 g/dL (ref 6.4–8.3)

## 2017-05-23 LAB — CBC WITH DIFFERENTIAL/PLATELET
BASOS ABS: 0.1 10*3/uL (ref 0.0–0.1)
BASOS PCT: 1 %
EOS ABS: 0.5 10*3/uL (ref 0.0–0.5)
Eosinophils Relative: 7 %
HCT: 37.9 % — ABNORMAL LOW (ref 38.4–49.9)
Hemoglobin: 12.2 g/dL — ABNORMAL LOW (ref 13.0–17.1)
Lymphocytes Relative: 20 %
Lymphs Abs: 1.5 10*3/uL (ref 0.9–3.3)
MCH: 25.7 pg — ABNORMAL LOW (ref 27.2–33.4)
MCHC: 32.1 g/dL (ref 32.0–36.0)
MCV: 80.1 fL (ref 79.3–98.0)
Monocytes Absolute: 1 10*3/uL — ABNORMAL HIGH (ref 0.1–0.9)
Monocytes Relative: 14 %
Neutro Abs: 4.5 10*3/uL (ref 1.5–6.5)
Neutrophils Relative %: 58 %
PLATELETS: 287 10*3/uL (ref 140–400)
RBC: 4.73 MIL/uL (ref 4.20–5.82)
RDW: 16 % — ABNORMAL HIGH (ref 11.0–14.6)
WBC: 7.6 10*3/uL (ref 4.0–10.3)

## 2017-05-24 LAB — KAPPA/LAMBDA LIGHT CHAINS
KAPPA, LAMDA LIGHT CHAIN RATIO: 0.1 — AB (ref 0.26–1.65)
Kappa free light chain: 19.1 mg/L (ref 3.3–19.4)
Lambda free light chains: 189 mg/L — ABNORMAL HIGH (ref 5.7–26.3)

## 2017-05-28 LAB — MULTIPLE MYELOMA PANEL, SERUM
ALPHA 1: 0.2 g/dL (ref 0.0–0.4)
ALPHA2 GLOB SERPL ELPH-MCNC: 0.7 g/dL (ref 0.4–1.0)
Albumin SerPl Elph-Mcnc: 3.3 g/dL (ref 2.9–4.4)
Albumin/Glob SerPl: 1 (ref 0.7–1.7)
B-Globulin SerPl Elph-Mcnc: 2 g/dL — ABNORMAL HIGH (ref 0.7–1.3)
Gamma Glob SerPl Elph-Mcnc: 0.5 g/dL (ref 0.4–1.8)
Globulin, Total: 3.5 g/dL (ref 2.2–3.9)
IGA: 95 mg/dL (ref 61–437)
IGG (IMMUNOGLOBIN G), SERUM: 1527 mg/dL (ref 700–1600)
IgM (Immunoglobulin M), Srm: 79 mg/dL (ref 15–143)
M Protein SerPl Elph-Mcnc: 1.5 g/dL — ABNORMAL HIGH
TOTAL PROTEIN ELP: 6.8 g/dL (ref 6.0–8.5)

## 2017-05-30 ENCOUNTER — Ambulatory Visit: Payer: PPO | Admitting: Oncology

## 2017-06-12 ENCOUNTER — Inpatient Hospital Stay (HOSPITAL_BASED_OUTPATIENT_CLINIC_OR_DEPARTMENT_OTHER): Payer: PPO | Admitting: Oncology

## 2017-06-12 ENCOUNTER — Telehealth: Payer: Self-pay | Admitting: Oncology

## 2017-06-12 VITALS — BP 150/69 | HR 73 | Temp 98.6°F | Resp 20 | Ht 67.0 in | Wt 185.8 lb

## 2017-06-12 DIAGNOSIS — M069 Rheumatoid arthritis, unspecified: Secondary | ICD-10-CM

## 2017-06-12 DIAGNOSIS — D472 Monoclonal gammopathy: Secondary | ICD-10-CM

## 2017-06-12 DIAGNOSIS — E611 Iron deficiency: Secondary | ICD-10-CM | POA: Diagnosis not present

## 2017-06-12 DIAGNOSIS — G629 Polyneuropathy, unspecified: Secondary | ICD-10-CM | POA: Diagnosis not present

## 2017-06-12 NOTE — Progress Notes (Signed)
Hematology and Oncology Follow Up Visit  [name] [id number] [birth year] 77 y.o. 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  He presented with IgG subtype with M spike of 2 g/dL and 8% plasma cell involvement in the bone marrow.  No endorgan damage detected.   Prior Therapy: Status post bone marrow biopsy February 2012.  Current therapy: Active surveillance.  Interim History: [name] is here for a follow-up visit.  Since the last visit, he reports no major changes in his health but does report occasional flare of his rheumatoid arthritis.  Continues to be on prednisone for that but does report joint swelling predominantly in his hands.  Despite that, he remains active and attends to activities of daily living including exercise in the gym.  He denies any bone pain, pathological fractures or neuropathy.  He denies any recurrent infections.  His appetite and performance status remain unchanged.  He does not report any decline in his quality of life.  He does not report any headaches, blurry vision, syncope or seizures. He does not report any fevers, chills or sweats. He does not report any cough, wheezing or hemoptysis.  He does not report any chest pain, palpitation, orthopnea or leg edema.  He does not report any nausea, vomiting or abdominal pain. He does not report any frequency urgency or hesitancy.  He does not report any skin rashes or lesions.  He does not report any lymphadenopathy or petechiae. Remaining review of systems is negative.  Medications: I have reviewed the patient's current medications.  Current Outpatient Medications  Medication Sig Dispense Refill  . amLODipine (NORVASC) 5 MG tablet Take 5 mg by mouth daily.    Marland Kitchen apixaban (ELIQUIS STARTER PACK) 5 MG TABS tablet Take 1 tablet (5 mg total) by mouth 2 (two) times daily. (Patient taking differently: Take 5 mg by mouth daily. ) 60 tablet 2   . ferrous sulfate 325 (65 FE) MG tablet Take 1 tablet (325 mg total) by mouth 2 (two) times daily with a meal. 60 tablet 3  . leflunomide (ARAVA) 20 MG tablet Take 20 mg by mouth daily.     Marland Kitchen lisinopril (PRINIVIL,ZESTRIL) 5 MG tablet     . omeprazole (PRILOSEC) 20 MG capsule Take 1 capsule (20 mg total) by mouth daily. 30 capsule 1  . predniSONE (DELTASONE) 5 MG tablet Take 1 tablet (5 mg total) by mouth daily. 30 tablet 1  . tetrahydrozoline-zinc (VISINE-AC) 0.05-0.25 % ophthalmic solution Place 2 drops into both eyes 3 (three) times daily as needed (for allergies).    . traMADol (ULTRAM) 50 MG tablet Take 1 tablet (50 mg total) by mouth every 6 (six) hours as needed for moderate pain or severe pain. 20 tablet 0   No current facility-administered medications for this visit.      Allergies:  Allergies  Allergen Reactions  . Clindamycin/Lincomycin Hives, Shortness Of Breath and Swelling  . Enbrel [Etanercept] Swelling    SEVERE LEG SWELLING   . Penicillins Hives and Shortness Of Breath    Has patient had a PCN reaction causing immediate rash, facial/tongue/throat swelling, SOB or lightheadedness with hypotension: Yes Has patient had a PCN reaction causing severe rash involving mucus membranes or skin necrosis: Yes Has patient had a PCN reaction that required hospitalization No Has patient had a PCN reaction occurring within the last 10 years: No If all of the above answers are "NO", then may proceed  with Cephalosporin use.   . Codeine Hives and Swelling    Past Medical History, Surgical history, Social history, and Family History were reviewed and updated.  Marland Kitchen Physical Exam: Blood pressure (!) 150/69, pulse 73, temperature 98.6 F (37 C), temperature source Oral, resp. rate 20, height 5' 7"  (1.702 m), weight 185 lb 12.8 oz (84.3 kg), SpO2 99 %. ECOG: 0 General appearance: alert and cooperative appeared without distress. Head: Atraumatic without any abnormalities. Oropharynx:  Without thrush or ulcers. Eyes: No scleral icterus. Heart:regular rate and rhythm, S1, S2 normal, no murmur, click, rub or gallop Lung: Clear in all lung fields without rhonchi, wheezes or dullness to percussion. Abdomin: Soft, nontender without any rebound or guarding. Musculoskeletal: Slight swelling noted in his wrists and fingers.  No joint deformity or effusion. Skin: No rashes or lesions. Neurological: No motor, sensory deficits.   Lab Results: Lab Results  Component Value Date   WBC 7.6 05/23/2017   HGB 12.2 (L) 05/23/2017   HCT 37.9 (L) 05/23/2017   MCV 80.1 05/23/2017   PLT 287 05/23/2017     Chemistry      Component Value Date/Time   NA 142 05/23/2017 0825   K 4.0 05/23/2017 0825   CL 108 05/23/2017 0825   CO2 27 05/23/2017 0825   BUN 15 05/23/2017 0825   CREATININE 1.27 05/23/2017 0825      Component Value Date/Time   CALCIUM 9.0 05/23/2017 0825   ALKPHOS 71 05/23/2017 0825   AST 14 05/23/2017 0825   ALT 7 05/23/2017 0825   BILITOT 0.5 05/23/2017 0825      Results for [name], [name] F (MRN [id number]) as of 06/12/2017 09:03  Ref. Range 05/23/2017 08:25 05/23/2017 08:26  IgG (Immunoglobin G), Serum Latest Ref Range: 700 - 1,600 mg/dL  1,527  IgA Latest Ref Range: 61 - 437 mg/dL  95  IgM (Immunoglobulin M), Srm Latest Ref Range: 15 - 143 mg/dL  79  Kappa free light chain Latest Ref Range: 3.3 - 19.4 mg/L 19.1   Lamda free light chains Latest Ref Range: 5.7 - 26.3 mg/L 189.0 (H)   Kappa, lamda light chain ratio Latest Ref Range: 0.26 - 1.65  0.10 (L)      Impression and Plan:   77 year old gentleman with the following issues:  1. IgG lambda MGUS diagnosed in 2012 without any endorgan damage and 8% plasma cells in the bone marrow.  Protein studies obtained on February 6 of 2019 were personally reviewed today and discussed with the patient.  His M spike of 1.5 and IgG level of 1500 is not different than his initial diagnosis in 2012.  He has normal hemoglobin  and kidney function as well as electrolytes.  The rationale for continued observation and surveillance was discussed today with the patient and the risk of progression to multiple myeloma was also reviewed.  He continues to be a low risk close to 1 %/year but active surveillance is recommended.  He is agreeable to continue at this time.   2. The peripheral neuropathy: Related to plasma cell disorder.  3.  Iron deficiency: I recommended continuing iron supplements as he is doing.  4. Follow-up: Will be in in one year.  15  minutes was spent with the patient face-to-face today.  More than 50% of time was dedicated to patient counseling, education and coordination of his care.      Zola Button, MD 2/26/20198:58 AM

## 2017-06-12 NOTE — Telephone Encounter (Signed)
Appointments scheduled AVS/Calendar printed per 2/26 los °

## 2017-06-26 DIAGNOSIS — M810 Age-related osteoporosis without current pathological fracture: Secondary | ICD-10-CM | POA: Diagnosis not present

## 2017-07-16 DIAGNOSIS — H2513 Age-related nuclear cataract, bilateral: Secondary | ICD-10-CM | POA: Diagnosis not present

## 2017-07-24 DIAGNOSIS — Z79899 Other long term (current) drug therapy: Secondary | ICD-10-CM | POA: Diagnosis not present

## 2017-07-24 DIAGNOSIS — Z6828 Body mass index (BMI) 28.0-28.9, adult: Secondary | ICD-10-CM | POA: Diagnosis not present

## 2017-07-24 DIAGNOSIS — M810 Age-related osteoporosis without current pathological fracture: Secondary | ICD-10-CM | POA: Diagnosis not present

## 2017-07-24 DIAGNOSIS — E663 Overweight: Secondary | ICD-10-CM | POA: Diagnosis not present

## 2017-07-24 DIAGNOSIS — M2550 Pain in unspecified joint: Secondary | ICD-10-CM | POA: Diagnosis not present

## 2017-07-24 DIAGNOSIS — M0579 Rheumatoid arthritis with rheumatoid factor of multiple sites without organ or systems involvement: Secondary | ICD-10-CM | POA: Diagnosis not present

## 2017-08-01 DIAGNOSIS — M069 Rheumatoid arthritis, unspecified: Secondary | ICD-10-CM | POA: Diagnosis not present

## 2017-08-01 DIAGNOSIS — I10 Essential (primary) hypertension: Secondary | ICD-10-CM | POA: Diagnosis not present

## 2017-08-01 DIAGNOSIS — E7800 Pure hypercholesterolemia, unspecified: Secondary | ICD-10-CM | POA: Diagnosis not present

## 2017-08-01 DIAGNOSIS — N183 Chronic kidney disease, stage 3 (moderate): Secondary | ICD-10-CM | POA: Diagnosis not present

## 2017-08-01 DIAGNOSIS — R7303 Prediabetes: Secondary | ICD-10-CM | POA: Diagnosis not present

## 2017-08-01 DIAGNOSIS — I129 Hypertensive chronic kidney disease with stage 1 through stage 4 chronic kidney disease, or unspecified chronic kidney disease: Secondary | ICD-10-CM | POA: Diagnosis not present

## 2017-08-01 DIAGNOSIS — G629 Polyneuropathy, unspecified: Secondary | ICD-10-CM | POA: Diagnosis not present

## 2017-08-01 DIAGNOSIS — I480 Paroxysmal atrial fibrillation: Secondary | ICD-10-CM | POA: Diagnosis not present

## 2017-09-07 IMAGING — MR MR MRA HEAD W/O CM
9 of 11 series · 30 of 48 positions shown · non-contrast
Comparison: CTA head and neck 08/09/2016

CLINICAL DATA: Acute left-sided weakness

EXAM:
MRI HEAD WITHOUT CONTRAST
MRA HEAD WITHOUT CONTRAST
TECHNIQUE: Multiplanar, multiecho pulse sequences of the brain and surrounding
structures were obtained without intravenous contrast. Angiographic
images of the head were obtained using MRA technique without
contrast.

[Series 2: FLAIR · sagittal · 5.0mm · 0.47mm/px · 2 of 23 slices shown (1 of 2)]
[im 1/23]
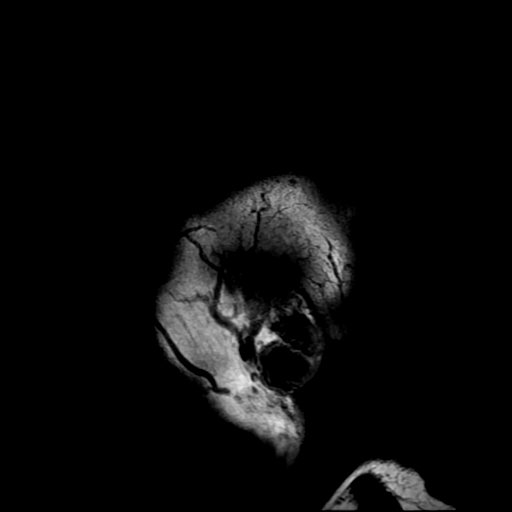
[im 23/23]
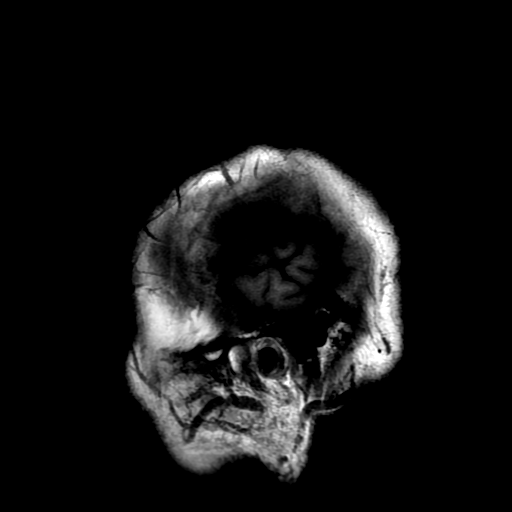

[Series 4: DWI · axial · 3.0mm · 0.94mm/px · z∈[-88,+57]mm · 7 of 100 slices shown (1 of 2)]
[im 1/100]
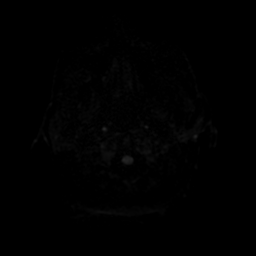
[im 17/100]
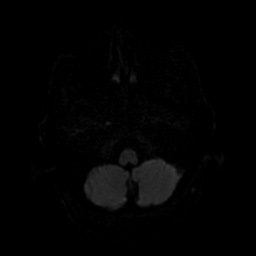
[im 34/100]
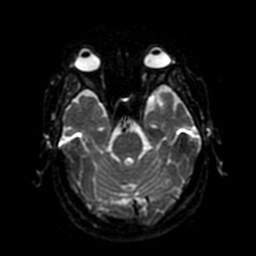
[im 50/100]
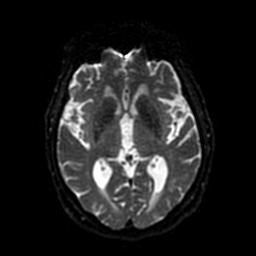
[im 67/100]
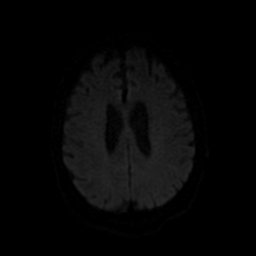
[im 83/100]
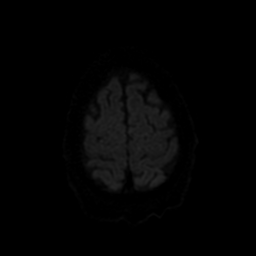
[im 100/100]
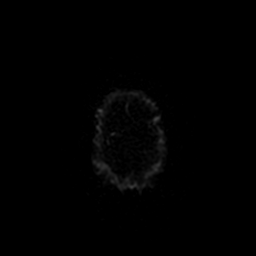

[Series 5: ax (id) 2 · axial · 1.0mm · 0.43mm/px · z∈[-58,-2]mm · 5 of 194 slices shown]
[im 1/194]
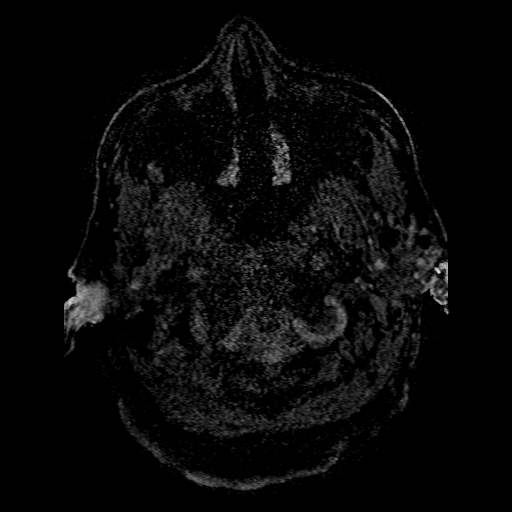
[im 33/194]
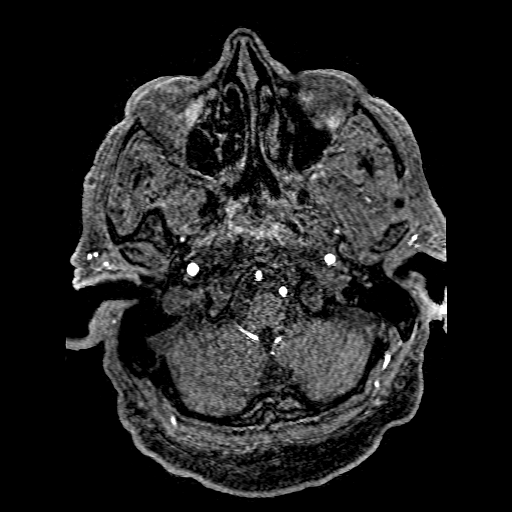
[im 65/194]
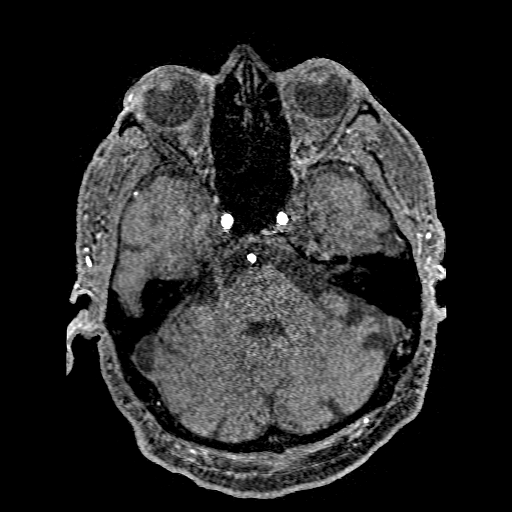
[im 81/194]
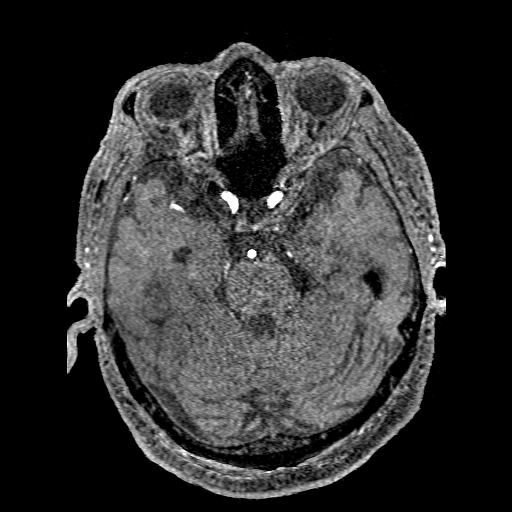
[im 113/194]
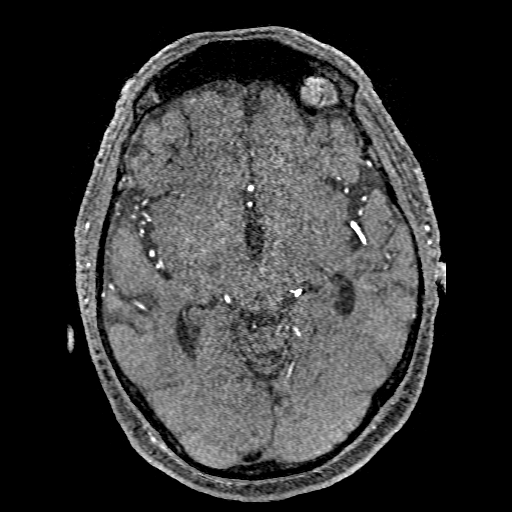

[Series 7: T2 · axial · 5.0mm · 0.47mm/px · z∈[-86,+56]mm · 2 of 25 slices shown (1 of 2)]
[im 1/25]
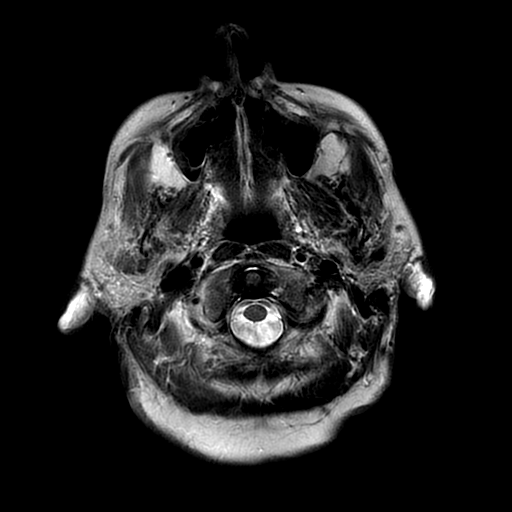
[im 25/25]
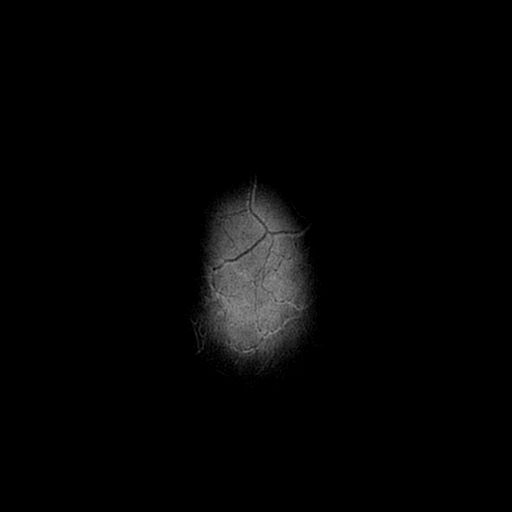

[Series 8: FLAIR · axial · 5.0mm · 0.47mm/px · z∈[-86,+56]mm · 2 of 25 slices shown (2 of 2)]
[im 1/25]
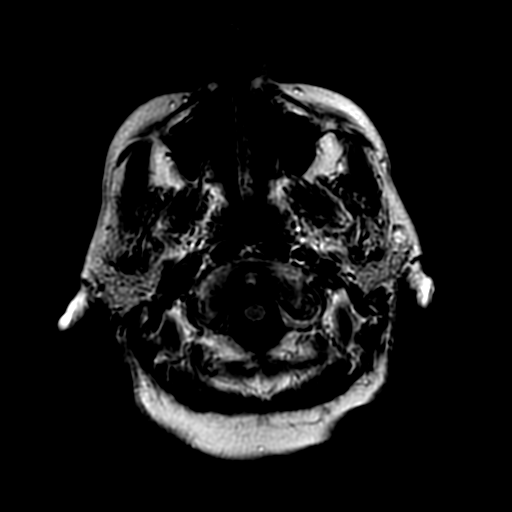
[im 25/25]
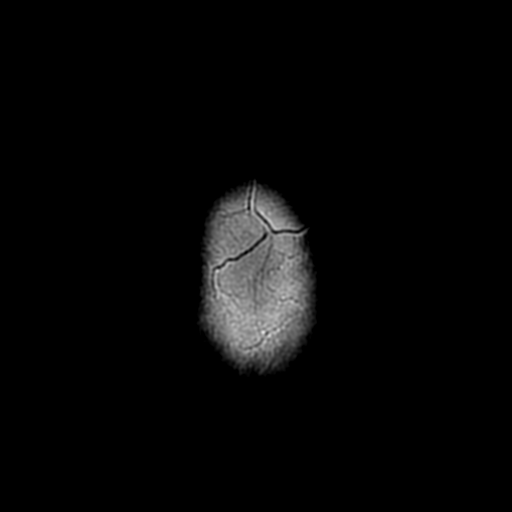

[Series 9: DWI · coronal · 4.0mm · 0.94mm/px · 5 of 72 slices shown (2 of 2)]
[im 1/72]
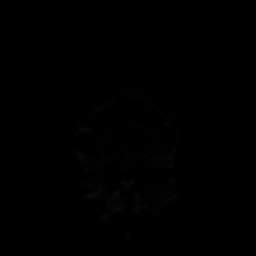
[im 18/72]
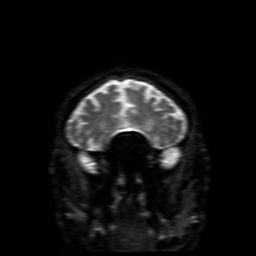
[im 36/72]
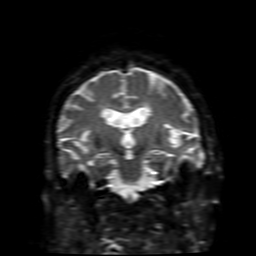
[im 54/72]
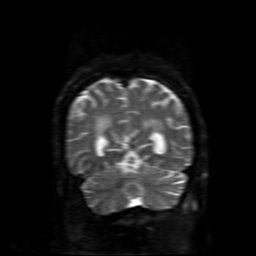
[im 72/72]
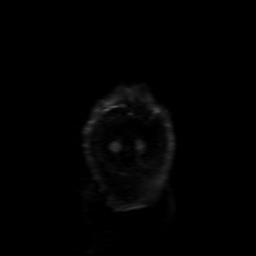

[Series 12: T2 · coronal · 5.0mm · 0.47mm/px · 2 of 30 slices shown (2 of 2)]
[im 1/30]
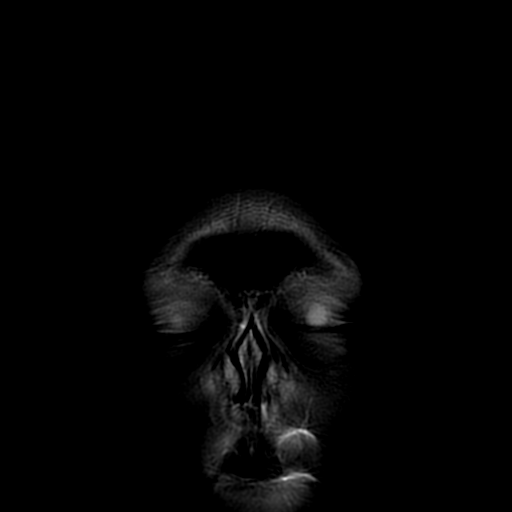
[im 30/30]
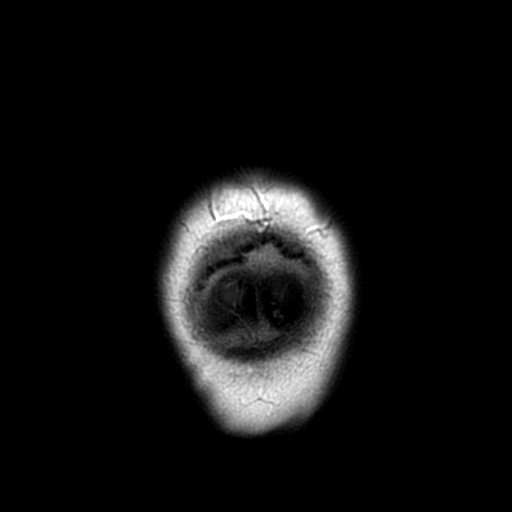

[Series 450: ADC · axial · 3.0mm · 0.94mm/px · z∈[-88,+57]mm · 3 of 50 slices shown (1 of 2)]
[im 1/50]
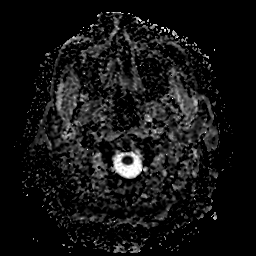
[im 25/50]
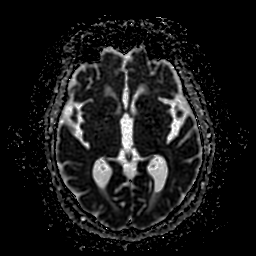
[im 50/50]
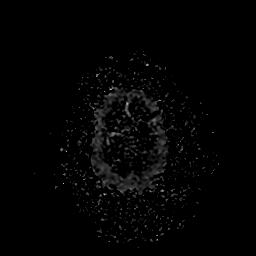

[Series 950: ADC · coronal · 4.0mm · 0.94mm/px · 2 of 36 slices shown (2 of 2)]
[im 1/36]
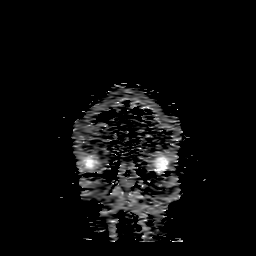
[im 36/36]
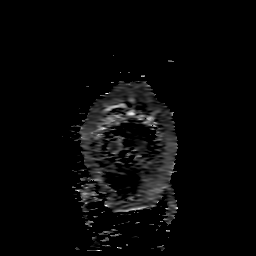

[30 of 48 positions shown; findings below may reference images not displayed]

FINDINGS: MRI HEAD FINDINGS

Brain: The midline structures are normal. There is focal diffusion
restriction within the right caudate tail of and extending
inferiorly to involve the posterior limb of the right internal
capsule. There is no hemorrhage. No mass effect. There are
additional punctate foci of diffusion restriction within the left
parietal lobe and right occipital lobe. There is beginning confluent
hyperintense T2-weighted signal within the periventricular white
matter, most often seen in the setting of chronic microvascular
ischemia. No mass lesion. No chronic microhemorrhage or cerebral
amyloid angiopathy. No hydrocephalus, age advanced atrophy or lobar
predominant volume loss. No dural abnormality or extra-axial
collection.

Skull and upper cervical spine: The visualized skull base,
calvarium, upper cervical spine and extracranial soft tissues are
normal.

Sinuses/Orbits: No fluid levels or advanced mucosal thickening. No
mastoid effusion. Normal orbits.

MRA HEAD FINDINGS

Intracranial internal carotid arteries: Normal.

Anterior cerebral arteries: Normal.

Middle cerebral arteries: Normal.

Posterior communicating arteries: Present bilaterally.

Posterior cerebral arteries: Normal.

Basilar artery: Normal.

Vertebral arteries: Left dominant. Normal.

Superior cerebellar arteries: Normal.

Anterior inferior cerebellar arteries: Not clearly seen, which is
not uncommon.

Posterior inferior cerebellar arteries: Normal.
IMPRESSION: 1. Acute ischemia of the right caudate tail extending to the
posterior limb of the right internal capsule, in keeping with acute
left-sided weakness.
2. Punctate foci of acute ischemia within the left parietal lobe and
at the right temporal occipital junction. The distribution of
lesions suggests a central cardioembolic process.
3. No hemorrhage or mass effect.
4. Chronic microvascular ischemia.
5. Normal intracranial MRA.

## 2017-09-21 DIAGNOSIS — Z8546 Personal history of malignant neoplasm of prostate: Secondary | ICD-10-CM | POA: Diagnosis not present

## 2017-09-21 DIAGNOSIS — M069 Rheumatoid arthritis, unspecified: Secondary | ICD-10-CM | POA: Diagnosis not present

## 2017-09-21 DIAGNOSIS — G629 Polyneuropathy, unspecified: Secondary | ICD-10-CM | POA: Diagnosis not present

## 2017-09-21 DIAGNOSIS — K219 Gastro-esophageal reflux disease without esophagitis: Secondary | ICD-10-CM | POA: Diagnosis not present

## 2017-09-21 DIAGNOSIS — Z125 Encounter for screening for malignant neoplasm of prostate: Secondary | ICD-10-CM | POA: Diagnosis not present

## 2017-09-21 DIAGNOSIS — M810 Age-related osteoporosis without current pathological fracture: Secondary | ICD-10-CM | POA: Diagnosis not present

## 2017-09-21 DIAGNOSIS — Z0000 Encounter for general adult medical examination without abnormal findings: Secondary | ICD-10-CM | POA: Diagnosis not present

## 2017-09-21 DIAGNOSIS — Z23 Encounter for immunization: Secondary | ICD-10-CM | POA: Diagnosis not present

## 2017-09-21 DIAGNOSIS — I129 Hypertensive chronic kidney disease with stage 1 through stage 4 chronic kidney disease, or unspecified chronic kidney disease: Secondary | ICD-10-CM | POA: Diagnosis not present

## 2017-09-21 DIAGNOSIS — D472 Monoclonal gammopathy: Secondary | ICD-10-CM | POA: Diagnosis not present

## 2017-09-21 DIAGNOSIS — I480 Paroxysmal atrial fibrillation: Secondary | ICD-10-CM | POA: Diagnosis not present

## 2017-09-21 DIAGNOSIS — E7800 Pure hypercholesterolemia, unspecified: Secondary | ICD-10-CM | POA: Diagnosis not present

## 2017-10-30 DIAGNOSIS — E663 Overweight: Secondary | ICD-10-CM | POA: Diagnosis not present

## 2017-10-30 DIAGNOSIS — M810 Age-related osteoporosis without current pathological fracture: Secondary | ICD-10-CM | POA: Diagnosis not present

## 2017-10-30 DIAGNOSIS — M0579 Rheumatoid arthritis with rheumatoid factor of multiple sites without organ or systems involvement: Secondary | ICD-10-CM | POA: Diagnosis not present

## 2017-10-30 DIAGNOSIS — R6884 Jaw pain: Secondary | ICD-10-CM | POA: Diagnosis not present

## 2017-10-30 DIAGNOSIS — Z6827 Body mass index (BMI) 27.0-27.9, adult: Secondary | ICD-10-CM | POA: Diagnosis not present

## 2017-10-30 DIAGNOSIS — Z79899 Other long term (current) drug therapy: Secondary | ICD-10-CM | POA: Diagnosis not present

## 2017-10-30 DIAGNOSIS — M2550 Pain in unspecified joint: Secondary | ICD-10-CM | POA: Diagnosis not present

## 2018-01-30 DIAGNOSIS — M810 Age-related osteoporosis without current pathological fracture: Secondary | ICD-10-CM | POA: Diagnosis not present

## 2018-01-30 DIAGNOSIS — E663 Overweight: Secondary | ICD-10-CM | POA: Diagnosis not present

## 2018-01-30 DIAGNOSIS — Z79899 Other long term (current) drug therapy: Secondary | ICD-10-CM | POA: Diagnosis not present

## 2018-01-30 DIAGNOSIS — R6884 Jaw pain: Secondary | ICD-10-CM | POA: Diagnosis not present

## 2018-01-30 DIAGNOSIS — M0579 Rheumatoid arthritis with rheumatoid factor of multiple sites without organ or systems involvement: Secondary | ICD-10-CM | POA: Diagnosis not present

## 2018-01-30 DIAGNOSIS — Z6827 Body mass index (BMI) 27.0-27.9, adult: Secondary | ICD-10-CM | POA: Diagnosis not present

## 2018-01-30 DIAGNOSIS — M2550 Pain in unspecified joint: Secondary | ICD-10-CM | POA: Diagnosis not present

## 2018-03-25 ENCOUNTER — Other Ambulatory Visit (HOSPITAL_COMMUNITY): Payer: Self-pay | Admitting: Geriatric Medicine

## 2018-03-25 ENCOUNTER — Other Ambulatory Visit: Payer: Self-pay | Admitting: Geriatric Medicine

## 2018-03-25 ENCOUNTER — Ambulatory Visit
Admission: RE | Admit: 2018-03-25 | Discharge: 2018-03-25 | Disposition: A | Discharge status: Home / Self Care | Payer: PPO | Source: Ambulatory Visit | Attending: Geriatric Medicine | Admitting: Geriatric Medicine

## 2018-03-25 DIAGNOSIS — R011 Cardiac murmur, unspecified: Secondary | ICD-10-CM

## 2018-03-25 DIAGNOSIS — I129 Hypertensive chronic kidney disease with stage 1 through stage 4 chronic kidney disease, or unspecified chronic kidney disease: Secondary | ICD-10-CM | POA: Diagnosis not present

## 2018-03-25 DIAGNOSIS — Z23 Encounter for immunization: Secondary | ICD-10-CM | POA: Diagnosis not present

## 2018-03-25 DIAGNOSIS — N183 Chronic kidney disease, stage 3 (moderate): Secondary | ICD-10-CM | POA: Diagnosis not present

## 2018-03-25 DIAGNOSIS — R059 Cough, unspecified: Secondary | ICD-10-CM

## 2018-03-25 DIAGNOSIS — Z79899 Other long term (current) drug therapy: Secondary | ICD-10-CM | POA: Diagnosis not present

## 2018-03-25 DIAGNOSIS — R05 Cough: Secondary | ICD-10-CM | POA: Diagnosis not present

## 2018-03-25 DIAGNOSIS — I480 Paroxysmal atrial fibrillation: Secondary | ICD-10-CM | POA: Diagnosis not present

## 2018-04-01 ENCOUNTER — Encounter

## 2018-04-01 ENCOUNTER — Encounter: Payer: Self-pay | Admitting: Diagnostic Neuroimaging

## 2018-04-01 ENCOUNTER — Ambulatory Visit (INDEPENDENT_AMBULATORY_CARE_PROVIDER_SITE_OTHER): Payer: PPO | Admitting: Diagnostic Neuroimaging

## 2018-04-01 VITALS — BP 165/84 | HR 66 | Ht 67.5 in | Wt 186.2 lb

## 2018-04-01 DIAGNOSIS — I639 Cerebral infarction, unspecified: Secondary | ICD-10-CM

## 2018-04-01 DIAGNOSIS — M48062 Spinal stenosis, lumbar region with neurogenic claudication: Secondary | ICD-10-CM | POA: Diagnosis not present

## 2018-04-01 DIAGNOSIS — G609 Hereditary and idiopathic neuropathy, unspecified: Secondary | ICD-10-CM

## 2018-04-01 MED ORDER — GABAPENTIN 300 MG PO CAPS: 300.0000 mg | ORAL_CAPSULE | Freq: Two times a day (BID) | ORAL | 6 refills | Status: DC

## 2018-04-01 NOTE — Patient Instructions (Signed)
-   trial of gabapentin for pain control; 300mg  at bedtime x 1-2 weeks; then increase to twice a day

## 2018-04-01 NOTE — Progress Notes (Signed)
[facility]  PATIENT: [name] DOB: Jan 05, 1941  REFERRING CLINICIAN: H Stoneking HISTORY FROM: patient and wife REASON FOR VISIT: follow up    HISTORICAL  CHIEF COMPLAINT:  Chief Complaint  Patient presents with  . Hx of stroke    rm 6, wife- [name], "follow up on stroke, is it causing htis sweating; neuropathy in feet/legs has gotten worse"    HISTORY OF PRESENT ILLNESS:   UPDATE (04/01/18, VRP): Since last visit, doing about the same. Some excess sweating on head sensations. Symptoms are stable. Severity is mild. No alleviating or aggravating factors. Tolerating meds.    UPDATE 09/27/16: Since last visit, went to hospital in April 2018 for left sided weakness and falling down, dx'd with bilateral strokes (right caudate PLIC, left parietal, right temporal-occipital) likely due to paroxysmal atrial fibrillation. Now on eliquis anticoagulation. Since then doing well. Attending PT and OT. Left side weakness is improved.   UPDATE 06/28/16: Since last visit, doing better. Had back surgery in Jan 2018, and low back and feet are improved. Some numbness continues. Overall doing well.  PRIOR HPI (03/24/17): 77 year old right-handed male here for evaluation of neuropathy. Patient has history of rheumatoid arthritis, osteoarthritis, lumbar spinal stenosis, bilateral knee replacements. Past 8-10 years patient has had onset of numbness, tingling, burning pain in his calves and ankles and feet. Over the past 5 years he has had low back pain radiating into his legs. Both of these symptoms have worsened in the last 2-3 years. 2014 patient had EMG nerve conduction study demonstrating severe peripheral neuropathy as well as lumbar radiculopathy. Patient has MRI of the lumbar spine showing severe spinal stenosis at L4-5. Patient has been treating this conservatively with epidural steroid injections and physical therapy. Patient has 20-30 years of rheumatoid arthritis on multiple  disease modifying therapies. Currently on arava and prednisone.    REVIEW OF SYSTEMS: Full 14 system review of systems performed and negative with exception of: Dizziness numbness joint pain nervous anxious trouble swallowing fatigue.    ALLERGIES: Allergies  Allergen Reactions  . Clindamycin/Lincomycin Hives, Shortness Of Breath and Swelling  . Enbrel [Etanercept] Swelling    SEVERE LEG SWELLING   . Penicillins Hives and Shortness Of Breath    Has patient had a PCN reaction causing immediate rash, facial/tongue/throat swelling, SOB or lightheadedness with hypotension: Yes Has patient had a PCN reaction causing severe rash involving mucus membranes or skin necrosis: Yes Has patient had a PCN reaction that required hospitalization No Has patient had a PCN reaction occurring within the last 10 years: No If all of the above answers are "NO", then may proceed with Cephalosporin use.   . Codeine Hives and Swelling    HOME MEDICATIONS: Outpatient Medications Prior to Visit  Medication Sig Dispense Refill  . apixaban (ELIQUIS STARTER PACK) 5 MG TABS tablet Take 1 tablet (5 mg total) by mouth 2 (two) times daily. (Patient taking differently: Take 5 mg by mouth daily. ) 60 tablet 2  . ferrous sulfate 325 (65 FE) MG tablet Take 1 tablet (325 mg total) by mouth 2 (two) times daily with a meal. 60 tablet 3  . leflunomide (ARAVA) 20 MG tablet Take 20 mg by mouth daily.     Marland Kitchen omeprazole (PRILOSEC) 20 MG capsule Take 1 capsule (20 mg total) by mouth daily. 30 capsule 1  . predniSONE (DELTASONE) 5 MG tablet Take 1 tablet (5 mg total) by mouth daily. 30 tablet 1  . tetrahydrozoline-zinc (VISINE-AC) 0.05-0.25 % ophthalmic  solution Place 2 drops into both eyes 3 (three) times daily as needed (for allergies).    . traMADol (ULTRAM) 50 MG tablet Take 1 tablet (50 mg total) by mouth every 6 (six) hours as needed for moderate pain or severe pain. 20 tablet 0  . amLODipine (NORVASC) 5 MG tablet Take 5 mg  by mouth daily.    Marland Kitchen lisinopril (PRINIVIL,ZESTRIL) 5 MG tablet      No facility-administered medications prior to visit.     PAST MEDICAL HISTORY: Past Medical History:  Diagnosis Date  . Blood transfusion    " no reaction from transfusion"  . BPH (benign prostatic hyperplasia)   . Bruises easily   . Bursitis    chronic hip pain  . Cancer [facility])    prostate cancer cells  . Disc disease, degenerative, lumbar or lumbosacral    Chronic back pain now  . Diverticulitis   . Esophageal ring    requiring dilations   . GERD (gastroesophageal reflux disease)   . History of GI bleed    "vessels burst in colon" x2  . History of kidney stones    chronic  . History of vertigo   . Neuropathy   . Osteoarthritis   . Osteoporosis   . Pneumonia    yrs ago  . Rash    GROIN - ITCHING PAST 3 MONTHS - it is gone now 05/02/16  . Rheumatoid arthritis(714.0)    27 years  . Spondylisthesis   . Stroke ([place]) 07/2016  . Ureteral stone 11/2012    PAST SURGICAL HISTORY: Past Surgical History:  Procedure Laterality Date  . BALLOON DILATION N/A 10/01/2012   Procedure: BALLOON DILATION;  Surgeon: Garlan Fair, MD;  Location: [facility];  Service: Endoscopy;  Laterality: N/A;  . COLECTOMY N/A 06/10/2013   Procedure: TOTAL abdominal COLECTOMY;  Surgeon: Odis Hollingshead, MD;  Location: [facility];  Service: General;  Laterality: N/A;  . COLONOSCOPY    . CYSTOSCOPY WITH URETEROSCOPY Right 12/05/2012   Procedure: CYSTOSCOPY WITH RIGHT URETEROSCOPY AND STONE EXTRACTION;  Surgeon: Malka So, MD;  Location: [facility];  Service: Urology;  Laterality: Right;  . ESOPHAGOGASTRODUODENOSCOPY N/A 10/01/2012   Procedure: ESOPHAGOGASTRODUODENOSCOPY (EGD);  Surgeon: Garlan Fair, MD;  Location: [facility];  Service: Endoscopy;  Laterality: N/A;  . HERNIA REPAIR    . INGUINAL HERNIA REPAIR  2007   Bilateral   . Lithotripsy for nephrolithiasis     Pt and wife not sure when but here in [place]  . Lysis of  Adhesion, small bowel resection  2009  . MAXIMUM ACCESS (MAS)POSTERIOR LUMBAR INTERBODY FUSION (PLIF) 1 LEVEL N/A 05/08/2016   Procedure: LUMBAR FOUR-FIVE FOR MAXIMUM ACCESS (MAS) POSTERIOR LUMBAR INTERBODY FUSION;  Surgeon: Kary Kos, MD;  Location: [facility];  Service: Neurosurgery;  Laterality: N/A;  . NISSEN FUNDOPLICATION     about [id number], pt not sure records not currently available  . TOTAL KNEE ARTHROPLASTY  2003   Bilateral  . TRANSURETHRAL PROSTATECTOMY WITH GYRUS INSTRUMENTS N/A 12/05/2012   Procedure: TRANSURETHRAL PROSTATECTOMY WITH GYRUS INSTRUMENTS;  Surgeon: Malka So, MD;  Location: [facility];  Service: Urology;  Laterality: N/A;  . Ventral Hernia Repair with Mesh      FAMILY HISTORY: Family History  Adopted: Yes  Problem Relation Age of Onset  . Heart failure Mother   . Heart failure Father     SOCIAL HISTORY:  Social History   Socioeconomic History  . Marital status: Married  Spouse name: [name]  . Number of children: 2  . Years of education: 11  . Highest education level: Not on file  Occupational History    Comment: Chief Financial Officer, retired  Scientific laboratory technician  . Financial resource strain: Not on file  . Food insecurity:    Worry: Not on file    Inability: Not on file  . Transportation needs:    Medical: Not on file    Non-medical: Not on file  Tobacco Use  . Smoking status: Never Smoker  . Smokeless tobacco: Never Used  Substance and Sexual Activity  . Alcohol use: No  . Drug use: No  . Sexual activity: Not Currently  Lifestyle  . Physical activity:    Days per week: Not on file    Minutes per session: Not on file  . Stress: Not on file  Relationships  . Social connections:    Talks on phone: Not on file    Gets together: Not on file    Attends religious service: Not on file    Active member of club or organization: Not on file    Attends meetings of clubs or organizations: Not on file    Relationship status: Not on file  . Intimate partner  violence:    Fear of current or ex partner: Not on file    Emotionally abused: Not on file    Physically abused: Not on file    Forced sexual activity: Not on file  Other Topics Concern  . Not on file  Social History Narrative   Lives at home with wife   Caffeine - Cokes, 1-2 weekly; coffee, 2-3 cups daily in winter     PHYSICAL EXAM  GENERAL EXAM/CONSTITUTIONAL: Vitals:  Vitals:   04/01/18 1504  BP: (!) 165/84  Pulse: 66  Weight: 186 lb 3.2 oz (84.5 kg)  Height: 5' 7.5" (1.715 m)   Body mass index is 28.73 kg/m. No exam data present  Patient is in no distress; well developed, nourished and groomed; neck is supple  CARDIOVASCULAR:  Examination of carotid arteries is normal; no carotid bruits  Regular rate and rhythm, no murmurs  Examination of peripheral vascular system by observation and palpation is normal  EYES:  Ophthalmoscopic exam of optic discs and posterior segments is normal; no papilledema or hemorrhages  MUSCULOSKELETAL:  Gait, strength, tone, movements noted in Neurologic exam below  NEUROLOGIC: MENTAL STATUS:  No flowsheet data found.  awake, alert, oriented to person, place and time  recent and remote memory intact  normal attention and concentration  language fluent, comprehension intact, naming intact,   fund of knowledge appropriate  CRANIAL NERVE:   2nd - no papilledema on fundoscopic exam  2nd, 3rd, 4th, 6th - pupils equal and reactive to light, visual fields full to confrontation, extraocular muscles intact, no nystagmus  5th - facial sensation symmetric  7th - facial strength symmetric  8th - hearing intact  9th - palate elevates symmetrically, uvula midline  11th - shoulder shrug symmetric  12th - tongue protrusion midline  MOTOR:   POSTURAL TREMOR IN BUE  normal bulk and tone, full strength in the BUE, BLE  SENSORY:   normal and symmetric to light touch; DECR VIB AT TOES; ABSENT AT ANKLES  COORDINATION:    finger-nose-finger, fine finger movements normal  REFLEXES:   deep tendon reflexes --> ABSENT THROUGHOUT  GAIT/STATION:   narrow based gait    DIAGNOSTIC DATA (LABS, IMAGING, TESTING) - I reviewed patient records, labs, notes,  testing and imaging myself where available.  Lab Results  Component Value Date   WBC 7.6 05/23/2017   HGB 12.2 (L) 05/23/2017   HCT 37.9 (L) 05/23/2017   MCV 80.1 05/23/2017   PLT 287 05/23/2017      Component Value Date/Time   NA 142 05/23/2017 0825   K 4.0 05/23/2017 0825   CL 108 05/23/2017 0825   CO2 27 05/23/2017 0825   GLUCOSE 92 05/23/2017 0825   BUN 15 05/23/2017 0825   CREATININE 1.27 05/23/2017 0825   CALCIUM 9.0 05/23/2017 0825   PROT 7.2 05/23/2017 0825   PROT 7.4 03/24/2016 1226   ALBUMIN 3.3 (L) 05/23/2017 0825   AST 14 05/23/2017 0825   ALT 7 05/23/2017 0825   ALKPHOS 71 05/23/2017 0825   BILITOT 0.5 05/23/2017 0825   GFRNONAA 53 (L) 05/23/2017 0825   GFRAA >60 05/23/2017 0825   Lab Results  Component Value Date   CHOL 140 08/10/2016   HDL 48 08/10/2016   LDLCALC 70 08/10/2016   TRIG 109 08/10/2016   CHOLHDL 2.9 08/10/2016   Lab Results  Component Value Date   HGBA1C 5.8 (H) 08/10/2016   Lab Results  Component Value Date   VITAMINB12 455 03/24/2016   Lab Results  Component Value Date   TSH 0.764 03/24/2016    08/16/12 EMG/NCS (Dr. Brien Few) - Significant peripheral sensorimotor polyneuropathy - Mild lumbar radiculitis  08/26/15 MRI LUMBAR [I reviewed images myself and agree with interpretation. -VRP]  1. Chronic grade 1 L4-L5 spondylolisthesis with progression since 2012. Progressed and severe chronic disc degeneration. Acute and chronic endplate degeneration. Moderate to severe facet and posterior element degeneration. Subsequent L4-L5 multifactorial severe spinal, lateral recess, and right foraminal stenosis. 2. Chronic but less pronounced L5-S1 disc, endplate, and facet degeneration resulting in mild lateral  recess and left foraminal stenosis. 3. Minimal to mild lumbar spine degeneration elsewhere.  08/09/16 CT head  1. 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 2. ASPECTS is 10.  08/10/16 CTA head / neck 1. No emergent intracranial large vessel occlusion. 2. 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 3. Otherwise, no hemodynamically significant stenosis of the major cervical arteries.  08/10/16 MRI / MRA brain [I reviewed images myself and agree with interpretation. -VRP]  1. Acute ischemia of the right caudate tail extending to the posterior limb of the right internal capsule, in keeping with acute left-sided weakness. 2. Punctate foci of acute ischemia within the left parietal lobe and at the right temporal occipital junction. The distribution of lesions suggests a central cardioembolic process. 3. No hemorrhage or mass effect. 4. Chronic microvascular ischemia. 5. Normal intracranial MRA.  08/11/16 TTE - Left ventricle: The cavity size was normal. Wall thickness was   normal. Systolic function was normal. The estimated ejection   fraction was in the range of 60% to 65%. Wall motion was normal;   there were no regional wall motion abnormalities. Doppler   parameters are consistent with abnormal left ventricular   relaxation (grade 1 diastolic dysfunction). - Aortic valve: There was mild regurgitation. - Mitral valve: There was mild regurgitation. - Left atrium: The atrium was mildly to moderately dilated. - Right atrium: The atrium was mildly dilated.  08/10/16 BLE u/s - No evidence of deep vein or superficial thrombosis involving the right lower extremity and left lower extremity.      ASSESSMENT AND PLAN  77 y.o. year old male here with bilateral lower extremity numbness, pain, sensitivity,  with low back pain radiating to the bilateral lower extremities. Patient has signs and symptoms of lumbar spinal  stenosis with neurogenic claudication which has improved with decompression surgery on 05/08/16. He also may have superimposed polyneuropathy, which may be due to underlying rheumatoid arthritis, immunomodulatory medication and less likely MGUS.   Now with cardioembolic strokes due to pAfib in April 2018, now on eliquis.    Ddx: lumbar spinal stenosis + peripheral neuropathy (hereditary vs acquired)  1. Hereditary and idiopathic peripheral neuropathy   2. Spinal stenosis, lumbar region, with neurogenic claudication   3. Cardioembolic stroke (HCC)      PLAN:  CARDIOEMBOLIC STROKES / ATRIAL FIBRILLATION - continue eliquis - continue BP control - continue crestor  HYPERTENSION - follow up BP with PCP  LUMBAR SPINAL STENOSIS (established problem, stable) - continue conservative treatment of lumbar spine disease (Dr. Brien Few, Dr. 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 Alen Blew  Meds ordered this encounter  Medications  . gabapentin (NEURONTIN) 300 MG capsule    Sig: Take 1 capsule (300 mg total) by mouth 2 (two) times daily.    Dispense:  60 capsule    Refill:  6   Return in about 6 months (around 10/01/2018).    Penni Bombard, MD 11/94/1740, 8:14 PM Certified in Neurology, Neurophysiology and Neuroimaging  [facility] [address] [place], [place] [address] [phone]

## 2018-04-05 ENCOUNTER — Ambulatory Visit (HOSPITAL_COMMUNITY): Payer: PPO | Attending: Internal Medicine

## 2018-04-05 ENCOUNTER — Other Ambulatory Visit: Payer: Self-pay

## 2018-04-05 DIAGNOSIS — R011 Cardiac murmur, unspecified: Secondary | ICD-10-CM | POA: Diagnosis not present

## 2018-06-03 ENCOUNTER — Telehealth: Payer: Self-pay | Admitting: Oncology

## 2018-06-03 NOTE — Telephone Encounter (Signed)
Called patient per voicemail scheduling log to reschedule appt, patient did not answer and was not able to leave a voicemail.

## 2018-06-12 ENCOUNTER — Inpatient Hospital Stay: Payer: PPO | Attending: Hematology

## 2018-06-19 ENCOUNTER — Inpatient Hospital Stay: Payer: Self-pay | Attending: Oncology | Admitting: Oncology

## 2018-06-21 ENCOUNTER — Telehealth: Payer: Self-pay | Admitting: Oncology

## 2018-06-21 NOTE — Telephone Encounter (Signed)
Scheduled appt per 3/6 sch message- left message and sent reminder letter in the mail.

## 2018-06-22 DIAGNOSIS — J209 Acute bronchitis, unspecified: Secondary | ICD-10-CM | POA: Diagnosis not present

## 2018-06-22 DIAGNOSIS — J019 Acute sinusitis, unspecified: Secondary | ICD-10-CM | POA: Diagnosis not present

## 2018-07-02 DIAGNOSIS — Z79899 Other long term (current) drug therapy: Secondary | ICD-10-CM | POA: Diagnosis not present

## 2018-07-02 DIAGNOSIS — M0579 Rheumatoid arthritis with rheumatoid factor of multiple sites without organ or systems involvement: Secondary | ICD-10-CM | POA: Diagnosis not present

## 2018-07-02 DIAGNOSIS — Z6826 Body mass index (BMI) 26.0-26.9, adult: Secondary | ICD-10-CM | POA: Diagnosis not present

## 2018-07-02 DIAGNOSIS — E663 Overweight: Secondary | ICD-10-CM | POA: Diagnosis not present

## 2018-07-02 DIAGNOSIS — M2550 Pain in unspecified joint: Secondary | ICD-10-CM | POA: Diagnosis not present

## 2018-07-05 ENCOUNTER — Telehealth: Payer: Self-pay | Admitting: Oncology

## 2018-07-05 NOTE — Telephone Encounter (Signed)
Called patient per 3/16 VM scheduling log.  Cancelled appt per patient request, patient stated he did not need to come back.

## 2018-07-26 ENCOUNTER — Ambulatory Visit: Payer: Self-pay | Admitting: Oncology

## 2018-07-26 ENCOUNTER — Other Ambulatory Visit: Payer: Self-pay

## 2018-08-06 DIAGNOSIS — R05 Cough: Secondary | ICD-10-CM | POA: Diagnosis not present

## 2018-08-06 DIAGNOSIS — N183 Chronic kidney disease, stage 3 (moderate): Secondary | ICD-10-CM | POA: Diagnosis not present

## 2018-08-06 DIAGNOSIS — M069 Rheumatoid arthritis, unspecified: Secondary | ICD-10-CM | POA: Diagnosis not present

## 2018-08-06 DIAGNOSIS — I480 Paroxysmal atrial fibrillation: Secondary | ICD-10-CM | POA: Diagnosis not present

## 2018-08-06 DIAGNOSIS — I129 Hypertensive chronic kidney disease with stage 1 through stage 4 chronic kidney disease, or unspecified chronic kidney disease: Secondary | ICD-10-CM | POA: Diagnosis not present

## 2018-08-29 ENCOUNTER — Telehealth: Payer: Self-pay | Admitting: *Deleted

## 2018-08-29 NOTE — Telephone Encounter (Signed)
Home number stated invalid. Called mobile, LVM advising due to current COVID 19 pandemic, our office is severely reducing in person visits in order to minimize the risk to our patients and healthcare providers. We recommend to convert your appointment to a video visit. Also advised we can move appt to next week. Requested he call back to discuss.

## 2018-10-03 NOTE — Telephone Encounter (Addendum)
Called patient and informed him that due to current COVID 19 pandemic, our office is severely reducing in person visits in order to minimize the risk to our patients and healthcare providers. We recommend to convert your appointment to a video visit. He stated he had called once before stating he felt he no longer needed to come. He stated it's been over two years since his stroke. I asked about his neuropathy; Dr Leta Baptist prescribes gabapentin for him. He stated he couldn't take it;  "It made me feel sick and weak, and in a fog". He stated he would have to learn to live with it. I advised him there are other medications which may give  him some relief. He again stated he didn't feel the need to come in. I advised he discuss neuropathy treatment with PCP and call us for any questions or needs. I advised we will be glad to see him in the future if needed. He  verbalized understanding, appreciation. Gabapentin added to allergy list as intolerant medication.

## 2018-10-08 ENCOUNTER — Ambulatory Visit: Payer: PPO | Admitting: Diagnostic Neuroimaging

## 2018-10-09 DIAGNOSIS — M0579 Rheumatoid arthritis with rheumatoid factor of multiple sites without organ or systems involvement: Secondary | ICD-10-CM | POA: Diagnosis not present

## 2018-10-11 DIAGNOSIS — M810 Age-related osteoporosis without current pathological fracture: Secondary | ICD-10-CM | POA: Diagnosis not present

## 2018-10-11 DIAGNOSIS — D472 Monoclonal gammopathy: Secondary | ICD-10-CM | POA: Diagnosis not present

## 2018-10-11 DIAGNOSIS — I480 Paroxysmal atrial fibrillation: Secondary | ICD-10-CM | POA: Diagnosis not present

## 2018-10-11 DIAGNOSIS — Z0000 Encounter for general adult medical examination without abnormal findings: Secondary | ICD-10-CM | POA: Diagnosis not present

## 2018-10-11 DIAGNOSIS — D500 Iron deficiency anemia secondary to blood loss (chronic): Secondary | ICD-10-CM | POA: Diagnosis not present

## 2018-10-11 DIAGNOSIS — N183 Chronic kidney disease, stage 3 (moderate): Secondary | ICD-10-CM | POA: Diagnosis not present

## 2018-10-11 DIAGNOSIS — E7800 Pure hypercholesterolemia, unspecified: Secondary | ICD-10-CM | POA: Diagnosis not present

## 2018-10-11 DIAGNOSIS — G603 Idiopathic progressive neuropathy: Secondary | ICD-10-CM | POA: Diagnosis not present

## 2018-10-11 DIAGNOSIS — I129 Hypertensive chronic kidney disease with stage 1 through stage 4 chronic kidney disease, or unspecified chronic kidney disease: Secondary | ICD-10-CM | POA: Diagnosis not present

## 2018-10-11 DIAGNOSIS — M069 Rheumatoid arthritis, unspecified: Secondary | ICD-10-CM | POA: Diagnosis not present

## 2018-11-01 DIAGNOSIS — Z7901 Long term (current) use of anticoagulants: Secondary | ICD-10-CM | POA: Diagnosis not present

## 2018-11-01 DIAGNOSIS — Z880 Allergy status to penicillin: Secondary | ICD-10-CM | POA: Diagnosis not present

## 2018-11-01 DIAGNOSIS — R51 Headache: Secondary | ICD-10-CM | POA: Diagnosis not present

## 2018-11-01 DIAGNOSIS — I10 Essential (primary) hypertension: Secondary | ICD-10-CM | POA: Diagnosis not present

## 2018-11-01 DIAGNOSIS — Z8673 Personal history of transient ischemic attack (TIA), and cerebral infarction without residual deficits: Secondary | ICD-10-CM | POA: Diagnosis not present

## 2018-11-01 DIAGNOSIS — Z885 Allergy status to narcotic agent status: Secondary | ICD-10-CM | POA: Diagnosis not present

## 2019-01-01 DIAGNOSIS — R69 Illness, unspecified: Secondary | ICD-10-CM | POA: Diagnosis not present

## 2019-01-02 DIAGNOSIS — M2550 Pain in unspecified joint: Secondary | ICD-10-CM | POA: Diagnosis not present

## 2019-01-02 DIAGNOSIS — Z6827 Body mass index (BMI) 27.0-27.9, adult: Secondary | ICD-10-CM | POA: Diagnosis not present

## 2019-01-02 DIAGNOSIS — M0579 Rheumatoid arthritis with rheumatoid factor of multiple sites without organ or systems involvement: Secondary | ICD-10-CM | POA: Diagnosis not present

## 2019-01-02 DIAGNOSIS — E663 Overweight: Secondary | ICD-10-CM | POA: Diagnosis not present

## 2019-01-02 DIAGNOSIS — Z79899 Other long term (current) drug therapy: Secondary | ICD-10-CM | POA: Diagnosis not present

## 2019-01-15 DIAGNOSIS — I129 Hypertensive chronic kidney disease with stage 1 through stage 4 chronic kidney disease, or unspecified chronic kidney disease: Secondary | ICD-10-CM | POA: Diagnosis not present

## 2019-01-15 DIAGNOSIS — N183 Chronic kidney disease, stage 3 (moderate): Secondary | ICD-10-CM | POA: Diagnosis not present

## 2019-04-03 DIAGNOSIS — M0579 Rheumatoid arthritis with rheumatoid factor of multiple sites without organ or systems involvement: Secondary | ICD-10-CM | POA: Diagnosis not present

## 2019-04-22 IMAGING — DX DG CHEST 2V
2 series · 2 of 2 positions shown · non-contrast
Comparison: Radiographs May 14, 2015.

CLINICAL DATA: Cough.

EXAM:
CHEST - 2 VIEW

[dg chest 2 view (1 of 2)]
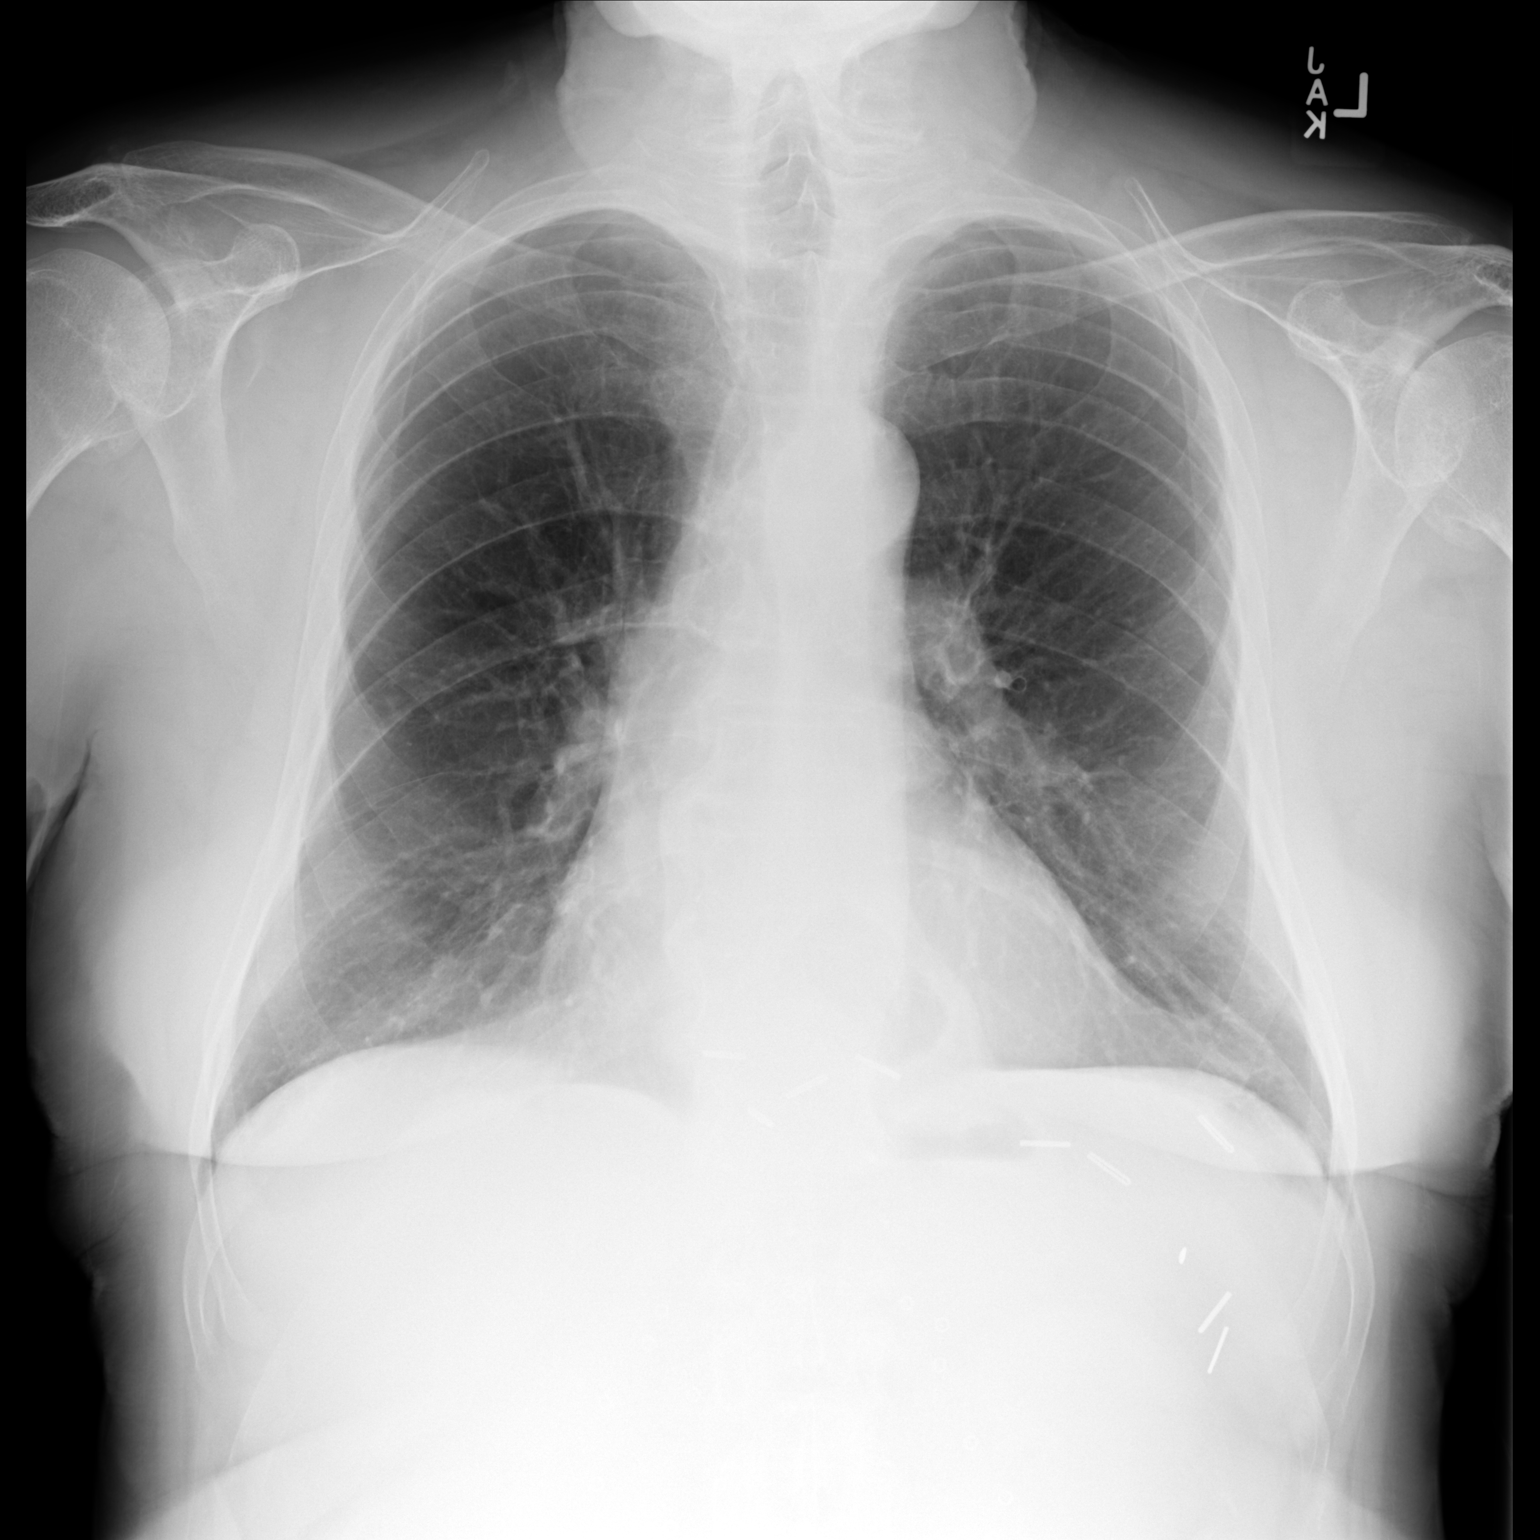

[dg chest 2 view (2 of 2)]
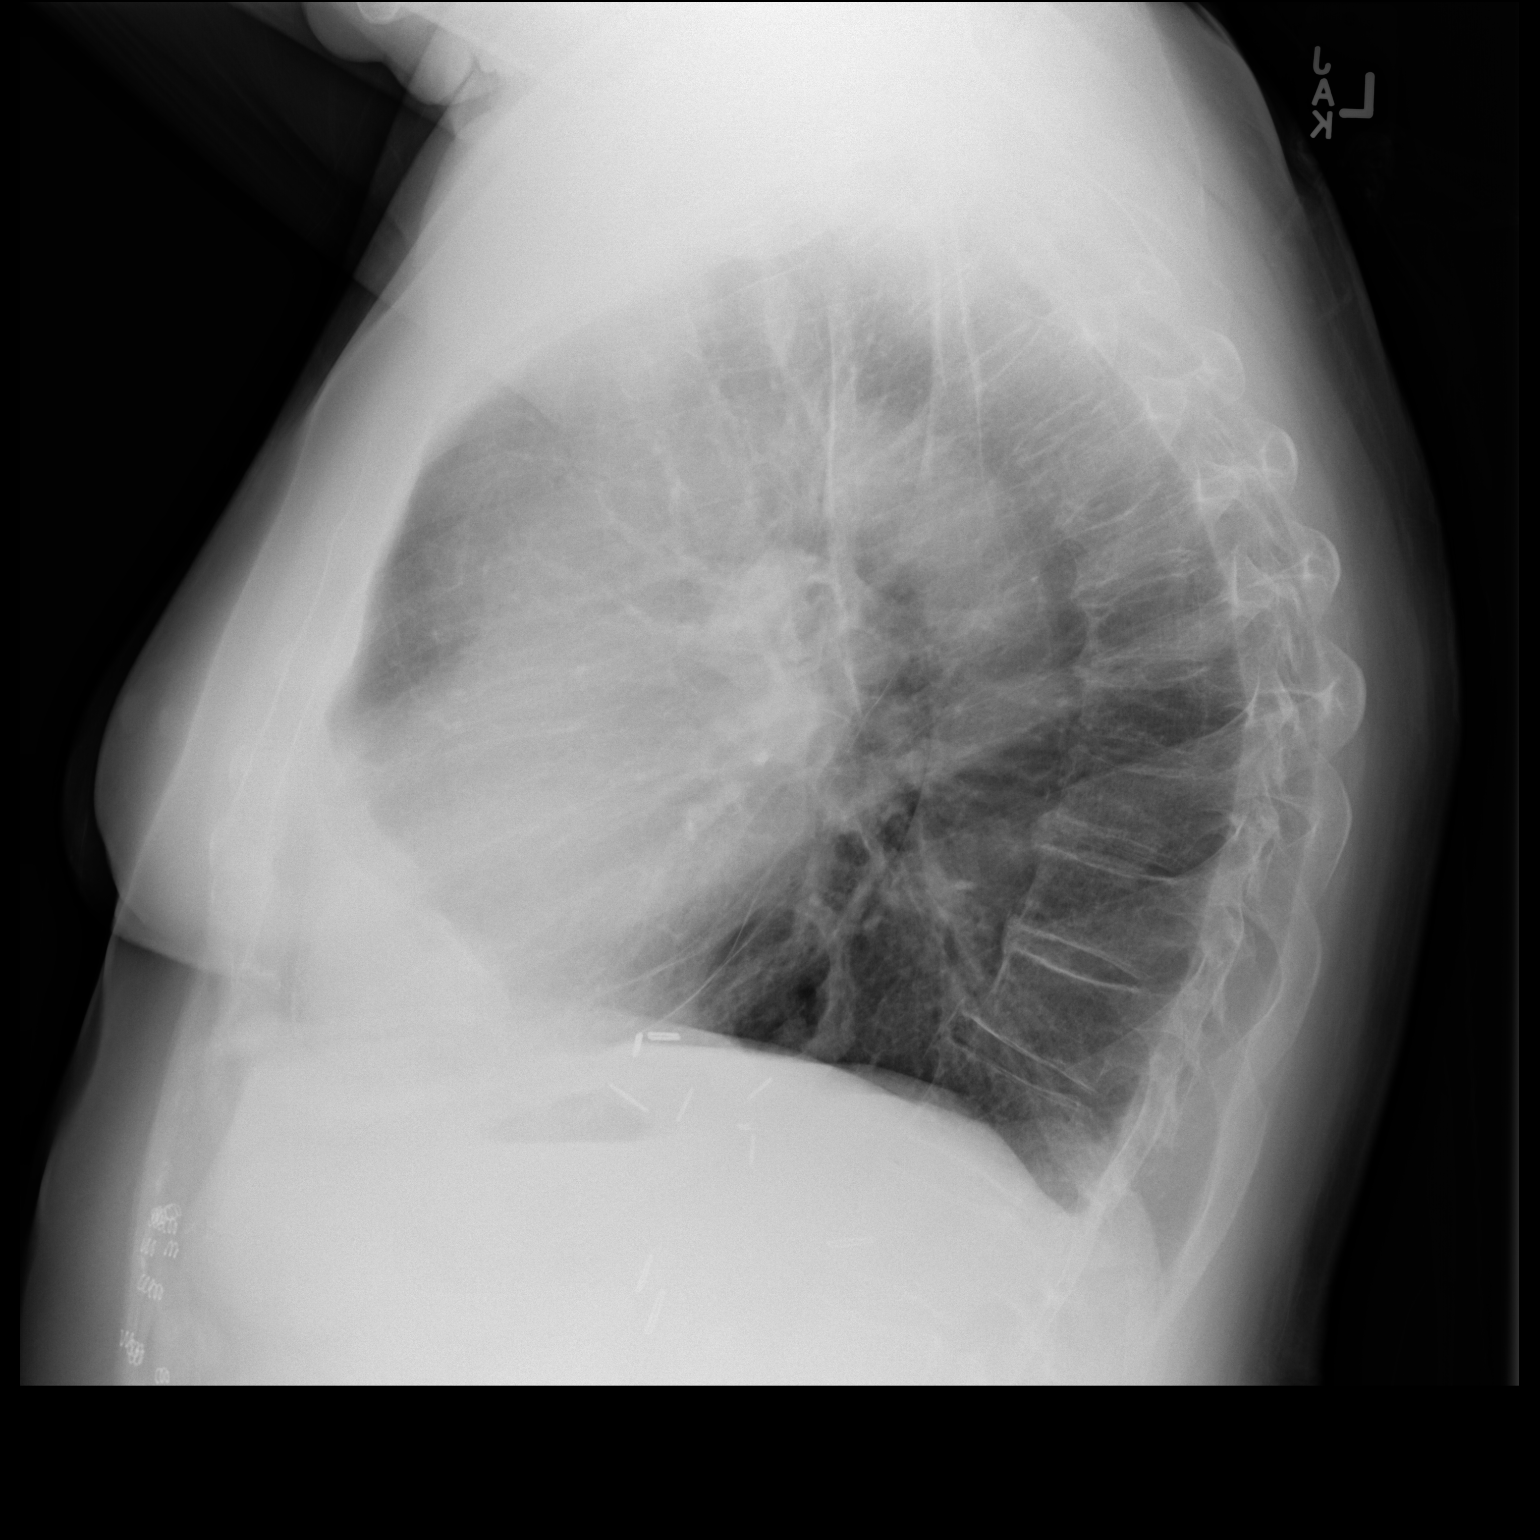

[2 of 2 positions shown; findings below may reference images not displayed]

FINDINGS: The heart size and mediastinal contours are within normal limits. No
pneumothorax or pleural effusion is noted. Small hiatal hernia is
noted. Both lungs are clear. The visualized skeletal structures are
unremarkable.
IMPRESSION: No active cardiopulmonary disease.  Small hiatal hernia.

## 2019-07-03 DIAGNOSIS — M2550 Pain in unspecified joint: Secondary | ICD-10-CM | POA: Diagnosis not present

## 2019-07-03 DIAGNOSIS — E663 Overweight: Secondary | ICD-10-CM | POA: Diagnosis not present

## 2019-07-03 DIAGNOSIS — Z6827 Body mass index (BMI) 27.0-27.9, adult: Secondary | ICD-10-CM | POA: Diagnosis not present

## 2019-07-03 DIAGNOSIS — Z79899 Other long term (current) drug therapy: Secondary | ICD-10-CM | POA: Diagnosis not present

## 2019-07-03 DIAGNOSIS — M0579 Rheumatoid arthritis with rheumatoid factor of multiple sites without organ or systems involvement: Secondary | ICD-10-CM | POA: Diagnosis not present

## 2019-07-15 DIAGNOSIS — I480 Paroxysmal atrial fibrillation: Secondary | ICD-10-CM | POA: Diagnosis not present

## 2019-07-15 DIAGNOSIS — D472 Monoclonal gammopathy: Secondary | ICD-10-CM | POA: Diagnosis not present

## 2019-07-15 DIAGNOSIS — G603 Idiopathic progressive neuropathy: Secondary | ICD-10-CM | POA: Diagnosis not present

## 2019-07-15 DIAGNOSIS — I129 Hypertensive chronic kidney disease with stage 1 through stage 4 chronic kidney disease, or unspecified chronic kidney disease: Secondary | ICD-10-CM | POA: Diagnosis not present

## 2019-07-15 DIAGNOSIS — M069 Rheumatoid arthritis, unspecified: Secondary | ICD-10-CM | POA: Diagnosis not present

## 2019-07-15 DIAGNOSIS — N1831 Chronic kidney disease, stage 3a: Secondary | ICD-10-CM | POA: Diagnosis not present

## 2019-07-15 DIAGNOSIS — D6869 Other thrombophilia: Secondary | ICD-10-CM | POA: Diagnosis not present

## 2019-10-10 DIAGNOSIS — M0579 Rheumatoid arthritis with rheumatoid factor of multiple sites without organ or systems involvement: Secondary | ICD-10-CM | POA: Diagnosis not present

## 2019-11-11 DIAGNOSIS — G603 Idiopathic progressive neuropathy: Secondary | ICD-10-CM | POA: Diagnosis not present

## 2019-11-11 DIAGNOSIS — E7800 Pure hypercholesterolemia, unspecified: Secondary | ICD-10-CM | POA: Diagnosis not present

## 2019-11-11 DIAGNOSIS — Z0000 Encounter for general adult medical examination without abnormal findings: Secondary | ICD-10-CM | POA: Diagnosis not present

## 2019-11-11 DIAGNOSIS — D6869 Other thrombophilia: Secondary | ICD-10-CM | POA: Diagnosis not present

## 2019-11-11 DIAGNOSIS — Z1389 Encounter for screening for other disorder: Secondary | ICD-10-CM | POA: Diagnosis not present

## 2019-11-11 DIAGNOSIS — I129 Hypertensive chronic kidney disease with stage 1 through stage 4 chronic kidney disease, or unspecified chronic kidney disease: Secondary | ICD-10-CM | POA: Diagnosis not present

## 2019-11-11 DIAGNOSIS — I480 Paroxysmal atrial fibrillation: Secondary | ICD-10-CM | POA: Diagnosis not present

## 2019-11-11 DIAGNOSIS — D472 Monoclonal gammopathy: Secondary | ICD-10-CM | POA: Diagnosis not present

## 2019-11-11 DIAGNOSIS — D500 Iron deficiency anemia secondary to blood loss (chronic): Secondary | ICD-10-CM | POA: Diagnosis not present

## 2019-11-11 DIAGNOSIS — N1831 Chronic kidney disease, stage 3a: Secondary | ICD-10-CM | POA: Diagnosis not present

## 2019-11-11 DIAGNOSIS — M069 Rheumatoid arthritis, unspecified: Secondary | ICD-10-CM | POA: Diagnosis not present

## 2019-12-02 DIAGNOSIS — Z79899 Other long term (current) drug therapy: Secondary | ICD-10-CM | POA: Diagnosis not present

## 2019-12-02 DIAGNOSIS — D649 Anemia, unspecified: Secondary | ICD-10-CM | POA: Diagnosis not present

## 2019-12-05 DIAGNOSIS — L821 Other seborrheic keratosis: Secondary | ICD-10-CM | POA: Diagnosis not present

## 2019-12-05 DIAGNOSIS — L570 Actinic keratosis: Secondary | ICD-10-CM | POA: Diagnosis not present

## 2019-12-05 DIAGNOSIS — L820 Inflamed seborrheic keratosis: Secondary | ICD-10-CM | POA: Diagnosis not present

## 2019-12-05 DIAGNOSIS — L720 Epidermal cyst: Secondary | ICD-10-CM | POA: Diagnosis not present

## 2019-12-05 DIAGNOSIS — D225 Melanocytic nevi of trunk: Secondary | ICD-10-CM | POA: Diagnosis not present

## 2019-12-05 DIAGNOSIS — C44212 Basal cell carcinoma of skin of right ear and external auricular canal: Secondary | ICD-10-CM | POA: Diagnosis not present

## 2019-12-31 DIAGNOSIS — C44212 Basal cell carcinoma of skin of right ear and external auricular canal: Secondary | ICD-10-CM | POA: Diagnosis not present

## 2019-12-31 DIAGNOSIS — Z85828 Personal history of other malignant neoplasm of skin: Secondary | ICD-10-CM | POA: Diagnosis not present

## 2020-01-02 DIAGNOSIS — I480 Paroxysmal atrial fibrillation: Secondary | ICD-10-CM | POA: Diagnosis not present

## 2020-01-02 DIAGNOSIS — N1831 Chronic kidney disease, stage 3a: Secondary | ICD-10-CM | POA: Diagnosis not present

## 2020-01-02 DIAGNOSIS — I129 Hypertensive chronic kidney disease with stage 1 through stage 4 chronic kidney disease, or unspecified chronic kidney disease: Secondary | ICD-10-CM | POA: Diagnosis not present

## 2020-01-02 DIAGNOSIS — M069 Rheumatoid arthritis, unspecified: Secondary | ICD-10-CM | POA: Diagnosis not present

## 2020-01-02 DIAGNOSIS — Z8546 Personal history of malignant neoplasm of prostate: Secondary | ICD-10-CM | POA: Diagnosis not present

## 2020-01-02 DIAGNOSIS — D500 Iron deficiency anemia secondary to blood loss (chronic): Secondary | ICD-10-CM | POA: Diagnosis not present

## 2020-01-02 DIAGNOSIS — E7800 Pure hypercholesterolemia, unspecified: Secondary | ICD-10-CM | POA: Diagnosis not present

## 2020-01-02 DIAGNOSIS — M810 Age-related osteoporosis without current pathological fracture: Secondary | ICD-10-CM | POA: Diagnosis not present

## 2020-01-06 DIAGNOSIS — Z79899 Other long term (current) drug therapy: Secondary | ICD-10-CM | POA: Diagnosis not present

## 2020-01-06 DIAGNOSIS — E663 Overweight: Secondary | ICD-10-CM | POA: Diagnosis not present

## 2020-01-06 DIAGNOSIS — Z6826 Body mass index (BMI) 26.0-26.9, adult: Secondary | ICD-10-CM | POA: Diagnosis not present

## 2020-01-06 DIAGNOSIS — M2550 Pain in unspecified joint: Secondary | ICD-10-CM | POA: Diagnosis not present

## 2020-01-06 DIAGNOSIS — M0579 Rheumatoid arthritis with rheumatoid factor of multiple sites without organ or systems involvement: Secondary | ICD-10-CM | POA: Diagnosis not present

## 2020-01-22 DIAGNOSIS — I129 Hypertensive chronic kidney disease with stage 1 through stage 4 chronic kidney disease, or unspecified chronic kidney disease: Secondary | ICD-10-CM | POA: Diagnosis not present

## 2020-01-22 DIAGNOSIS — M069 Rheumatoid arthritis, unspecified: Secondary | ICD-10-CM | POA: Diagnosis not present

## 2020-01-22 DIAGNOSIS — E7800 Pure hypercholesterolemia, unspecified: Secondary | ICD-10-CM | POA: Diagnosis not present

## 2020-01-22 DIAGNOSIS — M810 Age-related osteoporosis without current pathological fracture: Secondary | ICD-10-CM | POA: Diagnosis not present

## 2020-01-22 DIAGNOSIS — I480 Paroxysmal atrial fibrillation: Secondary | ICD-10-CM | POA: Diagnosis not present

## 2020-01-22 DIAGNOSIS — D500 Iron deficiency anemia secondary to blood loss (chronic): Secondary | ICD-10-CM | POA: Diagnosis not present

## 2020-01-22 DIAGNOSIS — Z8546 Personal history of malignant neoplasm of prostate: Secondary | ICD-10-CM | POA: Diagnosis not present

## 2020-01-22 DIAGNOSIS — N1831 Chronic kidney disease, stage 3a: Secondary | ICD-10-CM | POA: Diagnosis not present

## 2020-02-15 DIAGNOSIS — D500 Iron deficiency anemia secondary to blood loss (chronic): Secondary | ICD-10-CM | POA: Diagnosis not present

## 2020-02-15 DIAGNOSIS — Z8546 Personal history of malignant neoplasm of prostate: Secondary | ICD-10-CM | POA: Diagnosis not present

## 2020-02-15 DIAGNOSIS — I480 Paroxysmal atrial fibrillation: Secondary | ICD-10-CM | POA: Diagnosis not present

## 2020-02-15 DIAGNOSIS — M069 Rheumatoid arthritis, unspecified: Secondary | ICD-10-CM | POA: Diagnosis not present

## 2020-02-15 DIAGNOSIS — M810 Age-related osteoporosis without current pathological fracture: Secondary | ICD-10-CM | POA: Diagnosis not present

## 2020-02-15 DIAGNOSIS — N1831 Chronic kidney disease, stage 3a: Secondary | ICD-10-CM | POA: Diagnosis not present

## 2020-02-15 DIAGNOSIS — E7800 Pure hypercholesterolemia, unspecified: Secondary | ICD-10-CM | POA: Diagnosis not present

## 2020-02-15 DIAGNOSIS — I129 Hypertensive chronic kidney disease with stage 1 through stage 4 chronic kidney disease, or unspecified chronic kidney disease: Secondary | ICD-10-CM | POA: Diagnosis not present

## 2020-05-19 DIAGNOSIS — I129 Hypertensive chronic kidney disease with stage 1 through stage 4 chronic kidney disease, or unspecified chronic kidney disease: Secondary | ICD-10-CM | POA: Diagnosis not present

## 2020-05-19 DIAGNOSIS — K219 Gastro-esophageal reflux disease without esophagitis: Secondary | ICD-10-CM | POA: Diagnosis not present

## 2020-05-19 DIAGNOSIS — N1831 Chronic kidney disease, stage 3a: Secondary | ICD-10-CM | POA: Diagnosis not present

## 2020-05-19 DIAGNOSIS — M069 Rheumatoid arthritis, unspecified: Secondary | ICD-10-CM | POA: Diagnosis not present

## 2020-05-19 DIAGNOSIS — I480 Paroxysmal atrial fibrillation: Secondary | ICD-10-CM | POA: Diagnosis not present

## 2020-05-19 DIAGNOSIS — D500 Iron deficiency anemia secondary to blood loss (chronic): Secondary | ICD-10-CM | POA: Diagnosis not present

## 2020-05-19 DIAGNOSIS — E7800 Pure hypercholesterolemia, unspecified: Secondary | ICD-10-CM | POA: Diagnosis not present

## 2020-05-19 DIAGNOSIS — M810 Age-related osteoporosis without current pathological fracture: Secondary | ICD-10-CM | POA: Diagnosis not present

## 2020-07-07 DIAGNOSIS — M0579 Rheumatoid arthritis with rheumatoid factor of multiple sites without organ or systems involvement: Secondary | ICD-10-CM | POA: Diagnosis not present

## 2020-07-07 DIAGNOSIS — E663 Overweight: Secondary | ICD-10-CM | POA: Diagnosis not present

## 2020-07-07 DIAGNOSIS — Z79899 Other long term (current) drug therapy: Secondary | ICD-10-CM | POA: Diagnosis not present

## 2020-07-07 DIAGNOSIS — M79671 Pain in right foot: Secondary | ICD-10-CM | POA: Diagnosis not present

## 2020-07-07 DIAGNOSIS — M25422 Effusion, left elbow: Secondary | ICD-10-CM | POA: Diagnosis not present

## 2020-07-07 DIAGNOSIS — M2550 Pain in unspecified joint: Secondary | ICD-10-CM | POA: Diagnosis not present

## 2020-07-07 DIAGNOSIS — Z6826 Body mass index (BMI) 26.0-26.9, adult: Secondary | ICD-10-CM | POA: Diagnosis not present

## 2020-07-23 DIAGNOSIS — M25572 Pain in left ankle and joints of left foot: Secondary | ICD-10-CM | POA: Diagnosis not present

## 2020-07-23 DIAGNOSIS — M79672 Pain in left foot: Secondary | ICD-10-CM | POA: Diagnosis not present

## 2020-07-23 DIAGNOSIS — M25571 Pain in right ankle and joints of right foot: Secondary | ICD-10-CM | POA: Diagnosis not present

## 2020-07-23 DIAGNOSIS — M79671 Pain in right foot: Secondary | ICD-10-CM | POA: Diagnosis not present

## 2020-07-24 DIAGNOSIS — E7800 Pure hypercholesterolemia, unspecified: Secondary | ICD-10-CM | POA: Diagnosis not present

## 2020-07-24 DIAGNOSIS — M069 Rheumatoid arthritis, unspecified: Secondary | ICD-10-CM | POA: Diagnosis not present

## 2020-07-24 DIAGNOSIS — D500 Iron deficiency anemia secondary to blood loss (chronic): Secondary | ICD-10-CM | POA: Diagnosis not present

## 2020-07-24 DIAGNOSIS — K219 Gastro-esophageal reflux disease without esophagitis: Secondary | ICD-10-CM | POA: Diagnosis not present

## 2020-07-24 DIAGNOSIS — I480 Paroxysmal atrial fibrillation: Secondary | ICD-10-CM | POA: Diagnosis not present

## 2020-07-24 DIAGNOSIS — N1831 Chronic kidney disease, stage 3a: Secondary | ICD-10-CM | POA: Diagnosis not present

## 2020-07-24 DIAGNOSIS — Z8546 Personal history of malignant neoplasm of prostate: Secondary | ICD-10-CM | POA: Diagnosis not present

## 2020-07-24 DIAGNOSIS — I129 Hypertensive chronic kidney disease with stage 1 through stage 4 chronic kidney disease, or unspecified chronic kidney disease: Secondary | ICD-10-CM | POA: Diagnosis not present

## 2020-07-24 DIAGNOSIS — M810 Age-related osteoporosis without current pathological fracture: Secondary | ICD-10-CM | POA: Diagnosis not present

## 2020-07-26 DIAGNOSIS — M67962 Unspecified disorder of synovium and tendon, left lower leg: Secondary | ICD-10-CM | POA: Diagnosis not present

## 2020-07-26 DIAGNOSIS — R262 Difficulty in walking, not elsewhere classified: Secondary | ICD-10-CM | POA: Diagnosis not present

## 2020-07-26 DIAGNOSIS — R531 Weakness: Secondary | ICD-10-CM | POA: Diagnosis not present

## 2020-07-26 DIAGNOSIS — M79671 Pain in right foot: Secondary | ICD-10-CM | POA: Diagnosis not present

## 2020-07-26 DIAGNOSIS — M79672 Pain in left foot: Secondary | ICD-10-CM | POA: Diagnosis not present

## 2020-07-26 DIAGNOSIS — M67961 Unspecified disorder of synovium and tendon, right lower leg: Secondary | ICD-10-CM | POA: Diagnosis not present

## 2020-07-29 DIAGNOSIS — R531 Weakness: Secondary | ICD-10-CM | POA: Diagnosis not present

## 2020-07-29 DIAGNOSIS — M67962 Unspecified disorder of synovium and tendon, left lower leg: Secondary | ICD-10-CM | POA: Diagnosis not present

## 2020-07-29 DIAGNOSIS — M67961 Unspecified disorder of synovium and tendon, right lower leg: Secondary | ICD-10-CM | POA: Diagnosis not present

## 2020-07-29 DIAGNOSIS — M79672 Pain in left foot: Secondary | ICD-10-CM | POA: Diagnosis not present

## 2020-07-29 DIAGNOSIS — R262 Difficulty in walking, not elsewhere classified: Secondary | ICD-10-CM | POA: Diagnosis not present

## 2020-07-29 DIAGNOSIS — M79671 Pain in right foot: Secondary | ICD-10-CM | POA: Diagnosis not present

## 2020-08-02 DIAGNOSIS — M67962 Unspecified disorder of synovium and tendon, left lower leg: Secondary | ICD-10-CM | POA: Diagnosis not present

## 2020-08-02 DIAGNOSIS — M79671 Pain in right foot: Secondary | ICD-10-CM | POA: Diagnosis not present

## 2020-08-02 DIAGNOSIS — M79672 Pain in left foot: Secondary | ICD-10-CM | POA: Diagnosis not present

## 2020-08-02 DIAGNOSIS — R262 Difficulty in walking, not elsewhere classified: Secondary | ICD-10-CM | POA: Diagnosis not present

## 2020-08-02 DIAGNOSIS — R531 Weakness: Secondary | ICD-10-CM | POA: Diagnosis not present

## 2020-08-02 DIAGNOSIS — M67961 Unspecified disorder of synovium and tendon, right lower leg: Secondary | ICD-10-CM | POA: Diagnosis not present

## 2020-08-05 DIAGNOSIS — R531 Weakness: Secondary | ICD-10-CM | POA: Diagnosis not present

## 2020-08-05 DIAGNOSIS — R262 Difficulty in walking, not elsewhere classified: Secondary | ICD-10-CM | POA: Diagnosis not present

## 2020-08-05 DIAGNOSIS — M79671 Pain in right foot: Secondary | ICD-10-CM | POA: Diagnosis not present

## 2020-08-05 DIAGNOSIS — M67962 Unspecified disorder of synovium and tendon, left lower leg: Secondary | ICD-10-CM | POA: Diagnosis not present

## 2020-08-05 DIAGNOSIS — M67961 Unspecified disorder of synovium and tendon, right lower leg: Secondary | ICD-10-CM | POA: Diagnosis not present

## 2020-08-05 DIAGNOSIS — M79672 Pain in left foot: Secondary | ICD-10-CM | POA: Diagnosis not present

## 2020-08-09 DIAGNOSIS — R531 Weakness: Secondary | ICD-10-CM | POA: Diagnosis not present

## 2020-08-09 DIAGNOSIS — M67961 Unspecified disorder of synovium and tendon, right lower leg: Secondary | ICD-10-CM | POA: Diagnosis not present

## 2020-08-09 DIAGNOSIS — M67962 Unspecified disorder of synovium and tendon, left lower leg: Secondary | ICD-10-CM | POA: Diagnosis not present

## 2020-08-09 DIAGNOSIS — R262 Difficulty in walking, not elsewhere classified: Secondary | ICD-10-CM | POA: Diagnosis not present

## 2020-08-09 DIAGNOSIS — M79671 Pain in right foot: Secondary | ICD-10-CM | POA: Diagnosis not present

## 2020-08-09 DIAGNOSIS — M79672 Pain in left foot: Secondary | ICD-10-CM | POA: Diagnosis not present

## 2020-08-12 DIAGNOSIS — M67962 Unspecified disorder of synovium and tendon, left lower leg: Secondary | ICD-10-CM | POA: Diagnosis not present

## 2020-08-12 DIAGNOSIS — M79671 Pain in right foot: Secondary | ICD-10-CM | POA: Diagnosis not present

## 2020-08-12 DIAGNOSIS — M67961 Unspecified disorder of synovium and tendon, right lower leg: Secondary | ICD-10-CM | POA: Diagnosis not present

## 2020-08-12 DIAGNOSIS — R531 Weakness: Secondary | ICD-10-CM | POA: Diagnosis not present

## 2020-08-12 DIAGNOSIS — M79672 Pain in left foot: Secondary | ICD-10-CM | POA: Diagnosis not present

## 2020-08-12 DIAGNOSIS — R262 Difficulty in walking, not elsewhere classified: Secondary | ICD-10-CM | POA: Diagnosis not present

## 2020-08-23 DIAGNOSIS — R531 Weakness: Secondary | ICD-10-CM | POA: Diagnosis not present

## 2020-08-23 DIAGNOSIS — M67962 Unspecified disorder of synovium and tendon, left lower leg: Secondary | ICD-10-CM | POA: Diagnosis not present

## 2020-08-23 DIAGNOSIS — M79672 Pain in left foot: Secondary | ICD-10-CM | POA: Diagnosis not present

## 2020-08-23 DIAGNOSIS — M79671 Pain in right foot: Secondary | ICD-10-CM | POA: Diagnosis not present

## 2020-08-23 DIAGNOSIS — M67961 Unspecified disorder of synovium and tendon, right lower leg: Secondary | ICD-10-CM | POA: Diagnosis not present

## 2020-08-23 DIAGNOSIS — R262 Difficulty in walking, not elsewhere classified: Secondary | ICD-10-CM | POA: Diagnosis not present

## 2020-08-30 DIAGNOSIS — M79671 Pain in right foot: Secondary | ICD-10-CM | POA: Diagnosis not present

## 2020-08-30 DIAGNOSIS — M67962 Unspecified disorder of synovium and tendon, left lower leg: Secondary | ICD-10-CM | POA: Diagnosis not present

## 2020-08-30 DIAGNOSIS — R531 Weakness: Secondary | ICD-10-CM | POA: Diagnosis not present

## 2020-08-30 DIAGNOSIS — R262 Difficulty in walking, not elsewhere classified: Secondary | ICD-10-CM | POA: Diagnosis not present

## 2020-08-30 DIAGNOSIS — M79672 Pain in left foot: Secondary | ICD-10-CM | POA: Diagnosis not present

## 2020-08-30 DIAGNOSIS — M67961 Unspecified disorder of synovium and tendon, right lower leg: Secondary | ICD-10-CM | POA: Diagnosis not present

## 2020-09-06 DIAGNOSIS — M67961 Unspecified disorder of synovium and tendon, right lower leg: Secondary | ICD-10-CM | POA: Diagnosis not present

## 2020-09-06 DIAGNOSIS — M79672 Pain in left foot: Secondary | ICD-10-CM | POA: Diagnosis not present

## 2020-09-06 DIAGNOSIS — M67962 Unspecified disorder of synovium and tendon, left lower leg: Secondary | ICD-10-CM | POA: Diagnosis not present

## 2020-09-06 DIAGNOSIS — R531 Weakness: Secondary | ICD-10-CM | POA: Diagnosis not present

## 2020-09-06 DIAGNOSIS — M79671 Pain in right foot: Secondary | ICD-10-CM | POA: Diagnosis not present

## 2020-09-06 DIAGNOSIS — R262 Difficulty in walking, not elsewhere classified: Secondary | ICD-10-CM | POA: Diagnosis not present

## 2020-09-09 DIAGNOSIS — M79672 Pain in left foot: Secondary | ICD-10-CM | POA: Diagnosis not present

## 2020-09-09 DIAGNOSIS — M79671 Pain in right foot: Secondary | ICD-10-CM | POA: Diagnosis not present

## 2020-09-09 DIAGNOSIS — R531 Weakness: Secondary | ICD-10-CM | POA: Diagnosis not present

## 2020-09-09 DIAGNOSIS — R262 Difficulty in walking, not elsewhere classified: Secondary | ICD-10-CM | POA: Diagnosis not present

## 2020-09-09 DIAGNOSIS — M67962 Unspecified disorder of synovium and tendon, left lower leg: Secondary | ICD-10-CM | POA: Diagnosis not present

## 2020-09-09 DIAGNOSIS — M67961 Unspecified disorder of synovium and tendon, right lower leg: Secondary | ICD-10-CM | POA: Diagnosis not present

## 2020-09-14 DIAGNOSIS — R531 Weakness: Secondary | ICD-10-CM | POA: Diagnosis not present

## 2020-09-14 DIAGNOSIS — M67962 Unspecified disorder of synovium and tendon, left lower leg: Secondary | ICD-10-CM | POA: Diagnosis not present

## 2020-09-14 DIAGNOSIS — M67961 Unspecified disorder of synovium and tendon, right lower leg: Secondary | ICD-10-CM | POA: Diagnosis not present

## 2020-09-14 DIAGNOSIS — M79672 Pain in left foot: Secondary | ICD-10-CM | POA: Diagnosis not present

## 2020-09-14 DIAGNOSIS — R262 Difficulty in walking, not elsewhere classified: Secondary | ICD-10-CM | POA: Diagnosis not present

## 2020-09-14 DIAGNOSIS — M79671 Pain in right foot: Secondary | ICD-10-CM | POA: Diagnosis not present

## 2020-09-16 DIAGNOSIS — M67962 Unspecified disorder of synovium and tendon, left lower leg: Secondary | ICD-10-CM | POA: Diagnosis not present

## 2020-09-16 DIAGNOSIS — M79672 Pain in left foot: Secondary | ICD-10-CM | POA: Diagnosis not present

## 2020-09-16 DIAGNOSIS — R531 Weakness: Secondary | ICD-10-CM | POA: Diagnosis not present

## 2020-09-16 DIAGNOSIS — M67961 Unspecified disorder of synovium and tendon, right lower leg: Secondary | ICD-10-CM | POA: Diagnosis not present

## 2020-09-16 DIAGNOSIS — R262 Difficulty in walking, not elsewhere classified: Secondary | ICD-10-CM | POA: Diagnosis not present

## 2020-09-16 DIAGNOSIS — M79671 Pain in right foot: Secondary | ICD-10-CM | POA: Diagnosis not present

## 2020-09-24 DIAGNOSIS — M069 Rheumatoid arthritis, unspecified: Secondary | ICD-10-CM | POA: Diagnosis not present

## 2020-09-24 DIAGNOSIS — M19071 Primary osteoarthritis, right ankle and foot: Secondary | ICD-10-CM | POA: Diagnosis not present

## 2020-09-24 DIAGNOSIS — B351 Tinea unguium: Secondary | ICD-10-CM | POA: Diagnosis not present

## 2020-10-01 DIAGNOSIS — Z8546 Personal history of malignant neoplasm of prostate: Secondary | ICD-10-CM | POA: Diagnosis not present

## 2020-10-01 DIAGNOSIS — E7800 Pure hypercholesterolemia, unspecified: Secondary | ICD-10-CM | POA: Diagnosis not present

## 2020-10-01 DIAGNOSIS — N1831 Chronic kidney disease, stage 3a: Secondary | ICD-10-CM | POA: Diagnosis not present

## 2020-10-01 DIAGNOSIS — M810 Age-related osteoporosis without current pathological fracture: Secondary | ICD-10-CM | POA: Diagnosis not present

## 2020-10-01 DIAGNOSIS — K219 Gastro-esophageal reflux disease without esophagitis: Secondary | ICD-10-CM | POA: Diagnosis not present

## 2020-10-01 DIAGNOSIS — D500 Iron deficiency anemia secondary to blood loss (chronic): Secondary | ICD-10-CM | POA: Diagnosis not present

## 2020-10-01 DIAGNOSIS — I480 Paroxysmal atrial fibrillation: Secondary | ICD-10-CM | POA: Diagnosis not present

## 2020-10-01 DIAGNOSIS — M069 Rheumatoid arthritis, unspecified: Secondary | ICD-10-CM | POA: Diagnosis not present

## 2020-10-01 DIAGNOSIS — I129 Hypertensive chronic kidney disease with stage 1 through stage 4 chronic kidney disease, or unspecified chronic kidney disease: Secondary | ICD-10-CM | POA: Diagnosis not present

## 2020-10-07 DIAGNOSIS — M0579 Rheumatoid arthritis with rheumatoid factor of multiple sites without organ or systems involvement: Secondary | ICD-10-CM | POA: Diagnosis not present

## 2020-10-07 DIAGNOSIS — Z79899 Other long term (current) drug therapy: Secondary | ICD-10-CM | POA: Diagnosis not present

## 2020-11-30 DIAGNOSIS — D500 Iron deficiency anemia secondary to blood loss (chronic): Secondary | ICD-10-CM | POA: Diagnosis not present

## 2020-11-30 DIAGNOSIS — N1831 Chronic kidney disease, stage 3a: Secondary | ICD-10-CM | POA: Diagnosis not present

## 2020-11-30 DIAGNOSIS — D6869 Other thrombophilia: Secondary | ICD-10-CM | POA: Diagnosis not present

## 2020-11-30 DIAGNOSIS — Z1389 Encounter for screening for other disorder: Secondary | ICD-10-CM | POA: Diagnosis not present

## 2020-11-30 DIAGNOSIS — G603 Idiopathic progressive neuropathy: Secondary | ICD-10-CM | POA: Diagnosis not present

## 2020-11-30 DIAGNOSIS — M069 Rheumatoid arthritis, unspecified: Secondary | ICD-10-CM | POA: Diagnosis not present

## 2020-11-30 DIAGNOSIS — D472 Monoclonal gammopathy: Secondary | ICD-10-CM | POA: Diagnosis not present

## 2020-11-30 DIAGNOSIS — I129 Hypertensive chronic kidney disease with stage 1 through stage 4 chronic kidney disease, or unspecified chronic kidney disease: Secondary | ICD-10-CM | POA: Diagnosis not present

## 2020-11-30 DIAGNOSIS — Z0000 Encounter for general adult medical examination without abnormal findings: Secondary | ICD-10-CM | POA: Diagnosis not present

## 2020-11-30 DIAGNOSIS — K9089 Other intestinal malabsorption: Secondary | ICD-10-CM | POA: Diagnosis not present

## 2020-11-30 DIAGNOSIS — K219 Gastro-esophageal reflux disease without esophagitis: Secondary | ICD-10-CM | POA: Diagnosis not present

## 2020-11-30 DIAGNOSIS — I480 Paroxysmal atrial fibrillation: Secondary | ICD-10-CM | POA: Diagnosis not present

## 2020-12-14 DIAGNOSIS — I480 Paroxysmal atrial fibrillation: Secondary | ICD-10-CM | POA: Diagnosis not present

## 2020-12-14 DIAGNOSIS — D500 Iron deficiency anemia secondary to blood loss (chronic): Secondary | ICD-10-CM | POA: Diagnosis not present

## 2020-12-14 DIAGNOSIS — M069 Rheumatoid arthritis, unspecified: Secondary | ICD-10-CM | POA: Diagnosis not present

## 2020-12-14 DIAGNOSIS — M810 Age-related osteoporosis without current pathological fracture: Secondary | ICD-10-CM | POA: Diagnosis not present

## 2020-12-14 DIAGNOSIS — I129 Hypertensive chronic kidney disease with stage 1 through stage 4 chronic kidney disease, or unspecified chronic kidney disease: Secondary | ICD-10-CM | POA: Diagnosis not present

## 2020-12-14 DIAGNOSIS — K219 Gastro-esophageal reflux disease without esophagitis: Secondary | ICD-10-CM | POA: Diagnosis not present

## 2020-12-14 DIAGNOSIS — N1831 Chronic kidney disease, stage 3a: Secondary | ICD-10-CM | POA: Diagnosis not present

## 2021-01-04 DIAGNOSIS — N1831 Chronic kidney disease, stage 3a: Secondary | ICD-10-CM | POA: Diagnosis not present

## 2021-01-04 DIAGNOSIS — I480 Paroxysmal atrial fibrillation: Secondary | ICD-10-CM | POA: Diagnosis not present

## 2021-01-04 DIAGNOSIS — I129 Hypertensive chronic kidney disease with stage 1 through stage 4 chronic kidney disease, or unspecified chronic kidney disease: Secondary | ICD-10-CM | POA: Diagnosis not present

## 2021-01-04 DIAGNOSIS — M069 Rheumatoid arthritis, unspecified: Secondary | ICD-10-CM | POA: Diagnosis not present

## 2021-01-04 DIAGNOSIS — K219 Gastro-esophageal reflux disease without esophagitis: Secondary | ICD-10-CM | POA: Diagnosis not present

## 2021-01-04 DIAGNOSIS — D500 Iron deficiency anemia secondary to blood loss (chronic): Secondary | ICD-10-CM | POA: Diagnosis not present

## 2021-01-04 DIAGNOSIS — M810 Age-related osteoporosis without current pathological fracture: Secondary | ICD-10-CM | POA: Diagnosis not present

## 2021-01-05 DIAGNOSIS — Z79899 Other long term (current) drug therapy: Secondary | ICD-10-CM | POA: Diagnosis not present

## 2021-01-05 DIAGNOSIS — E663 Overweight: Secondary | ICD-10-CM | POA: Diagnosis not present

## 2021-01-05 DIAGNOSIS — Z6826 Body mass index (BMI) 26.0-26.9, adult: Secondary | ICD-10-CM | POA: Diagnosis not present

## 2021-01-05 DIAGNOSIS — M2550 Pain in unspecified joint: Secondary | ICD-10-CM | POA: Diagnosis not present

## 2021-01-05 DIAGNOSIS — M0579 Rheumatoid arthritis with rheumatoid factor of multiple sites without organ or systems involvement: Secondary | ICD-10-CM | POA: Diagnosis not present

## 2021-04-06 DIAGNOSIS — M2550 Pain in unspecified joint: Secondary | ICD-10-CM | POA: Diagnosis not present

## 2021-04-06 DIAGNOSIS — M0579 Rheumatoid arthritis with rheumatoid factor of multiple sites without organ or systems involvement: Secondary | ICD-10-CM | POA: Diagnosis not present

## 2021-04-06 DIAGNOSIS — Z79899 Other long term (current) drug therapy: Secondary | ICD-10-CM | POA: Diagnosis not present

## 2021-04-06 DIAGNOSIS — Z6826 Body mass index (BMI) 26.0-26.9, adult: Secondary | ICD-10-CM | POA: Diagnosis not present

## 2021-04-06 DIAGNOSIS — M159 Polyosteoarthritis, unspecified: Secondary | ICD-10-CM | POA: Diagnosis not present

## 2021-04-06 DIAGNOSIS — E663 Overweight: Secondary | ICD-10-CM | POA: Diagnosis not present

## 2021-06-06 ENCOUNTER — Other Ambulatory Visit: Payer: Self-pay | Admitting: Geriatric Medicine

## 2021-06-06 ENCOUNTER — Other Ambulatory Visit: Payer: Self-pay

## 2021-06-06 ENCOUNTER — Ambulatory Visit
Admission: RE | Admit: 2021-06-06 | Discharge: 2021-06-06 | Disposition: A | Discharge status: Home / Self Care | Payer: Medicare HMO | Source: Ambulatory Visit | Attending: Geriatric Medicine | Admitting: Geriatric Medicine

## 2021-06-06 DIAGNOSIS — N1831 Chronic kidney disease, stage 3a: Secondary | ICD-10-CM | POA: Diagnosis not present

## 2021-06-06 DIAGNOSIS — R0609 Other forms of dyspnea: Secondary | ICD-10-CM

## 2021-06-06 DIAGNOSIS — M069 Rheumatoid arthritis, unspecified: Secondary | ICD-10-CM | POA: Diagnosis not present

## 2021-06-06 DIAGNOSIS — I480 Paroxysmal atrial fibrillation: Secondary | ICD-10-CM | POA: Diagnosis not present

## 2021-06-06 DIAGNOSIS — I129 Hypertensive chronic kidney disease with stage 1 through stage 4 chronic kidney disease, or unspecified chronic kidney disease: Secondary | ICD-10-CM | POA: Diagnosis not present

## 2021-06-06 DIAGNOSIS — G603 Idiopathic progressive neuropathy: Secondary | ICD-10-CM | POA: Diagnosis not present

## 2021-06-06 DIAGNOSIS — D6869 Other thrombophilia: Secondary | ICD-10-CM | POA: Diagnosis not present

## 2021-06-22 DIAGNOSIS — N529 Male erectile dysfunction, unspecified: Secondary | ICD-10-CM | POA: Diagnosis not present

## 2021-06-22 DIAGNOSIS — I4891 Unspecified atrial fibrillation: Secondary | ICD-10-CM | POA: Diagnosis not present

## 2021-06-22 DIAGNOSIS — M0550 Rheumatoid polyneuropathy with rheumatoid arthritis of unspecified site: Secondary | ICD-10-CM | POA: Diagnosis not present

## 2021-06-22 DIAGNOSIS — E663 Overweight: Secondary | ICD-10-CM | POA: Diagnosis not present

## 2021-06-22 DIAGNOSIS — Z7901 Long term (current) use of anticoagulants: Secondary | ICD-10-CM | POA: Diagnosis not present

## 2021-06-22 DIAGNOSIS — I739 Peripheral vascular disease, unspecified: Secondary | ICD-10-CM | POA: Diagnosis not present

## 2021-06-22 DIAGNOSIS — Z7952 Long term (current) use of systemic steroids: Secondary | ICD-10-CM | POA: Diagnosis not present

## 2021-06-22 DIAGNOSIS — I951 Orthostatic hypotension: Secondary | ICD-10-CM | POA: Diagnosis not present

## 2021-06-22 DIAGNOSIS — G8929 Other chronic pain: Secondary | ICD-10-CM | POA: Diagnosis not present

## 2021-06-22 DIAGNOSIS — M1990 Unspecified osteoarthritis, unspecified site: Secondary | ICD-10-CM | POA: Diagnosis not present

## 2021-06-22 DIAGNOSIS — Z008 Encounter for other general examination: Secondary | ICD-10-CM | POA: Diagnosis not present

## 2021-06-22 DIAGNOSIS — Z6826 Body mass index (BMI) 26.0-26.9, adult: Secondary | ICD-10-CM | POA: Diagnosis not present

## 2021-06-22 DIAGNOSIS — I10 Essential (primary) hypertension: Secondary | ICD-10-CM | POA: Diagnosis not present

## 2021-07-05 NOTE — Progress Notes (Signed)
?Cardiology Office Note:   ? ?Date:  07/06/2021  ? ?ID:  [name], DOB 1940/09/08, MRN [id number] ? ?PCP:  Lajean Manes, MD  ? ?[facility] Providers ?Cardiologist:  Lenna Sciara, MD ?Referring MD: Lajean Manes, MD  ? ?Chief Complaint/Reason for Referral: Dyspnea on exertion ? ?ASSESSMENT:   ? ?1. Dyspnea, unspecified type   ?2. Atrial fibrillation, unspecified type ([place])   ?3. PVD (peripheral vascular disease) ([place])   ?4. Hyperlipidemia, unspecified hyperlipidemia type   ?5. Hypertension, unspecified type   ? ? ?PLAN:   ? ?In order of problems listed above: ?1.  I will check a CBC, reflex TSH, and CMP.  We will obtain echocardiogram to evaluate further.  Given the fact he gets short of breath when he speaks and needs to take deep size at times makes me wonder whether he has a neuropathic process affecting diaphragmatic function.  While his chest x-ray recently was not abnormal I will obtain PFTs as I am wondering whether he has a neuropathic process affecting his diaphragmatic function; if this is suggestive, he may need a neurological evaluation. ?2.  His atrial fibrillation rate is well controlled.  I do not think this is associated with his dyspnea and particularly his dyspnea when he speaks. ?3.  Given high-grade vertebral stenosis patient has peripheral vascular disease.  Continue Eliquis in lieu of aspirin and start simvastatin 40 mg daily for LDL goal less than 70.  He had documented muscle pains in response to atorvastatin.  If he cannot tolerate simvastatin I will refer him to pharmacy for further management. ?4.  We will check lipid panel and LFTs at next visit. ?5.  Goal blood pressures less than 130/80 mmHg. ? ? ?  ? ?Dispo:  No follow-ups on file.  ? ?  ? ?Medication Adjustments/Labs and Tests Ordered: ?Current medicines are reviewed at length with the patient today.  Concerns regarding medicines are outlined above.  ? ?Tests Ordered: ?No orders of the defined types were placed in this  encounter. ? ? ?Medication Changes: ?No orders of the defined types were placed in this encounter. ? ? ?History of Present Illness:   ? ?FOCUSED PROBLEM LIST:   ?1.  Paroxysmal atrial fibrillation on anticoagulation ?2.  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  Hypertension ?5.  Peripheral vascular disease with CT angio head demonstrating high-grade vertebral stenosis ?6.  Hyperlipidemia ?7.  Rheumatoid arthritis ? ?The patient is a 81 y.o. male with the indicated medical history here for dyspnea on exertion.  The patient was seen by his primary care provider recently.  Chest x-ray was done which demonstrated no acute abnormalities.  There was discussion about referral to a pulmonologist but the patient was concerned about his heart and requested evaluation by cardiology.  The patient had also reported diaphoresis at times along with his shortness of breath. ? ?The patient is here with his wife.  Interestingly he tells me he gets short of breath after he talks.  He will get short of breath with moderate exertion as well.  He has developed diaphoretic spells as well.  He denies any exertional angina, presyncope, symptomatic palpitations or syncope.  He has had no significant orthopnea or paroxysmal nocturnal dyspnea.  He tells me that about 6 weeks ago he started becoming much more short of breath.  He is unable to do most of his activities of daily living.  He does have  neuropathy which affects his lower extremities.  He does have residual left-sided weakness from his stroke.  He denies any recent bleeding or bruising, or signs or symptoms of stroke. ? ? ?Current Medications: ?Current Meds  ?Medication Sig  ? amLODipine (NORVASC) 10 MG tablet Take 10 mg by mouth daily.  ? amLODipine (NORVASC) 5 MG tablet Take 5 mg by mouth daily.  ? apixaban (ELIQUIS STARTER PACK) 5 MG TABS tablet Take 1 tablet (5 mg total) by  mouth 2 (two) times daily. (Patient taking differently: Take 5 mg by mouth daily.)  ? famotidine (PEPCID) 40 MG tablet Take 40 mg by mouth at bedtime.  ? ferrous sulfate 325 (65 FE) MG tablet as needed.  ? ketoconazole (NIZORAL) 2 % cream as needed.  ? leflunomide (ARAVA) 20 MG tablet Take 20 mg by mouth daily.   ? metoprolol succinate (TOPROL-XL) 25 MG 24 hr tablet Take 25 mg by mouth in the morning.  ? predniSONE (DELTASONE) 5 MG tablet Take 1 tablet (5 mg total) by mouth daily.  ? sildenafil (REVATIO) 20 MG tablet Take 60 mg by mouth daily as needed.  ? tetrahydrozoline-zinc (VISINE-AC) 0.05-0.25 % ophthalmic solution Place 2 drops into both eyes 3 (three) times daily as needed (for allergies).  ? traMADol (ULTRAM) 50 MG tablet Take 1 tablet (50 mg total) by mouth every 6 (six) hours as needed for moderate pain or severe pain.  ?  ? ?Allergies:    ?Clindamycin/lincomycin, Enbrel [etanercept], Penicillins, Codeine, Atorvastatin, Gabapentin, Infliximab, Lisinopril, and Sulfamethoxazole  ? ?Social History:   ?Social History  ? ?Tobacco Use  ? Smoking status: Never  ? Smokeless tobacco: Never  ?Substance Use Topics  ? Alcohol use: No  ? Drug use: No  ?  ? ?Family Hx: ?Family History  ?Adopted: Yes  ?Problem Relation Age of Onset  ? Heart failure Mother   ? Heart failure Father   ?  ? ?Review of Systems:   ?Please see the history of present illness.    ?All other systems reviewed and are negative. ?  ? ? ?EKGs/Labs/Other Test Reviewed:   ? ?EKG: 2018 sinus rhythm with PVCs; today's EKG demonstrates rate controlled atrial fibrillation ? ?Prior CV studies: ? ?TTE 2019 ?- Left ventricle: The cavity size was normal. Wall thickness was  ?  normal. Systolic function was normal. The estimated ejection  ?  fraction was in the range of 60% to 65%. Wall motion was normal;  ?  there were no regional wall motion abnormalities. Features are  ?  consistent with a pseudonormal left ventricular filling pattern,  ?  with concomitant  abnormal relaxation and increased filling  ?  pressure (grade 2 diastolic dysfunction).  ?- Aortic valve: Transvalvular velocity was within the normal range.  ?  There was no stenosis. There was trivial regurgitation.  ?- Aorta: Ascending aortic diameter: 41 mm. This may be upper limit  ?  of normal when indexed to body surface area.  ?- Mitral valve: There was mild to moderate regurgitation.  ?- Left atrium: The atrium was mildly dilated.  ?- Right ventricle: The cavity size was normal. Wall thickness was  ?  normal. Systolic function was normal.  ?- Right atrium: The atrium was mildly dilated. Central venous  ?  pressure (est): 3 mm Hg.  ?- Tricuspid valve: There was trivial regurgitation.  ?- Pulmonary arteries: Systolic pressure was mildly increased. PA  ?  peak pressure: 31 mm Hg (S).  ?- Inferior vena cava: The  vessel was normal in size. The  ?  respirophasic diameter changes were in the normal range (= 50%),  ?  consistent with normal central venous pressure.  ? ?Imaging studies that I have independently reviewed today:  ? ?CTA 2018 ?1. Narrowing emergent intracranial large vessel occlusion. ?2. Severe narrowing of the left vertebral artery origin due to ?atherosclerotic calcification. ?3. Otherwise, no hemodynamically significant stenosis of the major ?cervical arteries. ? ?Recent Labs: ?No results found for requested labs within last 8760 hours.  ? ?Recent Lipid Panel ?Lab Results  ?Component Value Date/Time  ? CHOL 140 08/10/2016 07:37 AM  ? TRIG 109 08/10/2016 07:37 AM  ? HDL 48 08/10/2016 07:37 AM  ? LDLCALC 70 08/10/2016 07:37 AM  ? ? ?Risk Assessment/Calculations:   ? ? ?CHA2DS2-VASc Score = 6  ? This indicates a 9.7% annual risk of stroke. ?The patient's score is based upon: ?CHF History: 0 ?HTN History: 1 ?Diabetes History: 0 ?Stroke History: 2 ?Vascular Disease History: 1 ?Age Score: 2 ?Gender Score: 0 ?  ? ?    ? ?Physical Exam:   ? ?VS:  BP 97/64   Pulse 71   Ht 5' 7.5" (1.715 m)   Wt 167 lb  (75.8 kg)   SpO2 100%   BMI 25.77 kg/m?    ?Wt Readings from Last 3 Encounters:  ?07/06/21 167 lb (75.8 kg)  ?04/01/18 186 lb 3.2 oz (84.5 kg)  ?06/12/17 185 lb 12.8 oz (84.3 kg)  ?  ?GENERAL:  No apparent distre

## 2021-07-06 ENCOUNTER — Other Ambulatory Visit: Payer: Self-pay

## 2021-07-06 ENCOUNTER — Encounter: Payer: Self-pay | Admitting: Internal Medicine

## 2021-07-06 ENCOUNTER — Ambulatory Visit: Payer: Medicare HMO | Admitting: Internal Medicine

## 2021-07-06 VITALS — BP 97/64 | HR 71 | Ht 67.5 in | Wt 167.0 lb

## 2021-07-06 DIAGNOSIS — R0600 Dyspnea, unspecified: Secondary | ICD-10-CM

## 2021-07-06 DIAGNOSIS — I739 Peripheral vascular disease, unspecified: Secondary | ICD-10-CM

## 2021-07-06 DIAGNOSIS — E785 Hyperlipidemia, unspecified: Secondary | ICD-10-CM | POA: Diagnosis not present

## 2021-07-06 DIAGNOSIS — I4891 Unspecified atrial fibrillation: Secondary | ICD-10-CM | POA: Diagnosis not present

## 2021-07-06 DIAGNOSIS — I10 Essential (primary) hypertension: Secondary | ICD-10-CM | POA: Diagnosis not present

## 2021-07-06 MED ORDER — SIMVASTATIN 40 MG PO TABS
40.0000 mg | ORAL_TABLET | Freq: Every day | ORAL | 3 refills | Status: DC
Start: 2021-07-06 — End: 2021-12-12

## 2021-07-06 NOTE — Patient Instructions (Signed)
Medication Instructions:  ?Your physician has recommended you make the following change in your medication:  ?1.) start simvastatin (Zocor) 40 mg - take one tablet daily ? ?*If you need a refill on your cardiac medications before your next appointment, please call your pharmacy* ? ? ?Lab Work: ?Today: cmet, cbc, reflex tsh ? ?When you return for your next office visit we will check more blood work (lipids/liver function) ? ?If you have labs (blood work) drawn today and your tests are completely normal, you will receive your results only by: ?MyChart Message (if you have MyChart) OR ?A paper copy in the mail ?If you have any lab test that is abnormal or we need to change your treatment, we will call you to review the results. ? ? ?Testing/Procedures: ?Your physician has requested that you have an echocardiogram. Echocardiography is a painless test that uses sound waves to create images of your heart. It provides your doctor with information about the size and shape of your heart and how well your heart?s chambers and valves are working. This procedure takes approximately one hour. There are no restrictions for this procedure. ? ?Your physician has recommended that you have a pulmonary function test. Pulmonary Function Tests are a group of tests that measure how well air moves in and out of your lungs. ? ? ?Follow-Up: ?At [facility], you and your health needs are our priority.  As part of our continuing mission to provide you with exceptional heart care, we have created designated Provider Care Teams.  These Care Teams include your primary Cardiologist (physician) and Advanced Practice Providers (APPs -  Physician Assistants and Nurse Practitioners) who all work together to provide you with the care you need, when you need it. ? ? ?Your next appointment:   ?6 month(s) ? ?The format for your next appointment:   ?In Person ? ?Provider:   ?Early Osmond, MD   ? ?  ?

## 2021-07-07 LAB — COMPREHENSIVE METABOLIC PANEL
ALT: 6 IU/L (ref 0–44)
AST: 13 IU/L (ref 0–40)
Albumin/Globulin Ratio: 1.3 (ref 1.2–2.2)
Albumin: 3.7 g/dL (ref 3.7–4.7)
Alkaline Phosphatase: 85 IU/L (ref 44–121)
BUN/Creatinine Ratio: 8 — ABNORMAL LOW (ref 10–24)
BUN: 17 mg/dL (ref 8–27)
Bilirubin Total: 0.2 mg/dL (ref 0.0–1.2)
CO2: 21 mmol/L (ref 20–29)
Calcium: 9.2 mg/dL (ref 8.6–10.2)
Chloride: 102 mmol/L (ref 96–106)
Creatinine, Ser: 2.18 mg/dL — ABNORMAL HIGH (ref 0.76–1.27)
Globulin, Total: 2.9 g/dL (ref 1.5–4.5)
Glucose: 174 mg/dL — ABNORMAL HIGH (ref 70–99)
Potassium: 4.5 mmol/L (ref 3.5–5.2)
Sodium: 142 mmol/L (ref 134–144)
Total Protein: 6.6 g/dL (ref 6.0–8.5)
eGFR: 30 mL/min/{1.73_m2} — ABNORMAL LOW (ref >59)

## 2021-07-07 LAB — CBC
Hematocrit: 30.6 % — ABNORMAL LOW (ref 37.5–51.0)
Hemoglobin: 9.1 g/dL — ABNORMAL LOW (ref 13.0–17.7)
MCH: 21.3 pg — ABNORMAL LOW (ref 26.6–33.0)
MCHC: 29.7 g/dL — ABNORMAL LOW (ref 31.5–35.7)
MCV: 72 fL — ABNORMAL LOW (ref 79–97)
Platelets: 604 10*3/uL — ABNORMAL HIGH (ref 150–450)
RBC: 4.27 x10E6/uL (ref 4.14–5.80)
RDW: 16.4 % — ABNORMAL HIGH (ref 11.6–15.4)
WBC: 12.5 10*3/uL — ABNORMAL HIGH (ref 3.4–10.8)

## 2021-07-07 LAB — TSH RFX ON ABNORMAL TO FREE T4: TSH: 1.81 u[IU]/mL (ref 0.450–4.500)

## 2021-07-08 DIAGNOSIS — M1991 Primary osteoarthritis, unspecified site: Secondary | ICD-10-CM | POA: Diagnosis not present

## 2021-07-08 DIAGNOSIS — R0602 Shortness of breath: Secondary | ICD-10-CM | POA: Diagnosis not present

## 2021-07-08 DIAGNOSIS — M0579 Rheumatoid arthritis with rheumatoid factor of multiple sites without organ or systems involvement: Secondary | ICD-10-CM | POA: Diagnosis not present

## 2021-07-08 DIAGNOSIS — Z6825 Body mass index (BMI) 25.0-25.9, adult: Secondary | ICD-10-CM | POA: Diagnosis not present

## 2021-07-08 DIAGNOSIS — Z79899 Other long term (current) drug therapy: Secondary | ICD-10-CM | POA: Diagnosis not present

## 2021-07-08 DIAGNOSIS — G894 Chronic pain syndrome: Secondary | ICD-10-CM | POA: Diagnosis not present

## 2021-07-08 DIAGNOSIS — E663 Overweight: Secondary | ICD-10-CM | POA: Diagnosis not present

## 2021-07-15 ENCOUNTER — Ambulatory Visit (HOSPITAL_COMMUNITY): Payer: Medicare HMO | Attending: Internal Medicine

## 2021-07-15 DIAGNOSIS — R0600 Dyspnea, unspecified: Secondary | ICD-10-CM | POA: Diagnosis not present

## 2021-07-15 DIAGNOSIS — I10 Essential (primary) hypertension: Secondary | ICD-10-CM | POA: Diagnosis not present

## 2021-07-15 DIAGNOSIS — R0609 Other forms of dyspnea: Secondary | ICD-10-CM | POA: Diagnosis not present

## 2021-07-15 DIAGNOSIS — I739 Peripheral vascular disease, unspecified: Secondary | ICD-10-CM | POA: Diagnosis not present

## 2021-07-15 DIAGNOSIS — E785 Hyperlipidemia, unspecified: Secondary | ICD-10-CM | POA: Diagnosis not present

## 2021-07-15 DIAGNOSIS — I4891 Unspecified atrial fibrillation: Secondary | ICD-10-CM | POA: Diagnosis not present

## 2021-07-15 LAB — ECHOCARDIOGRAM COMPLETE
Area-P 1/2: 2.3 cm2
P 1/2 time: 650 msec
S' Lateral: 2.2 cm

## 2021-08-12 ENCOUNTER — Ambulatory Visit (INDEPENDENT_AMBULATORY_CARE_PROVIDER_SITE_OTHER): Payer: Medicare HMO | Admitting: Internal Medicine

## 2021-08-12 DIAGNOSIS — I10 Essential (primary) hypertension: Secondary | ICD-10-CM

## 2021-08-12 DIAGNOSIS — R0600 Dyspnea, unspecified: Secondary | ICD-10-CM | POA: Diagnosis not present

## 2021-08-12 DIAGNOSIS — I4891 Unspecified atrial fibrillation: Secondary | ICD-10-CM

## 2021-08-12 DIAGNOSIS — E785 Hyperlipidemia, unspecified: Secondary | ICD-10-CM

## 2021-08-12 DIAGNOSIS — I739 Peripheral vascular disease, unspecified: Secondary | ICD-10-CM

## 2021-08-12 LAB — PULMONARY FUNCTION TEST
DL/VA % pred: 113 %
DL/VA: 4.5 ml/min/mmHg/L
DLCO cor % pred: 102 %
DLCO cor: 22.37 ml/min/mmHg
DLCO unc % pred: 81 %
DLCO unc: 17.91 ml/min/mmHg
FEF 25-75 Post: 1.51 L/sec
FEF 25-75 Pre: 1.22 L/sec
FEF2575-%Change-Post: 23 %
FEF2575-%Pred-Post: 91 %
FEF2575-%Pred-Pre: 73 %
FEV1-%Change-Post: 4 %
FEV1-%Pred-Post: 88 %
FEV1-%Pred-Pre: 84 %
FEV1-Post: 2.16 L
FEV1-Pre: 2.07 L
FEV1FVC-%Change-Post: 2 %
FEV1FVC-%Pred-Pre: 98 %
FEV6-%Change-Post: 2 %
FEV6-%Pred-Post: 93 %
FEV6-%Pred-Pre: 90 %
FEV6-Post: 2.99 L
FEV6-Pre: 2.91 L
FEV6FVC-%Change-Post: 0 %
FEV6FVC-%Pred-Post: 107 %
FEV6FVC-%Pred-Pre: 106 %
FVC-%Change-Post: 2 %
FVC-%Pred-Post: 87 %
FVC-%Pred-Pre: 85 %
FVC-Post: 3.01 L
FVC-Pre: 2.95 L
Post FEV1/FVC ratio: 72 %
Post FEV6/FVC ratio: 99 %
Pre FEV1/FVC ratio: 70 %
Pre FEV6/FVC Ratio: 99 %
RV % pred: 110 %
RV: 2.71 L
TLC % pred: 86 %
TLC: 5.48 L

## 2021-08-12 NOTE — Progress Notes (Signed)
PFT done today. 

## 2021-10-07 ENCOUNTER — Encounter: Payer: Self-pay | Admitting: Physical Medicine and Rehabilitation

## 2021-10-10 DIAGNOSIS — E663 Overweight: Secondary | ICD-10-CM | POA: Diagnosis not present

## 2021-10-10 DIAGNOSIS — G894 Chronic pain syndrome: Secondary | ICD-10-CM | POA: Diagnosis not present

## 2021-10-10 DIAGNOSIS — G629 Polyneuropathy, unspecified: Secondary | ICD-10-CM | POA: Diagnosis not present

## 2021-10-10 DIAGNOSIS — M0579 Rheumatoid arthritis with rheumatoid factor of multiple sites without organ or systems involvement: Secondary | ICD-10-CM | POA: Diagnosis not present

## 2021-10-10 DIAGNOSIS — Z79899 Other long term (current) drug therapy: Secondary | ICD-10-CM | POA: Diagnosis not present

## 2021-10-10 DIAGNOSIS — Z6826 Body mass index (BMI) 26.0-26.9, adult: Secondary | ICD-10-CM | POA: Diagnosis not present

## 2021-10-10 DIAGNOSIS — M1991 Primary osteoarthritis, unspecified site: Secondary | ICD-10-CM | POA: Diagnosis not present

## 2021-11-01 DIAGNOSIS — D0439 Carcinoma in situ of skin of other parts of face: Secondary | ICD-10-CM | POA: Diagnosis not present

## 2021-11-01 DIAGNOSIS — L821 Other seborrheic keratosis: Secondary | ICD-10-CM | POA: Diagnosis not present

## 2021-11-01 DIAGNOSIS — L720 Epidermal cyst: Secondary | ICD-10-CM | POA: Diagnosis not present

## 2021-11-01 DIAGNOSIS — L570 Actinic keratosis: Secondary | ICD-10-CM | POA: Diagnosis not present

## 2021-11-01 DIAGNOSIS — Z85828 Personal history of other malignant neoplasm of skin: Secondary | ICD-10-CM | POA: Diagnosis not present

## 2021-11-01 DIAGNOSIS — D485 Neoplasm of uncertain behavior of skin: Secondary | ICD-10-CM | POA: Diagnosis not present

## 2021-12-02 DIAGNOSIS — M810 Age-related osteoporosis without current pathological fracture: Secondary | ICD-10-CM | POA: Diagnosis not present

## 2021-12-02 DIAGNOSIS — K219 Gastro-esophageal reflux disease without esophagitis: Secondary | ICD-10-CM | POA: Diagnosis not present

## 2021-12-02 DIAGNOSIS — D500 Iron deficiency anemia secondary to blood loss (chronic): Secondary | ICD-10-CM | POA: Diagnosis not present

## 2021-12-02 DIAGNOSIS — Z0000 Encounter for general adult medical examination without abnormal findings: Secondary | ICD-10-CM | POA: Diagnosis not present

## 2021-12-02 DIAGNOSIS — Z8546 Personal history of malignant neoplasm of prostate: Secondary | ICD-10-CM | POA: Diagnosis not present

## 2021-12-02 DIAGNOSIS — M069 Rheumatoid arthritis, unspecified: Secondary | ICD-10-CM | POA: Diagnosis not present

## 2021-12-02 DIAGNOSIS — I480 Paroxysmal atrial fibrillation: Secondary | ICD-10-CM | POA: Diagnosis not present

## 2021-12-02 DIAGNOSIS — D6869 Other thrombophilia: Secondary | ICD-10-CM | POA: Diagnosis not present

## 2021-12-02 DIAGNOSIS — N1831 Chronic kidney disease, stage 3a: Secondary | ICD-10-CM | POA: Diagnosis not present

## 2021-12-02 DIAGNOSIS — G603 Idiopathic progressive neuropathy: Secondary | ICD-10-CM | POA: Diagnosis not present

## 2021-12-02 DIAGNOSIS — I129 Hypertensive chronic kidney disease with stage 1 through stage 4 chronic kidney disease, or unspecified chronic kidney disease: Secondary | ICD-10-CM | POA: Diagnosis not present

## 2021-12-02 DIAGNOSIS — D472 Monoclonal gammopathy: Secondary | ICD-10-CM | POA: Diagnosis not present

## 2021-12-12 ENCOUNTER — Encounter: Payer: Medicare HMO | Attending: Physical Medicine and Rehabilitation | Admitting: Physical Medicine and Rehabilitation

## 2021-12-12 ENCOUNTER — Encounter: Payer: Self-pay | Admitting: Physical Medicine and Rehabilitation

## 2021-12-12 VITALS — BP 145/83 | HR 67 | Ht 67.5 in | Wt 172.0 lb

## 2021-12-12 DIAGNOSIS — Z0289 Encounter for other administrative examinations: Secondary | ICD-10-CM | POA: Insufficient documentation

## 2021-12-12 DIAGNOSIS — G894 Chronic pain syndrome: Secondary | ICD-10-CM | POA: Insufficient documentation

## 2021-12-12 DIAGNOSIS — M069 Rheumatoid arthritis, unspecified: Secondary | ICD-10-CM | POA: Insufficient documentation

## 2021-12-12 DIAGNOSIS — G609 Hereditary and idiopathic neuropathy, unspecified: Secondary | ICD-10-CM | POA: Diagnosis not present

## 2021-12-12 DIAGNOSIS — M4316 Spondylolisthesis, lumbar region: Secondary | ICD-10-CM | POA: Diagnosis not present

## 2021-12-12 NOTE — Assessment & Plan Note (Signed)
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 Patient will call 2 weeks after starting medication and, if no side effects, will increase to 60 mg daily.  If not done recently, will also check labs for neuropathy.

## 2021-12-12 NOTE — Assessment & Plan Note (Signed)
Currently stable on low-dose prednisone. Continue following with Rheumatology clinic.   No changes in medications at this time. At 2 month follow up, given primary barrier to reducing tramadol is withdrawal symptoms (anxiety, poor sleep), will plan for slow taper starting with dose reduction to 35 mg TID PRN.

## 2021-12-12 NOTE — Assessment & Plan Note (Signed)
Current neuropathic pain is non-dermatomal, and no flagged exam findings to suggest intervention such as ESI would be beneficial at this time. Additionally, has failed 3x ESI in the past.  Monitor closely for changes and consider repeat imaging and/or EMG if indicated.

## 2021-12-12 NOTE — Assessment & Plan Note (Signed)
Pain contract signed today, UDS pending  If UDS results as expected, will prescribe Tramadol 50 mg BID PRN #60 tabs R1 starting 9/18.  Patient to follow up prior to 10/16, with plan as above to wean medication while up-titrating Duloxetine at that time.

## 2021-12-12 NOTE — Progress Notes (Signed)
Subjective:    Patient ID: [name], male    DOB: 31-Mar-1941, 81 y.o.   MRN: [id number]  HPI [name] is an 81 year old male with a past medical history of MGUS c/b neuropathy, rheumatoid arthritis, spinal stenosis s/p multiple fusions, bilateral knee replacements, diverticulosis status post partial colectomy, prostate cancer, and cardioembolic stroke due to A-fib in 2018 who presents to PM&R clinic to establish as a new patient.  Of note, patient was seen by Dr. Providence Lanius after his hospitalization for stroke in 2018 in the left parietal and temporal lobe, at which time he had residual left-sided weakness, but made an excellent recovery.  CC for visit is pain, requesting controlled substance refill, as Dr. Trudie [facility] will no longer prescribe.    Narcotic/pain Hx:  Pt received a letter in the mail stating his Rheumatology office would stop prescribing narcotics as of February 15, 2022. He has been on medication Tramadol at 50 mg BID PRN for 55 years, and has failed self-tapering medication in the past. (Letter reviewed by Dr. Tressa Busman at visit).   Pt says "I don't hurt unless I do something strenuous; most of the pain is from the neuropathy". Avg pain is 6/10, patient endorses he always takes tramadol BID because "if I don't take it, I feel it. I get withdrawals and I can't sleep." He takes a tyenol PM to help sleep. If he misses the morning Tramadol dose, "I just sit down and can't do nothing"; he states he craves the medication and gets anxious. He's never tried reducing his dose, and only tried coming off of it by himself, and has failed. Only other medications he has tried for pain have been OTC NSAIDs and prednisone. He had 3x ESI in the past with no relief. He went to pain clinic and had 6-8 sessions of PT, underwent inflammatory diet, and had ultrasound and TENs unit for neuropathy, which did not help.    Function: Regarding function, pain (primarily neuropathic) prevents his  from walking long distances beyond parking lot, although if he takes breaks he can get enough relief to play a round of golf. Pain does not disrupt sleep. He does not use assistive device. He is independent of ADLs, not limited by pain, but does get shoulder pain if there's heavy lifting. 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   RA pain: is in his shoulders, hands, knees and back. Improved since retirement.   Neuropathy: overriding neuropathy in his legs, which is more severe than RA pain, and has been present for 10-15 years. They tried to take gabapentin but those gave him hives. He has never been tried on another medication other then gabapentin for neuropathy. He has never had an EMG, and has had no recent imaging of his low back.   Pt last had blood work with [facility] 8/16; labs not available at time of visit. He endorses his kidney numbers are high, but is unsure of exactly what.   Pain Inventory5 Average Pain  Pain Right Now 3 My pain is intermittent and aching  In the last 24 hours, has pain interfered with the following? General activity 3 Relation with others 2 Enjoyment of life 4 What TIME of day is your pain at its worst? daytime Sleep (in general) Good  Pain is worse with: walking and some activites Pain improves with: rest, heat/ice, and medication Relief from Meds: 6  walk without assistance how many minutes can you walk? VARIES ability to climb steps?  yes do you drive?  yes Do you have any goals in this area?  yes  retired  bowel control problems weakness numbness trouble walking  Any changes since last visit?  no  Any changes since last visit?  no    Family History  Adopted: Yes  Problem Relation Age of Onset   Heart failure Mother    Heart failure Father    Social History   Socioeconomic History   Marital status: Married    Spouse name: [name]   Number of  children: 2   Years of education: 12   Highest education level: Not on file  Occupational History    Comment: Chief Financial Officer, retired  Tobacco Use   Smoking status: Never   Smokeless tobacco: Never  Vaping Use   Vaping Use: Never used  Substance and Sexual Activity   Alcohol use: No   Drug use: No   Sexual activity: Not Currently  Other Topics Concern   Not on file  Social History Narrative   Lives at home with wife   Caffeine - Cokes, 1-2 weekly; coffee, 2-3 cups daily in winter   Social Determinants of Health   Financial Resource Strain: Not on file  Food Insecurity: Not on file  Transportation Needs: Not on file  Physical Activity: Not on file  Stress: Not on file  Social Connections: Not on file   Past Surgical History:  Procedure Laterality Date   BALLOON DILATION N/A 10/01/2012   Procedure: BALLOON DILATION;  Surgeon: Garlan Fair, MD;  Location: [facility];  Service: Endoscopy;  Laterality: N/A;   COLECTOMY N/A 06/10/2013   Procedure: TOTAL abdominal COLECTOMY;  Surgeon: Odis Hollingshead, MD;  Location: [facility];  Service: General;  Laterality: N/A;   COLONOSCOPY     CYSTOSCOPY WITH URETEROSCOPY Right 12/05/2012   Procedure: CYSTOSCOPY WITH RIGHT URETEROSCOPY AND STONE EXTRACTION;  Surgeon: Malka So, MD;  Location: [facility];  Service: Urology;  Laterality: Right;   ESOPHAGOGASTRODUODENOSCOPY N/A 10/01/2012   Procedure: ESOPHAGOGASTRODUODENOSCOPY (EGD);  Surgeon: Garlan Fair, MD;  Location: [facility];  Service: Endoscopy;  Laterality: N/A;   HERNIA REPAIR     INGUINAL HERNIA REPAIR  2007   Bilateral    Lithotripsy for nephrolithiasis     Pt and wife not sure when but here in [place]   Lysis of Adhesion, small bowel resection  2009   MAXIMUM ACCESS (MAS)POSTERIOR LUMBAR INTERBODY FUSION (PLIF) 1 LEVEL N/A 05/08/2016   Procedure: LUMBAR FOUR-FIVE FOR MAXIMUM ACCESS (MAS) POSTERIOR LUMBAR INTERBODY FUSION;  Surgeon: Kary Kos, MD;  Location: [place];  Service: Neurosurgery;  Laterality: N/A;   NISSEN FUNDOPLICATION     about 9381, pt not sure records not currently available   TOTAL KNEE ARTHROPLASTY  2003   Bilateral   TRANSURETHRAL PROSTATECTOMY WITH GYRUS INSTRUMENTS N/A 12/05/2012   Procedure: TRANSURETHRAL PROSTATECTOMY WITH GYRUS INSTRUMENTS;  Surgeon: Malka So, MD;  Location: [facility];  Service: Urology;  Laterality: N/A;   Ventral Hernia Repair with Mesh     Past Medical History:  Diagnosis Date   Blood transfusion    " no reaction from transfusion"   BPH (benign prostatic hyperplasia)    Bruises easily    Bursitis    chronic hip pain   Cancer (HCC)    prostate cancer cells   Disc disease, degenerative, lumbar or lumbosacral    Chronic back  pain now   Diverticulitis    Esophageal ring    requiring dilations    GERD (gastroesophageal reflux disease)    History of GI bleed    "vessels burst in colon" x2   History of kidney stones    chronic   History of vertigo    Neuropathy    Osteoarthritis    Osteoporosis    Pneumonia    yrs ago   Rash    GROIN - ITCHING PAST 3 MONTHS - it is gone now 05/02/16   Rheumatoid arthritis(714.0)    27 years   Spondylisthesis    Stroke ([place]) 07/2016   Ureteral stone 11/2012   Ht 5' 7.5" (1.715 m)   Wt 172 lb (78 kg)   BMI 26.54 kg/m   Opioid Risk Score:   Fall Risk Score:  `1  Depression screen [facility] 2/9     12/12/2021    9:14 AM 08/29/2016   10:48 AM  Depression screen PHQ 2/9  Decreased Interest 1 0  Down, Depressed, Hopeless 0 0  PHQ - 2 Score 1 0  Altered sleeping 0 0  Tired, decreased energy 1 2  Change in appetite 0 0  Feeling bad or failure about yourself  0 0  Trouble concentrating 0 0  Moving slowly or fidgety/restless 0 0  Suicidal thoughts 0 0  PHQ-9 Score 2 2  Difficult doing work/chores  Somewhat difficult    Review of Systems  Musculoskeletal:        Pain in both knees & both feet  All other systems reviewed and are negative.       Objective:   Physical Exam  Constitutional: No apparent distress. Appropriate appearance for age.  HENT: No JVD. Neck Supple. Trachea midline. Atraumatic, normocephalic. Eyes: PERRLA. EOMI. Visual fields grossly intact.  Cardiovascular: RRR, no murmurs/rub/gallops. No Edema. Peripheral pulses 2+  Respiratory: CTAB. No rales, rhonchi, or wheezing. On RA.  Abdomen: + bowel sounds, normoactive. No distention or tenderness.  Skin: C/D/I. No apparent lesions. No ulcerations. Heels soft but not boggy.  MSK:      Bilateral collapsed arches in feet without hammer toes.       Negative to pain with forward flexion, backward bending/facet loading, or palpation of entire axial spine, paraspinals, SI joints, greater trochanters.       Strength:                RUE: 5/5 SA, 5/5 EF, 5/5 EE, 5/5 WE, 5/5 FF, 5/5 FA                 LUE: 5/5 SA, 5/5 EF, 5/5 EE, 5/5 WE, 5/5 FF, 5/5 FA                 RLE: 5/5 HF, 5/5 KE, 5/5 DF, 5/5 EHL, 5/5 PF                 LLE:  5/5 HF, 5/5 KE, 5/5 DF, 5/5 EHL, 5/5 PF   Neurologic exam:  Cognition: AAO to person, place, time and event.  Memory: No apparent deficits  Insight: Good insight into current condition.  Mood: Pleasant affect, appropriate mood.  Reflexes: 2+ in BL UE and LEs. Negative Hoffman's and babinski signs bilaterally.  CN: 2-12 grossly intact.  Coordination: No apparent tremors. No ataxia on FTN, HTS bilaterally. Can stand with heels together and eyes open, but cannot maintain with eyes closed. No drift in UE with eyes closed.  Spasticity: MAS 0  in all extremities.  Sensation: Diminished to light touch from mid-thigh to bottoms of feet bilaterally, following no dermatomal distribution and worsening on the bottoms of the feet. Sensitive/painful to touch of the feet and calves.            Assessment & Plan:  Hereditary and idiopathic peripheral neuropathy Assessment & Plan: Non-diabetic peripheral neuropathy effecting bilateral legs, attributed  to MGUS, present for 10-15 years  Failed Gabapentin due to hives  Plan for review of labwork form [facility], if reasonable will start with Duloxetine 30 mg daily. Patient will call 2 weeks after starting medication and, if no side effects, will increase to 60 mg daily.  If not done recently, will also check labs for neuropathy.   Rheumatoid arthritis involving shoulder, unspecified laterality, unspecified whether rheumatoid factor present [facility]) Assessment & Plan: Currently stable on low-dose prednisone. Continue following with Rheumatology clinic.   No changes in medications at this time. At 2 month follow up, given primary barrier to reducing tramadol is withdrawal symptoms (anxiety, poor sleep), will plan for slow taper starting with dose reduction to 35 mg TID PRN.    Chronic pain syndrome  Pain management contract agreement Assessment & Plan: Pain contract signed today, UDS pending  If UDS results as expected, will prescribe Tramadol 50 mg BID PRN #60 tabs R1 starting 9/18.  Patient to follow up prior to 10/16, with plan as above to wean medication while up-titrating Duloxetine at that time.   Orders: -     ToxAssure Select,+Antidepr,UR  Spondylolisthesis at L4-L5 level Assessment & Plan: Current neuropathic pain is non-dermatomal, and no flagged exam findings to suggest intervention such as ESI would be beneficial at this time. Additionally, has failed 3x ESI in the past.  Monitor closely for changes and consider repeat imaging and/or EMG if indicated.     Gertie Gowda, DO 12/12/2021

## 2021-12-12 NOTE — Patient Instructions (Signed)
Today, you signed a contract for narcotic prescription. You should have a current script of Tramadol. If Urine screen results are as expected, starting 01/02/22, you will be prescibed Tramadol 50 mg twice daily as needed #60 tabs monthly, with one refill.  In the interim, please obtain your 8/16 bloodwork from [facility] and send it to [place] for review. The plan is to, if safe, start you on a neuropathic pain medication before your next visit. You will take it for 1-2 weeks, then call clinic to report any side effects.   If you tolerate the neuropathic pain medication, the plan will be to see you in October and reduce your tramadol to 25 mg 3 times daily, and gradually wean the tramadol given your history of withdrawal symptoms.

## 2021-12-16 ENCOUNTER — Telehealth: Payer: Self-pay | Admitting: Physical Medicine and Rehabilitation

## 2021-12-16 ENCOUNTER — Telehealth: Payer: Self-pay | Admitting: *Deleted

## 2021-12-16 DIAGNOSIS — G609 Hereditary and idiopathic neuropathy, unspecified: Secondary | ICD-10-CM

## 2021-12-16 LAB — TOXASSURE SELECT,+ANTIDEPR,UR

## 2021-12-16 MED ORDER — TRAMADOL HCL 50 MG PO TABS: 50.0000 mg | ORAL_TABLET | Freq: Two times a day (BID) | ORAL | 1 refills | Status: DC | PRN

## 2021-12-16 NOTE — Telephone Encounter (Signed)
Called patient at home. Patient answered, discussed UDS results, filling Tramadol medication for 2 months starting 9/18 (sent), and getting bloodwork results from [facility].   [name] Gowda, DO 12/16/2021

## 2021-12-16 NOTE — Telephone Encounter (Signed)
Urine drug screen for this encounter is consistent for prescribed medication 

## 2021-12-27 ENCOUNTER — Telehealth: Payer: Self-pay | Admitting: Physical Medicine and Rehabilitation

## 2021-12-27 DIAGNOSIS — G609 Hereditary and idiopathic neuropathy, unspecified: Secondary | ICD-10-CM

## 2021-12-27 NOTE — Telephone Encounter (Signed)
Called patient regarding bloodwork obtained from [facility]. Unfortunately, did not include labs for neuropathy or renal function.  Talked with patient about obtaining new labs prior to upcoming appointment, as prior [facility] 06/2021 showed Crcl below cutoff for initiating Duloxetine. Per patient preference, will fax these labs to [facility] on [address], as he does not use online portals. Will discuss results at next appointment.  Hereditary and idiopathic peripheral neuropathy -     Comprehensive metabolic panel; Future -     B12 and Folate Panel; Future -     CBC; Future      Gertie Gowda, DO 12/27/2021

## 2021-12-29 ENCOUNTER — Telehealth: Payer: Self-pay

## 2021-12-29 NOTE — Telephone Encounter (Signed)
Dr. Carlyle Lipa office will not be able to perform all labs requested. According to [facility] CMA some of the lab results can be obtained from [name] Hematologist.  [name] has agreed to go to [place] on Monday morning for the lab draws. Patient has been advised to fast..

## 2022-01-02 ENCOUNTER — Other Ambulatory Visit: Payer: Self-pay | Admitting: Physical Medicine and Rehabilitation

## 2022-01-02 DIAGNOSIS — Z7689 Persons encountering health services in other specified circumstances: Secondary | ICD-10-CM | POA: Diagnosis not present

## 2022-01-03 LAB — COMPREHENSIVE METABOLIC PANEL
ALT: 9 IU/L (ref 0–44)
AST: 15 IU/L (ref 0–40)
Albumin/Globulin Ratio: 1.2 (ref 1.2–2.2)
Albumin: 3.6 g/dL — ABNORMAL LOW (ref 3.8–4.8)
Alkaline Phosphatase: 88 IU/L (ref 44–121)
BUN/Creatinine Ratio: 8 — ABNORMAL LOW (ref 10–24)
BUN: 13 mg/dL (ref 8–27)
Bilirubin Total: 0.6 mg/dL (ref 0.0–1.2)
CO2: 23 mmol/L (ref 20–29)
Calcium: 9.3 mg/dL (ref 8.6–10.2)
Chloride: 105 mmol/L (ref 96–106)
Creatinine, Ser: 1.72 mg/dL — ABNORMAL HIGH (ref 0.76–1.27)
Globulin, Total: 3 g/dL (ref 1.5–4.5)
Glucose: 95 mg/dL (ref 70–99)
Potassium: 4.5 mmol/L (ref 3.5–5.2)
Sodium: 141 mmol/L (ref 134–144)
Total Protein: 6.6 g/dL (ref 6.0–8.5)
eGFR: 40 mL/min/{1.73_m2} — ABNORMAL LOW (ref >59)

## 2022-01-03 LAB — CBC/DIFF AMBIGUOUS DEFAULT
Basophils Absolute: 0.2 10*3/uL (ref 0.0–0.2)
Basos: 2 %
EOS (ABSOLUTE): 0.6 10*3/uL — ABNORMAL HIGH (ref 0.0–0.4)
Eos: 7 %
Hematocrit: 43.4 % (ref 37.5–51.0)
Hemoglobin: 14.3 g/dL (ref 13.0–17.7)
Immature Grans (Abs): 0.1 10*3/uL (ref 0.0–0.1)
Immature Granulocytes: 1 %
Lymphocytes Absolute: 2.9 10*3/uL (ref 0.7–3.1)
Lymphs: 33 %
MCH: 29.2 pg (ref 26.6–33.0)
MCHC: 32.9 g/dL (ref 31.5–35.7)
MCV: 89 fL (ref 79–97)
Monocytes Absolute: 1 10*3/uL — ABNORMAL HIGH (ref 0.1–0.9)
Monocytes: 11 %
Neutrophils Absolute: 4 10*3/uL (ref 1.4–7.0)
Neutrophils: 46 %
Platelets: 283 10*3/uL (ref 150–450)
RBC: 4.89 x10E6/uL (ref 4.14–5.80)
RDW: 15 % (ref 11.6–15.4)
WBC: 8.8 10*3/uL (ref 3.4–10.8)

## 2022-01-03 LAB — VITAMIN B12: Vitamin B-12: 405 pg/mL (ref 232–1245)

## 2022-01-03 LAB — FOLATE: Folate: 6.2 ng/mL (ref >3.0)

## 2022-01-03 LAB — SPECIMEN STATUS REPORT

## 2022-01-09 ENCOUNTER — Telehealth: Payer: Self-pay | Admitting: Physical Medicine and Rehabilitation

## 2022-01-09 DIAGNOSIS — G609 Hereditary and idiopathic neuropathy, unspecified: Secondary | ICD-10-CM

## 2022-01-09 MED ORDER — DULOXETINE HCL 30 MG PO CPEP: 30.0000 mg | ORAL_CAPSULE | Freq: Every day | ORAL | 1 refills | Status: DC

## 2022-01-09 NOTE — Telephone Encounter (Signed)
Left message with [name], patient's spouse. Blood work Crcl improved, sending script for Cymbalta 30 mg once daily #30 tabs R1. We will re-address this medication at his appointment in October and, if repeat blood work looks good, potentially increase to 60 mg daily.   Requested 2 days-1 week after starting medication, patient to call clinic and let us know if he is tolerating the medication or experiencing any side effects.    Gertie Gowda, DO 01/09/2022

## 2022-01-10 DIAGNOSIS — M0579 Rheumatoid arthritis with rheumatoid factor of multiple sites without organ or systems involvement: Secondary | ICD-10-CM | POA: Diagnosis not present

## 2022-01-12 ENCOUNTER — Ambulatory Visit: Payer: Medicare HMO | Admitting: Internal Medicine

## 2022-01-19 ENCOUNTER — Telehealth: Payer: Self-pay

## 2022-01-19 NOTE — Telephone Encounter (Signed)
Patient is aware Dr. Tressa Busman is not in the office today.

## 2022-01-19 NOTE — Telephone Encounter (Signed)
[name] stated he can not take the Duloxetine 30 MG anymore. Because when he does it gives has nightmares.  He is also still taking the Tramadol.  Please advise. Thank you. Call back phone [phone].

## 2022-01-30 ENCOUNTER — Encounter: Payer: Medicare HMO | Attending: Physical Medicine and Rehabilitation | Admitting: Physical Medicine and Rehabilitation

## 2022-01-30 ENCOUNTER — Encounter: Payer: Self-pay | Admitting: Physical Medicine and Rehabilitation

## 2022-01-30 VITALS — BP 130/77 | HR 67 | Ht 67.5 in | Wt 173.0 lb

## 2022-01-30 DIAGNOSIS — G609 Hereditary and idiopathic neuropathy, unspecified: Secondary | ICD-10-CM | POA: Diagnosis not present

## 2022-01-30 DIAGNOSIS — G894 Chronic pain syndrome: Secondary | ICD-10-CM | POA: Diagnosis not present

## 2022-01-30 DIAGNOSIS — M069 Rheumatoid arthritis, unspecified: Secondary | ICD-10-CM | POA: Diagnosis not present

## 2022-01-30 DIAGNOSIS — M4316 Spondylolisthesis, lumbar region: Secondary | ICD-10-CM | POA: Insufficient documentation

## 2022-01-30 MED ORDER — AMITRIPTYLINE HCL 25 MG PO TABS: 25.0000 mg | ORAL_TABLET | Freq: Every day | ORAL | 1 refills | Status: DC

## 2022-01-30 MED ORDER — TRAMADOL HCL 50 MG PO TABS: 50.0000 mg | ORAL_TABLET | Freq: Two times a day (BID) | ORAL | 4 refills | Status: DC | PRN

## 2022-01-30 NOTE — Assessment & Plan Note (Signed)
Stable from pain management standpoint  Non-dermatomal neuropathic pain  Failed to benefit fm ESI in the past  Monitor for any neurologic changes

## 2022-01-30 NOTE — Assessment & Plan Note (Addendum)
Failed Duloxetine 30 mg due to nightmares; added to allergy list as intolerance  Discussed trial of gabapentin QHS only; pt endorses this medication actually interfered with his sleep in the past, would avoid this and Lyrica.   Trial Elavil 25 mg QHS for neuropathy and sleep. Patient to call clinic in ~2 weeks to discuss effect and possible dose adjustments. Goal to assist in weaning off nighttime Tramadol.   Prescribed Elavil 25 mg QHS #30 tabs R1

## 2022-01-30 NOTE — Assessment & Plan Note (Signed)
Refilled Tramadol 50 mg BID PRN #60 tabs R4   Patient to follow up in clinic in 6 months; very stable on current regimen, no side effects.   Indication for chronic opioid: Rheumatoid Arthritis, Neuropathy Medication and dose: Tramadol 50 mg BID PRN # pills per month: 60 Last UDS date: 11/2021 Opioid Treatment Agreement signed (Y/N): Y, 11/2021 Opioid Treatment Agreement last reviewed with patient:   NCCSRS/PDMP reviewed this encounter (include red flags): Yes   Management will include: UDS every 6-12 months NCCSR check every visit Follow up Q3M initially, Q6M once established

## 2022-01-30 NOTE — Progress Notes (Signed)
Subjective:    Patient ID: [name], male    DOB: 1940-11-09, 81 y.o.   MRN: [id number]  HPI  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   Plan from 12/12/21: Hereditary and idiopathic peripheral neuropathy Assessment & Plan: Non-diabetic peripheral neuropathy effecting bilateral legs, attributed to MGUS, present for 10-15 years   Plan for review of labwork form [place], if reasonable will start with Duloxetine 30 mg daily. Patient will call 2 weeks after starting medication and, if no side effects, will increase to 60 mg daily.   If not done recently, will also check labs for neuropathy.     Rheumatoid arthritis involving shoulder, unspecified laterality, unspecified whether rheumatoid factor present [facility]) Assessment & Plan: Currently stable on low-dose prednisone. Continue following with Rheumatology clinic.    No changes in medications at this time. At 2 month follow up, given primary barrier to reducing tramadol is withdrawal symptoms (anxiety, poor sleep), will plan for slow taper starting with dose reduction to 35 mg TID PRN.      Chronic pain syndrome   Pain management contract agreement Assessment & Plan: Pain contract signed today, UDS pending   If UDS results as expected, will prescribe Tramadol 50 mg BID PRN #60 tabs R1 starting 9/18.   Patient to follow up prior to 10/16, with plan as above to wean medication while up-titrating Duloxetine at that time.    Orders: -     ToxAssure Select,+Antidepr,UR   Spondylolisthesis at L4-L5 level Assessment & Plan: Current neuropathic pain is non-dermatomal, and no flagged exam  findings to suggest intervention such as ESI would be beneficial at this time. Additionally, has failed 3x ESI in the past.   Monitor closely for changes and consider repeat imaging and/or EMG if indicated.     Interval Hx: - Trialled Duloxetine, stopped d/t nightmares - Labs obtained after last visit, no concerning findings to contribute to neuropathy - Leg pains are fine "until I walk" - Pt states he failed gabapentin in the past "because my whole body felt funny". He states he doesn't remember exactly what it was because it was so long ago.  - He doesn't remember ever taking Lyrica.  - Never tried Elavil/amitriptyline.  - Pt states when he is off tramadol, he is uncomfortable. He says it helps him sleep at night. He has tried to go "cold [place]" or take it "every other day" in the past without success. He is not able to split his current tablets. He wants to cut down on nighttime dose, but states he has been stable on 50 mg BID for many years and it allows him to do things he enjoys, like golf.    Pain Inventory Average Pain 3 Pain Right Now 3 My pain is burning and tingling  In the last 24 hours, has pain interfered with the following? General activity 5 Relation with others 5 Enjoyment of life 6 What TIME of day is your pain at its worst? night Sleep (in general) Good  Pain is worse with: walking and some activites Pain improves with: rest, heat/ice, pacing activities, and medication Relief from Meds: 7  Family History  Adopted: Yes  Problem Relation Age of Onset  Heart failure Mother    Heart failure Father    Social History   Socioeconomic History   Marital status: Married    Spouse name: [name]   Number of children: 2   Years of education: 12   Highest education level: Not on file  Occupational History    Comment: Chief Financial Officer, retired  Tobacco Use   Smoking status: Never   Smokeless tobacco: Never  Vaping Use   Vaping Use: Never used  Substance and  Sexual Activity   Alcohol use: No   Drug use: No   Sexual activity: Not Currently  Other Topics Concern   Not on file  Social History Narrative   Lives at home with wife   Caffeine - Cokes, 1-2 weekly; coffee, 2-3 cups daily in winter   Social Determinants of Health   Financial Resource Strain: Not on file  Food Insecurity: Not on file  Transportation Needs: Not on file  Physical Activity: Not on file  Stress: Not on file  Social Connections: Not on file   Past Surgical History:  Procedure Laterality Date   BALLOON DILATION N/A 10/01/2012   Procedure: BALLOON DILATION;  Surgeon: Garlan Fair, MD;  Location: [facility];  Service: Endoscopy;  Laterality: N/A;   COLECTOMY N/A 06/10/2013   Procedure: TOTAL abdominal COLECTOMY;  Surgeon: Odis Hollingshead, MD;  Location: [facility];  Service: General;  Laterality: N/A;   COLONOSCOPY     CYSTOSCOPY WITH URETEROSCOPY Right 12/05/2012   Procedure: CYSTOSCOPY WITH RIGHT URETEROSCOPY AND STONE EXTRACTION;  Surgeon: Malka So, MD;  Location: [facility];  Service: Urology;  Laterality: Right;   ESOPHAGOGASTRODUODENOSCOPY N/A 10/01/2012   Procedure: ESOPHAGOGASTRODUODENOSCOPY (EGD);  Surgeon: Garlan Fair, MD;  Location: [facility];  Service: Endoscopy;  Laterality: N/A;   HERNIA REPAIR     INGUINAL HERNIA REPAIR  2007   Bilateral    Lithotripsy for nephrolithiasis     Pt and wife not sure when but here in [place]   Lysis of Adhesion, small bowel resection  2009   MAXIMUM ACCESS (MAS)POSTERIOR LUMBAR INTERBODY FUSION (PLIF) 1 LEVEL N/A 05/08/2016   Procedure: LUMBAR FOUR-FIVE FOR MAXIMUM ACCESS (MAS) POSTERIOR LUMBAR INTERBODY FUSION;  Surgeon: Kary Kos, MD;  Location: [place];  Service: Neurosurgery;  Laterality: N/A;   NISSEN FUNDOPLICATION     about 1610, pt not sure records not currently available   TOTAL KNEE ARTHROPLASTY  2003   Bilateral   TRANSURETHRAL PROSTATECTOMY WITH GYRUS INSTRUMENTS N/A 12/05/2012   Procedure:  TRANSURETHRAL PROSTATECTOMY WITH GYRUS INSTRUMENTS;  Surgeon: Malka So, MD;  Location: [facility];  Service: Urology;  Laterality: N/A;   Ventral Hernia Repair with Mesh     Past Surgical History:  Procedure Laterality Date   BALLOON DILATION N/A 10/01/2012   Procedure: BALLOON DILATION;  Surgeon: Garlan Fair, MD;  Location: [facility];  Service: Endoscopy;  Laterality: N/A;   COLECTOMY N/A 06/10/2013   Procedure: TOTAL abdominal COLECTOMY;  Surgeon: Odis Hollingshead, MD;  Location: [facility];  Service: General;  Laterality: N/A;   COLONOSCOPY     CYSTOSCOPY WITH URETEROSCOPY Right 12/05/2012   Procedure: CYSTOSCOPY WITH RIGHT URETEROSCOPY AND STONE EXTRACTION;  Surgeon: Malka So, MD;  [facility];  Service: Urology;  Laterality: Right;   ESOPHAGOGASTRODUODENOSCOPY N/A 10/01/2012   Procedure: ESOPHAGOGASTRODUODENOSCOPY (EGD);  Surgeon: Garlan Fair, MD;  Location: [facility];  Service: Endoscopy;  Laterality: N/A;   HERNIA REPAIR     INGUINAL HERNIA  REPAIR  2007   Bilateral    Lithotripsy for nephrolithiasis     Pt and wife not sure when but here in [facility]   Lysis of Adhesion, small bowel resection  2009   MAXIMUM ACCESS (MAS)POSTERIOR LUMBAR INTERBODY FUSION (PLIF) 1 LEVEL N/A 05/08/2016   Procedure: LUMBAR FOUR-FIVE FOR MAXIMUM ACCESS (MAS) POSTERIOR LUMBAR INTERBODY FUSION;  Surgeon: Kary Kos, MD;  Location: [place];  Service: Neurosurgery;  Laterality: N/A;   NISSEN FUNDOPLICATION     about 6270, pt not sure records not currently available   TOTAL KNEE ARTHROPLASTY  2003   Bilateral   TRANSURETHRAL PROSTATECTOMY WITH GYRUS INSTRUMENTS N/A 12/05/2012   Procedure: TRANSURETHRAL PROSTATECTOMY WITH GYRUS INSTRUMENTS;  Surgeon: Malka So, MD;  [facility];  Service: Urology;  Laterality: N/A;   Ventral Hernia Repair with Mesh     Past Medical History:  Diagnosis Date   Blood transfusion    " no reaction from transfusion"   BPH (benign prostatic hyperplasia)     Bruises easily    Bursitis    chronic hip pain   Cancer (HCC)    prostate cancer cells   Disc disease, degenerative, lumbar or lumbosacral    Chronic back pain now   Diverticulitis    Esophageal ring    requiring dilations    GERD (gastroesophageal reflux disease)    History of GI bleed    "vessels burst in colon" x2   History of kidney stones    chronic   History of vertigo    Neuropathy    Osteoarthritis    Osteoporosis    Pneumonia    yrs ago   Rash    GROIN - ITCHING PAST 3 MONTHS - it is gone now 05/02/16   Rheumatoid arthritis(714.0)    27 years   Spondylisthesis    Stroke ([place]) 07/2016   Ureteral stone 11/2012   Ht 5' 7.5" (1.715 m)   Wt 173 lb (78.5 kg)   BMI 26.70 kg/m   Opioid Risk Score:   Fall Risk Score:  `1  Depression screen [facility] 2/9     01/30/2022    8:53 AM 12/12/2021    9:14 AM 08/29/2016   10:48 AM  Depression screen PHQ 2/9  Decreased Interest 0 1 0  Down, Depressed, Hopeless 0 0 0  PHQ - 2 Score 0 1 0  Altered sleeping  0 0  Tired, decreased energy  1 2  Change in appetite  0 0  Feeling bad or failure about yourself   0 0  Trouble concentrating  0 0  Moving slowly or fidgety/restless  0 0  Suicidal thoughts  0 0  PHQ-9 Score  2 2  Difficult doing work/chores   Somewhat difficult    Review of Systems  Eyes:  Positive for visual disturbance.  Musculoskeletal:        Pain from both knees down to both feet  All other systems reviewed and are negative.      Objective:   Physical Exam  Constitution: Appropriate appearance for age. No apparnet distress   HEENT: PERRL, EOMI grossly intact.  Resp: CTAB. No rales, rhonchi, or wheezing. Cardio: RRR. No mumurs, rubs, or gallops. No peripheral edema. Abdomen: Nondistended. Nontender. +bowel sounds. Psych: Appropriate mood and affect. Neuro: AAOx4. No apparent deficits   Neurologic Exam:   DTRs: Reflexes were 2+ in bilateral LEs.  Sensory exam: revealed normal sensation in all  dermatomal regions in bilateral  LEs. Motor exam:  strength normal in all myotomal regions of bilateral UE and LEs. Coordination: Fine motor coordination was normal.   Gait: Gait was normal.       Assessment & Plan:  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   Chronic pain syndrome Assessment & Plan: Refilled Tramadol 50 mg BID PRN #60 tabs R4   Patient to follow up in clinic in 6 months; very stable on current regimen, no side effects.   Indication for chronic opioid: Rheumatoid Arthritis, Neuropathy Medication and dose: Tramadol 50 mg BID PRN # pills per month: 60 Last UDS date: 11/2021 Opioid Treatment Agreement signed (Y/N): Y, 11/2021 Opioid Treatment Agreement last reviewed with patient:   NCCSRS/PDMP reviewed this encounter (include red flags): Yes   Management will include: UDS every 6-12 months NCCSR check every visit Follow up Q3M initially, Q6M once established     Rheumatoid arthritis, involving unspecified site, unspecified whether rheumatoid factor present ([place]) Assessment & Plan: Continue low dose steroids and Tx per Rheumatology   Spondylolisthesis at L4-L5 level Assessment & Plan: Stable from pain management standpoint  Non-dermatomal neuropathic pain  Failed to benefit fm ESI in the past  Monitor for any neurologic changes   Hereditary and idiopathic peripheral neuropathy Assessment & Plan: Failed Duloxetine 30 mg due to nightmares; added to allergy list as intolerance  Discussed trial of gabapentin QHS only; pt endorses this medication actually interfered with his sleep in the past, would avoid this and Lyrica.    Trial Elavil 25 mg QHS for neuropathy and sleep. Patient to call clinic in ~2 weeks to discuss effect and possible dose adjustments. Goal to assist in weaning off nighttime Tramadol.   Prescribed Elavil 25 mg QHS #30 tabs R1   Other orders -     Amitriptyline HCl; Take 1 tablet (25 mg total) by mouth at bedtime.  Dispense: 30 tablet; Refill: 1 -     traMADol HCl; Take 1 tablet (50 mg total) by mouth every 12 (twelve) hours as needed for moderate pain or severe pain.  Dispense: 60 tablet; Refill: [place], DO 01/30/2022

## 2022-01-30 NOTE — Patient Instructions (Addendum)
You have been started on Elavil 25 mg one tablet nightly. If tolerating well, call me after 2-3 weeks and we can discuss increasing it to 50 mg at night to assist in coming off of PM Tramadol.  Your Tramadol 50 mg twice daily as needed has been sent to your pharmacy through the beginning of February. CONTACT THE CLINIC WHEN YOU ARE 1-2 WEEK FROM RUNNING OUT OF MEDICATION TO ALLOW PHARMACY TO FILL ON TIME.   Follow IN 6 MONTHS to discuss medication weaning and pain management.  Have a great holiday season!

## 2022-01-30 NOTE — Assessment & Plan Note (Signed)
Continue low dose steroids and Tx per Rheumatology

## 2022-02-08 ENCOUNTER — Ambulatory Visit: Payer: Medicare HMO | Admitting: Physical Medicine and Rehabilitation

## 2022-02-21 ENCOUNTER — Other Ambulatory Visit: Payer: Self-pay | Admitting: Physical Medicine and Rehabilitation

## 2022-04-18 DIAGNOSIS — M1991 Primary osteoarthritis, unspecified site: Secondary | ICD-10-CM | POA: Diagnosis not present

## 2022-04-18 DIAGNOSIS — E663 Overweight: Secondary | ICD-10-CM | POA: Diagnosis not present

## 2022-04-18 DIAGNOSIS — Z6826 Body mass index (BMI) 26.0-26.9, adult: Secondary | ICD-10-CM | POA: Diagnosis not present

## 2022-04-18 DIAGNOSIS — M0579 Rheumatoid arthritis with rheumatoid factor of multiple sites without organ or systems involvement: Secondary | ICD-10-CM | POA: Diagnosis not present

## 2022-04-18 DIAGNOSIS — Z79899 Other long term (current) drug therapy: Secondary | ICD-10-CM | POA: Diagnosis not present

## 2022-04-24 DIAGNOSIS — M1990 Unspecified osteoarthritis, unspecified site: Secondary | ICD-10-CM | POA: Diagnosis not present

## 2022-04-24 DIAGNOSIS — K219 Gastro-esophageal reflux disease without esophagitis: Secondary | ICD-10-CM | POA: Diagnosis not present

## 2022-04-24 DIAGNOSIS — I739 Peripheral vascular disease, unspecified: Secondary | ICD-10-CM | POA: Diagnosis not present

## 2022-04-24 DIAGNOSIS — N1832 Chronic kidney disease, stage 3b: Secondary | ICD-10-CM | POA: Diagnosis not present

## 2022-04-24 DIAGNOSIS — I129 Hypertensive chronic kidney disease with stage 1 through stage 4 chronic kidney disease, or unspecified chronic kidney disease: Secondary | ICD-10-CM | POA: Diagnosis not present

## 2022-04-24 DIAGNOSIS — Z8673 Personal history of transient ischemic attack (TIA), and cerebral infarction without residual deficits: Secondary | ICD-10-CM | POA: Diagnosis not present

## 2022-04-24 DIAGNOSIS — Z880 Allergy status to penicillin: Secondary | ICD-10-CM | POA: Diagnosis not present

## 2022-04-24 DIAGNOSIS — M0550 Rheumatoid polyneuropathy with rheumatoid arthritis of unspecified site: Secondary | ICD-10-CM | POA: Diagnosis not present

## 2022-04-24 DIAGNOSIS — H9193 Unspecified hearing loss, bilateral: Secondary | ICD-10-CM | POA: Diagnosis not present

## 2022-04-24 DIAGNOSIS — Z7901 Long term (current) use of anticoagulants: Secondary | ICD-10-CM | POA: Diagnosis not present

## 2022-04-24 DIAGNOSIS — N529 Male erectile dysfunction, unspecified: Secondary | ICD-10-CM | POA: Diagnosis not present

## 2022-04-24 DIAGNOSIS — D6869 Other thrombophilia: Secondary | ICD-10-CM | POA: Diagnosis not present

## 2022-04-24 DIAGNOSIS — Z008 Encounter for other general examination: Secondary | ICD-10-CM | POA: Diagnosis not present

## 2022-05-09 ENCOUNTER — Telehealth: Payer: Self-pay | Admitting: Physical Medicine and Rehabilitation

## 2022-05-09 NOTE — Telephone Encounter (Signed)
Santiago Glad with [facility] called to let us know that insurance had denied testing for patient on 12/12/21.  Please call them at [phone] to discuss this.  Please reference# [id number].

## 2022-07-18 DIAGNOSIS — M0579 Rheumatoid arthritis with rheumatoid factor of multiple sites without organ or systems involvement: Secondary | ICD-10-CM | POA: Diagnosis not present

## 2022-08-02 ENCOUNTER — Encounter: Payer: Self-pay | Admitting: Physical Medicine and Rehabilitation

## 2022-08-02 ENCOUNTER — Encounter: Payer: Medicare HMO | Attending: Physical Medicine and Rehabilitation | Admitting: Physical Medicine and Rehabilitation

## 2022-08-02 VITALS — BP 137/77 | HR 62 | Ht 67.5 in | Wt 177.0 lb

## 2022-08-02 DIAGNOSIS — G894 Chronic pain syndrome: Secondary | ICD-10-CM | POA: Diagnosis not present

## 2022-08-02 DIAGNOSIS — G609 Hereditary and idiopathic neuropathy, unspecified: Secondary | ICD-10-CM | POA: Insufficient documentation

## 2022-08-02 DIAGNOSIS — M069 Rheumatoid arthritis, unspecified: Secondary | ICD-10-CM | POA: Insufficient documentation

## 2022-08-02 MED ORDER — TRAMADOL HCL 50 MG PO TABS: 50.0000 mg | ORAL_TABLET | Freq: Two times a day (BID) | ORAL | 5 refills | Status: DC | PRN

## 2022-08-02 NOTE — Patient Instructions (Signed)
Today, I refilled your tramadol for 6 months.  First fill is on 4/26.  He will follow back up with myself or Riley Lam in 6 months for ongoing pain management.  For the neuropathy in your feet, I am giving you information on Qutenza.  In the meantime, you can use Aspercreme up to 4 times daily as needed on the legs to see if this helps with your pain.  Will follow-up about the Qutenza next visit.

## 2022-08-02 NOTE — Progress Notes (Signed)
Subjective:    Patient ID: Hyun F [name], male    DOB: 06-06-1940, 82 y.o.   MRN: [id number]  HPI   [name] is a 82 y.o. year old male  who  has a past medical history of Blood transfusion, BPH (benign prostatic hyperplasia), Bruises easily, Bursitis, Cancer, Disc disease, degenerative, lumbar or lumbosacral, Diverticulitis, Esophageal ring, GERD (gastroesophageal reflux disease), History of GI bleed, History of kidney stones, History of vertigo, Neuropathy, Osteoarthritis, Osteoporosis, Pneumonia, Rash, Rheumatoid arthritis(714.0), Spondylisthesis, Stroke (07/2016), and Ureteral stone (11/2012).   They are presenting to PM&R clinic for follow up related to chronic pain .  Plan from last visit:  Chronic pain syndrome Assessment & Plan: Refilled Tramadol 50 mg BID PRN #60 tabs R4    Patient to follow up in clinic in 6 months; very stable on current regimen, no side effects.    Indication for chronic opioid: Rheumatoid Arthritis, Neuropathy Medication and dose: Tramadol 50 mg BID PRN # pills per month: 60 Last UDS date: 11/2021 Opioid Treatment Agreement signed (Y/N): Y, 11/2021 Opioid Treatment Agreement last reviewed with patient:   NCCSRS/PDMP reviewed this encounter (include red flags): Yes    Management will include: UDS every 6-12 months NCCSR check every visit Follow up Q3M initially, Q6M once established         Rheumatoid arthritis, involving unspecified site, unspecified whether rheumatoid factor present (HCC) Assessment & Plan: Continue low dose steroids and Tx per Rheumatology     Spondylolisthesis at L4-L5 level Assessment & Plan: Stable from pain management standpoint   Non-dermatomal neuropathic pain   Failed to benefit fm ESI in the past   Monitor for any neurologic changes     Hereditary and idiopathic peripheral neuropathy Assessment & Plan: Failed Duloxetine 30 mg due to nightmares; added to allergy list as intolerance   Discussed trial  of gabapentin QHS only; pt endorses this medication actually interfered with his sleep in the past, would avoid this and Lyrica.    Trial Elavil 25 mg QHS for neuropathy and sleep. Patient to call clinic in ~2 weeks to discuss effect and possible dose adjustments. Goal to assist in weaning off nighttime Tramadol.    Prescribed Elavil 25 mg QHS #30 tabs R1   Interval Hx:  - Follow ups:  had bloodwork a week ago, HgB was low but otherwise ok. Seeing rheumatologist next week.   - Therapies: Endorses weakness son his L side from prior stroke. States he has extensively been in therapies for this, stability, and using TENS unit; is not interested in returning to therapies.    - Falls: none. Doing all activities he wants to do; [place] the [place].    - DME: none   - Medications: Elavil made him too tired; he couldn't wake up in the morning. Tramadol 50 mg BID filled 07/12/22 - doing well with this. Not interested in adjusting at this time.   Still on prednisone 5 mg daily;   - Other concerns: States "my neuropathy hurts more than my RA does". Has failed gabapentin, duloxetine, and elavil at this point.  States the worst part is in the toes and the tops of his feet. Not interested in additional medications for this, has not tried creams or patches.    Pain Inventory Average Pain 3 Pain Right Now 4 My pain is intermittent and aching  In the last 24 hours, has pain interfered with the following? General activity 3 Relation with others 3  Enjoyment of life 3 What TIME of day is your pain at its worst? daytime Sleep (in general) Good  Pain is worse with: bending and some activites Pain improves with: rest, heat/ice, and medication Relief from Meds: 7  Family History  Adopted: Yes  Problem Relation Age of Onset   Heart failure Mother    Heart failure Father    Social History   Socioeconomic History   Marital status: Married    Spouse name: Darel Hong   Number of  children: 2   Years of education: 12   Highest education level: Not on file  Occupational History    Comment: Radio broadcast assistant, retired  Tobacco Use   Smoking status: Never   Smokeless tobacco: Never  Vaping Use   Vaping Use: Never used  Substance and Sexual Activity   Alcohol use: No   Drug use: No   Sexual activity: Not Currently  Other Topics Concern   Not on file  Social History Narrative   Lives at home with wife   Caffeine - Cokes, 1-2 weekly; coffee, 2-3 cups daily in winter   Social Determinants of Health   Financial Resource Strain: Not on file  Food Insecurity: Not on file  Transportation Needs: Not on file  Physical Activity: Not on file  Stress: Not on file  Social Connections: Not on file   Past Surgical History:  Procedure Laterality Date   BALLOON DILATION N/A 10/01/2012   Procedure: BALLOON DILATION;  Surgeon: Charolett Bumpers, MD;  Location: [facility];  Service: Endoscopy;  Laterality: N/A;   COLECTOMY N/A 06/10/2013   Procedure: TOTAL abdominal COLECTOMY;  Surgeon: Adolph Pollack, MD;  Location: [facility];  Service: General;  Laterality: N/A;   COLONOSCOPY     CYSTOSCOPY WITH URETEROSCOPY Right 12/05/2012   Procedure: CYSTOSCOPY WITH RIGHT URETEROSCOPY AND STONE EXTRACTION;  Surgeon: Anner Crete, MD;  [facility];  Service: Urology;  Laterality: Right;   ESOPHAGOGASTRODUODENOSCOPY N/A 10/01/2012   Procedure: ESOPHAGOGASTRODUODENOSCOPY (EGD);  Surgeon: Charolett Bumpers, MD;  Location: [facility];  Service: Endoscopy;  Laterality: N/A;   HERNIA REPAIR     INGUINAL HERNIA REPAIR  2007   Bilateral    Lithotripsy for nephrolithiasis     Pt and wife not sure when but here in [place]   Lysis of Adhesion, small bowel resection  2009   MAXIMUM ACCESS (MAS)POSTERIOR LUMBAR INTERBODY FUSION (PLIF) 1 LEVEL N/A 05/08/2016   Procedure: LUMBAR FOUR-FIVE FOR MAXIMUM ACCESS (MAS) POSTERIOR LUMBAR INTERBODY FUSION;  Surgeon: Donalee Citrin, MD;  [facility];  Service: Neurosurgery;  Laterality: N/A;   NISSEN FUNDOPLICATION     about 2005, pt not sure records not currently available   TOTAL KNEE ARTHROPLASTY  2003   Bilateral   TRANSURETHRAL PROSTATECTOMY WITH GYRUS INSTRUMENTS N/A 12/05/2012   Procedure: TRANSURETHRAL PROSTATECTOMY WITH GYRUS INSTRUMENTS;  Surgeon: Anner Crete, MD;  [facility];  Service: Urology;  Laterality: N/A;   Ventral Hernia Repair with Mesh     Past Surgical History:  Procedure Laterality Date   BALLOON DILATION N/A 10/01/2012   Procedure: BALLOON DILATION;  Surgeon: Charolett Bumpers, MD;  [facility];  Service: Endoscopy;  Laterality: N/A;   COLECTOMY N/A 06/10/2013   Procedure: TOTAL abdominal COLECTOMY;  Surgeon: Adolph Pollack, MD;  [facility];  Service: General;  Laterality: N/A;   COLONOSCOPY     CYSTOSCOPY WITH URETEROSCOPY Right 12/05/2012   Procedure: CYSTOSCOPY WITH RIGHT URETEROSCOPY AND STONE  EXTRACTION;  Surgeon: Anner Crete, MD;  [facility];  Service: Urology;  Laterality: Right;   ESOPHAGOGASTRODUODENOSCOPY N/A 10/01/2012   Procedure: ESOPHAGOGASTRODUODENOSCOPY (EGD);  Surgeon: Charolett Bumpers, MD;  Location: [facility];  Service: Endoscopy;  Laterality: N/A;   HERNIA REPAIR     INGUINAL HERNIA REPAIR  2007   Bilateral    Lithotripsy for nephrolithiasis     Pt and wife not sure when but here in [place]   Lysis of Adhesion, small bowel resection  2009   MAXIMUM ACCESS (MAS)POSTERIOR LUMBAR INTERBODY FUSION (PLIF) 1 LEVEL N/A 05/08/2016   Procedure: LUMBAR FOUR-FIVE FOR MAXIMUM ACCESS (MAS) POSTERIOR LUMBAR INTERBODY FUSION;  Surgeon: Donalee Citrin, MD;  [facility];  Service: Neurosurgery;  Laterality: N/A;   NISSEN FUNDOPLICATION     about 2005, pt not sure records not currently available   TOTAL KNEE ARTHROPLASTY  2003   Bilateral   TRANSURETHRAL PROSTATECTOMY WITH GYRUS INSTRUMENTS N/A 12/05/2012   Procedure: TRANSURETHRAL PROSTATECTOMY WITH GYRUS INSTRUMENTS;   Surgeon: Anner Crete, MD;  [facility];  Service: Urology;  Laterality: N/A;   Ventral Hernia Repair with Mesh     Past Medical History:  Diagnosis Date   Blood transfusion    " no reaction from transfusion"   BPH (benign prostatic hyperplasia)    Bruises easily    Bursitis    chronic hip pain   Cancer (HCC)    prostate cancer cells   Disc disease, degenerative, lumbar or lumbosacral    Chronic back pain now   Diverticulitis    Esophageal ring    requiring dilations    GERD (gastroesophageal reflux disease)    History of GI bleed    "vessels burst in colon" x2   History of kidney stones    chronic   History of vertigo    Neuropathy    Osteoarthritis    Osteoporosis    Pneumonia    yrs ago   Rash    GROIN - ITCHING PAST 3 MONTHS - it is gone now 05/02/16   Rheumatoid arthritis(714.0)    27 years   Spondylisthesis    Stroke (HCC) 07/2016   Ureteral stone 11/2012   There were no vitals taken for this visit.  Opioid Risk Score:   Fall Risk Score:  `1  Depression screen [facility] 2/9     01/30/2022    8:53 AM 12/12/2021    9:14 AM 08/29/2016   10:48 AM  Depression screen PHQ 2/9  Decreased Interest 0 1 0  Down, Depressed, Hopeless 0 0 0  PHQ - 2 Score 0 1 0  Altered sleeping  0 0  Tired, decreased energy  1 2  Change in appetite  0 0  Feeling bad or failure about yourself   0 0  Trouble concentrating  0 0  Moving slowly or fidgety/restless  0 0  Suicidal thoughts  0 0  PHQ-9 Score  2 2  Difficult doing work/chores   Somewhat difficult    Review of Systems  Musculoskeletal:        Pain in both shoulders & both hands  All other systems reviewed and are negative.      Objective:   Physical Exam  Constitution: Appropriate appearance for age. No apparnet distress   HEENT: PERRL, EOMI grossly intact.  Resp: CTAB. No rales, rhonchi, or wheezing. Cardio: RRR. No mumurs, rubs, or gallops. No peripheral edema. Abdomen: Nondistended. Nontender. +bowel  sounds. Psych: Appropriate mood and  affect. Neuro: AAOx4. No apparent deficits    Neurologic Exam:   DTRs: No clonus on b/l ankle jerk.  Sensory exam: revealed normal sensation in all dermatomal regions in bilateral  Les to light touch. No sensitivity to palpation.  Motor exam: strength normal in all myotomal regions of bilateral UE and LEs. Coordination: Fine motor coordination was normal.   Gait: Gait was normal.         Assessment & Plan:   [name] is a 82 y.o. year old male  who  has a past medical history of Blood transfusion, BPH (benign prostatic hyperplasia), Bruises easily, Bursitis, Cancer, Disc disease, degenerative, lumbar or lumbosacral, Diverticulitis, Esophageal ring, GERD (gastroesophageal reflux disease), History of GI bleed, History of kidney stones, History of vertigo, Neuropathy, Osteoarthritis, Osteoporosis, Pneumonia, Rash, Rheumatoid arthritis(714.0), Spondylisthesis, Stroke (07/2016), and Ureteral stone (11/2012).   They are presenting to PM&R clinic as a new patient for treatment of Low back and peripheral neuropathic pain .  Chronic pain syndrome Refilled Tramadol 50 mg BID PRN #60 tabs R5   Patient to follow up in clinic in 6 months; very stable on current regimen, no side effects.    Indication for chronic opioid: Rheumatoid Arthritis, Neuropathy Medication and dose: Tramadol 50 mg BID PRN # pills per month: 60 Last UDS date: 11/2021 Opioid Treatment Agreement signed (Y/N): Y, 11/2021 Opioid Treatment Agreement last reviewed with patient:   NCCSRS/PDMP reviewed this encounter (include red flags): Yes    Management will include: UDS every 6-12 months NCCSR check every visit Follow up Q6M   Rheumatoid arthritis, involving unspecified site, unspecified whether rheumatoid factor present use Aspercreme up to 4 times daily as needed on the legs to see if this helps with your pain.    Hereditary and idiopathic peripheral neuropathy For the  neuropathy in your feet, I am giving you information on Qutenza.    Will follow-up about the Qutenza next visit.  Other orders -     traMADol HCl; Take 1 tablet (50 mg total) by mouth every 12 (twelve) hours as needed for moderate pain or severe pain.  Dispense: 60 tablet; Refill: 5

## 2022-10-03 DIAGNOSIS — I129 Hypertensive chronic kidney disease with stage 1 through stage 4 chronic kidney disease, or unspecified chronic kidney disease: Secondary | ICD-10-CM | POA: Diagnosis not present

## 2022-10-03 DIAGNOSIS — I5189 Other ill-defined heart diseases: Secondary | ICD-10-CM | POA: Diagnosis not present

## 2022-10-03 DIAGNOSIS — I480 Paroxysmal atrial fibrillation: Secondary | ICD-10-CM | POA: Diagnosis not present

## 2022-10-03 DIAGNOSIS — D6869 Other thrombophilia: Secondary | ICD-10-CM | POA: Diagnosis not present

## 2022-10-03 DIAGNOSIS — D84821 Immunodeficiency due to drugs: Secondary | ICD-10-CM | POA: Diagnosis not present

## 2022-10-03 DIAGNOSIS — D500 Iron deficiency anemia secondary to blood loss (chronic): Secondary | ICD-10-CM | POA: Diagnosis not present

## 2022-10-03 DIAGNOSIS — R600 Localized edema: Secondary | ICD-10-CM | POA: Diagnosis not present

## 2022-10-03 DIAGNOSIS — N1832 Chronic kidney disease, stage 3b: Secondary | ICD-10-CM | POA: Diagnosis not present

## 2022-10-03 DIAGNOSIS — J439 Emphysema, unspecified: Secondary | ICD-10-CM | POA: Diagnosis not present

## 2022-10-03 DIAGNOSIS — D472 Monoclonal gammopathy: Secondary | ICD-10-CM | POA: Diagnosis not present

## 2022-10-17 DIAGNOSIS — D6869 Other thrombophilia: Secondary | ICD-10-CM | POA: Diagnosis not present

## 2022-10-17 DIAGNOSIS — M1991 Primary osteoarthritis, unspecified site: Secondary | ICD-10-CM | POA: Diagnosis not present

## 2022-10-17 DIAGNOSIS — Z6826 Body mass index (BMI) 26.0-26.9, adult: Secondary | ICD-10-CM | POA: Diagnosis not present

## 2022-10-17 DIAGNOSIS — I340 Nonrheumatic mitral (valve) insufficiency: Secondary | ICD-10-CM | POA: Diagnosis not present

## 2022-10-17 DIAGNOSIS — R600 Localized edema: Secondary | ICD-10-CM | POA: Diagnosis not present

## 2022-10-17 DIAGNOSIS — M7022 Olecranon bursitis, left elbow: Secondary | ICD-10-CM | POA: Diagnosis not present

## 2022-10-17 DIAGNOSIS — M0579 Rheumatoid arthritis with rheumatoid factor of multiple sites without organ or systems involvement: Secondary | ICD-10-CM | POA: Diagnosis not present

## 2022-10-17 DIAGNOSIS — D500 Iron deficiency anemia secondary to blood loss (chronic): Secondary | ICD-10-CM | POA: Diagnosis not present

## 2022-10-17 DIAGNOSIS — E663 Overweight: Secondary | ICD-10-CM | POA: Diagnosis not present

## 2022-10-17 DIAGNOSIS — D508 Other iron deficiency anemias: Secondary | ICD-10-CM | POA: Diagnosis not present

## 2022-10-17 DIAGNOSIS — M25422 Effusion, left elbow: Secondary | ICD-10-CM | POA: Diagnosis not present

## 2022-10-17 DIAGNOSIS — Z79899 Other long term (current) drug therapy: Secondary | ICD-10-CM | POA: Diagnosis not present

## 2022-10-17 DIAGNOSIS — M7021 Olecranon bursitis, right elbow: Secondary | ICD-10-CM | POA: Diagnosis not present

## 2022-10-17 DIAGNOSIS — I480 Paroxysmal atrial fibrillation: Secondary | ICD-10-CM | POA: Diagnosis not present

## 2022-10-17 DIAGNOSIS — D84821 Immunodeficiency due to drugs: Secondary | ICD-10-CM | POA: Diagnosis not present

## 2022-10-17 DIAGNOSIS — G894 Chronic pain syndrome: Secondary | ICD-10-CM | POA: Diagnosis not present

## 2022-10-17 DIAGNOSIS — I5189 Other ill-defined heart diseases: Secondary | ICD-10-CM | POA: Diagnosis not present

## 2022-10-17 DIAGNOSIS — D472 Monoclonal gammopathy: Secondary | ICD-10-CM | POA: Diagnosis not present

## 2022-10-17 DIAGNOSIS — M069 Rheumatoid arthritis, unspecified: Secondary | ICD-10-CM | POA: Diagnosis not present

## 2022-10-17 DIAGNOSIS — K219 Gastro-esophageal reflux disease without esophagitis: Secondary | ICD-10-CM | POA: Diagnosis not present

## 2022-10-20 DIAGNOSIS — K2100 Gastro-esophageal reflux disease with esophagitis, without bleeding: Secondary | ICD-10-CM | POA: Diagnosis not present

## 2022-10-20 DIAGNOSIS — D509 Iron deficiency anemia, unspecified: Secondary | ICD-10-CM | POA: Diagnosis not present

## 2023-01-01 DIAGNOSIS — Z0000 Encounter for general adult medical examination without abnormal findings: Secondary | ICD-10-CM | POA: Diagnosis not present

## 2023-01-01 DIAGNOSIS — N1832 Chronic kidney disease, stage 3b: Secondary | ICD-10-CM | POA: Diagnosis not present

## 2023-01-01 DIAGNOSIS — D500 Iron deficiency anemia secondary to blood loss (chronic): Secondary | ICD-10-CM | POA: Diagnosis not present

## 2023-01-01 DIAGNOSIS — K909 Intestinal malabsorption, unspecified: Secondary | ICD-10-CM | POA: Diagnosis not present

## 2023-01-01 DIAGNOSIS — Z1331 Encounter for screening for depression: Secondary | ICD-10-CM | POA: Diagnosis not present

## 2023-01-17 DIAGNOSIS — M0579 Rheumatoid arthritis with rheumatoid factor of multiple sites without organ or systems involvement: Secondary | ICD-10-CM | POA: Diagnosis not present

## 2023-01-18 ENCOUNTER — Other Ambulatory Visit: Payer: Self-pay

## 2023-01-18 MED ORDER — TRAMADOL HCL 50 MG PO TABS: 50.0000 mg | ORAL_TABLET | Freq: Two times a day (BID) | ORAL | 0 refills | Status: DC | PRN

## 2023-01-18 NOTE — Telephone Encounter (Signed)
Mr. [place] call for a refill of Tramadol 50 MG. [facility].  PMP Report: Filled  Written  ID  Drug  QTY  Days  Prescriber  RX #  Dispenser  Refill  Daily Dose*  Pymt Type  PMP  01/11/2023 08/02/2022 1  Tramadol Hcl 50 Mg Tablet 60.00 30 Mo Eng [id number] Nor (6468) 5/5 20.00 MME Medicare [place] 12/08/2022 08/02/2022 1  Tramadol Hcl 50 Mg Tablet 60.00 30 Mo Eng [id number] Nor (6468) 4/5 20.00 MME Medicare [place] 11/10/2022 08/02/2022 1  Tramadol Hcl 50 Mg Tablet 60.00 30 Mo Eng [id number] Nor ([id number]) 3/5 20.00 MME Medicare [place] 10/09/2022 08/02/2022 1  Tramadol Hcl 50 Mg Tablet 60.00 30 Mo Eng [id number] Nor ([id number]) 2/5 20.00 MME Medicare [place] 09/07/2022 08/02/2022 1  Tramadol Hcl 50 Mg Tablet 60.00 30 Mo Eng [id number] Nor ([id number]) 1/5 20.00 MME Medicare 

## 2023-01-31 NOTE — Progress Notes (Signed)
Subjective:    Patient ID: [name], male    DOB: 08/06/1940, 82 y.o.   MRN: [id number]  HPI: [name] is a 82 y.o. male who returns for follow up appointment for chronic pain and medication refill. He states his pain is located in his bilateral hands, bilateral feet with tingling and burning. We discussed Qutenza, he's not interested at this time, he will call office if he changes his mind, he verbalizes understanding. He rates his pain 8. His current exercise regime is walking and performing stretching exercises.  [name] Morphine equivalent is 20.00 MME.   Last UDS was Performed on 12/13/2022, it was consistent.   Apical Pulse: 60    Pain Inventory Average Pain 7 Pain Right Now 8 My pain is intermittent and aching  In the last 24 hours, has pain interfered with the following? General activity 5 Relation with others 6 Enjoyment of life 7 What TIME of day is your pain at its worst? night Sleep (in general) Fair  Pain is worse with: walking Pain improves with: rest and medication Relief from Meds: 8  Family History  Adopted: Yes  Problem Relation Age of Onset   Heart failure Mother    Heart failure Father    Social History   Socioeconomic History   Marital status: Married    Spouse name: Darel Hong   Number of children: 2   Years of education: 12   Highest education level: Not on file  Occupational History    Comment: Radio broadcast assistant, retired  Tobacco Use   Smoking status: Never   Smokeless tobacco: Never  Vaping Use   Vaping status: Never Used  Substance and Sexual Activity   Alcohol use: No   Drug use: No   Sexual activity: Not Currently  Other Topics Concern   Not on file  Social History Narrative   Lives at home with wife   Caffeine - Cokes, 1-2 weekly; coffee, 2-3 cups daily in winter   Social Determinants of Health   Financial Resource Strain: Not on file  Food Insecurity: Not on file  Transportation Needs: Not on file  Physical  Activity: Not on file  Stress: Not on file  Social Connections: Not on file   Past Surgical History:  Procedure Laterality Date   BALLOON DILATION N/A 10/01/2012   Procedure: BALLOON DILATION;  Surgeon: Charolett Bumpers, MD;  [facility];  Service: Endoscopy;  Laterality: N/A;   COLECTOMY N/A 06/10/2013   Procedure: TOTAL abdominal COLECTOMY;  Surgeon: Adolph Pollack, MD;  [facility];  Service: General;  Laterality: N/A;   COLONOSCOPY     CYSTOSCOPY WITH URETEROSCOPY Right 12/05/2012   Procedure: CYSTOSCOPY WITH RIGHT URETEROSCOPY AND STONE EXTRACTION;  Surgeon: Anner Crete, MD;  [facility];  Service: Urology;  Laterality: Right;   ESOPHAGOGASTRODUODENOSCOPY N/A 10/01/2012   Procedure: ESOPHAGOGASTRODUODENOSCOPY (EGD);  Surgeon: Charolett Bumpers, MD;  Location: [facility];  Service: Endoscopy;  Laterality: N/A;   HERNIA REPAIR     INGUINAL HERNIA REPAIR  2007   Bilateral    Lithotripsy for nephrolithiasis     Pt and wife not sure when but here in [place]   Lysis of Adhesion, small bowel resection  2009   MAXIMUM ACCESS (MAS)POSTERIOR LUMBAR INTERBODY FUSION (PLIF) 1 LEVEL N/A 05/08/2016   Procedure: LUMBAR FOUR-FIVE FOR MAXIMUM ACCESS (MAS) POSTERIOR LUMBAR INTERBODY FUSION;  Surgeon: Donalee Citrin, MD;  [facility];  Service: Neurosurgery;  Laterality: N/A;  NISSEN FUNDOPLICATION     about 2005, pt not sure records not currently available   TOTAL KNEE ARTHROPLASTY  2003   Bilateral   TRANSURETHRAL PROSTATECTOMY WITH GYRUS INSTRUMENTS N/A 12/05/2012   Procedure: TRANSURETHRAL PROSTATECTOMY WITH GYRUS INSTRUMENTS;  Surgeon: Anner Crete, MD;  [facility];  Service: Urology;  Laterality: N/A;   Ventral Hernia Repair with Mesh     Past Surgical History:  Procedure Laterality Date   BALLOON DILATION N/A 10/01/2012   Procedure: BALLOON DILATION;  Surgeon: Charolett Bumpers, MD;  [facility];  Service: Endoscopy;  Laterality: N/A;   COLECTOMY N/A  06/10/2013   Procedure: TOTAL abdominal COLECTOMY;  Surgeon: Adolph Pollack, MD;  [facility];  Service: General;  Laterality: N/A;   COLONOSCOPY     CYSTOSCOPY WITH URETEROSCOPY Right 12/05/2012   Procedure: CYSTOSCOPY WITH RIGHT URETEROSCOPY AND STONE EXTRACTION;  Surgeon: Anner Crete, MD;  [facility];  Service: Urology;  Laterality: Right;   ESOPHAGOGASTRODUODENOSCOPY N/A 10/01/2012   Procedure: ESOPHAGOGASTRODUODENOSCOPY (EGD);  Surgeon: Charolett Bumpers, MD;  Location: [facility];  Service: Endoscopy;  Laterality: N/A;   HERNIA REPAIR     INGUINAL HERNIA REPAIR  2007   Bilateral    Lithotripsy for nephrolithiasis     Pt and wife not sure when but here in [place]   Lysis of Adhesion, small bowel resection  2009   MAXIMUM ACCESS (MAS)POSTERIOR LUMBAR INTERBODY FUSION (PLIF) 1 LEVEL N/A 05/08/2016   Procedure: LUMBAR FOUR-FIVE FOR MAXIMUM ACCESS (MAS) POSTERIOR LUMBAR INTERBODY FUSION;  Surgeon: Donalee Citrin, MD;  [facility];  Service: Neurosurgery;  Laterality: N/A;   NISSEN FUNDOPLICATION     about 2005, pt not sure records not currently available   TOTAL KNEE ARTHROPLASTY  2003   Bilateral   TRANSURETHRAL PROSTATECTOMY WITH GYRUS INSTRUMENTS N/A 12/05/2012   Procedure: TRANSURETHRAL PROSTATECTOMY WITH GYRUS INSTRUMENTS;  Surgeon: Anner Crete, MD;  [facility];  Service: Urology;  Laterality: N/A;   Ventral Hernia Repair with Mesh     Past Medical History:  Diagnosis Date   Blood transfusion    " no reaction from transfusion"   BPH (benign prostatic hyperplasia)    Bruises easily    Bursitis    chronic hip pain   Cancer (HCC)    prostate cancer cells   Disc disease, degenerative, lumbar or lumbosacral    Chronic back pain now   Diverticulitis    Esophageal ring    requiring dilations    GERD (gastroesophageal reflux disease)    History of GI bleed    "vessels burst in colon" x2   History of kidney stones    chronic   History of vertigo     Neuropathy    Osteoarthritis    Osteoporosis    Pneumonia    yrs ago   Rash    GROIN - ITCHING PAST 3 MONTHS - it is gone now 05/02/16   Rheumatoid arthritis(714.0)    27 years   Spondylisthesis    Stroke (HCC) 07/2016   Ureteral stone 11/2012   There were no vitals taken for this visit.  Opioid Risk Score:   Fall Risk Score:  `1  Depression screen [facility] 2/9     08/02/2022    9:15 AM 01/30/2022    8:53 AM 12/12/2021    9:14 AM 08/29/2016   10:48 AM  Depression screen PHQ 2/9  Decreased Interest 0 0 1 0  Down, Depressed, Hopeless 0  0 0 0  PHQ - 2 Score 0 0 1 0  Altered sleeping 0  0 0  Tired, decreased energy   1 2  Change in appetite   0 0  Feeling bad or failure about yourself    0 0  Trouble concentrating   0 0  Moving slowly or fidgety/restless   0 0  Suicidal thoughts   0 0  PHQ-9 Score 0  2 2  Difficult doing work/chores    Somewhat difficult      Review of Systems  Musculoskeletal:  Positive for gait problem.       Right wrist pain  All other systems reviewed and are negative.     Objective:   Physical Exam Vitals and nursing note reviewed.  Constitutional:      Appearance: Normal appearance.  Cardiovascular:     Rate and Rhythm: Normal rate and regular rhythm.     Pulses: Normal pulses.     Heart sounds: Normal heart sounds.  Pulmonary:     Effort: Pulmonary effort is normal.     Breath sounds: Normal breath sounds.  Musculoskeletal:     Cervical back: Normal range of motion and neck supple.     Comments: Normal Muscle Bulk and Muscle Testing Reveals:  Upper Extremities: Full ROM and Muscle Strength 5/5 Lower Extremities: Full ROM and Muscle Strength 5/5 Arises from chair with ease Narrow Based  Gait     Skin:    General: Skin is warm and dry.  Neurological:     Mental Status: He is alert and oriented to person, place, and time.  Psychiatric:        Mood and Affect: Mood normal.        Behavior: Behavior normal.         Assessment &  Plan:  Rheumatoid Arthritis: Continue current medication regimen. Continue HEP as Tolerated. Continue to Monitor.  Hereditary and Idiopathic Neuropathy: Continue current medication regimen. Continue to Monitor.  Chronic Pain Syndrome: Refilled:  Tramadol 50 mg one tablet twice a day as needed for pain #60. We will continue the opioid monitoring program, this consists of regular clinic visits, examinations, urine drug screen, pill counts as well as use of [place] Controlled Substance Reporting system. A 12 month History has been reviewed on the [place] Controlled Substance Reporting System on 02/01/2023.   F/U in 6 months

## 2023-02-01 ENCOUNTER — Encounter: Payer: Medicare HMO | Attending: Registered Nurse | Admitting: Registered Nurse

## 2023-02-01 ENCOUNTER — Encounter: Payer: Self-pay | Admitting: Registered Nurse

## 2023-02-01 VITALS — BP 136/75 | HR 51 | Ht 67.4 in | Wt 177.0 lb

## 2023-02-01 DIAGNOSIS — G894 Chronic pain syndrome: Secondary | ICD-10-CM | POA: Diagnosis not present

## 2023-02-01 DIAGNOSIS — M069 Rheumatoid arthritis, unspecified: Secondary | ICD-10-CM | POA: Diagnosis not present

## 2023-02-01 DIAGNOSIS — Z5181 Encounter for therapeutic drug level monitoring: Secondary | ICD-10-CM | POA: Insufficient documentation

## 2023-02-01 DIAGNOSIS — Z79899 Other long term (current) drug therapy: Secondary | ICD-10-CM | POA: Insufficient documentation

## 2023-02-01 DIAGNOSIS — G609 Hereditary and idiopathic neuropathy, unspecified: Secondary | ICD-10-CM | POA: Diagnosis not present

## 2023-02-01 MED ORDER — TRAMADOL HCL 50 MG PO TABS: 50.0000 mg | ORAL_TABLET | Freq: Two times a day (BID) | ORAL | 0 refills | Status: DC | PRN

## 2023-02-06 ENCOUNTER — Encounter: Payer: Self-pay | Admitting: Registered Nurse

## 2023-03-12 ENCOUNTER — Other Ambulatory Visit: Payer: Self-pay

## 2023-03-12 MED ORDER — TRAMADOL HCL 50 MG PO TABS: 50.0000 mg | ORAL_TABLET | Freq: Two times a day (BID) | ORAL | 5 refills | Status: DC | PRN

## 2023-03-12 NOTE — Telephone Encounter (Signed)
Dr. Thelma Comp is off today. Please advise.   Patient reported having only 2 Tramadol tablets on hand. He forgot to call early. [facility] [place] Thank you.  Filled  Written  ID  Drug  QTY  Days  Prescriber  RX #  Dispenser  Refill  Daily Dose*  Pymt Type  PMP  02/10/2023 01/18/2023 1  Tramadol Hcl 50 Mg Tablet 60.00 30 Mo Eng [id number] Nor (6468) 0/0 20.00 MME Medicare [place] 01/11/2023 08/02/2022 1  Tramadol Hcl 50 Mg Tablet 60.00 30 Mo Eng [id number] Nor (6468) 5/5 20.00 MME Medicare [place]

## 2023-03-12 NOTE — Telephone Encounter (Signed)
PMP was Reviewed.  [facility], [name] was requesting refill too soon. They will fill his Tramadol today.  Call placed to [name] regarding the above, no answer.

## 2023-04-24 DIAGNOSIS — E663 Overweight: Secondary | ICD-10-CM | POA: Diagnosis not present

## 2023-04-24 DIAGNOSIS — Z6827 Body mass index (BMI) 27.0-27.9, adult: Secondary | ICD-10-CM | POA: Diagnosis not present

## 2023-04-24 DIAGNOSIS — M0579 Rheumatoid arthritis with rheumatoid factor of multiple sites without organ or systems involvement: Secondary | ICD-10-CM | POA: Diagnosis not present

## 2023-04-24 DIAGNOSIS — G629 Polyneuropathy, unspecified: Secondary | ICD-10-CM | POA: Diagnosis not present

## 2023-04-24 DIAGNOSIS — G894 Chronic pain syndrome: Secondary | ICD-10-CM | POA: Diagnosis not present

## 2023-04-24 DIAGNOSIS — M1991 Primary osteoarthritis, unspecified site: Secondary | ICD-10-CM | POA: Diagnosis not present

## 2023-04-24 DIAGNOSIS — Z79899 Other long term (current) drug therapy: Secondary | ICD-10-CM | POA: Diagnosis not present

## 2023-06-26 DIAGNOSIS — D472 Monoclonal gammopathy: Secondary | ICD-10-CM | POA: Diagnosis not present

## 2023-06-26 DIAGNOSIS — K909 Intestinal malabsorption, unspecified: Secondary | ICD-10-CM | POA: Diagnosis not present

## 2023-06-26 DIAGNOSIS — N1832 Chronic kidney disease, stage 3b: Secondary | ICD-10-CM | POA: Diagnosis not present

## 2023-07-05 DIAGNOSIS — H25813 Combined forms of age-related cataract, bilateral: Secondary | ICD-10-CM | POA: Diagnosis not present

## 2023-07-05 DIAGNOSIS — H43812 Vitreous degeneration, left eye: Secondary | ICD-10-CM | POA: Diagnosis not present

## 2023-07-05 DIAGNOSIS — H5203 Hypermetropia, bilateral: Secondary | ICD-10-CM | POA: Diagnosis not present

## 2023-07-05 DIAGNOSIS — H524 Presbyopia: Secondary | ICD-10-CM | POA: Diagnosis not present

## 2023-07-05 DIAGNOSIS — H52223 Regular astigmatism, bilateral: Secondary | ICD-10-CM | POA: Diagnosis not present

## 2023-07-05 DIAGNOSIS — H353131 Nonexudative age-related macular degeneration, bilateral, early dry stage: Secondary | ICD-10-CM | POA: Diagnosis not present

## 2023-07-24 DIAGNOSIS — M0579 Rheumatoid arthritis with rheumatoid factor of multiple sites without organ or systems involvement: Secondary | ICD-10-CM | POA: Diagnosis not present

## 2023-07-27 ENCOUNTER — Encounter: Payer: Medicare HMO | Admitting: Registered Nurse

## 2023-07-30 ENCOUNTER — Encounter: Payer: Self-pay | Admitting: Registered Nurse

## 2023-07-30 ENCOUNTER — Encounter: Attending: Registered Nurse | Admitting: Registered Nurse

## 2023-07-30 VITALS — BP 135/80 | HR 56 | Ht 67.4 in | Wt 178.0 lb

## 2023-07-30 DIAGNOSIS — Z79899 Other long term (current) drug therapy: Secondary | ICD-10-CM | POA: Insufficient documentation

## 2023-07-30 DIAGNOSIS — Z5181 Encounter for therapeutic drug level monitoring: Secondary | ICD-10-CM | POA: Diagnosis not present

## 2023-07-30 DIAGNOSIS — G894 Chronic pain syndrome: Secondary | ICD-10-CM | POA: Diagnosis not present

## 2023-07-30 DIAGNOSIS — R001 Bradycardia, unspecified: Secondary | ICD-10-CM | POA: Diagnosis not present

## 2023-07-30 DIAGNOSIS — M069 Rheumatoid arthritis, unspecified: Secondary | ICD-10-CM | POA: Insufficient documentation

## 2023-07-30 DIAGNOSIS — G609 Hereditary and idiopathic neuropathy, unspecified: Secondary | ICD-10-CM | POA: Diagnosis not present

## 2023-07-30 MED ORDER — TRAMADOL HCL 50 MG PO TABS: 50.0000 mg | ORAL_TABLET | Freq: Two times a day (BID) | ORAL | 5 refills | Status: DC | PRN

## 2023-07-30 NOTE — Progress Notes (Deleted)
 Subjective:    Patient ID: [name], male    DOB: 07-Jan-1941, 83 y.o.   MRN: [id number]  HPI   Pain Inventory Average Pain {NUMBERS; 0-10:5044} Pain Right Now {NUMBERS; 0-10:5044} My pain is {PAIN DESCRIPTION:21022940}  LOCATION OF PAIN  ***  BOWEL Number of stools per week: *** Oral laxative use {YES/NO:21197} Type of laxative *** Enema or suppository use {YES/NO:21197} History of colostomy {YES/NO:21197} Incontinent {YES/NO:21197}  BLADDER {bladder options:24190} In and out cath, frequency *** Able to self cath {YES/NO:21197} Bladder incontinence {YES/NO:21197} Frequent urination {YES/NO:21197} Leakage with coughing {YES/NO:21197} Difficulty starting stream {YES/NO:21197} Incomplete bladder emptying {YES/NO:21197}   Mobility {MOBILITY WGN:56213086}  Function {FUNCTION:21022946}  Neuro/Psych {NEURO/PSYCH:21022948}  Prior Studies {CPRM PRIOR STUDIES:21022953}  Physicians involved in your care {CPRM PHYSICIANS INVOLVED IN YOUR CARE:21022954}   Family History  Adopted: Yes  Problem Relation Age of Onset   Heart failure Mother    Heart failure Father    Social History   Socioeconomic History   Marital status: Married    Spouse name: Darel Hong   Number of children: 2   Years of education: 12   Highest education level: Not on file  Occupational History    Comment: Radio broadcast assistant, retired  Tobacco Use   Smoking status: Never   Smokeless tobacco: Never  Vaping Use   Vaping status: Never Used  Substance and Sexual Activity   Alcohol use: No   Drug use: No   Sexual activity: Not Currently  Other Topics Concern   Not on file  Social History Narrative   Lives at home with wife   Caffeine - Cokes, 1-2 weekly; coffee, 2-3 cups daily in winter   Social Drivers of Health   Financial Resource Strain: Not on file  Food Insecurity: Not on file  Transportation Needs: Not on file  Physical Activity: Not on file  Stress: Not on file  Social  Connections: Not on file   Past Surgical History:  Procedure Laterality Date   BALLOON DILATION N/A 10/01/2012   Procedure: BALLOON DILATION;  Surgeon: Charolett Bumpers, MD;  [facility];  Service: Endoscopy;  Laterality: N/A;   COLECTOMY N/A 06/10/2013   Procedure: TOTAL abdominal COLECTOMY;  Surgeon: Adolph Pollack, MD;  [facility];  Service: General;  Laterality: N/A;   COLONOSCOPY     CYSTOSCOPY WITH URETEROSCOPY Right 12/05/2012   Procedure: CYSTOSCOPY WITH RIGHT URETEROSCOPY AND STONE EXTRACTION;  Surgeon: Anner Crete, MD;  [facility];  Service: Urology;  Laterality: Right;   ESOPHAGOGASTRODUODENOSCOPY N/A 10/01/2012   Procedure: ESOPHAGOGASTRODUODENOSCOPY (EGD);  Surgeon: Charolett Bumpers, MD;  Location: [facility];  Service: Endoscopy;  Laterality: N/A;   HERNIA REPAIR     INGUINAL HERNIA REPAIR  2007   Bilateral    Lithotripsy for nephrolithiasis     Pt and wife not sure when but here in [place]   Lysis of Adhesion, small bowel resection  2009   MAXIMUM ACCESS (MAS)POSTERIOR LUMBAR INTERBODY FUSION (PLIF) 1 LEVEL N/A 05/08/2016   Procedure: LUMBAR FOUR-FIVE FOR MAXIMUM ACCESS (MAS) POSTERIOR LUMBAR INTERBODY FUSION;  Surgeon: Donalee Citrin, MD;  [facility];  Service: Neurosurgery;  Laterality: N/A;   NISSEN FUNDOPLICATION     about 2005, pt not sure records not currently available   TOTAL KNEE ARTHROPLASTY  2003   Bilateral   TRANSURETHRAL PROSTATECTOMY WITH GYRUS INSTRUMENTS N/A 12/05/2012   Procedure: TRANSURETHRAL PROSTATECTOMY WITH GYRUS INSTRUMENTS;  Surgeon: Anner Crete, MD;  [facility];  Service: Urology;  Laterality: N/A;   Ventral Hernia Repair with Mesh     Past Medical History:  Diagnosis Date   Blood transfusion    " no reaction from transfusion"   BPH (benign prostatic hyperplasia)    Bruises easily    Bursitis    chronic hip pain   Cancer (HCC)    prostate cancer cells   Disc disease, degenerative, lumbar or lumbosacral     Chronic back pain now   Diverticulitis    Esophageal ring    requiring dilations    GERD (gastroesophageal reflux disease)    History of GI bleed    "vessels burst in colon" x2   History of kidney stones    chronic   History of vertigo    Neuropathy    Osteoarthritis    Osteoporosis    Pneumonia    yrs ago   Rash    GROIN - ITCHING PAST 3 MONTHS - it is gone now 05/02/16   Rheumatoid arthritis(714.0)    27 years   Spondylisthesis    Stroke (HCC) 07/2016   Ureteral stone 11/2012   There were no vitals taken for this visit.  Opioid Risk Score:   Fall Risk Score:  `1  Depression screen [facility] 2/9     02/01/2023    9:25 AM 08/02/2022    9:15 AM 01/30/2022    8:53 AM 12/12/2021    9:14 AM 08/29/2016   10:48 AM  Depression screen PHQ 2/9  Decreased Interest 0 0 0 1 0  Down, Depressed, Hopeless 0 0 0 0 0  PHQ - 2 Score 0 0 0 1 0  Altered sleeping  0  0 0  Tired, decreased energy    1 2  Change in appetite    0 0  Feeling bad or failure about yourself     0 0  Trouble concentrating    0 0  Moving slowly or fidgety/restless    0 0  Suicidal thoughts    0 0  PHQ-9 Score  0  2 2  Difficult doing work/chores     Somewhat difficult    Review of Systems     Objective:   Physical Exam        Assessment & Plan:

## 2023-07-30 NOTE — Progress Notes (Unsigned)
 Subjective:    Patient ID: [name], male    DOB: 08-09-1940, 83 y.o.   MRN: [id number]  HPI: [name] is a 83 y.o. male who returns for follow up appointment for chronic pain and medication refill. states *** pain is located in  ***. rates pain ***. current exercise regime is walking and performing stretching exercises.  [name] Morphine equivalent is 20.00 MME.   UDS ordered today.      Pain Inventory Average Pain 8 Pain Right Now 8 My pain is intermittent and tingling  In the last 24 hours, has pain interfered with the following? General activity 5 Relation with others 0 Enjoyment of life 6 What TIME of day is your pain at its worst? evening Sleep (in general) Good  Pain is worse with: walking Pain improves with: medication Relief from Meds: 8  Family History  Adopted: Yes  Problem Relation Age of Onset   Heart failure Mother    Heart failure Father    Social History   Socioeconomic History   Marital status: Married    Spouse name: Darel Hong   Number of children: 2   Years of education: 12   Highest education level: Not on file  Occupational History    Comment: Radio broadcast assistant, retired  Tobacco Use   Smoking status: Never   Smokeless tobacco: Never  Vaping Use   Vaping status: Never Used  Substance and Sexual Activity   Alcohol use: No   Drug use: No   Sexual activity: Not Currently  Other Topics Concern   Not on file  Social History Narrative   Lives at home with wife   Caffeine - Cokes, 1-2 weekly; coffee, 2-3 cups daily in winter   Social Drivers of Health   Financial Resource Strain: Not on file  Food Insecurity: Not on file  Transportation Needs: Not on file  Physical Activity: Not on file  Stress: Not on file  Social Connections: Not on file   Past Surgical History:  Procedure Laterality Date   BALLOON DILATION N/A 10/01/2012   Procedure: BALLOON DILATION;  Surgeon: Charolett Bumpers, MD;  [facility];  Service:  Endoscopy;  Laterality: N/A;   COLECTOMY N/A 06/10/2013   Procedure: TOTAL abdominal COLECTOMY;  Surgeon: Adolph Pollack, MD;  [facility];  Service: General;  Laterality: N/A;   COLONOSCOPY     CYSTOSCOPY WITH URETEROSCOPY Right 12/05/2012   Procedure: CYSTOSCOPY WITH RIGHT URETEROSCOPY AND STONE EXTRACTION;  Surgeon: Anner Crete, MD;  [facility];  Service: Urology;  Laterality: Right;   ESOPHAGOGASTRODUODENOSCOPY N/A 10/01/2012   Procedure: ESOPHAGOGASTRODUODENOSCOPY (EGD);  Surgeon: Charolett Bumpers, MD;  Location: [facility];  Service: Endoscopy;  Laterality: N/A;   HERNIA REPAIR     INGUINAL HERNIA REPAIR  2007   Bilateral    Lithotripsy for nephrolithiasis     Pt and wife not sure when but here in [place]   Lysis of Adhesion, small bowel resection  2009   MAXIMUM ACCESS (MAS)POSTERIOR LUMBAR INTERBODY FUSION (PLIF) 1 LEVEL N/A 05/08/2016   Procedure: LUMBAR FOUR-FIVE FOR MAXIMUM ACCESS (MAS) POSTERIOR LUMBAR INTERBODY FUSION;  Surgeon: Donalee Citrin, MD;  [facility];  Service: Neurosurgery;  Laterality: N/A;   NISSEN FUNDOPLICATION     about 2005, pt not sure records not currently available   TOTAL KNEE ARTHROPLASTY  2003   Bilateral   TRANSURETHRAL PROSTATECTOMY WITH GYRUS INSTRUMENTS N/A 12/05/2012   Procedure: TRANSURETHRAL PROSTATECTOMY WITH GYRUS INSTRUMENTS;  Surgeon: Willye Harvey, MD;  [facility];  Service: Urology;  Laterality: N/A;   Ventral Hernia Repair with Mesh     Past Surgical History:  Procedure Laterality Date   BALLOON DILATION N/A 10/01/2012   Procedure: BALLOON DILATION;  Surgeon: Garrett Kallman, MD;  [facility];  Service: Endoscopy;  Laterality: N/A;   COLECTOMY N/A 06/10/2013   Procedure: TOTAL abdominal COLECTOMY;  Surgeon: Harlee Lichtenstein, MD;  [facility];  Service: General;  Laterality: N/A;   COLONOSCOPY     CYSTOSCOPY WITH URETEROSCOPY Right 12/05/2012   Procedure: CYSTOSCOPY WITH RIGHT URETEROSCOPY AND STONE  EXTRACTION;  Surgeon: Willye Harvey, MD;  [facility];  Service: Urology;  Laterality: Right;   ESOPHAGOGASTRODUODENOSCOPY N/A 10/01/2012   Procedure: ESOPHAGOGASTRODUODENOSCOPY (EGD);  Surgeon: Garrett Kallman, MD;  Location: [facility];  Service: Endoscopy;  Laterality: N/A;   HERNIA REPAIR     INGUINAL HERNIA REPAIR  2007   Bilateral    Lithotripsy for nephrolithiasis     Pt and wife not sure when but here in [place]   Lysis of Adhesion, small bowel resection  2009   MAXIMUM ACCESS (MAS)POSTERIOR LUMBAR INTERBODY FUSION (PLIF) 1 LEVEL N/A 05/08/2016   Procedure: LUMBAR FOUR-FIVE FOR MAXIMUM ACCESS (MAS) POSTERIOR LUMBAR INTERBODY FUSION;  Surgeon: Gearl Keens, MD;  [facility];  Service: Neurosurgery;  Laterality: N/A;   NISSEN FUNDOPLICATION     about 2005, pt not sure records not currently available   TOTAL KNEE ARTHROPLASTY  2003   Bilateral   TRANSURETHRAL PROSTATECTOMY WITH GYRUS INSTRUMENTS N/A 12/05/2012   Procedure: TRANSURETHRAL PROSTATECTOMY WITH GYRUS INSTRUMENTS;  Surgeon: Willye Harvey, MD;  [facility];  Service: Urology;  Laterality: N/A;   Ventral Hernia Repair with Mesh     Past Medical History:  Diagnosis Date   Blood transfusion    " no reaction from transfusion"   BPH (benign prostatic hyperplasia)    Bruises easily    Bursitis    chronic hip pain   Cancer (HCC)    prostate cancer cells   Disc disease, degenerative, lumbar or lumbosacral    Chronic back pain now   Diverticulitis    Esophageal ring    requiring dilations    GERD (gastroesophageal reflux disease)    History of GI bleed    "vessels burst in colon" x2   History of kidney stones    chronic   History of vertigo    Neuropathy    Osteoarthritis    Osteoporosis    Pneumonia    yrs ago   Rash    GROIN - ITCHING PAST 3 MONTHS - it is gone now 05/02/16   Rheumatoid arthritis(714.0)    27 years   Spondylisthesis    Stroke (HCC) 07/2016   Ureteral stone 11/2012   BP (!) 142/84    Pulse (!) 57   Ht 5' 7.4" (1.712 m)   Wt 178 lb (80.7 kg)   SpO2 98%   BMI 27.55 kg/m   Opioid Risk Score:   Fall Risk Score:  `1  Depression screen [facility] 2/9     02/01/2023    9:25 AM 08/02/2022    9:15 AM 01/30/2022    8:53 AM 12/12/2021    9:14 AM 08/29/2016   10:48 AM  Depression screen PHQ 2/9  Decreased Interest 0 0 0 1 0  Down, Depressed, Hopeless 0 0 0 0 0  PHQ - 2 Score 0 0 0  1 0  Altered sleeping  0  0 0  Tired, decreased energy    1 2  Change in appetite    0 0  Feeling bad or failure about yourself     0 0  Trouble concentrating    0 0  Moving slowly or fidgety/restless    0 0  Suicidal thoughts    0 0  PHQ-9 Score  0  2 2  Difficult doing work/chores     Somewhat difficult    Review of Systems  Musculoskeletal:        Pain in right hand & right foot  All other systems reviewed and are negative.      Objective:   Physical Exam        Assessment & Plan:  Rheumatoid Arthritis: Continue current medication regimen. Continue HEP as Tolerated. Continue to Monitor.  Hereditary and Idiopathic Neuropathy: Continue current medication regimen. Continue to Monitor.  Chronic Pain Syndrome: Refilled:  Tramadol 50 mg one tablet twice a day as needed for pain #60. We will continue the opioid monitoring program, this consists of regular clinic visits, examinations, urine drug screen, pill counts as well as use of [place]  Controlled Substance Reporting system. A 12 month History has been reviewed on the [place]  Controlled Substance Reporting System on 02/01/2023.    F/U in 6 months

## 2023-08-02 LAB — DRUG TOX MONITOR 1 W/CONF, ORAL FLD

## 2023-08-02 LAB — DRUG TOX ALC METAB W/CON, ORAL FLD: Alcohol Metabolite: NEGATIVE ng/mL (ref <25)

## 2023-08-15 DIAGNOSIS — H43812 Vitreous degeneration, left eye: Secondary | ICD-10-CM | POA: Diagnosis not present

## 2023-08-15 DIAGNOSIS — H25813 Combined forms of age-related cataract, bilateral: Secondary | ICD-10-CM | POA: Diagnosis not present

## 2023-08-15 DIAGNOSIS — H353131 Nonexudative age-related macular degeneration, bilateral, early dry stage: Secondary | ICD-10-CM | POA: Diagnosis not present

## 2023-08-15 DIAGNOSIS — H31092 Other chorioretinal scars, left eye: Secondary | ICD-10-CM | POA: Diagnosis not present

## 2023-08-27 DIAGNOSIS — H25812 Combined forms of age-related cataract, left eye: Secondary | ICD-10-CM | POA: Diagnosis not present

## 2023-09-11 DIAGNOSIS — H25812 Combined forms of age-related cataract, left eye: Secondary | ICD-10-CM | POA: Diagnosis not present

## 2023-09-11 DIAGNOSIS — H268 Other specified cataract: Secondary | ICD-10-CM | POA: Diagnosis not present

## 2023-09-19 DIAGNOSIS — H25811 Combined forms of age-related cataract, right eye: Secondary | ICD-10-CM | POA: Diagnosis not present

## 2023-09-25 DIAGNOSIS — H268 Other specified cataract: Secondary | ICD-10-CM | POA: Diagnosis not present

## 2023-09-25 DIAGNOSIS — I4891 Unspecified atrial fibrillation: Secondary | ICD-10-CM | POA: Diagnosis not present

## 2023-09-25 DIAGNOSIS — H25811 Combined forms of age-related cataract, right eye: Secondary | ICD-10-CM | POA: Diagnosis not present

## 2023-10-16 DIAGNOSIS — G894 Chronic pain syndrome: Secondary | ICD-10-CM | POA: Diagnosis not present

## 2023-10-16 DIAGNOSIS — R21 Rash and other nonspecific skin eruption: Secondary | ICD-10-CM | POA: Diagnosis not present

## 2023-10-16 DIAGNOSIS — E663 Overweight: Secondary | ICD-10-CM | POA: Diagnosis not present

## 2023-10-16 DIAGNOSIS — M0579 Rheumatoid arthritis with rheumatoid factor of multiple sites without organ or systems involvement: Secondary | ICD-10-CM | POA: Diagnosis not present

## 2023-10-16 DIAGNOSIS — Z6827 Body mass index (BMI) 27.0-27.9, adult: Secondary | ICD-10-CM | POA: Diagnosis not present

## 2023-10-16 DIAGNOSIS — Z79899 Other long term (current) drug therapy: Secondary | ICD-10-CM | POA: Diagnosis not present

## 2023-11-02 DIAGNOSIS — H524 Presbyopia: Secondary | ICD-10-CM | POA: Diagnosis not present

## 2023-11-02 DIAGNOSIS — H52223 Regular astigmatism, bilateral: Secondary | ICD-10-CM | POA: Diagnosis not present

## 2023-11-15 DIAGNOSIS — I509 Heart failure, unspecified: Secondary | ICD-10-CM | POA: Diagnosis not present

## 2023-11-15 DIAGNOSIS — Z8546 Personal history of malignant neoplasm of prostate: Secondary | ICD-10-CM | POA: Diagnosis not present

## 2023-11-15 DIAGNOSIS — N1831 Chronic kidney disease, stage 3a: Secondary | ICD-10-CM | POA: Diagnosis not present

## 2023-11-15 DIAGNOSIS — J439 Emphysema, unspecified: Secondary | ICD-10-CM | POA: Diagnosis not present

## 2023-12-16 DIAGNOSIS — I509 Heart failure, unspecified: Secondary | ICD-10-CM | POA: Diagnosis not present

## 2023-12-16 DIAGNOSIS — Z8546 Personal history of malignant neoplasm of prostate: Secondary | ICD-10-CM | POA: Diagnosis not present

## 2023-12-16 DIAGNOSIS — J439 Emphysema, unspecified: Secondary | ICD-10-CM | POA: Diagnosis not present

## 2023-12-16 DIAGNOSIS — N1831 Chronic kidney disease, stage 3a: Secondary | ICD-10-CM | POA: Diagnosis not present

## 2024-01-07 DIAGNOSIS — D500 Iron deficiency anemia secondary to blood loss (chronic): Secondary | ICD-10-CM | POA: Diagnosis not present

## 2024-01-15 DIAGNOSIS — I509 Heart failure, unspecified: Secondary | ICD-10-CM | POA: Diagnosis not present

## 2024-01-15 DIAGNOSIS — Z8546 Personal history of malignant neoplasm of prostate: Secondary | ICD-10-CM | POA: Diagnosis not present

## 2024-01-15 DIAGNOSIS — J439 Emphysema, unspecified: Secondary | ICD-10-CM | POA: Diagnosis not present

## 2024-01-15 DIAGNOSIS — N1831 Chronic kidney disease, stage 3a: Secondary | ICD-10-CM | POA: Diagnosis not present

## 2024-01-16 DIAGNOSIS — M0579 Rheumatoid arthritis with rheumatoid factor of multiple sites without organ or systems involvement: Secondary | ICD-10-CM | POA: Diagnosis not present

## 2024-01-23 DIAGNOSIS — R195 Other fecal abnormalities: Secondary | ICD-10-CM | POA: Diagnosis not present

## 2024-01-23 DIAGNOSIS — D649 Anemia, unspecified: Secondary | ICD-10-CM | POA: Diagnosis not present

## 2024-01-23 DIAGNOSIS — Z9049 Acquired absence of other specified parts of digestive tract: Secondary | ICD-10-CM | POA: Diagnosis not present

## 2024-01-23 DIAGNOSIS — D509 Iron deficiency anemia, unspecified: Secondary | ICD-10-CM | POA: Diagnosis not present

## 2024-01-23 DIAGNOSIS — K921 Melena: Secondary | ICD-10-CM | POA: Diagnosis not present

## 2024-01-23 DIAGNOSIS — Z8719 Personal history of other diseases of the digestive system: Secondary | ICD-10-CM | POA: Diagnosis not present

## 2024-02-05 ENCOUNTER — Encounter: Payer: Self-pay | Admitting: Registered Nurse

## 2024-02-05 ENCOUNTER — Encounter: Attending: Registered Nurse | Admitting: Registered Nurse

## 2024-02-05 VITALS — BP 138/77 | HR 62 | Ht 67.4 in | Wt 178.4 lb

## 2024-02-05 DIAGNOSIS — Z79899 Other long term (current) drug therapy: Secondary | ICD-10-CM | POA: Diagnosis not present

## 2024-02-05 DIAGNOSIS — M2550 Pain in unspecified joint: Secondary | ICD-10-CM | POA: Insufficient documentation

## 2024-02-05 DIAGNOSIS — G609 Hereditary and idiopathic neuropathy, unspecified: Secondary | ICD-10-CM | POA: Diagnosis not present

## 2024-02-05 DIAGNOSIS — G894 Chronic pain syndrome: Secondary | ICD-10-CM | POA: Diagnosis not present

## 2024-02-05 DIAGNOSIS — Z5181 Encounter for therapeutic drug level monitoring: Secondary | ICD-10-CM | POA: Insufficient documentation

## 2024-02-05 MED ORDER — TRAMADOL HCL 50 MG PO TABS
50.0000 mg | ORAL_TABLET | Freq: Two times a day (BID) | ORAL | 5 refills | Status: AC | PRN
Start: 2024-02-05 — End: ?

## 2024-02-05 NOTE — Progress Notes (Signed)
 Subjective:    Patient ID: [name], male    DOB: 12-26-1940, 83 y.o.   MRN: [id number]  HPI: [name] is a 83 y.o. male who returns for follow up appointment for chronic pain and medication refill. He states his pain is located in his bilateral hands ( reports joint pain) and bilateral feet with tingling and burning.SABRAHe rates his pain 3. His current exercise regime is walking and performing stretching exercises.  [name] [name] Morphine  equivalent is 20.00 MME.   Oral Swab was Performed today.      Pain Inventory Average Pain 4 Pain Right Now 3 My pain is intermittent and aching  In the last 24 hours, has pain interfered with the following? General activity 4 Relation with others 3 Enjoyment of life 5 What TIME of day is your pain at its worst? evening Sleep (in general) Good  Pain is worse with: walking, standing, and some activites Pain improves with: rest Relief from Meds: 5  Family History  Adopted: Yes  Problem Relation Age of Onset   Heart failure Mother    Heart failure Father    Social History   Socioeconomic History   Marital status: Married    Spouse name: Dagoberto   Number of children: 2   Years of education: 12   Highest education level: Not on file  Occupational History    Comment: Radio broadcast assistant, retired  Tobacco Use   Smoking status: Never   Smokeless tobacco: Never  Vaping Use   Vaping status: Never Used  Substance and Sexual Activity   Alcohol use: No   Drug use: No   Sexual activity: Not Currently  Other Topics Concern   Not on file  Social History Narrative   Lives at home with wife   Caffeine - Cokes, 1-2 weekly; coffee, 2-3 cups daily in winter   Social Drivers of Health   Financial Resource Strain: Not on file  Food Insecurity: Not on file  Transportation Needs: Not on file  Physical Activity: Not on file  Stress: Not on file  Social Connections: Not on file   Past Surgical History:  Procedure  Laterality Date   BALLOON DILATION N/A 10/01/2012   Procedure: BALLOON DILATION;  Surgeon: Gladis MARLA Louder, MD;  [facility];  Service: Endoscopy;  Laterality: N/A;   COLECTOMY N/A 06/10/2013   Procedure: TOTAL abdominal COLECTOMY;  Surgeon: Krystal JINNY Russell, MD;  [facility];  Service: General;  Laterality: N/A;   COLONOSCOPY     CYSTOSCOPY WITH URETEROSCOPY Right 12/05/2012   Procedure: CYSTOSCOPY WITH RIGHT URETEROSCOPY AND STONE EXTRACTION;  Surgeon: Norleen JINNY Seltzer, MD;  [facility];  Service: Urology;  Laterality: Right;   ESOPHAGOGASTRODUODENOSCOPY N/A 10/01/2012   Procedure: ESOPHAGOGASTRODUODENOSCOPY (EGD);  Surgeon: Gladis MARLA Louder, MD;  Location: [facility];  Service: Endoscopy;  Laterality: N/A;   HERNIA REPAIR     INGUINAL HERNIA REPAIR  2007   Bilateral    Lithotripsy for nephrolithiasis     Pt and wife not sure when but here in [place]   Lysis of Adhesion, small bowel resection  2009   MAXIMUM ACCESS (MAS)POSTERIOR LUMBAR INTERBODY FUSION (PLIF) 1 LEVEL N/A 05/08/2016   Procedure: LUMBAR FOUR-FIVE FOR MAXIMUM ACCESS (MAS) POSTERIOR LUMBAR INTERBODY FUSION;  Surgeon: Arley Helling, MD;  [facility];  Service: Neurosurgery;  Laterality: N/A;   NISSEN FUNDOPLICATION     about 2005, pt not sure records not currently available   TOTAL  KNEE ARTHROPLASTY  2003   Bilateral   TRANSURETHRAL PROSTATECTOMY WITH GYRUS INSTRUMENTS N/A 12/05/2012   Procedure: TRANSURETHRAL PROSTATECTOMY WITH GYRUS INSTRUMENTS;  Surgeon: Norleen JINNY Seltzer, MD;  [facility];  Service: Urology;  Laterality: N/A;   Ventral Hernia Repair with Mesh     Past Surgical History:  Procedure Laterality Date   BALLOON DILATION N/A 10/01/2012   Procedure: BALLOON DILATION;  Surgeon: Gladis MARLA Louder, MD;  [facility];  Service: Endoscopy;  Laterality: N/A;   COLECTOMY N/A 06/10/2013   Procedure: TOTAL abdominal COLECTOMY;  Surgeon: Krystal JINNY Russell, MD;  [facility];  Service:  General;  Laterality: N/A;   COLONOSCOPY     CYSTOSCOPY WITH URETEROSCOPY Right 12/05/2012   Procedure: CYSTOSCOPY WITH RIGHT URETEROSCOPY AND STONE EXTRACTION;  Surgeon: Norleen JINNY Seltzer, MD;  [facility];  Service: Urology;  Laterality: Right;   ESOPHAGOGASTRODUODENOSCOPY N/A 10/01/2012   Procedure: ESOPHAGOGASTRODUODENOSCOPY (EGD);  Surgeon: Gladis MARLA Louder, MD;  Location: [facility];  Service: Endoscopy;  Laterality: N/A;   HERNIA REPAIR     INGUINAL HERNIA REPAIR  2007   Bilateral    Lithotripsy for nephrolithiasis     Pt and wife not sure when but here in [place]   Lysis of Adhesion, small bowel resection  2009   MAXIMUM ACCESS (MAS)POSTERIOR LUMBAR INTERBODY FUSION (PLIF) 1 LEVEL N/A 05/08/2016   Procedure: LUMBAR FOUR-FIVE FOR MAXIMUM ACCESS (MAS) POSTERIOR LUMBAR INTERBODY FUSION;  Surgeon: Arley Helling, MD;  [facility];  Service: Neurosurgery;  Laterality: N/A;   NISSEN FUNDOPLICATION     about 2005, pt not sure records not currently available   TOTAL KNEE ARTHROPLASTY  2003   Bilateral   TRANSURETHRAL PROSTATECTOMY WITH GYRUS INSTRUMENTS N/A 12/05/2012   Procedure: TRANSURETHRAL PROSTATECTOMY WITH GYRUS INSTRUMENTS;  Surgeon: Norleen JINNY Seltzer, MD;  [facility];  Service: Urology;  Laterality: N/A;   Ventral Hernia Repair with Mesh     Past Medical History:  Diagnosis Date   Blood transfusion     no reaction from transfusion   BPH (benign prostatic hyperplasia)    Bruises easily    Bursitis    chronic hip pain   Cancer (HCC)    prostate cancer cells   Disc disease, degenerative, lumbar or lumbosacral    Chronic back pain now   Diverticulitis    Esophageal ring    requiring dilations    GERD (gastroesophageal reflux disease)    History of GI bleed    vessels burst in colon x2   History of kidney stones    chronic   History of vertigo    Neuropathy    Osteoarthritis    Osteoporosis    Pneumonia    yrs ago   Rash    GROIN - ITCHING PAST 3 MONTHS - it  is gone now 05/02/16   Rheumatoid arthritis(714.0)    27 years   Spondylisthesis    Stroke (HCC) 07/2016   Ureteral stone 11/2012   BP 138/77 (BP Location: Left Arm, Patient Position: Sitting, Cuff Size: Normal)   Pulse 62   Ht 5' 7.4 (1.712 m)   Wt 178 lb 6.4 oz (80.9 kg)   SpO2 93%   BMI 27.61 kg/m   Opioid Risk Score:   Fall Risk Score:  `1  Depression screen [facility] 2/9     07/30/2023   10:09 AM 02/01/2023    9:25 AM 08/02/2022    9:15 AM 01/30/2022    8:53 AM 12/12/2021  9:14 AM 08/29/2016   10:48 AM  Depression screen PHQ 2/9  Decreased Interest 0 0 0 0 1 0  Down, Depressed, Hopeless 0 0 0 0 0 0  PHQ - 2 Score 0 0 0 0 1 0  Altered sleeping   0  0 0  Tired, decreased energy     1 2  Change in appetite     0 0  Feeling bad or failure about yourself      0 0  Trouble concentrating     0 0  Moving slowly or fidgety/restless     0 0  Suicidal thoughts     0 0   PHQ-9 Score   0  2 2  Difficult doing work/chores      Somewhat difficult     Data saved with a previous flowsheet row definition      Review of Systems  Musculoskeletal:        Bilateral hand pain (neuropathy)  All other systems reviewed and are negative.      Objective:   Physical Exam Vitals and nursing note reviewed.  Constitutional:      Appearance: Normal appearance.  Cardiovascular:     Rate and Rhythm: Normal rate and regular rhythm.     Pulses: Normal pulses.     Heart sounds: Normal heart sounds.  Pulmonary:     Effort: Pulmonary effort is normal.     Breath sounds: Normal breath sounds.  Musculoskeletal:     Comments: Normal Muscle Bulk and Muscle Testing Reveals:  Upper Extremities: Full ROM and Muscle Strength 5/5 Lower Extremities: Full ROM and Muscle Strength 5/5 Arises from table with ease Narrow Based Gait     Skin:    General: Skin is warm and dry.  Neurological:     Mental Status: He is alert and oriented to person, place, and time.  Psychiatric:        Mood and Affect:  Mood normal.        Behavior: Behavior normal.          Assessment & Plan:  Rheumatoid Arthritis: Continue current medication regimen. Continue HEP as Tolerated. Continue to Monitor. 02/05/2024 Hereditary and Idiopathic Neuropathy: Continue current medication regimen. Continue to Monitor. 02/05/2024 Chronic Pain Syndrome: Refilled:  Tramadol  50 mg one tablet twice a day as needed for pain #60. We will continue the opioid monitoring program, this consists of regular clinic visits, examinations, urine drug screen, pill counts as well as use of [place]  Controlled Substance Reporting system. A 12 month History has been reviewed on the [place]  Controlled Substance Reporting System on 02/05/2024    F/U in 6 months

## 2024-02-08 LAB — DRUG TOX MONITOR 1 W/CONF, ORAL FLD

## 2024-02-08 LAB — DRUG TOX ALC METAB W/CON, ORAL FLD: Alcohol Metabolite: NEGATIVE ng/mL (ref <25)

## 2024-02-15 DIAGNOSIS — I509 Heart failure, unspecified: Secondary | ICD-10-CM | POA: Diagnosis not present

## 2024-02-15 DIAGNOSIS — N1831 Chronic kidney disease, stage 3a: Secondary | ICD-10-CM | POA: Diagnosis not present

## 2024-02-15 DIAGNOSIS — J439 Emphysema, unspecified: Secondary | ICD-10-CM | POA: Diagnosis not present

## 2024-02-15 DIAGNOSIS — Z8546 Personal history of malignant neoplasm of prostate: Secondary | ICD-10-CM | POA: Diagnosis not present

## 2024-02-20 DIAGNOSIS — R195 Other fecal abnormalities: Secondary | ICD-10-CM | POA: Diagnosis not present

## 2024-02-20 DIAGNOSIS — K2930 Chronic superficial gastritis without bleeding: Secondary | ICD-10-CM | POA: Diagnosis not present

## 2024-02-20 DIAGNOSIS — K222 Esophageal obstruction: Secondary | ICD-10-CM | POA: Diagnosis not present

## 2024-02-20 DIAGNOSIS — D509 Iron deficiency anemia, unspecified: Secondary | ICD-10-CM | POA: Diagnosis not present

## 2024-02-20 DIAGNOSIS — Z980 Intestinal bypass and anastomosis status: Secondary | ICD-10-CM | POA: Diagnosis not present

## 2024-02-20 DIAGNOSIS — K224 Dyskinesia of esophagus: Secondary | ICD-10-CM | POA: Diagnosis not present

## 2024-02-20 DIAGNOSIS — K2950 Unspecified chronic gastritis without bleeding: Secondary | ICD-10-CM | POA: Diagnosis not present

## 2024-02-20 DIAGNOSIS — K449 Diaphragmatic hernia without obstruction or gangrene: Secondary | ICD-10-CM | POA: Diagnosis not present

## 2024-02-20 DIAGNOSIS — K648 Other hemorrhoids: Secondary | ICD-10-CM | POA: Diagnosis not present

## 2024-02-22 DIAGNOSIS — K2930 Chronic superficial gastritis without bleeding: Secondary | ICD-10-CM | POA: Diagnosis not present

## 2024-03-16 DIAGNOSIS — J439 Emphysema, unspecified: Secondary | ICD-10-CM | POA: Diagnosis not present

## 2024-03-16 DIAGNOSIS — Z8546 Personal history of malignant neoplasm of prostate: Secondary | ICD-10-CM | POA: Diagnosis not present

## 2024-03-16 DIAGNOSIS — I509 Heart failure, unspecified: Secondary | ICD-10-CM | POA: Diagnosis not present

## 2024-03-16 DIAGNOSIS — N1831 Chronic kidney disease, stage 3a: Secondary | ICD-10-CM | POA: Diagnosis not present

## 2024-07-31 ENCOUNTER — Encounter: Admitting: Registered Nurse
